# Patient Record
Sex: Male | Born: 1940 | Race: White | Hispanic: No | Smoking: Former smoker
Health system: Southern US, Community
[De-identification: ages and names within clinical notes are randomized; demographics above are authoritative.]

## PROBLEM LIST (undated history)

## (undated) DIAGNOSIS — I10 Essential (primary) hypertension: Secondary | ICD-10-CM

## (undated) DIAGNOSIS — I471 Supraventricular tachycardia, unspecified: Secondary | ICD-10-CM

## (undated) DIAGNOSIS — Z7901 Long term (current) use of anticoagulants: Secondary | ICD-10-CM

## (undated) DIAGNOSIS — I509 Heart failure, unspecified: Secondary | ICD-10-CM

## (undated) DIAGNOSIS — M1990 Unspecified osteoarthritis, unspecified site: Secondary | ICD-10-CM

## (undated) DIAGNOSIS — I82409 Acute embolism and thrombosis of unspecified deep veins of unspecified lower extremity: Secondary | ICD-10-CM

## (undated) DIAGNOSIS — Z87442 Personal history of urinary calculi: Secondary | ICD-10-CM

## (undated) DIAGNOSIS — I2699 Other pulmonary embolism without acute cor pulmonale: Secondary | ICD-10-CM

## (undated) DIAGNOSIS — K219 Gastro-esophageal reflux disease without esophagitis: Secondary | ICD-10-CM

## (undated) DIAGNOSIS — I429 Cardiomyopathy, unspecified: Secondary | ICD-10-CM

## (undated) DIAGNOSIS — E785 Hyperlipidemia, unspecified: Secondary | ICD-10-CM

## (undated) DIAGNOSIS — I2510 Atherosclerotic heart disease of native coronary artery without angina pectoris: Secondary | ICD-10-CM

## (undated) DIAGNOSIS — Z95810 Presence of automatic (implantable) cardiac defibrillator: Secondary | ICD-10-CM

## (undated) DIAGNOSIS — D329 Benign neoplasm of meninges, unspecified: Secondary | ICD-10-CM

## (undated) DIAGNOSIS — Z8711 Personal history of peptic ulcer disease: Secondary | ICD-10-CM

## (undated) DIAGNOSIS — I495 Sick sinus syndrome: Secondary | ICD-10-CM

## (undated) DIAGNOSIS — I739 Peripheral vascular disease, unspecified: Secondary | ICD-10-CM

## (undated) DIAGNOSIS — I6529 Occlusion and stenosis of unspecified carotid artery: Secondary | ICD-10-CM

## (undated) DIAGNOSIS — C4491 Basal cell carcinoma of skin, unspecified: Secondary | ICD-10-CM

## (undated) DIAGNOSIS — R51 Headache: Secondary | ICD-10-CM

## (undated) DIAGNOSIS — IMO0001 Reserved for inherently not codable concepts without codable children: Secondary | ICD-10-CM

## (undated) DIAGNOSIS — Z8719 Personal history of other diseases of the digestive system: Secondary | ICD-10-CM

## (undated) DIAGNOSIS — G4733 Obstructive sleep apnea (adult) (pediatric): Secondary | ICD-10-CM

## (undated) DIAGNOSIS — Z8679 Personal history of other diseases of the circulatory system: Secondary | ICD-10-CM

## (undated) DIAGNOSIS — R519 Headache, unspecified: Secondary | ICD-10-CM

## (undated) DIAGNOSIS — I4891 Unspecified atrial fibrillation: Secondary | ICD-10-CM

## (undated) DIAGNOSIS — G459 Transient cerebral ischemic attack, unspecified: Secondary | ICD-10-CM

## (undated) DIAGNOSIS — K2270 Barrett's esophagus without dysplasia: Secondary | ICD-10-CM

## (undated) DIAGNOSIS — I672 Cerebral atherosclerosis: Secondary | ICD-10-CM

## (undated) DIAGNOSIS — I480 Paroxysmal atrial fibrillation: Secondary | ICD-10-CM

## (undated) DIAGNOSIS — H538 Other visual disturbances: Secondary | ICD-10-CM

## (undated) HISTORY — PX: INGUINAL HERNIA REPAIR: SUR1180

## (undated) HISTORY — DX: Supraventricular tachycardia: I47.1

## (undated) HISTORY — DX: Other visual disturbances: H53.8

## (undated) HISTORY — DX: Obstructive sleep apnea (adult) (pediatric): G47.33

## (undated) HISTORY — PX: THROAT SURGERY: SHX803

## (undated) HISTORY — PX: CORONARY ANGIOPLASTY WITH STENT PLACEMENT: SHX49

## (undated) HISTORY — DX: Cerebral atherosclerosis: I67.2

## (undated) HISTORY — DX: Cardiomyopathy, unspecified: I42.9

## (undated) HISTORY — DX: Hyperlipidemia, unspecified: E78.5

## (undated) HISTORY — DX: Peripheral vascular disease, unspecified: I73.9

## (undated) HISTORY — DX: Other pulmonary embolism without acute cor pulmonale: I26.99

## (undated) HISTORY — DX: Sick sinus syndrome: I49.5

## (undated) HISTORY — DX: Essential (primary) hypertension: I10

## (undated) HISTORY — DX: Paroxysmal atrial fibrillation: I48.0

## (undated) HISTORY — PX: JOINT REPLACEMENT: SHX530

## (undated) HISTORY — DX: Supraventricular tachycardia, unspecified: I47.10

## (undated) HISTORY — DX: Atherosclerotic heart disease of native coronary artery without angina pectoris: I25.10

## (undated) HISTORY — DX: Transient cerebral ischemic attack, unspecified: G45.9

## (undated) HISTORY — PX: BASAL CELL CARCINOMA EXCISION: SHX1214

## (undated) HISTORY — PX: TONSILLECTOMY: SUR1361

## (undated) HISTORY — DX: Long term (current) use of anticoagulants: Z79.01

## (undated) HISTORY — DX: Barrett's esophagus without dysplasia: K22.70

## (undated) HISTORY — DX: Acute embolism and thrombosis of unspecified deep veins of unspecified lower extremity: I82.409

## (undated) HISTORY — DX: Benign neoplasm of meninges, unspecified: D32.9

## (undated) HISTORY — DX: Occlusion and stenosis of unspecified carotid artery: I65.29

## (undated) HISTORY — PX: CARDIAC CATHETERIZATION: SHX172

---

## 2002-05-22 ENCOUNTER — Inpatient Hospital Stay (HOSPITAL_COMMUNITY): Admission: RE | Admit: 2002-05-22 | Discharge: 2002-05-24 | Payer: Self-pay | Admitting: Cardiovascular Disease

## 2002-05-28 ENCOUNTER — Inpatient Hospital Stay (HOSPITAL_COMMUNITY): Admission: EM | Admit: 2002-05-28 | Discharge: 2002-05-30 | Payer: Self-pay

## 2003-03-23 ENCOUNTER — Encounter: Payer: Self-pay | Admitting: Emergency Medicine

## 2003-03-23 ENCOUNTER — Inpatient Hospital Stay (HOSPITAL_COMMUNITY): Admission: EM | Admit: 2003-03-23 | Discharge: 2003-03-25 | Payer: Self-pay | Admitting: Emergency Medicine

## 2003-07-17 HISTORY — PX: CATARACT EXTRACTION W/ INTRAOCULAR LENS  IMPLANT, BILATERAL: SHX1307

## 2003-11-24 ENCOUNTER — Inpatient Hospital Stay (HOSPITAL_COMMUNITY): Admission: EM | Admit: 2003-11-24 | Discharge: 2003-11-25 | Payer: Self-pay | Admitting: *Deleted

## 2003-12-30 ENCOUNTER — Ambulatory Visit (HOSPITAL_COMMUNITY): Admission: RE | Admit: 2003-12-30 | Discharge: 2003-12-30 | Payer: Self-pay | Admitting: Cardiology

## 2004-08-02 ENCOUNTER — Inpatient Hospital Stay (HOSPITAL_COMMUNITY): Admission: EM | Admit: 2004-08-02 | Discharge: 2004-08-04 | Payer: Self-pay

## 2004-08-22 ENCOUNTER — Emergency Department (HOSPITAL_COMMUNITY): Admission: EM | Admit: 2004-08-22 | Discharge: 2004-08-22 | Payer: Self-pay | Admitting: Emergency Medicine

## 2004-10-11 ENCOUNTER — Ambulatory Visit: Payer: Self-pay | Admitting: Internal Medicine

## 2004-10-12 ENCOUNTER — Ambulatory Visit: Payer: Self-pay | Admitting: Internal Medicine

## 2004-10-20 ENCOUNTER — Ambulatory Visit: Payer: Self-pay | Admitting: Internal Medicine

## 2004-12-06 ENCOUNTER — Ambulatory Visit: Payer: Self-pay | Admitting: Internal Medicine

## 2004-12-24 ENCOUNTER — Inpatient Hospital Stay (HOSPITAL_COMMUNITY): Admission: EM | Admit: 2004-12-24 | Discharge: 2004-12-27 | Payer: Self-pay | Admitting: Emergency Medicine

## 2007-02-20 ENCOUNTER — Ambulatory Visit (HOSPITAL_COMMUNITY): Admission: RE | Admit: 2007-02-20 | Discharge: 2007-02-20 | Payer: Self-pay | Admitting: Cardiovascular Disease

## 2007-05-19 ENCOUNTER — Ambulatory Visit (HOSPITAL_COMMUNITY): Admission: RE | Admit: 2007-05-19 | Discharge: 2007-05-19 | Payer: Self-pay | Admitting: Otolaryngology

## 2007-07-03 ENCOUNTER — Inpatient Hospital Stay (HOSPITAL_COMMUNITY): Admission: AD | Admit: 2007-07-03 | Discharge: 2007-07-04 | Payer: Self-pay | Admitting: Cardiovascular Disease

## 2007-10-31 ENCOUNTER — Emergency Department (HOSPITAL_COMMUNITY): Admission: EM | Admit: 2007-10-31 | Discharge: 2007-11-01 | Payer: Self-pay | Admitting: Emergency Medicine

## 2007-11-07 ENCOUNTER — Ambulatory Visit: Payer: Self-pay | Admitting: Endocrinology

## 2009-07-02 HISTORY — PX: CORONARY ANGIOPLASTY WITH STENT PLACEMENT: SHX49

## 2009-09-28 ENCOUNTER — Ambulatory Visit: Payer: Self-pay | Admitting: Internal Medicine

## 2009-09-28 ENCOUNTER — Inpatient Hospital Stay (HOSPITAL_COMMUNITY): Admission: RE | Admit: 2009-09-28 | Discharge: 2009-10-05 | Payer: Self-pay | Admitting: Orthopedic Surgery

## 2009-10-04 ENCOUNTER — Encounter (INDEPENDENT_AMBULATORY_CARE_PROVIDER_SITE_OTHER): Payer: Self-pay | Admitting: Orthopedic Surgery

## 2009-10-04 ENCOUNTER — Ambulatory Visit: Payer: Self-pay | Admitting: Vascular Surgery

## 2010-07-05 ENCOUNTER — Inpatient Hospital Stay (HOSPITAL_COMMUNITY)
Admission: EM | Admit: 2010-07-05 | Discharge: 2010-07-06 | Payer: Self-pay | Source: Home / Self Care | Attending: Cardiovascular Disease | Admitting: Cardiovascular Disease

## 2010-07-06 ENCOUNTER — Encounter (INDEPENDENT_AMBULATORY_CARE_PROVIDER_SITE_OTHER): Payer: Self-pay | Admitting: Cardiovascular Disease

## 2010-09-14 DIAGNOSIS — I82409 Acute embolism and thrombosis of unspecified deep veins of unspecified lower extremity: Secondary | ICD-10-CM

## 2010-09-14 DIAGNOSIS — I2699 Other pulmonary embolism without acute cor pulmonale: Secondary | ICD-10-CM

## 2010-09-14 HISTORY — PX: TOTAL KNEE ARTHROPLASTY: SHX125

## 2010-09-14 HISTORY — DX: Other pulmonary embolism without acute cor pulmonale: I26.99

## 2010-09-14 HISTORY — DX: Acute embolism and thrombosis of unspecified deep veins of unspecified lower extremity: I82.409

## 2010-09-25 LAB — DIFFERENTIAL
Basophils Absolute: 0 10*3/uL (ref 0.0–0.1)
Eosinophils Absolute: 0 10*3/uL (ref 0.0–0.7)
Eosinophils Relative: 1 % (ref 0–5)
Lymphocytes Relative: 28 % (ref 12–46)
Lymphs Abs: 1 10*3/uL (ref 0.7–4.0)

## 2010-09-25 LAB — CARDIAC PANEL(CRET KIN+CKTOT+MB+TROPI)
CK, MB: 1.2 ng/mL (ref 0.3–4.0)
CK, MB: 1.4 ng/mL (ref 0.3–4.0)
Relative Index: INVALID (ref 0.0–2.5)
Total CK: 72 U/L (ref 7–232)
Troponin I: 0.01 ng/mL (ref 0.00–0.06)

## 2010-09-25 LAB — COMPREHENSIVE METABOLIC PANEL
ALT: 19 U/L (ref 0–53)
Albumin: 3.8 g/dL (ref 3.5–5.2)
CO2: 26 mEq/L (ref 19–32)
Chloride: 104 mEq/L (ref 96–112)
Creatinine, Ser: 0.78 mg/dL (ref 0.4–1.5)
GFR calc Af Amer: >60 mL/min (ref >60)
Glucose, Bld: 96 mg/dL (ref 70–99)
Potassium: 3.7 mEq/L (ref 3.5–5.1)
Sodium: 137 mEq/L (ref 135–145)

## 2010-09-25 LAB — CBC
HCT: 36 % — ABNORMAL LOW (ref 39.0–52.0)
Hemoglobin: 12.4 g/dL — ABNORMAL LOW (ref 13.0–17.0)
MCH: 32.8 pg (ref 26.0–34.0)
MCV: 95.2 fL (ref 78.0–100.0)

## 2010-09-25 LAB — POCT CARDIAC MARKERS
CKMB, poc: 1 ng/mL (ref 1.0–8.0)
Myoglobin, poc: 48.3 ng/mL (ref 12–200)
Troponin i, poc: <0.05 ng/mL (ref 0.00–0.09)
Troponin i, poc: <0.05 ng/mL (ref 0.00–0.09)

## 2010-09-25 LAB — CK TOTAL AND CKMB (NOT AT ARMC)
Relative Index: INVALID (ref 0.0–2.5)
Total CK: 86 U/L (ref 7–232)

## 2010-09-25 LAB — PROTIME-INR
INR: 2.33 — ABNORMAL HIGH (ref 0.00–1.49)
Prothrombin Time: 25.7 seconds — ABNORMAL HIGH (ref 11.6–15.2)
Prothrombin Time: 27.5 seconds — ABNORMAL HIGH (ref 11.6–15.2)

## 2010-09-25 LAB — BRAIN NATRIURETIC PEPTIDE: Pro B Natriuretic peptide (BNP): 97 pg/mL (ref 0.0–100.0)

## 2010-10-09 LAB — CBC
HCT: 25.8 % — ABNORMAL LOW (ref 39.0–52.0)
HCT: 26.8 % — ABNORMAL LOW (ref 39.0–52.0)
HCT: 26.9 % — ABNORMAL LOW (ref 39.0–52.0)
HCT: 27.4 % — ABNORMAL LOW (ref 39.0–52.0)
HCT: 28.1 % — ABNORMAL LOW (ref 39.0–52.0)
HCT: 42.3 % (ref 39.0–52.0)
Hemoglobin: 10.1 g/dL — ABNORMAL LOW (ref 13.0–17.0)
Hemoglobin: 13.6 g/dL (ref 13.0–17.0)
Hemoglobin: 8.4 g/dL — ABNORMAL LOW (ref 13.0–17.0)
Hemoglobin: 9 g/dL — ABNORMAL LOW (ref 13.0–17.0)
Hemoglobin: 9 g/dL — ABNORMAL LOW (ref 13.0–17.0)
Hemoglobin: 9 g/dL — ABNORMAL LOW (ref 13.0–17.0)
Hemoglobin: 9.4 g/dL — ABNORMAL LOW (ref 13.0–17.0)
MCHC: 32.2 g/dL (ref 30.0–36.0)
MCHC: 32.7 g/dL (ref 30.0–36.0)
MCHC: 33 g/dL (ref 30.0–36.0)
MCHC: 33 g/dL (ref 30.0–36.0)
MCHC: 33.6 g/dL (ref 30.0–36.0)
MCHC: 33.7 g/dL (ref 30.0–36.0)
MCHC: 33.7 g/dL (ref 30.0–36.0)
MCV: 100.8 fL — ABNORMAL HIGH (ref 78.0–100.0)
MCV: 100.8 fL — ABNORMAL HIGH (ref 78.0–100.0)
MCV: 101.7 fL — ABNORMAL HIGH (ref 78.0–100.0)
MCV: 101.7 fL — ABNORMAL HIGH (ref 78.0–100.0)
MCV: 101.8 fL — ABNORMAL HIGH (ref 78.0–100.0)
MCV: 101.8 fL — ABNORMAL HIGH (ref 78.0–100.0)
MCV: 99.9 fL (ref 78.0–100.0)
Platelets: 119 10*3/uL — ABNORMAL LOW (ref 150–400)
Platelets: 131 10*3/uL — ABNORMAL LOW (ref 150–400)
Platelets: 157 10*3/uL (ref 150–400)
Platelets: 176 10*3/uL (ref 150–400)
Platelets: 184 10*3/uL (ref 150–400)
RBC: 2.53 MIL/uL — ABNORMAL LOW (ref 4.22–5.81)
RBC: 2.64 MIL/uL — ABNORMAL LOW (ref 4.22–5.81)
RBC: 2.69 MIL/uL — ABNORMAL LOW (ref 4.22–5.81)
RBC: 2.72 MIL/uL — ABNORMAL LOW (ref 4.22–5.81)
RBC: 2.78 MIL/uL — ABNORMAL LOW (ref 4.22–5.81)
RDW: 15.5 % (ref 11.5–15.5)
RDW: 15.6 % — ABNORMAL HIGH (ref 11.5–15.5)
RDW: 15.8 % — ABNORMAL HIGH (ref 11.5–15.5)
RDW: 15.8 % — ABNORMAL HIGH (ref 11.5–15.5)
WBC: 4.8 10*3/uL (ref 4.0–10.5)
WBC: 5.1 10*3/uL (ref 4.0–10.5)
WBC: 6.2 10*3/uL (ref 4.0–10.5)
WBC: 6.5 10*3/uL (ref 4.0–10.5)
WBC: 8.9 10*3/uL (ref 4.0–10.5)

## 2010-10-09 LAB — BASIC METABOLIC PANEL
BUN: 10 mg/dL (ref 6–23)
BUN: 10 mg/dL (ref 6–23)
BUN: 12 mg/dL (ref 6–23)
BUN: 9 mg/dL (ref 6–23)
CO2: 26 mEq/L (ref 19–32)
CO2: 26 mEq/L (ref 19–32)
CO2: 27 mEq/L (ref 19–32)
CO2: 27 mEq/L (ref 19–32)
CO2: 28 mEq/L (ref 19–32)
CO2: 28 mEq/L (ref 19–32)
Calcium: 7.6 mg/dL — ABNORMAL LOW (ref 8.4–10.5)
Calcium: 7.6 mg/dL — ABNORMAL LOW (ref 8.4–10.5)
Calcium: 7.6 mg/dL — ABNORMAL LOW (ref 8.4–10.5)
Calcium: 8.2 mg/dL — ABNORMAL LOW (ref 8.4–10.5)
Chloride: 103 mEq/L (ref 96–112)
Chloride: 104 mEq/L (ref 96–112)
Chloride: 106 mEq/L (ref 96–112)
Chloride: 106 mEq/L (ref 96–112)
Creatinine, Ser: 0.64 mg/dL (ref 0.4–1.5)
Creatinine, Ser: 0.87 mg/dL (ref 0.4–1.5)
GFR calc Af Amer: >60 mL/min (ref >60)
GFR calc Af Amer: >60 mL/min (ref >60)
GFR calc Af Amer: >60 mL/min (ref >60)
GFR calc Af Amer: >60 mL/min (ref >60)
GFR calc non Af Amer: >60 mL/min (ref >60)
GFR calc non Af Amer: >60 mL/min (ref >60)
GFR calc non Af Amer: >60 mL/min (ref >60)
Glucose, Bld: 103 mg/dL — ABNORMAL HIGH (ref 70–99)
Glucose, Bld: 147 mg/dL — ABNORMAL HIGH (ref 70–99)
Glucose, Bld: 97 mg/dL (ref 70–99)
Potassium: 3.6 mEq/L (ref 3.5–5.1)
Potassium: 3.6 mEq/L (ref 3.5–5.1)
Potassium: 4.2 mEq/L (ref 3.5–5.1)
Potassium: 4.2 mEq/L (ref 3.5–5.1)
Sodium: 137 mEq/L (ref 135–145)
Sodium: 138 mEq/L (ref 135–145)
Sodium: 138 mEq/L (ref 135–145)
Sodium: 141 mEq/L (ref 135–145)

## 2010-10-09 LAB — COMPREHENSIVE METABOLIC PANEL
Albumin: 3.6 g/dL (ref 3.5–5.2)
BUN: 10 mg/dL (ref 6–23)
Calcium: 9.2 mg/dL (ref 8.4–10.5)
Creatinine, Ser: 0.8 mg/dL (ref 0.4–1.5)
Glucose, Bld: 101 mg/dL — ABNORMAL HIGH (ref 70–99)
Potassium: 4.4 mEq/L (ref 3.5–5.1)
Total Protein: 6.4 g/dL (ref 6.0–8.3)

## 2010-10-09 LAB — PROTIME-INR
INR: 0.98 (ref 0.00–1.49)
INR: 1.25 (ref 0.00–1.49)
INR: 2.02 — ABNORMAL HIGH (ref 0.00–1.49)
INR: 2.4 — ABNORMAL HIGH (ref 0.00–1.49)
INR: 2.49 — ABNORMAL HIGH (ref 0.00–1.49)
INR: 2.97 — ABNORMAL HIGH (ref 0.00–1.49)
Prothrombin Time: 12.9 seconds (ref 11.6–15.2)
Prothrombin Time: 22.7 seconds — ABNORMAL HIGH (ref 11.6–15.2)
Prothrombin Time: 25 seconds — ABNORMAL HIGH (ref 11.6–15.2)
Prothrombin Time: 26.7 seconds — ABNORMAL HIGH (ref 11.6–15.2)
Prothrombin Time: 30.7 seconds — ABNORMAL HIGH (ref 11.6–15.2)

## 2010-10-09 LAB — URINALYSIS, ROUTINE W REFLEX MICROSCOPIC
Bilirubin Urine: NEGATIVE
Glucose, UA: NEGATIVE mg/dL
Ketones, ur: NEGATIVE mg/dL
Protein, ur: NEGATIVE mg/dL

## 2010-10-09 LAB — TYPE AND SCREEN
ABO/RH(D): O POS
Antibody Screen: NEGATIVE

## 2010-10-09 LAB — TSH: TSH: 1.779 u[IU]/mL (ref 0.350–4.500)

## 2010-10-09 LAB — APTT: aPTT: 26 seconds (ref 24–37)

## 2010-10-30 NOTE — Discharge Summary (Signed)
  NAMETERRIE, GRAJALES               ACCOUNT NO.:  [id number]  MEDICAL RECORD NO.:  [id number]          PATIENT TYPE:  INP  LOCATION:  2038                         FACILITY:  [facility]  PHYSICIAN:  Thurmon Fair, MD     DATE OF BIRTH:  [birth year]  DATE OF ADMISSION:  07/05/2010 DATE OF DISCHARGE:  07/06/2010                              DISCHARGE SUMMARY   DISCHARGE DIAGNOSES: 1. Chest pain.  Negative myocardial infarction.  Negative Myoview     stress test. 2. Complex history of coronary artery disease with multiple     interventions and stents most recent in 2008. 3. History of pulmonary embolus on Coumadin. 4. History of Barrett esophagus. 5. Obstructive sleep apnea with CPAP. 6. Chronic obstructive pulmonary disease.  DISCHARGE CONDITION:  Improved.  PROCEDURES:  None.  DISCHARGE MEDICATIONS:  See medication reconciliation sheet from [facility].  DISCHARGE INSTRUCTIONS: 1. Increase activity slowly. 2. Low-sodium heart-healthy diet. 3. Follow up with Dr. Allyson Sabal on July 11, 2010, at 8:45.  The patient was admitted July 05, 2010, by Dr. Royann Shivers secondary to complaints of shortness of breath, New York Heart classification type 2 last 2 months and some problems with back pain, mostly in the right parascapular area over the last few days prior to admission.  On the day of admission, he had persistent retrosternal chest discomfort.  He described this pointing what he calls like to the lower third of the sternum.  Despite that he was able to exercise using both an elliptical and treadmill and did not notice any worsening of the symptoms during exercise.  The symptoms did not become particularly worse during the day, but he became oriented to the emergency room.  His blood pressure in the ER was slightly elevated at 140/70, he was placed on IV nitro despite his taking daily Cialis for erectile dysfunction.  His blood pressure did come down to 110/70 and did not feel, but a  little relief in his chest.  He was admitted to the hospital with plans to hold his Coumadin and underwent a stress Myoview, which was negative for ischemia.  He was seen by Dr. Allyson Sabal and felt stable to be discharged home.  Cardiac enzymes were negative.  LABORATORY DATA:  Hemoglobin 12.4, hematocrit 36, WBC 3.7, platelets 163.  Protime 25.7, INR 2.3.  Sodium 137, potassium 3.7, chloride 104, CO2 of 26, glucose 96, BUN 11, creatinine 0.78, total protein 6.3, albumin 3.8, AST 24, ALT 19, alkaline phos 57, total bili 0.6.  CKs were negative with CK 86, MB of 1.5, troponin 0.01 x3.  EKG is sinus rhythm with occasional PAC.  No acute changes.  The patient was discharged on July 06, 2010, with followup as instructed.     Darcella Gasman. Annie Paras, N.P.   ______________________________ Thurmon Fair, MD    LRI/MEDQ  D:  10/03/2010  T:  10/04/2010  Job:  [id number]  cc:   Nanetta Batty, M.D.  Electronically Signed by Nada Boozer N.P. on 10/05/2010 11:44:02 AM Electronically Signed by Thurmon Fair M.D. on 10/30/2010 03:56:11 PM

## 2010-11-28 NOTE — Cardiovascular Report (Signed)
NAMEDREUX, MCGROARTY NO.:  [id number]   MEDICAL RECORD NO.:  [id number]          PATIENT TYPE:  INP   LOCATION:  6529                         FACILITY:  [facility]   PHYSICIAN:  Nanetta Batty, M.D.   DATE OF BIRTH:  March 02, 1941   DATE OF PROCEDURE:  DATE OF DISCHARGE:                            CARDIAC CATHETERIZATION   HISTORY OF PRESENT ILLNESS:  Mr. Brallier is a 70 year old of some of the  history of ischemic heart disease, multivessel PCI and stenting with  drug-eluting stents in the past. Past history includes hypertension,  hyperlipidemia and erectile dysfunction.  I catheterized him December 26, 2004, revealing normal LV function with patent stents except for a  jailed OM stent which had 80% in-stent restenosis.  This vessel had been  attempted to be intervened on, but the wire was never able to pass the  jailed segment.  He had 50% stenoses in the proximal and mid RCA.  Myoview stress test performed July 2008 showed inferior ischemia.  He is  complaining of atypical chest and throat pain as well as progressive  dyspnea on exertion for the last several weeks.  He presents now for  diagnostic coronary arteriography to rule out ischemic etiology and  potential intervention.   DESCRIPTION OF PROCEDURE:  The patient brought to the second floor [facility]  of cardiac cath lab in the postabsorptive state.  He was premedicated  with p.o. Valium.  His right groin was prepped and shaved in the usual  sterile fashion, and 1% Xylocaine was used for local anesthesia.  A 6-  French sheath was inserted into the right femoral artery using standard  Seldinger technique.  A 6-French right and left Judkins diagnostic  catheter as well as 6-French pigtail catheter and 5-French JR-4 catheter  were used for selective cholangiography, left ventriculography and  distal abdominal aortography, Visipaque dye was used entirety of the  case.  Retrograde aortic, left ventricular blood pressures  were  recorded.   HEMODYNAMICS:  1. Aortic systolic pressure 120, diastolic pressure 69.  2. Left ventricle systolic pressure 140, end-diastolic pressure 9.   SELECTIVE CHOLANGIOGRAPHY:  1. Left main normal.  2. LAD; LAD had a 40% segmental proximal stenosis.  Proximal LAD stent      was widely patent.  He had a 50% stenosis just after the stented      segment.  3. Left circumflex; this distal circumflex was widely patent.  The OM      stent which was jailed by the distal circumflex stent had 80% in-      stent restenosis unchanged from prior angiogram.  4. Right coronary; large dominant vessel with damping using both 5-      and 6-French JR-4 guide catheter.  The proximal and mid stents were      widely patent.  5. Left ventriculography; RAO left ventriculogram was performed using      25 mL of Visipaque dye at 12 mL per second.  The overall LVEF was      estimated greater than 60% with focal wall motion abnormalities.  6. Distal abdominal aortography;  distal abdominal aortogram was      performed using 20 mL of Visipaque dye at 20 mL per second.  Renal      arteries widely patent.  Infrarenal abdominal aorta and iliac      bifurcation appear free of significant atherosclerotic changes.   IMPRESSION:  Mr. Vicario has moderate disease in the ostium/proximal  portion of his dominant right.  With previously documented ischemia by  functional imaging and damping using a 5-French catheter, will proceed  with IVUS guided PCI and stenting using drug-eluting stent and Angiomax.   The patient was already on aspirin, Plavix.  He received an additional  600 mg of p.o. Plavix and Angiomax bolus, an ACT of greater than 300.   Using a 6-French JR-4 with side holes along with an OM4-190 Asahi soft  wire and a 3.2 [place] Galaxy IVUS catheter intravascular ultrasound was  performed.  This revealed a large plaque burden in the proximal portion  of the dominant right.  Reference segment measured  4.13 x 4.3 mm with a  lesion area of 3.17 mm.  Based on this, it was decided to proceed with  intervention.   Using a 03/05 by 16 Taxus Liberte drug-eluting stent, primary stenting  was performed.  This was carefully positioned both angiographically and  fluoroscopically.  The ostium of the right coronary artery deployed at  16 atmospheres.  It was postdilated with a 4 x 15 Dura Star noncompliant  balloon and 16 atmospheres resulting reduction of a 60% hypodense  ostial/proximal lesion to 0% residual.  The patient tolerated procedure  well.  No hemodynamic electrocardiograph sequelae.   IMPRESSION:  Mr. Lennartz had successful IVUS guided proximal dominant  right PCI stenting using Angiomax and Taxus Liberte drug-eluting stent  with excellent angiographic result.  The patient tolerated the procedure  well.  The guidewire and catheter were removed.  The patient left lab in  stable condition.   PLAN:  Plans will be to treat him with aspirin and Plavix, discharge him  home tomorrow.  He will see me back in the office in 1-2 weeks for  follow up.    Left lab in stable condition.      Nanetta Batty, M.D.  Electronically Signed     JB/MEDQ  D:  07/03/2007  T:  07/03/2007  Job:  [id number]   cc:   [facility]  [facility]  Richard A. Alanda Amass, M.D.  Alan Mulder, M.D.

## 2010-12-01 NOTE — Discharge Summary (Signed)
NAMEGURLEY, CLIMER                           ACCOUNT NO.:  [id number]   MEDICAL RECORD NO.:  [id number]                   PATIENT TYPE:  INP   LOCATION:  2018                                 FACILITY:  [facility]   PHYSICIAN:  Richard A. Alanda Amass, M.D.          DATE OF BIRTH:  1941/04/08   DATE OF ADMISSION:  05/21/2002  DATE OF DISCHARGE:  05/24/2002                                 DISCHARGE SUMMARY   HOSPITAL COURSE:  The patient is a 70 year old white married male patient  who was initially seen in the office per referral of Dr. Alan Mulder.  He had a history of some shortness of breath.  Our office notes say chest  pain with exertion.  He was seen by Dr. Pearletha Furl. Alanda Amass and it was  decided that he should undergo 2D echocardiogram outpatient and an  outpatient left and right heart catheterization.  His 2D echocardiogram came  back with a low EF.  He came into the hospital as an outpatient for cardiac  catheterization right and left.  This was performed on May 21, 2002 by  Dr. Susa Griffins.  He had an ulcerative plague in his RCA greater than  70%.  He had an IV ultrasound of that lesion.  He had an LAD lesion with  calcium and he also had a IV ultrasound of that lesion.  It was noted to be  60 to 70%.  He had a high grade circumflex at the bifurcation, 85 to 90 and  90% in his OM-2.  He underwent stenting with an express stent to his  proximal RCA lesion.  Please see Dr. Kandis Cocking note for complete details.  It was planned that he would undergo staged circumflex OM lesion PCI in the  future.  He then had some bursts of NSVT on May 22, 2002.  It was  decided to keep him in the hospital and to increase his Coreg and this was  done.  The following day, May 23, 2002  he had no more bursts of NSVT.  He did have frequent PVCs and some PACs thus it was decided to do an EP  consult.  He was seen by Dr. Gilman Schmidt on May 23, 2002 who  recommended  without scar on Cardiolite, a segmental wall motion abnormality  on 2D echocardiogram or a clear infarct on 12 lead EKG, he would not  recommend testing or AICD at the present time.  He suggested medical therapy  with beta blocker and ACE.  On May 24, 2002 he again had no further  bursts of NSVT.  It was decided he was stable and could be discharged home.  He will follow up with Dr. Susa Griffins early in the office for a  possible staged procedure of his circumflex artery.   LABORATORY DATA:  May 24, 2002 hemoglobin 13.1, hematocrit 38.1, WBC  5.4, and platelets 171.  INR was 0.8  on admission.  PTT was 25.  His sodium  was 136, potassium 3.9, BUN 20, creatinine 1.0.  His total cholesterol was  150, triglycerides 87, HDL 58, LDL was 85.  His DLDL was 17.   Chest x-ray not in the chart at the time of this dictation.   DISCHARGE MEDICATIONS:  1. Lipitor 20 mg one q.d.  2. Altace 5 mg one q.d.  3. Lasix 20 mg one q.d.  4. Aldactone 12.5 mg one q.d.  5. Enteric coated aspirin 81 mg one q.d.  6. Plavix 75 mg one q.d.  7. Coreg 6.25 mg one q.d.  8. Protonix 40 mg one q.d.  9. He may continue his vitamins except for his aminoacids and he was     recommended not to take vitamin E.  10.      Nitroglycerin 0.4 mg, he may use under his tongue every five     minutes times three for chest pain.   ACTIVITY:  He is to do no strenuous activity.  No lifting x 2 days.   DIET:  He should be on a low salt diet.   SPECIAL INSTRUCTIONS:  If he has any problems with his groin he will visit  our office or call.   FOLLOW UP:  He will follow up with Dr. Alanda Amass on Monday at 3:45.  We will  check him to see if this appointment is necessary.  I am unsure if this is  an appointment previously made prior to his hospitalization.   His blood pressure on the day of discharge is 128/72.  His heart rate is 74.  His respirations are 20.  He was in sinus rhythm to sinus bradycardia.   DISCHARGE  DIAGNOSES:  1. Coronary artery disease status post stent to ulcerated plaque in his     right coronary artery using the Express stent with positive residual high     grade circumflex disease in his circumflex and obtuse marginal 2 vessel,     also borderline left anterior descending artery lesion proximal.  2. Nonsustained ventricular tachycardia.  Electrophysiology consult done on     May 23, 2002 no recommendations for electrophysiology testing or     automatic Implantable cardioverter-defibrillator at this time without     previous myocardial infarction or scar on Cardiolite.  3. Cardiomyopathy seemed out of proportion to his coronary artery disease     though he does have some three vessel disease.  His ejection fraction is     20 to 25%.  4. Dyslipidemia.  Low-density lipoprotein less than 100, currently on     Lipitor.  5. Abnormal chest x-ray.  He has undergone a computed tomography scan as an     outpatient that showed no mediastinal or lymphadenopathy.  He did have     pleural plaques and no masses.  6. Hypertension.         Lezlie Octave, N.P.                        Richard A. Alanda Amass, M.D.    BB/MEDQ  D:  05/24/2002  T:  05/24/2002  Job:  [id number]   cc:   Alan Mulder, M.D.  [address]  [place]  [place] [address]  Fax: [phone]

## 2010-12-01 NOTE — H&P (Signed)
NAME:  ZAKYE, BABY NO.:  [id number]   MEDICAL RECORD NO.:  [id number]          PATIENT TYPE:  EMS   LOCATION:  MAJO                         FACILITY:  [facility]   PHYSICIAN:  Ulyses Amor, MD DATE OF BIRTH:  15-Mar-1941   DATE OF ADMISSION:  08/01/2004  DATE OF DISCHARGE:                                HISTORY & PHYSICAL   HISTORY OF PRESENT ILLNESS:  [name] is a 70 year old white man who  was admitted to [facility] for further evaluation of chest pain.   The patient has a history of coronary artery disease, which dates back to  November of 2003.  At that time, he received a right coronary artery stent  and a circumflex stent at the second obtuse marginal bifurcation.  In May of  2005, he received an LAD stent proximally after the first diagonal.  The  previously-placed circumflex and OM-2 bifurcation stenting was patent, as  was the right coronary artery stent.   He returned tonight after experiencing an episode of chest pain yesterday  and today.  Both episodes were similar in character and length.  The chest  pain occurred while he was at rest.  It was described as a fullness in his  epigastrium, substernal region, and throat.  It did not radiate.  It was not  associated with dyspnea, diaphoresis, or nausea.  Each episode lasted  several hours and resolved spontaneously.  He is unsure as to whether this  discomfort is similar to his prior cardiac chest pain.  He is asymptomatic  at this time.  There were no other exacerbating or ameliorating factors.  The discomfort appeared not to be related to position, activity, meals, or  respirations.   The patient has no history of myocardial infarction, congestive heart  failure, or arrhythmia.  His ejection fraction is normal; it was estimated  to be 55% to 65% at the time of his last cardiac catheterization.  The  patient has a history of hypertension and dyslipidemia.  He does not smoke.  He  discontinued smoking in 2001.  There is no family history of coronary  artery disease.   The patient has a history of gastroesophageal reflux with hiatal hernia, as  well as a history of __________.   SOCIAL HISTORY:  The patient is a retired Theatre stage manager.  He lives with his  wife.  He is physically active.  He drinks occasional alcohol.   FAMILY HISTORY:  His mother died in her 50s of congestive heart failure.  His father died of suicide.   MEDICATIONS:  1.  Aspirin 325 mg p.o. daily.  2.  Plavix 75 mg p.o. daily.  3.  Lipitor 40 mg p.o. daily.  4.  Altace 5 mg p.o. daily.  5.  Protonix 40 mg daily.  6.  Spironolactone 25 mg p.o. daily.  7.  Furosemide 20 mg p.o. daily.  8.  Coreg 12.5 mg p.o. b.i.d.   ALLERGIES:  None.   OPERATIONS:  1.  Left knee surgery.  2.  Left inguinal hernia repair.   REVIEW OF SYSTEMS:  No new problems related to his head, eyes, ears, nose,  mouth, throat, lungs, gastrointestinal, genitourinary system, or  extremities.  There is no history of neurologic or psychiatric disorder.  There is no history of fever, chills, or weight loss.   PHYSICAL EXAMINATION:  VITAL SIGNS:  Blood pressure 146/79, pulse 58 and  regular, respirations 18, temperature 97.8.  GENERAL:  The patient was an older white man in no discomfort.  He was  alert, oriented, appropriate, and responsive.  HEENT:  Normal.  NECK:  The neck was without thyromegaly or adenopathy.  Carotid pulses were  palpable bilaterally and without bruits.  CARDIAC:  Normal S1 and S3.  There was no S3, S4, murmur, rub, or click.  Cardiac rhythm was regular.  CHEST:  No chest wall tenderness was noted.  LUNGS:  Clear.  ABDOMEN:  Soft and nontender.  There was no mass, hepatosplenomegaly, bruit,  distention, rebound, guarding, or rigidity.  Bowel sounds were normal.  RECTAL/GENITAL:  Not performed, as they were not pertinent to the reason for  acute care hospitalization.  EXTREMITIES:  Without edema,  deviation, or deformity.  Radial and dorsalis  pedis pulses were palpable bilaterally.  NEUROLOGIC:  Brief screening neurologic survey was unremarkable.   The electrocardiogram reveals sinus bradycardia with occasional PVC's.  T  wave inversion was present in leads III and aVF.  The chest radiograph,  according to the radiologist, demonstrated no evidence of acute disease.  The initial set of cardiac markers revealed a myoglobin of 8.7, CK-MB 1.7,  and troponin less than 0.05.  White count was 3.4, with a hemoglobin of  11.6, and hematocrit of 32.7.  The remaining studies were pending at the  time of this dictation.   IMPRESSION:  1.  Chest pain.  Rule out unstable angina.  2.  Coronary artery disease, status post right coronary artery and      circumflex/OM-2 stent in November of 2003.  Status post left anterior      descending stent in May of 2005.  3.  Dyslipidemia.  4.  Hypertension.  5.  Asbestosis.  6.  Gastroesophageal reflux with hiatal hernia.  7.  Anemia.   PLAN:  1.  Telemetry.  2.  Serial cardiac enzymes.  3.  Aspirin; continue Plavix.  4.  Intravenous heparin.  5.  Intravenous nitroglycerin.  6.  Further measures per Dr. Alanda Amass.      [facility]   MSC/MEDQ  D:  08/02/2004  T:  08/02/2004  Job:  [id number]   cc:   Gerlene Burdock A. Alanda Amass, M.D.  [phone] [address], [address]  [place]  [place] [address]  Fax: [phone]

## 2010-12-01 NOTE — Cardiovascular Report (Signed)
[name], [name]                           ACCOUNT NO.:  [id number]   MEDICAL RECORD NO.:  [id number]                   PATIENT TYPE:  OIB   LOCATION:  2899                                 FACILITY:  [facility]   PHYSICIAN:  Thereasa Solo. Little, M.D.              DATE OF BIRTH:  1940-12-28   DATE OF PROCEDURE:  12/30/2003  DATE OF DISCHARGE:  12/30/2003                              CARDIAC CATHETERIZATION   INDICATIONS FOR TEST:  Mr. Glance is a 70 year old male who has had multiple  percutaneous interventions.  His last one was May 2005.  He presented to the  office with two night history of waking up with marked breathlessness.  He  has had some upper back pain and some mild awareness of his chest and  describes this as being very comparable to what he had in the past as his  anginal equivalent.   His EKG showed no significant changes and because of this arrangements were  made for him to come in as an outpatient for cardiac catheterization.   PROCEDURE:  The patient was prepped and draped in the usual sterile fashion  exposing the right groin. Following local anesthetic with 1% Xylocaine, the  Seldinger technique was performed and a 5 French introducer sheath was  placed into the right femoral artery.  Left and right coronary arteriography  and ventriculography in the RAO projection was performed.   EQUIPMENT:  5 French Judkins configuration catheters.   TOTAL CONTRAST:  90 mL.   COMPLICATIONS:  None.   RESULTS:   HEMODYNAMIC MONITORING:  Central aortic pressure was 117/67.  Left  ventricular pressure was 115/9.  There was no significant valve gradient  noted at the time of pullback.   VENTRICULOGRAPHY:  Ventriculography in the RAO projection revealed mild  global LV dysfunction with ejection fraction low normal of around 45-50%.  No significant focal wall motion abnormalities were noted.   CORONARY ARTERIOGRAPHY:  1. Left main normal. It bifurcated.  2. LAD:  The left  anterior descending extended down towards the apex of the     heart and in the mid portion it bifurcated into a very large ongoing     diagonal and a very large ongoing LAD.  In the proximal portion of the     LAD was a 3.5 x 16 Taxus stent.  The stent was widely patent.  There was     50-60% narrowing of the ostium of a small diagonal that came off within     the stent.  There were minor irregularities in the LAD, but no high grade     stenosis.  3. Optional diagonal:  Medium size vessel.  No significant disease.  4. Circumflex:  The circumflex was a large vascular system.  At the     bifurcation of a large OM vessel and the ongoing circumflex that extended     into another OM vessel  were three stents.  In OM-1 there was a 2.5 x 12     Taxus stent.  It was patent and the OM was free of disease.  In the     ongoing circumflex was a 2.5 x 13 Cypher stent.  The stent was patent and     the ongoing vessel was free of significant disease.  Proximal to both of     these stents, but joining those stents, was a 3.0 x 9 Zipper stent.     There were mild irregularities in the proximal portion of the vessel, but     no high grade stenosis.  The most significant narrowing was around 40%.  5. Right coronary artery:  Right coronary artery was a dominant vessel.  It     was about 4 mm proximally.  In the mid portion of the vessel was a 4.5 x     12 stent.  There was some minor 30 and 40% narrowings around the stent,     but no high grade stenosis.  The PDA had ostial 50-60% narrowing and this     was clearly unchanged from previous cardiac catheterizations.  The     ongoing posterior lateral branch was relatively large and had no     significant obstructions.   CONCLUSION:  Widely patent stents in all coronary arteries.  Only mild  disease.  The biggest concern is the 60% or less stenosis in the ostium of  the PDA which appears to have been present on prior caths, particularly May  2005.   I had Dr.  Elsie Lincoln review the films who did not feel that there was high grade  stenoses.   The patient continued to have symptoms.  An outpatient stress Cardiolite  will be indicated and if ischemia was demonstrated in the distribution of  the PDA this would be amenable to intervention, but because of its ostial  location would potentially jeopardize the ongoing posterior lateral branch.  I am not convinced that his pain is cardiac at this point.   He will be discharged to home today.  I plan to re-evaluate him in the  office tomorrow to make sure his groin/cath site is well healed.  Regular  followup will be with Dr. Alanda Amass.                                               Thereasa Solo. Little, M.D.    ABL/MEDQ  D:  12/30/2003  T:  12/31/2003  Job:  [id number]   cc:   Gerlene Burdock A. Alanda Amass, M.D.  [phone]  [place]  [place] [address]  Fax: [phone]

## 2010-12-01 NOTE — H&P (Signed)
[name], [name]                           ACCOUNT NO.:  [id number]   MEDICAL RECORD NO.:  [id number]                   PATIENT TYPE:  INP   LOCATION:  3732                                 FACILITY:  [facility]   PHYSICIAN:  Richard A. Alanda Amass, M.D.          DATE OF BIRTH:  1941-01-09   DATE OF ADMISSION:  05/28/2002  DATE OF DISCHARGE:                                HISTORY & PHYSICAL   CHIEF COMPLAINT:  Left arm and throat pain.   HISTORY OF PRESENT ILLNESS:  The patient is a 70 year old male who was  referred to Dr. Pearletha Furl. Alanda Amass by Dr. Alan Mulder for  evaluation of shortness of breath.  Dr. Alan Mulder had performed  some preliminary testing including a Cardiolite scan that showed LV  dysfunction.  The patient was seen by Dr. Pearletha Furl. Alanda Amass and set up  for an echocardiogram which confirmed significant LV dysfunction with an EF  of 20-25%.   Catheterization was done and revealed coronary disease.  He had an 70-80%  RCA narrowing that was looked at with IVUS.  He underwent stenting with an  Express stent.  He also had a residual 80-90% circumflex and 90% OM2 and a  60-70% calcified LAD lesion.  The plan was to bring him back for staged  intervention of his circumflex and OM2.  He did have some nonsustained  ventricular tachycardia in the hospital at that time and was seen by Dr.  Doylene Canning. Ladona Ridgel.  Dr. Doylene Canning. Ladona Ridgel felt that he had a nonischemic  cardiomyopathy.  There was no segmental wall motion abnormality on his  echocardiogram and no scar on his Cardiolite study.  He has been treated  medically.  He was seen back in the office on May 25, 2002.   Yesterday, he developed some left arm discomfort.  He has had some left arm  problems with some ulnar neuropathy symptoms that he has been told were  secondary to a neck problem.  He went to bed and was awakened at 1 a.m. with  increasing left arm and elbow pain associated with some fullness in  his  throat and mild diaphoresis. He took his morning medicines but did not try  nitroglycerin. He came to the emergency room.  He is now seen in the  emergency room and his pain-free currently.  Initial enzymes are negative.   PAST MEDICAL HISTORY:  1. Remarkable for previous left knee surgery.  2. Left inguinal hernia repair.  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   CURRENT MEDICATIONS:  1. Lipitor 20 mg q.d.  2. Altace 5 mg q.d.  3. Lasix 20 mg q.d.  4. Aldactone 12.5 mg q.d.  5. Aspirin 81 mg q.d.  6. Plavix 75 mg q.d.  7. Coreg 6.25 mg b.i.d.  8. Protonix 40 mg q.d.   ALLERGIES:  He has no known drug allergies.   SOCIAL HISTORY:  He  is married.  He has two children and three  grandchildren.  He is an Corporate investment banker; he quit about two years ago.  He is  active and does weightlifting.   FAMILY HISTORY:  Unremarkable for coronary disease.  His mother died in her  28s of heart failure.  Father died of suicide.   REVIEW OF SYMPTOMS:  Essentially unremarkable except for noted above.   PHYSICAL EXAMINATION:  VITAL SIGNS:  Blood pressure 105/50, pulse 60,  respirations 16.  GENERAL:  He is a well-developed, well-nourished male in no acute distress.  HEENT:  Normocephalic and atraumatic.  Extraocular movements intact.  Sclerae are anicteric.  Lids and conjunctivae are within normal limits.  NECK:  Without bruit and without JVD.  CHEST:  Clear to auscultation and percussion.  CARDIOVASCULAR:  Regular rate and rhythm without murmur, rub, or gallop.  Normal S1 and S2 and a positive S4.  ABDOMEN:  Nontender.  No hepatosplenomegaly is appreciated.  EXTREMITIES:  Without edema.  His right groin is ecchymotic.  There is a  soft systolic bruit.  Distal pulses are intact.  NEUROLOGICAL:  Grossly intact.  He is awake, alert, oriented, and  cooperative.  Moves all extremities without any deficits.   LABORATORY DATA:  His EKG shows sinus rhythm with an LVH which is unchanged.   Hematology shows a white count  of 4.8, hemoglobin 14.2, hematocrit 42.6,  platelets 210,000.  Sodium 136, potassium 3.8, BUN 21, creatinine 0.8, CK of  114, MB 3, troponin 0.03.   IMPRESSION:  1. Unstable angina.  2. Coronary disease, status post right coronary artery stenting May 21, 2002 with residual 80-90% circumflex and 90% obtuse marginal-2 that was     to be addressed with a staged intervention.  He also has a 60-70%     calcified left anterior descending artery narrowing.  3. Left ventricular dysfunction with an ejection fraction of 20-25%.  4. Nonsustained ventricular tachycardia.  The patient was seen by Dr. Doylene Canning. Ladona Ridgel during this last admission.  No implantable cardioverter     defibrillator was recommended at this time.  It appears that this is a     nonischemic cardiomyopathy as there is no scar on Cardiolite or wall     motion abnormality on echocardiogram.   PLAN:  He will be admitted to telemetry and undergo catheterization and  intervention today.  We will start heparin.     [name], P.A.                      Richard A. Alanda Amass, M.D.    [name]  D:  05/28/2002  T:  05/28/2002  Job:  [id number]

## 2010-12-01 NOTE — Cardiovascular Report (Signed)
NAMEJAXDEN, BLYDEN                           ACCOUNT NO.:  [id number]   MEDICAL RECORD NO.:  [id number]                   PATIENT TYPE:  OIB   LOCATION:  NA                                   FACILITY:  [facility]   PHYSICIAN:  Richard A. Alanda Amass, M.D.          DATE OF BIRTH:  [birth year]   DATE OF PROCEDURE:  05/21/2002  DATE OF DISCHARGE:                              CARDIAC CATHETERIZATION   PROCEDURES:  Retrograde central aortic catheterization, right heart  catheterization, thermodilution cardiac output, left ventricular angiogram  RAO and LAO projections, selective coronary angiography by Judkins  technique, abdominal angiogram mid stream PA projection, subselective left  internal mammary artery, intravascular ultrasound interrogation, high-grade  proximal right coronary artery stenosis - ruptured plaque, primary  stenting, high-grade right coronary artery stenosis, oral Plavix and  aspirin, Aggrastat IIb/IIIa inhibitor, weight-adjusted heparin.   DESCRIPTION OF PROCEDURE:  The patient was brought to the second floor CP  Lab in the postabsorptive state after 5 mg of Valium p.o. premedication.  The right groin was prepped, draped in the usual manner.  Then 1% Xylocaine  was used for local anesthesia.  The RFA and RFV were entered with single  anterior punctures using  modified Seldinger technique, an 8 French venous and a 6 [place] Daig  arterial sheath were inserted without difficulty.  Right heart  catheterization was done with an 8 [place] by Edwards flow-directed triple  lumen Swan-Ganz catheter.  Left heart catheterization was done with a 6  French Cordis pigtail catheter. Simultaneous LV and PCW pressures were  obtained along with pullback pressures of the PA. Thermodilution cardiac  output, simultaneous A-3.02 difference were done.  Pullback pressures were  performed.  LV angiogram was done in the RAO and LAO projection at 25 cc 14  cc/sec., 20 cc 12 cc/sec.  The catheter  was brought down above the level of  the renal arteries and abdominal aortic angiogram was done in the mid stream  PA projection at 25 cc 20 cc/sec.  The catheter was removed.  Selective  coronary angiography was performed.  Pre and post IC nitroglycerin  administration with 6 French 4 cm taper preformed coronary catheters.   The patient tolerated the diagnostic procedure well.   PRESSURES:  RA:  14/12, mean 13 mmHg.   RV:  50/10, RVEDP 14 mmHg.   PA:  50/19, mean 34 mmHg.   PCW:  40/33, mean 30 mmHg.   LV:  140/18, LVEDP 26/28 mmHg.   CA:  140/78, mean 100 mmHg.   There was no gradient across the aortic valve on catheter pullback.  There  was no gradient between simultaneous recorded LV and PCW pressures.   Thermodilution CO/CI equal 5.2/2.56.   Estimated Fick CO/CI equal 3.7/1.82.   PA saturation O2 61%, aortic saturation 95%.   LV angiogram in the RAO and LAO projection shows global hypokinesis with EF  of 20-25%.  No mitral regurgitation.  There is no intracardiac or valvular  calcification seen.   ABDOMINAL AORTOGRAM:  Abdominal aortic angiogram in the mid stream PA  projection showed a widely patent single renal artery bilaterally.  Marked  tortuosity of the iliacs. No significant stenosis.  No abdominal aneurysm.  Mild calcification of the iliacs without stenosis.   The main left coronary was normal.   The left anterior descending showed a calcific 60-70% stenosis of the LAD  prior to the moderate sized first diagonal branch and involving the septal  perforator branch.  The remainder of the LAD had irregularities without  significant stenosis.  There was a large thin second diagonal branch arising  from the mid LAD that bifurcated with no significant stenosis.   The circumflex artery was moderately large and nondominant.  It gave off a  left atrial branch followed by a small OM-1.  There was then a bifurcation  lesion of the circumflex proper and the OM-2 with  90% stenosis just beyond  the ostium of the OM-2 and 85-90% stenosis of the circumflex.  Proximal to  the bifurcation there was no significant disease.  These were mildly  segmental and concentric.  The distal circumflex and OM-2 bifurcated  distally. There was good flow.   The right coronary was a dominant vessel.  It was a very large vessel.  There was a 70-80% stenosis of the RCA at the junction of the proximal third  before the acute bend.  There appeared to be angiographic evidence of  eccentric irregular stenosis and possible compatible with ruptured plaque.  The mid RCA had 50% segmental eccentric stenosis with calcification  laterally.  The remainder of the RCA was a large vessel, gave off an RV  branch in the midportion, PDA and PLA distally and had no significant  stenosis.   The patient has severe LV dysfunction. He has a ruptured plaque type  stenosis of his proximal dominant RCA that may be contributing to his recent  symptoms, although his LV dysfunction seems to be out of proportion to his  coronary disease since there are no history of myocardial infarction,  evidence of total occlusion.  He had severe hypokinesis on his outpatient  Cardiolite by Dr. Patrecia Pace, but the viability scan was not performed.   The patient does have a history of exertional dyspnea and chest discomfort  that may be related to angina.  It was felt best to proceed with IVUS  interrogation of his RCA lesion because of his angiographic appearance.  This was done with a 40 megahertz Atlantis IVUS catheter (2.5 mm).  The  patient was given Plavix 300 mg in the lab.  She was given weight-adjusted  heparin in the lab.  He was given Aggrastat bolus plus infusion.  He was  also given 10 g of aspirin in the laboratory since he had not been taking  this regularly at home. ACTs were monitored throughout the procedure and  were graded at 220 seconds.  The RCA was intubated with a 6 [place] JR4 guiding  catheter and the stenosis  was crossed with an HTF 0.014 inch guide wire. This was free in the distal  RCA.  IVUS interrogation was then performed with the Galaxy system.  There  were problems with the Galaxy system.  We were able to make some  measurements.  We could not print out all of our measurements of the system  through the system. We were able to get good IVUS images for diagnostic  evaluation and some images were printed out.   IVUS interrogation of the mid RCA showed good residual lumen, probably over  3 mm or approximately 2.5 to 3 mm with 50% eccentric plaque like narrowing  with some mild calcific rim. This was felt to be a good residual lumen with  no dissection seen.   The proximal RCA had a stenosis of approximately 70-80% which was  concentric, somewhat disrupted and a residual lumen of approximately 2 to  2.1 mm fairly focally.  The distal reference vessel was large at  approximately 4.5 or slightly greater to the media to media.   It was elected to proceed with intervention.  This was done.  The patient  had received a total of 5800 units of heparin, Aggrastat bolus plus  infusion. The lesion was crossed with a 4.5/12 Express II Monorail balloon  expandable stent. It was positioned fluoroscopically and deployed at 10-33  and post-dilated at 10-30.  The balloon was pulled back.  The patient had  transient no reflow phenomenon and chest pain that was promptly relieved  with 200 mcg of verapamil.   TIMI-3 flow returned promptly.  IVUS interrogation was done and showed full  apposition of the stent with excellent result with no dissection.  The IVUS  catheter was removed and final scout injection was done showing stenosis  reduction of 70-80% to 0%, good TIMI-3 flow with 50% smooth eccentric  narrowing of the mid RCA that was unchanged.   We did not proceed with any other intervention because of dye  considerations.  The catheter was removed and side-arm sheath was  flushed  and the patient was brought to the holding area for postoperative ACT  measurement and sheath removal.   This patient is a 70 year old gentleman whose history is well outlined (see  history and physical).  He has been married for 43 years, has two children  and three grandchildren, is an ex-smoker, quit approximately two years ago.  For the last six months, he has had progressive dyspnea on exertion and  intermittent exertional chest discomfort.  He says he has had this chest  discomfort, however, for many years. He does do weight lifting and has a  hosted supplements with him.  However, he does not use steroids and has  never.   He had a Cardiolite stress test by Dr. Patrecia Pace for evaluation of chest pain  and shortness of breath and this showed global hypokinesis with some  reversibility near the apex.  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  A two-dimensional echocardiogram was also  done confirming LV dysfunction with an EF of 20-25%, left atrial enlargement  of 25.3, no LV crux, and moderate pulmonary hypertension with estimated PA  systolic 50 mmHg.   The patient does have three-vessel coronary disease. It is difficult to tell  whether he has hibernating myocardium or not.  We felt in view of his chest  pain and ruptured plaque like lesion of the proximal RCA, that PCI should be  done and this was accomplished successfully.   He has high-grade bifurcation disease of the circumflex, obtuse marginal #2  that is amendable to intervention and I would recommend doing this in a  state fashion.  He may also need to assess his proximal LAD with IVUS  interrogation which is borderline angiographically 60-70%.   In addition, we will  treat him for severe LV dysfunction since his LV  dysfunction appears to be out of proportion to his coronary disease.  It is  not clear  whether some of his supplements for weight lifting and body  building had any affect, although as mentioned, he denies any steroid use in  the past.   I recommend treatment with diuretics which have already been begun, beta  blockers, which have been begun and Aldactone along with statins, aspirin  and Plavix.  Followup for medication adjustment and consideration for staged  PCI.   CATHETERIZATION DIAGNOSES:  1. Severe left ventricular dysfunction, probably nonischemic type etiology.  2. Stable angina with positive Cardiolite.  3. Rule out hibernating myocardium.  4. High-grade stenosis, proximal right coronary artery, treated successfully     with primary stenting post intravascular ultrasound interrogation, good     results with repeat intravascular ultrasound post implant.  5. Ex-smoker, quit two years ago.  6. Multiple supplements.  7. Hyperlipidemia.  8. Benign prostatic hypertrophy.  9. High-grade bifurcation, circumflex obtuse marginal #2 stenosis,     borderline left anterior descending stenosis.              @@                                                 Richard A. Alanda Amass, M.D.    RAW/MEDQ  D:  05/21/2002  T:  05/21/2002  Job:  [id number]   cc:   Alan Mulder, M.D.  [address]  [place]  [place] [address]  Fax: [phone]   [facility]   Nanetta Batty, M.D.  [address], [address]  [place]  [place] [address]  Fax: [phone]

## 2010-12-01 NOTE — Discharge Summary (Signed)
[name], [name]               ACCOUNT NO.:  [id number]   MEDICAL RECORD NO.:  [id number]          PATIENT TYPE:  INP   LOCATION:  2024                         FACILITY:  [facility]   PHYSICIAN:  Richard A. Alanda Amass, M.D.DATE OF BIRTH:  [birth year]   DATE OF ADMISSION:  12/24/2004  DATE OF DISCHARGE:  12/27/2004                                 DISCHARGE SUMMARY   ADDENDUM:   DISCHARGE MEDICATIONS:  1.  Coreg 12.5 mg b.i.d.  2.  Lipitor 40 mg 1 daily.  3.  Plavix 75 mg 1 daily.  4.  Multivitamin 1 tablet daily.  5.  Lasix 20 mg 1 tablet daily.  6.  Lisinopril 10 mg 1 daily.  7.  Aspirin 81 mg 1 daily.  8.  Protonix 40 mg b.i.d.  9.  Prilosec 40 mg b.i.d.  10. Nitroglycerin p.r.n. chest pain.  11. Start isosorbide mononitrate 30 mg 1 daily if he has any more throat      fullness.   He should do no strenuous activity x4 days. He should be on a low sodium  diet. Follow up with x-ray and Cardiolite on January 04, 2005 at 1:15 and he  will then follow up with Dr. Alanda Amass on January 15, 2005 at 2 p.m.      Lezlie Octave, N.P.      Richard A. Alanda Amass, M.D.  Electronically Signed    BB/MEDQ  D:  03/14/2005  T:  03/15/2005  Job:  [id number]   cc:   Wilhemina Bonito. Marina Goodell, M.D. [facility]  [address]  [place]  [place] [address]   Alan Mulder, M.D.  [address]  [place]  [place] [address]  Fax: [phone]

## 2010-12-01 NOTE — Cardiovascular Report (Signed)
[name], [name]               ACCOUNT NO.:  [id number]   MEDICAL RECORD NO.:  [id number]          PATIENT TYPE:  OBV   LOCATION:  6526                         FACILITY:  [facility]   PHYSICIAN:  Richard A. Alanda Amass, M.D.DATE OF BIRTH:  July 16, 1941   DATE OF PROCEDURE:  08/03/2004  DATE OF DISCHARGE:                              CARDIAC CATHETERIZATION   PROCEDURE:  Retrograde central aortic catheterization, selective right  coronary angiography pre- and post IC nitroglycerin administration, double  wire technique with balloon alignment T stenting and tandem inflations with  DES stent deployment, high grade ostial PDA stenosis, post IVUS  interrogation, mid RCA IVUS interrogation and tandem DES stenting, side  branch RV marginal, plaque shift high grade stenosis treated with POBA,  Aggrastat bolus plus infusion, continued aspirin and Plavix, addition of  Plavix 150.   Please refer to catheterization report of August 02, 2004, for patient's  extensive history and diagnoses of CAD.  Essentially, he has a history of  ischemic cardiomyopathy, initially diagnosed in 2003.  Diagnostic  catheterization revealed high grade circumflex marginal bifurcation and  right coronary disease and he underwent bifurcation and stenting with  combination of DES and bare metal of the circumflex OM2, proximal RCA, large  vessel stenting in a stage procedure.  This subsequently improved his LV  from 20 to 25% to greater than 50% with medical therapy post  revascularization.  He subsequently required LAD intervention for  progression of disease which was done in May of 2005, on IVUS interrogation  and he had cutting balloon atherectomy and DES stenting of the proximal LAD  which has held up well long term.  He has had several intracatheterizations  and has a high grade restenosis of the ostial and proximal diffuse in-stent  restenosis of the OM2 with no restenosis of the mid circumflex bifurcation  and  overlapping stents.  Prior attempt at crossing this by Dr. Jacinto Halim as  culprit lesion, was unsuccessful.  He is admitted now for unstable angina  without myocardial infarction and no overt CHF.   On August 02, 2004, he had diagnostic catheterization reveal patient LAD,  TAXUS stent (Nov 24, 2003), widely patent proximal Express II stent  (November 2003), patent overlapping CYPHER and Zipper mid circumflex stents.  The OM2 had 90% in-stent narrowing similar or slight progressed from before  with antegrade flow to the OM2. He had new hazy 85% proximal ostial PDA  stenosis and eccentric 50 to 70% calcific mid right coronary stenosis.  The  proximal RCA stent was widely patent.  EF was approximately 50% with minor  wall motion abnormalities.   Attempts at recanalization of the OM2 were done on August 02, 2004.  See  report.  We were able to cross at the OM2 with several wires including body  wire, but were not able to get a balloon through the struts of the previous  stent into the OM2 to dilate this. We elected to treat this medically.  He  is brought back today for a staged intervention with IVUS interrogation of  his RCA.  Informed consent was obtained.  Renal function was normal.  He was  hydrated preoperatively.   The patient reported to the second floor CP lab in postabsorptive state  after 5 mg Valium p.o. premedication.  The left groin was prepped and draped  in the usual manner, 1% Xylocaine was used for local anesthesia.  He  received a total of 4 mg of Nubain in divided doses in the lab and an extra  150 of Plavix and was given __________ heparin of 6800 units.  Aggrastat  double bolus plus infusion was begun with ACTs monitored.  The LFA was  entered with a single anterior puncture using 18 thin wall needle and a 7  French short Daig side arm sheath was inserted without difficulty.  The  right coronary was intubated with three or four side-hole Scimed guiding  catheter.  The  ACTs were therapeutic.  An Asahi 0.014 soft wire was used to  cross the PDA lesion and was free in the distal PDA.  A second wire was used  to cross into the PLA for double wire balloon alignment T stenting  technique.  IVUS interrogation was done with an Atlantis probe, Scimed IVUS.  This demonstrated a distal reference diameter of 2.7 to 3.0 in the PDA, a  high grade eccentric calcific moderately soft plaque with a lumen diameter  of 1.6 x 2.1 mm approximately 80 to 85% stenosis.  The distal RCA was larger  at 3.5 mm.  The mid RCA had  eccentric calcific plaque and measured 1.9 x  2.4 cm and a greater than 4.0 vessel and this was felt to be significant by  angiography and particularly IVUS.   We then crossed the PDA lesion with a 2.56 cutting balloon and the ostia was  dilated at 6-40 and 6-32.  The balloon was exchanged for a TAXUS II 3.0/12  stent which was positioned just beyond the ostia of the PDA.  A second  balloon was a 3.25/15 Maverick, positioned across the origin of the PDA.  The PDA stent was pulled back slightly. The Maverick balloon was dilated at  low pressure 4 atmospheres and the stent was pulled back for balloon  alignment.  The sent was then deployed.  A TAXUS 3.0/12 in the ostial  PDA,  a 12-40.  The Maverick balloon was deflated and the stent was post dilated  again at 14-36.  The balloons were pulled back.  Stenosis was reduced from  85% to 0 with excellent flow that was about 30% narrowing, eccentric of the  PLA which was unchanged with good flow and TIMI III flow to these large  branches.   We then approached the mid RCA and this was dilated with a 3.5/10 cutting  balloon at 7-45 and 7-35.  This was exchanged for a 3.5/18 TAXUS II stent  which was dilated at 16-32 and post dilated at 18-23 across the large RV  branch in the mid RCA.  There was an edge dissection at the proximal portion of the stent so this was covered with an overlapping 3.5/12 TAXUS stent that   was deployed at 18-23.  The overlap was dilated again at 14-30.  Effective  diameter was approximately 4 mm or greater.  There was excellent  angiographic result and the stenosis was reduced from 70% eccentric to -10%.  The previously placed proximal RCA stent was widely patent with less than  10% narrowing.   We then tried to access the RV marginal branch since there was plaque shift  and stenosis in this area of approximately 85%.  Initially multiple wires  were tried including Asahi soft and a Choice PT2 and a Asahi light wire but  because of the right angle origin of this, we were not able to enter this RV  branch selectively.  We then used a balloon deflecting technique.  Asahi  soft wire was placed into the PLA across the lesion and then a 3.0 balloon  was dilated just beyond the origin RV branch (3.25/15 Maverick) at 4  atmospheres low pressure essentially the stent.  We were then able to  deflect the Choice PT wire, guide this across the right angle lesion and  stenosis.  The RV branch was then crossed with a 2.5/15 Maverick and  inflation was done at 6-59.  Balloon was pulled back.  Final injections  demonstrated some haziness but excellent flow and less than 30% residual  narrowing in the RV marginal.  The RV branch itself was widely patent in the  mid portion with 0% narrowing. The PDA was widely patent with excellent flow  and there was about 30 to 40% narrowing or less but smooth of the proximal  PLA that was not treated.   The patient does have lumpy, bumpy disease of 20 to 30% segmentally in the  proximal RCA and 20% narrowing of the distal RCA.   Recommend continued medical therapy would be continued on Aggrastat for 18  hours.  Continued medical therapy of his coronary disease, hyperlipidemia.  Fluoroscopically we can see pleural calcification from his remote asbestos  exposure and we will go ahead and get a follow-up CT of the chest which he  needs and had a negative  one in the past.  LV function has remained good  clinically and on catheterization yesterday and he does not have overt CHF.   There was also a history of GERD and past Barrett's esophagus and chronic  PPI.   CATHETERIZATION DIAGNOSES:  1.  Successful cutting balloon atherectomy and subsequent DES stenting of      ostial posterior descending artery stenosis with balloon alignment, T      stenting double wire technique.  2.  Successful cutting balloon atherectomy and tandem DES stenting mid right      coronary artery.  3.  Successful side branch right ventricular marginal ostial percutaneous      transluminal coronary angioplasty for plaque shift with POBA.  4.  Mild left and moderate right renal artery stenosis Duplex pending with      systemic hypertension, normal renal function.  5.  Hyperlipidemia on therapy.  6.  Barrett's esophagus, gastroesophageal reflux disease on therapy. 7.  Ischemic cardiomyopathy with ejection fraction 25% improved to greater      than 50% post revascularization and medical therapy as outlined above.  8.  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      RAW/MEDQ  D:  08/03/2004  T:  08/03/2004  Job:  [id number]   cc:   Alan Mulder, M.D.  [address]  [place]  [place] [address]  Fax: [phone]   CT lab

## 2010-12-01 NOTE — Consult Note (Signed)
[name], [name] NO.:  [id number]   MEDICAL RECORD NO.:  [id number]                   PATIENT TYPE:   LOCATION:  2018                                 FACILITY:  [facility]   PHYSICIAN:  Doylene Canning. Ladona Ridgel, M.D. [facility]           DATE OF BIRTH:  Jun 29, 1941   DATE OF CONSULTATION:  05/23/2002  DATE OF DISCHARGE:  05/24/2002                                   CONSULTATION   REASON FOR CONSULTATION:  Evaluation of nonsustained  ventricular  tachycardia in a patient with  nonischemic cardiomyopathy, but a background  of coronary disease.   HISTORY OF PRESENT ILLNESS:  The patient is a very pleasant 70 year old man  who has been very active in the past, but was admitted to the hospital after  he was found to have severe LV dysfunction by Cardiolite stress testing.  At  that time there was diffuse hypokinesis, but no clear-cut scar.  The patient  has subsequently undergone heart catheterization demonstrating an ejection  fraction of 20-25%.  He also was found to have two vessel coronary disease  and has undergone angioplasty with stenting.  He is scheduled for a second  procedure as well.  The patient denies any history of syncope or near  syncope.  He denies any frank chest pain.  On telemetry monitoring he has  had nonsustained VT at a cycle length of 360 milliseconds.  The patient  denies any peripheral edema or other congestive heart failure symptoms  except for fatigue.   SOCIAL HISTORY:  He is married and lives in [place].  He quit smoking  approximately two years ago.  He exercises daily.  He denies ethanol abuse.   FAMILY HISTORY:  Notable for mother dying at age 70 of heart failure and  father dying of suicide.   REVIEW OF SYSTEMS:  Negative for fevers, chills, night sweats, or  adenopathy.  He denies any vision or hearing problems.  He denies any weight  changes.  He denies any skin rashes or lesions.  He does have exertional  chest discomfort,  shortness of breath and fatigue.  He denies peripheral  edema, orthopnea or claudication.  He denies cough.  He denies dysuria,  hematuria, nocturia.  He denies weakness, numbness or motor disturbances.  He denies arthralgias or joint deformities.  He denies nausea, vomiting,  diarrhea or constipation.  He denies any polyuria or polydipsia, heat or  cold intolerance.   PHYSICAL EXAMINATION:  GENERAL:  He is a pleasant, well-appearing, 61-year-  old man in no distress.  VITAL SIGNS:  Blood pressure 120/70, pulse 70 and regular, respirations 20,  temperature 97.8.  HEENT:  Normocephalic and atraumatic.  Pupils equal, round and the  oropharynx is moist, his sclerae are anicteric.  NECK:  Revealed no jugular venous distention, there is no thyromegaly,  carotids are 2+ and symmetric, trachea midline.  CARDIOVASCULAR:  Regular rate and rhythm with normal  S1, S2.  There are no  murmurs or extra heart sounds appreciated.  LUNGS:  Clear bilaterally to auscultation with no wheezes, rales or rhonchi.  ABDOMEN:  Soft, nontender, nondistended.  There was organomegaly.  Bowel  sounds are present.  SKIN:  Revealed no rashes or lesions.  He was quite hyperpigmented  consistent with sun exposure.  EXTREMITIES:  He denies cyanosis, clubbing or edema.  There are no rashes or  petechia lesions.   LABORATORY DATA:  EKG demonstrates normal sinus rhythm with LVH.   IMPRESSION:  1. Nonischemic cardiomyopathy, etiology unknown, but the patient did have a     previous upper respiratory illness approximately six months ago with this     being followed by increasing dyspnea.  2. Coronary artery disease as noted by Drs. Tresa Endo and Nixon, with class     1-2 congestive heart failure symptoms.  3. Ventricular tachycardia/nonsustained cycling 360 milliseconds     (asymptomatic).  4. Dyslipidemia on staten therapy.   DISCUSSION:  Without a focal scar and Cardiolite or segmental wall motion  abnormality by 2D  echocardiogram or clear infarct on the 12-Lead EKG, I  would not recommend EP testing or ICD insertion at the present time.  Despite his left ventricular dysfunction, it does not appear that his left  ventricular function has declined secondary to focal scar, but rather  diffuse hypokinesis secondary to his underlying nonischemic cardiomyopathy.  I would recommend beta blocker and ACE inhibitor therapy for this patient,  and revascularization as you deem appropriate.  Indications for empiric ICD  insertion would include a history of sustained ventricular tachycardia or  worrisome syncope.                                               Doylene Canning. Ladona Ridgel, M.D. [facility]    GWT/MEDQ  D:  05/23/2002  T:  05/25/2002  Job:  [id number]   cc:   Gerlene Burdock A. Alanda Amass, M.D.  [phone]  [place]  [place] [address]  Fax: [phone]   Arvella Merles, M.D.

## 2010-12-01 NOTE — Discharge Summary (Signed)
[name], [name]               ACCOUNT NO.:  [id number]   MEDICAL RECORD NO.:  [id number]          PATIENT TYPE:  INP   LOCATION:  6523                         FACILITY:  [facility]   PHYSICIAN:  [name], M.D.DATE OF BIRTH:  31-Jan-1941   DATE OF ADMISSION:  08/01/2004  DATE OF DISCHARGE:  08/04/2004                                 DISCHARGE SUMMARY   HISTORY OF PRESENT ILLNESS:  [name] is a 70 year old patient of Dr.  Susa [name] who came into the hospital with chest pain.  He has a long  cardiac history of chest pain and history of myocardial infarction in the  past.  He also has dyslipidemia, hypertension, asbestosis, gastroesophageal  reflux disease, hiatal hernia and anemia.  He was seen by Dr. Effie [name] who  was on call for the service in the early morning hours.   HOSPITAL COURSE:  The following day he underwent cardiac catheterization.  He was found to have progressive coronary artery disease.  He had an attempt  at an OM2 in-stent restenosis, percutaneous transluminal coronary  angioplasty; however, Dr. Alanda [name] was unable to cross with a balloon, thus  it was an unsuccessful attempt.  He also had an 80% proximal PDA stenosis  and mild progression of his mid RCA.  He had no restenosis of his CYPHER  stent from 05/29/02.  No restenosis from mid Express 2 bare metal stent  11/03.  No restenosis of his LAD TAXUS stent from 11/24/03.  His ejection  fraction was noted to be 50%.  He had a moderate right renal artery stenosis  and mild left artery stenosis by outpatient Dopplers.  The following day, he  underwent repeat catheterization and had successful cutting balloon  arthrectomy and stenting of an ostial PDA.  He had a successful cutting  balloon arthrectomy and tandem DES stenting to his mid RCA.  He had a  successful side branch, right ventricular marginal, ostial percutaneous  transluminal coronary angioplasty for plaque shift with POBA.  Post  procedure  he did well without any problems.  On 08/04/04 he was considered  stable to be discharged home.  His blood pressure was 114/62, heart rate 68,  respirations 20, temperature 98.4.  Dr. Alanda [name] did order him a CAT scan  to follow up on his pleural calcifications, and this was performed before  his discharge.   DISCHARGE LABORATORY DATA:  Labs showed a sodium of 142, potassium 3.5, BUN  5, creatinine 0.9.  His glucose was 115, hemoglobin 10.1, hematocrit 28.9,  platelets 149.  WBC's were 4.8.  CK-MB's and troponins were negative.  PSA  was 0.62.  His total cholesterol is 151, triglycerides 64, HDL 98, LDL 40.  Post catheterization:  CK-MB's 08/03/04:  CK 181, MB 13.1.  Troponin 0.84; #2  CK 195, MB 15.3, troponin 1.53.  His CK-MB's and troponins on 08/02/04 were  negative.  AST was 29, ALT 24.   DISCHARGE MEDICATIONS:  1.  Plavix 75 mg, 1 tablet a day.  2.  Aspirin 81 mg, 1 tablet a day.  3.  Lipitor 40 mg, 1 tablet a  day.  4.  Altace 5 mg, 1 tablet a day.  5.  Lasix 20 mg, 1 tablet a day.  6.  Coreg 12.5 mg, two times per day.  7.  Protonix 40 mg, two times per day.  8.  Folic acid 2 mg, 1 tablet per day.  9.  Nu-Iron 150 mg, two times per day x1 month.  10. Omega-3 fish oil.  11. Niacin 500 mg as taken previously.   DISCHARGE INSTRUCTIONS:  He should do no strenuous activity, lifting,  pushing or pulling x5 days and no driving x1 day.  He should be on a low-  fat, low-cholesterol diet.  If there is any problem with his groin he will  give [place] a call.   FOLLOWUP:  He will follow up with Dr. Alanda [name] on 2/14 at 11:45.   DISCHARGE DIAGNOSES:  1.  Chest pain.  2.  Progressive coronary artery disease.  3.  Status post cardiac catheterization with failed attempt at percutaneous      intervention of in-stent restenosis of his OM2.  Procedure 08/03/04 with      an arthrectomy, a DES stenting of ostial PDA, balloon arthrectomy and      tandem DES stenting to his mid RCA, side branch right  ventricular      margin, marginal ostial percutaneous transluminal coronary angioplasty.  4.  Ischemic cardiomyopathy with ejection fraction 25%, improved to greater      than 50% post revascularization and medical therapy.  5.  Hyperlipidemia.  6.  Mild left and moderate right renal artery stenosis.  7.  Hypertension.  8.  Barrett's esophagus, gastroesophageal reflux disease.  9.  Asbestosis.      BB/MEDQ  D:  10/13/2004  T:  10/14/2004  Job:  [id number]   cc:   [name], M.D.

## 2010-12-01 NOTE — Discharge Summary (Signed)
[name], LEVERICH NO.:  [id number]   MEDICAL RECORD NO.:  [id number]          PATIENT TYPE:  INP   LOCATION:  6529                         FACILITY:  [facility]   PHYSICIAN:  Nanetta Batty, M.D.   DATE OF BIRTH:  01-Jan-1941   DATE OF ADMISSION:  07/03/2007  DATE OF DISCHARGE:  07/04/2007                               DISCHARGE SUMMARY   DISCHARGE DIAGNOSES:  1. Progressive chest pain, dyspnea.  2. Abnormal Myoview:  3. Coronary artery disease.      a.     Percutaneous transluminal coronary angioplasty and stent       deployment with IVUS guide to the proximal right coronary artery       with __________  drug-eluting stent.      b.     History of multiple __________  with stents in the past.  4. On chest x-ray an 85-mm nodule, right upper lobe.  A limited      computerized tomography was done which was negative for nodule.  5. Hypertension.  6. Hyperlipidemia.   DISCHARGE CONDITION:  Stable and improved.   PROCEDURES:  On 07/03/2007, __________  heart catheterization by Dr.  Nanetta Batty.  On 07/03/2007, percutaneous transluminal coronary angioplasty and stent  deployment to the proximal right coronary artery.  This was IVUS guided.   DISCHARGE MEDICATIONS:  1. Plavix 75 mg daily.  2. Simvastatin 80 mg daily.  3. Lisinopril 10 mg daily.  4. Omeprazole 20 mg twice a day.  5. Carvedilol 12.5 mg twice a day.  6. Lasix 20 mg daily.  7. Increase aspirin to 325 mg daily until sees Dr. Allyson Sabal.  Do not stop Plavix.   DISCHARGE INSTRUCTIONS:  1. Low sodium heart healthy diet.  2. Wash right groin cath site with soap and water.  Call with signs of      bleeding, swelling or drainage.  3. Increase activity slowly.  4. May shower or bathe.  5. No lifting for two weeks.  6. No driving for two days __________  7. Followup with Dr. Allyson Sabal.  The office will call with date and time.   HISTORY OF PRESENT ILLNESS:  This is a 70 year old gentleman with  history of  ischemic heart disease with multivessel __________  and  stenting with drug-eluting stents in the past, who has hypertension and  hyperlipidemia.  He was last counseled in June of 2006 with patent  stents and normal LV function.  He did have an 80% re-stenosis within  the circumflex gel stent, but it was unable to be accessed in the past.  He also had 50% lesions in his proximal and mid RCA.  A Myoview study  was done in July of 2008 which revealed inferior ischemia.  He had some  atypical chest pain and throat pain, does have reflux __________  Barrett's esophagus, treated with laser in the past.  He had noted  progressive dyspnea on exertion over the last couple of weeks and so he  was seen by Dr. Allyson Sabal and set up for an elective cardiac cath.  This was  done on  07/03/2007 and was found to have moderate proximal dampened RCA.  IVUS was used and PCTA and stent was deployed with __________  drug-  eluting stent.  The patient did well, was kept over-night, and by the  next morning was ready for discharge and was ambulating without  problems.   He did have on his chest x-ray a small nodule.  He underwent CT scan  which did not even show this same nodule.   PHYSICAL EXAMINATION AT DISCHARGE:  VITAL SIGNS:  Blood pressure 112/69,  pulse 81, respirations 18, temp 98.3, oxygen saturation on room air 94%.  HEART:  Regular rate and rhythm.  LUNGS:  Clear.  ABDOMEN:  Soft.  Groin was without hematoma.   LABORATORY VALUES:  Prior to procedure hemoglobin 14.3, hematocrit 41.9,  platelets 192.  These remained stable.  PT 12.7, INR 0.9, PTT 23.   Chemistry:  Sodium 140, potassium 4, chloride 103, CO2 27, glucose 100,  BUN 17, creatinine 0.88 and these remained stable.  CK post procedure  was 55, MV 1.3, troponin I 0.02.   Post procedure EKG:  Sinus bradycardia, heart rate 49 with PVC.  Then on  07/03/2008:  Sinus rhythm, rate of 62, heart rate had increased and it  was felt to be a normal  EKG.   Chest x-ray, though I do not have that dictated report, did have a  nodule in the right upper lobe.  We did a CT of the chest:  No pulmonary  nodule to correspond to plain film nodule, also seen on prior as  asbestos exposure but no asbestosis on this exam.  He also had left  renal punctuate calculus and advanced coronary disease.   The patient ambulated without pain, was ready for discharge and was  discharged home with followup as an outpatient with Dr. Allyson Sabal at the  patient's request.      Darcella Gasman. Annie Paras, N.P.      Nanetta Batty, M.D.  Electronically Signed    LRI/MEDQ  D:  07/30/2007  T:  07/30/2007  Job:  [id number]   cc:   Alan Mulder, M.D.

## 2010-12-01 NOTE — Cardiovascular Report (Signed)
NAMEJEANPAUL, BIEHL               ACCOUNT NO.:  [id number]   MEDICAL RECORD NO.:  [id number]          PATIENT TYPE:  OBV   LOCATION:  3737                         FACILITY:  [facility]   PHYSICIAN:  Richard A. Alanda Amass, M.D.DATE OF BIRTH:  10-27-1940   DATE OF PROCEDURE:  08/02/2004  DATE OF DISCHARGE:                              CARDIAC CATHETERIZATION   PROCEDURE:  Retrograde central aortic catheterization, selective coronary  angiography reopposed IC nitroglycerin administration by Judkins technique,  LV angiogram RAO/LAO projection, subselective LIMA, bilateral aortic  angiogram midstream PA projection, selective right renal angiography via  Judkins technique via right coronary catheter for suspected high grade renal  artery stenosis with renal vascular hypertension, known diffuse  cardiovascular disease. Attempted PTCA of chronic high grade in-stent  restenosis, Express II stent OM2 done through mid circumflex, previously  placed 3.0 Zipper Medtronic stent - able to cross with wire, but not  balloon, unsuccessful PCI attempt.   COMPLICATIONS:  None.   ESTIMATED BLOOD LOSS:  Approximately 40 to 50 mL.   ANESTHESIA:  5 mg Valium p.o. premedication, 1% local Xylocaine.  Intermittent Nubain total of 6 mg in divided doses IV.   ANCILLARY MEDICATION:  Additional Plavix 150, Aggrastat bolus plus infusion,  weight-adjusted heparin 5300 units.   BRIEF HISTORY:  Elijah Birk is a patient of Dr. Johny Chess and well-known to me.  He  is a 64 year old married father of two with three grandchildren who quit  smoking 2-1/2 years ago.  He first presented to [place] in October of 2003 with  symptoms of congestive heart failure and ischemic equivalence of exertional  dyspnea, and episodic jaw discomfort.  Catheterization showed high grade  right and bifurcation mid circumflex stenosis with noncritical LAD stenosis,  global hypokinesis with EF of 20 to 25%, CO/CI 3.7/1.82.   He underwent staged intervention  in the hopes of improving his LV function.  He also has bilateral pleural calcification with past negative CT's of the  chest and remote asbestos (see history).   He has hyperlipidemia, asymptomatic PVC's.   In October of 2003, he had bifurcation stenting of the mid circumflex and  OM2.  This was initially done with a Site 2.45/13 Cypher stent in the mid  circumflex at the origin of the OM2 and the OM2 was treated with a 2.25/12  Express II stent.  Because of edge dissection and overlapping 3.09 Zipper  stent was placed in the mid circumflex so the OM2 origin was crossed  completely.  He then had subsequent staged mid RCA large vessel Express II  4.5/12 stenting in November of 2003.   He had in-stent restenosis of the OM2, but Vonna Kotyk R. Jacinto Halim, M.D. was unable to  cross this lesion on prior attempt of September of 2004, so he was treated  medically with noncritical LAD disease.   He reappeared with recurrent angina and on Nov 24, 2003, had IVUS-guided  cutting balloon atherectomy and DES stenting of an 80 to 85% proximal LAD  lesion between the first two diagonals.  He had some minor diagonal ostial  stenosis which was felt to be noncritical.  He did well up until June of  2005 when he had recurrent pain and underwent catheterization by Gaspar Garbe B.  Little, M.D.  This showed no significant change with no RCA or LAD stent  restenosis, no mid circumflex stent restenosis, but disease chronically in  the OM2 stented area with moderate diagonal disease.   He appears now with recurrent chest pain compatible with ischemia without MI  for diagnostic catheterization.   DESCRIPTION OF PROCEDURE:  Catheterization done with 6 French 4 cm taper  preformed coronary and pigtail catheters through a 6 [place] Daig sidearm  sheath that was placed in CRFA via single anterior puncture using modified  Seldinger technique with an 18 thin-wall needle.  Following left coronary  angiography, pre and post IC  nitroglycerin, right coronary angiography was  performed followed by subselective LIMA which showed a tortuous left  subclavian, but a patent LIMA, antegrade left vertebral flow.  LV angiogram  was done in the RAO and LAO at 25 mL (14 mL per second for each projection).  A pullback pressure of the CA showed no gradient across the aortic valve.  Abdominal angiogram was done in the midstream PA projection and this  demonstrated bilateral renal artery stenosis.  It was approximately 40%  calcific renal artery stenosis with a single renal artery on the left.  On  the right, there was circumferential ostial calcification.  It appeared high  grade on the angiogram.  On selective angiography it appeared to be  approximately 60%.  Follow-up Dopplers will be done.  If renal abdominal  aorta had mild to moderate atherosclerosis with calcification, but no  aneurysm or stenosis.  The iliacs were quite tortuous with no stenosis.  The  hypogastrics were intact and the SFA/profunda junction was intact  bilaterally.  The abdominal aorta showed a patent proximal celiac and SMA  axis and a patent IMA.   LV angiogram demonstrated hypokinesis of the mid to distal third of the  inferior wall and the mid anterolateral wall and septal apical portion.  EF,  however, was approximately 50% with sinus beats and there was no mitral  regurgitation.  Angiographic LVH was present.   It should be noted with the patient's above history that after  revascularization with multiple PCI's, his LV function improved to greater  than 50% both by echo and past catheterizations with no clinical CHF and  stable on medication.   Fluoroscopy showed 2 to 3+ calcification of the proximal LAD, circumflex,  and RCA.  The previously placed stents were seen.   The main left coronary was normal.  The LAD was tortuous.  There was less  than 10 to 20% narrowing of the LAD stent with a large residual lumen between the small first diagonal  and small second diagonal branch.  There  was approximately 40% to 50% narrowing of the LAD beyond the stented area  near the DX2 at a bend.   The LAD then had 50 to 60% narrowing involving and just distal to the DX3  which is a small vessel and arose from the mid distal third junction of the  LAD.  The LAD coursed to the apex where it bifurcated.   The first diagonal was small with no significant stenosis.  The septal  perforator had 70% ostial stenosis and arose from the midportion of the  stent.  The DX2 had 40 to 50% ostial proximal narrowing with good flow.   The DX3 ostia had 70 to 80% narrowing.  The circumflex artery had 40% smooth narrowing in the proximal third after  the atrial branch followed by small OM1.  The OM2 was stented and had 90%  segmental in-stent restenosis as visualized before.   The circumflex mid across and beyond the OM2 had 30% narrowing just beyond  the OM2, less than 20% narrowing in the mid circumflex proximal to the OM2.  There was good TIMI 3 flow to the distal circumflex and near TIMI 3 flow to  the OM2.   The right coronary artery was a dominant large vessel.  The Express II stent  in the midvessel had less than 10% narrowing.  There was then eccentric 50%  narrowing before the acute margin moderately segmental, representing  progression of disease.   There was hazy 80% stenosis of the proximal PDA after its origin from the  distal right and there was eccentric stenosis of 40% just opposite of this  in the origin of the PLA across the PDA.   It was difficult to tell what this patient's culprit lesion was.  It was  felt that it might be related to his PDA and/or his restenosis in the OM2  which was chronic.   Since he had an unsuccessful PCI in that vessel before, it was felt that we  should try that again to see if we could cross with some newer technology to  dilate within that stent for his ISR.  We then considered staged  intervention,  possibly for the distal RCA PDA lesion.  His DX2 and DX3 are  small vessels with ostial lesions of moderate degree in the DX3 and mild in  the DX2 and I would not recommend PCI of those now.   The patient was agreeable.  Informed consent was obtained and the left  coronary was intubated with a JL4 6 [place] guiding catheter.  This later had  to be changed to an XB3.5 6 [place] Cordis catheter because of poor backup  trying to cross the tortuous calcified circumflex.   We were able to cross through the previously place zipper stent into the  OM2, were free in the lumen, and the guide wire appeared to be centrally  located and free in the distal OM2 which was markedly tortuous.  There was  calcification of at least 2+ of the mid circumflex in addition to marked  double tortuosity.  We initially crossed with an Asahi soft wire and I was  unable to cross the 2.0 or a 1.5 balloon across the stenosis.  We then changed backup from the JL4 to the XB3.5 guide and rewired the lesion.  Several wires were used in an upgraded fashion including a Crossit moderate  200 support, a PT2 wire however this would not move freely within the  tortuous vessel balloon and had to be removed.  We then recrossed with an  Asahi soft wire, used it over a wire balloon and got close to the lesion.  We then exchanged after trying a Buddy wire on one occasion which was not  successful.  We exchanged for a Prowater Asahi heavier support wire which  was free in the distal OM2, but despite multiple attempts at over-the-wire  crossing with a 1.5 and a 2.0 balloon, this was not successful.  There was  good TIMI 3 flow to the circumflex and OM2, so it was felt best to abandon  the procedure at this point and treat this restenosis which is chronic  medically.  He is a  candidate for staged PCI of his distal RCA in this  setting and the Cine angiograms will be further reviewed.   The failure of PCI was most likely related to the  marked tortuosity of the  circumflex with calcification in this area, difficulty to get good backup  and move the balloon, but moreover the fact that the balloon would not cross  through the struts of zipper stent despite adequate backup and guide wire  support probably related to the geometry of that particular stent and  intestacies.   CATHETERIZATION DIAGNOSES:  1.  Recurrent angina, known coronary artery disease.  2.  Unsuccessful attempt at OM2 in-stent restenosis percutaneous      transluminal coronary angioplasty, lesion crossed with guide wire, but      unable to cross with balloon.  3.  Residual 90% ISR OM2 stent bear metal restenosis.  4.  Hazy 80% proximal PDA stenosis, moderate progression mid RCA.  5.  No restenosis Zipper and Cypher stents from May 29, 2002, PCI mid      circumflex.  6.  No restenosis from mid Express II bear metal stent, November of 2003.  7.  No restenosis LAD Taxus stent from Nov 24, 2003.  8.  Left ventricular function improved, ejection fraction 50%.  9.  Hyperlipidemia.  10. Remote smoker.  11. Moderate right renal artery stenosis, mild left renal artery stenosis,      outpatient Dopplers.  12. Pleural calcification (seen on fluoroscopy) with negative CT scan and      remote asbestos exposure.      RAW/MEDQ  D:  08/02/2004  T:  08/03/2004  Job:  [id number]

## 2010-12-01 NOTE — Cardiovascular Report (Signed)
NAMEVANDY, [name]                           ACCOUNT NO.:  [id number]   MEDICAL RECORD NO.:  [id number]                   PATIENT TYPE:  INP   LOCATION:  6531                                 FACILITY:  [facility]   PHYSICIAN:  Richard A. Alanda Amass, M.D.          DATE OF BIRTH:  October 20, 1940   DATE OF PROCEDURE:  11/24/2003  DATE OF DISCHARGE:                              CARDIAC CATHETERIZATION   PROCEDURE:  1. Retrograde central aortic catheterization.  2. Selective coronary angiography via Judkins technique.  3. LV angiogram RAO/LAO projection.  4. Subselective LIMA.  5. Abdominal aortic angiogram midstream PA projection.  6. Plavix 150 extra to normal dosing.  7. Aggrastat bolus plus infusion.  8. Preoperative Lovenox.  9. IVUS interrogation, subsequent PCTA, small balloon followed by cutting     balloon atherectomy proximal LAD and subsequent 3.5/16 TAXUS stent high     pressure inflation.   PROCEDURE:  The patient was brought to the second floor CP lab in a post  absorptive state after 5 mg Valium p.o. premedication.  He had received  Lovenox approximately last dose 4 hours prior to the procedure, so  additional heparin was held.  He had been on daily aspirin and Plavix and  was given another 150 of Plavix in the lab after diagnostic coronary  angiography.  The CRFA was done through a single anterior puncture using an  18 thin-walled needle and a 6 French short Daig sidearm sheath was inserted  without difficulty.  Catheterization was done with 6 French 4 cm taper  Cordis preformed coronary and pigtail catheters using Omnipaque dye  throughout the procedure.  LV angiogram was done in the RAO and LAO  projections at 25 mL, 14 mL per second, 20 mL, 12 mL per second with bulbar  compression CA showing no gradient across the aortic valve.  Abdominal  aortic angiogram was done in the midstream PA projection at 25 mL, 20 mL per  second.  A second ingestion at 20 mL, 20 mL per second  was done above the  iliac bifurcation.  Catheter was removed, the sheath was flushed pending  review of the patient's cineangiograms.  He tolerated the diagnostic  procedure well.   IMPRESSIONS:  1. LV:  140/0.  2. LVDP:  14 mmHg.  3. CA:  140/70 mmHg.   There is no gradient across the aortic valve on catheter pullback.   Subselective LIMA revealed a widely patent LIMA, a tortuous left subclavian  but no significant stenosis and good flow with an antegrade left vertebral.   Abdominal angiogram demonstrated single renal arteries bilaterally, normal  right renal artery and approximately 30% smooth proximal narrowing of the  left renal artery with large residual vessel.  The infrarenal abdominal  aorta was tortuous but had no significant atherosclerotic disease.  The  aortoiliac bifurcation was widely patent.  Here were mild irregularities of  the distal CIA bilaterally but no stenosis.  The hypogastrics were intact  and the SFA profunda junctions, EIA and common femorals were intact  bilaterally.   Fluoroscopy showed a pleural and possibly diaphragmatic calcification that  had been noted in the past in a plaque-like distribution, predominantly of  the right diaphragmatic subphrenic area.   There was 2+ coronary calcification of the right and 2-3+ calcification of  the proximal LAD.  The previously placed stents were visible  angiographically.   The main left coronary artery was normal.   The first diagonal had about 40% narrowing in its proximal portion but good  flow, no significant stenosis, and erosion in the proximal LAD before S1.   At the second diagonal there was a moderate sized ____________ dissecting  diagonal, it was moderately large, bifurcated, arose at a bend in the LAD  beyond S1 and had no significant stenosis.   A third diagonal arose from the mid LAD and had no significant stenosis.  The LAD was tortuous and calcified and coursed the apex of the heart.    There was an 85% complex calcified eccentric stenosis of the LAD between DX1  and DX2 at the level of the large septal perforator branch.  This  represented significant progression from the patient's prior last angiogram.   The circumflex artery was a large nondominant vessel.  It gave off an atrial  branch proximally, a small OM-1 followed by a double bend in the proximal-  mid circumflex with 30% smooth narrowing.   Just after the midportion the circumflex bifurcated into a large OM-2 and  the distal circumflex. This is the area of prior bifurcation, stenosis and  intense stenting in the past.   The previously placed stents across the OM-2 were widely patent with less  than 10% narrowing.  The stent from the ostia to proximal OM-2 showed  approximately 50% narrowing, was stable and was actually improved from prior  angiography and there was excellent TIMI-3 flow to the OM-2 and the distal  circumflex.   The right coronary was a dominant vessel.  There was 30-40% narrowing in the  proximal third.  The previously placed stent in the mid RCA was widely  patent with essentially less than 10% narrowing.  There was another 30-40%  eccentric narrowing of the RCA beyond the previously stented area, before  the acute margin, and this was unchanged.  The remained of the right was  widely patent with a normal PDA and PLA.   LV angiogram in the RAO and LAO projection did not show any significant  segmental wall motion abnormality.  EF appeared normal.  Calculated EF was  60-70%.  Qualitative EF was approximately 55-60%.  There was no mitral  regurgitation present.  This represents significant improvement in the  patient's LV angiogram over the last 17-18 months.   DISCUSSION:  [name] history is well outlined in the chart and in his H&P.  He  is a married father of two with three grandchildren, an ex-smoker who quit 3- 1/2 years ago.  He initially presented to [place] October, 2003 with symptoms of   congestive heart failure and ischemic equivalents of exertional dyspnea and  episodic jaw discomfort.  He underwent catheterization and had staged  interventions for high grade right and bifurcation circumflex stenosis.  He  had global hypokinesis with an EF of 20-25% on catheterization May 21, 2002, with PA mean pressure of 34, CO/CI of 3.7/1.82 Fick and 5.2/2.5 thermo  with global hypokinesis.  He had IVUS guided  intervention of the RCA in the  hopes that he had hibernating myocardium and he had a large 4.5/12 Scimed  Express-2 stent placed.  He had staged intervention that was preempted  because of recurrent chest pain on May 29, 2002, where he had widely  patent right coronary stent and bifurcation, stenosis, PCI of the circumflex  OM-2 on May 29, 2002.  He had a 2.5/13 Cypher stent placed in the  circumflex with a tandem 3.0/9 Cypher stent of the mid circumflex.  The OM-2  was stented with a 2.25/12 Express-2 stent.   He was restudied by Dr. Jacinto Halim March 24, 2003.  It was felt that he had in-  stent restenosis of the OM-2, but Dr. Jacinto Halim was not able to cross this into  this OM-2 stenosis, so this was abandoned and he is being treated medically.  He has done well up until the last week when he has had recurrent episodes  of angina and the admission was prompted by recurrent jaw pain, recurrent  exertional dyspnea and substernal chest pain.   He has progression of disease in his LAD and no significant restenosis of  his CX, CXOM or RCA stents.  He is a candidate for PCI of the LAD in this  setting.  It was felt that IVUS interrogation was needed in this setting as  well.  We knew there was calcification and tortuosity of the LAD as well  that might limit some access.   The patient had been on aspirin and Plavix at home.  He was given 150 of  Plavix, he was given 25 mcg/kg of Aggrastat bolus plus infusion.  Since he  had had subcutaneous Lovenox prior to the procedure  he was only given 1,000  units of heparin towards the end of the procedure.   The left coronary was initially intubated with a JL4 6 [place] guiding  catheter, however there was poor coaxial backup with this even after  crossing the lesion with a 0.014 wire, so this was changed to a CLS 3.5  Scimed guiding catheter.  The 0.014 sized soft wire was used to traverse the  stenosis.   Additional IVUS interrogation was able to cross the high grade stenosis but  the proximal LAD was visualized with _____________ IVUS catheter.  The  proximal LAD measures 3.3 x 3.9 in a cross sectional area of 9.6 sq mm.   This was used for guiding reference.   The IVUS was then removed.  We were not able to cross with the cutting  balloon so this was removed and the lesion was predilated at medium  pressures with a 2.5 15 Maverick at 8-40 and 8-22.  It was then exchanged for a 3.0/10 Scimed cutting balloon and the calcific stenosis was dilated at  5-49, 6-35.   This was then exchanged for a 3.5/16 Scimed DES TAXUS stent.  This was  positioned fluoroscopically, deployed at 12-50 and redialed at 12-40.  A  final injection after IC nitroglycerin 200 mcg showed excellent angiographic  result with full stent expansion minus 10% residual narrowing, no  dissection.  The stent was well positioned between DX1 and DX2, not  compromising either and the septal perforator was intact.  There was good  TIMI-3 flow to the distal vessel.  The patient had pain during inflations,  relieved with deflations.  He was stable at the end of the procedure and  transferred to the holding area after the sidearm sheath was flushed for  sheath removal and pressure hemostasis.   Of specific note is that the patient has had essentially normalization of  his LV on optimal medical therapy along with revascularization November,  2003.  Today he has had successful DES stenting following cutting balloon  atherectomy of calcific high grade  proximal LAD stenosis that represented  progression of disease.  I would recommend continued medical therapy, long  term Plavix and aspirin, statin and despite normalization of his LV function  I would continue current medical therapy.   CATHETERIZATION DIAGNOSES:  1. ASHD, unstable angina.  2. Successful cutting balloon atherectomy, IVUS-guided PTCA and DES     stenting, high grade calcific proximal LAD stenosis.  3. Remote ischemic cardiomyopathy with staged RCA non-DES stenting and     bifurcation circumflex, OM-2 stenting November, 2003 as outlined above,     no restenosis on this study.  4. Improved LV function from 20-30% November, 2003 to normalization 55-60%     EF angiographically, qualitatively and quantitatively.  5. Remote smoker.  6. Hyperlipidemia.  7. Asymptomatic PVCs.  8. Bilateral pleural calcification with past negative CT of the chest     November, 2003, to be repeated.                                               Richard A. Alanda Amass, M.D.    RAW/MEDQ  D:  11/24/2003  T:  11/24/2003  Job:  [id number]   cc:   Record Room   CP Lab   Darlin Priestly, M.D.  [address], [address]  [place]  [place] [address]  Fax: [phone]   Alan Mulder, M.D.  [address]  [place]  [place] [address]  Fax: [phone]

## 2010-12-01 NOTE — Discharge Summary (Signed)
NAMEAPOLONIO, CUTTING               ACCOUNT NO.:  [id number]   MEDICAL RECORD NO.:  [id number]          PATIENT TYPE:  INP   LOCATION:  2024                         FACILITY:  [facility]   PHYSICIAN:  Richard A. Alanda Amass, M.D.DATE OF BIRTH:  [birth year]   DATE OF ADMISSION:  12/24/2004  DATE OF DISCHARGE:  12/27/2004                                 DISCHARGE SUMMARY   Mr. Pembroke is a 70 year old white male, patient of Dr. Alanda Amass, who came  to the emergency room secondary to fullness in his throat and epigastric  discomfort going on and off for about one week. He has known coronary artery  disease. He was seen by Dr. Elsie Lincoln in the emergency room and decided to be  admitted. He was treated with IV heparin and would need to undergo cardiac  catheterization.   On December 26, 2004, he underwent cardiac catheterization by Dr. Allyson Sabal. His  stents were patent except his OM-1 stent. He had an 80% end-stent  restenosis. He had a 40-50% ostial circumflex lesion. He had some 50%  lesions between his stents in his RCA and he had a 90% ostial diagonal-1  lesion.   Dr. Allyson Sabal felt that he could not determine the culprit lesion and that he  may need an outpatient Cardiolite.   He was seen on December 27, 2004 by Dr. Elsie Lincoln. He wanted to order an H. pylori  test before his discharge. He did not think his lesion in his RCA was  causing the problem and Dr. Elsie Lincoln suggested to the patient that he see Dr.  Marina Goodell. He thought it was more GI related. He did have a history of an ulcer  March 2006 and he had just restarted his aspirin two weeks prior.   On the day of discharge, December 27, 2004, his blood pressure was 109/61, pulse  of 57, respirations 18, temperature is 97.4.   LABORATORY DATA:  Hemoglobin 12.4, hematocrit 36.7, WBC 4.5, platelets  175,000. Sodium was 139, potassium 3.6, BUN 11, creatinine 0.7. AST is 19  and ALT is 20. CK-MBs and troponins were all negative. Total cholesterol was  191, triglycerides  75, HDL was 103, and LDL was 73. Helicobacter pylori is  0.46 which is considered negative. His chest x-ray showed no acute  cardiopulmonary disease, stable cardiomegaly without failure and bilateral  pleural plaques.   DISCHARGE DIAGNOSES:  1.  Chest pain, determined to be noncardiac in origin, though he has several      areas of stenosis.  2.  Arteriosclerotic cardiovascular disease with multiple procedures and      multiple stents in his right coronary artery, circumflex, posterior      descending artery and left anterior descending artery.  3.  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  4.  Hyperlipidemia.  5.  Hypertension.  6.  Hiatal hernia.  7.  Barrett's esophagus.  8.  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  9.  History of  asbestosis.      Lezlie Octave, N.P.      Richard A. Alanda Amass, M.D.  Electronically Signed    BB/MEDQ  D:  03/14/2005  T:  03/15/2005  Job:  [id number]   cc:   Alan Mulder, M.D.  [address]  [place]  [place] [address]  Fax: [phone]   Wilhemina Bonito. Marina Goodell, M.D. [facility]  [address]  [place]  [place] [address]

## 2010-12-01 NOTE — Cardiovascular Report (Signed)
NAMEJOHANNES, EVERAGE                           ACCOUNT NO.:  [id number]   MEDICAL RECORD NO.:  [id number]                   PATIENT TYPE:  INP   LOCATION:  6599                                 FACILITY:  [facility]   PHYSICIAN:  Richard A. Alanda Amass, M.D.          DATE OF BIRTH:  1940-11-10   DATE OF PROCEDURE:  05/29/2002  DATE OF DISCHARGE:                              CARDIAC CATHETERIZATION   PROCEDURE:  1. Retrograde central aortic catheterization.  2. Selective left and right coronary angiography, pre and post intracoronary     nitroglycerin administration.  3. Plavix 150 mg, heparin bolus 7500 units, Aggrastat bolus plus infusion.  4. Complex percutaneous coronary intervention with percutaneous transluminal     coronary angioplasty and subsequent bifurcation and overlap stenting,     circumflex mid--circumflex obtuse marginal #2, using double-wire     technique.   BRIEF HISTORY:  Please refer to prior catheterization report of May 21, 2002, for full details.  Essentially, this 70 year old married father of two  with three grandchildren, ex-smoker with pleural calcification and negative  CT, has six months of progressive symptoms of congestive heart failure.  This is predominantly dyspnea on exertion but he has also had intermittent  exertional chest discomfort compatible with ischemia without known  myocardial infarction.  Catheterization demonstrated severe global  hypokinesis which we felt was out of proportion to his coronary disease with  no evidence of total occlusion or segmental wall motion abnormality and an  EF of 20-25%.  It was not clear how much his coronary disease was  contributing to his cardiomyopathy but with a history of chest pain, we felt  that it was possible that he had hibernating myocardium that could be  recruited.   There was a 70-80% ruptured plaque-type lesion in the proximal RCA that was  stented after IVUS guidance with a 4.5 x 12-mm Express2  stent on May 21, 2002.  A residual 50% eccentric narrowing in the mid RCA was not dilated.  There was good flow to the dominant RCA.   The LAD had 60-70% eccentric smooth narrowing before the diagonal #1 that  was not felt to be critical but the circumflex had a bifurcation stenosis of  90% in the ostium and proximal OM-2 with 85-90% in the circumflex mid at the  bifurcation, both mildly segmental.   We had considered outpatient followup and consideration for staged  intervention in this patient.  He was, however, on medical therapy,  readmitted on May 28, 2002, with chest discomfort, nausea, sweat, and  left upper extremity discomfort that was difficult to distinguish from  radicular pain versus ischemic pain.  He did have substernal pressure  radiating to the neck compatible with ischemia and with his known coronary  disease was started on nitroglycerin and heparin, and Plavix was continued.  It was elected to proceed with intervention in this setting.   DESCRIPTION OF PROCEDURE:  Informed consent was obtained from the patient  and his wife with planned PCI of his bifurcation circumflex disease.  The  case had previously been reviewed with my colleagues, and Dr. Tresa Endo had  reviewed this prior to the patient's discharge at the last hospitalization  and agreed with this approach.   The patient reported to the second floor CP lab.  The left groin was  prepped, draped in the usual manner.  Xylocaine, 1%, was used for local  anesthesia, and he was given 5 mg of Valium p.o. premedication.  The LCFA  was entered with a single anterior puncture using a #18 thin-walled needle.  A #6 French short Daig sidearm sheath was inserted.  Selective left coronary  angiography was done pre and post IC nitroglycerin administration in  multiple projections.  This revealed high-grade bifurcation stenosis of the  circumflex mid and circumflex OM-2.   There was marked posterior angulation of the  circumflex takeoff with a  straight main left and significant tortuosity of the proximal third of the  circumflex.  Because of this relatively unfavorable anatomy, I did not feel  that we would be able to get two stents down to do a crush technique  bifurcation procedure.  I felt that we would have to use one balloon at a  time and preserve the side branch with a guidewire; thus, we used double-  wire technique.   The patient was given 150 mg of Plavix.  He was given IV heparin in bolus  doses during the procedure, monitoring ACTs, totaling 7500 units.  He was  given Aggrastat bolus plus infusion.  The patient received a total of 8 mg  of Nubain in divided doses for back discomfort during the procedure.  IC  nitroglycerin, 200 mcg, was used on several occasions in the left coronary  artery (see report for total).   The left coronary was intubated with a CLS 3.5 Scimed #6 [place] guiding  catheter.  Initially, an Asahi Light Floppy 0.014-inch guidewire was  utilized.  There was some difficulty in negotiating the tortuosity with  this, so this wire was removed.  It was then replaced with an HTF wire,  0.014 Guidant.  This wire was then able to traverse the tortuosity and was  positioned in the OM-2 vessel distally and free fluoroscopically.   A second guidewire was then used which was an HTF Extra Support 0.014-inch  guidewire, and this was positioned in the distal circumflex proper which was  the larger of the two branches.   A 2.0 x 15-mm CrossSail balloon was used to help straighten the guidewire  tip out to get it into the circumflex.  This balloon was then used to dilate  the OM-2 at 7-43 and pulled back, put on the other wire, and then used to  dilate the circumflex at 7-40.  There was associated spasm and recoil  without significant dissection but continued stenosis and it was elected to  proceed with stenting.  Once again, we could not use a crush technique because we did not  feel that simultaneous stents could pass the tortuous  circumflex so a single stent was used.  A 2.5 x 13-mm DES CYPHER stent was  then passed.  This was positioned at the ostia with the markers exactly at  the ostia and 1 mm proximal to the ostia, and was deployed at 10-35, post  dilated at 14-42.  There was probably some mild foreshortening and slippage  distally.  The  stent appeared nicely apposed and deployed; however, there  was a 2-3 mm gap between the ostium of the OM-2 and the proximal portion of  the stent which was unstented.   We then crossed into the OM-2, dilated this with a 2.25 x 15-mm Maverick  balloon at 4-38.  Following this, there was elastic recoil noted and it was  felt best to proceed with stenting of the OM-2 side branch.  This was done  with a 2.25 x 12-mm Express2 stent which was positioned precisely at the  ostia, deployed at 10-36, and post dilated at 12-28.  Angiography showed  excellent apposition of the OM-2 stent covering the ostia.  There was some  pinching with at least 50% stenosis proximal to the mid circumflex stent, so  it was felt that this needed to be dealt with.  We initially dilated this  with a 2.75 x 12-mm Maverick balloon at 7-62.  Despite this, the OM-2 stayed  widely patent; however, there was persistent stenosis with probably  localized dissection just beyond the ostia of the OM-2 in the mid  circumflex.  We did not feel that it was safe to leave this, so we had to  proceed further in the intervention.   We tried to pass a 3.0 x 13-mm CYPHER stent across the circumflex OM-2;  however, this would not pass the proximal circumflex despite the Extra  Support wire.  We then tried to pass a 3.0 x 12-mm Express2 stent across  this area and once again, this would not pass the tortuous circumflex  despite changing guiding catheters and to a new CL 3.5 and rewiring the  circumflex proper.  We then used a buddy wire with a Scimed 0.014-inch down  the  circumflex proper.  We then dilated with a 3.0 x 12-mm Quantum Maverick  at 7-34 and 8-60.  After dilatation, the side branch OM-2 remained widely  patent and we tried to pass the Express stent again but this would not pass  the proximal tortuous circumflex, so this had to be abandoned.   We then prepared a 3.0 x 9-mm Medtronic Zipper AVE stent.  We were able to  pass this stent through the tortuous circumflex and overlap the previously  placed circumflex OM stent and stent across the OM-2 origin which we felt  was safe since this branch was widely patent.  This stent was deployed at 9-  34 and post dilated with 10-37.   Final injections demonstrated excellent angiographic result with full  coverage of the OM-2 ostia and full coverage of the mid circumflex with  stent overlapping the OM-2 origin as a final result.  The stenosis in the OM-  2 was reduced from 90% to 0%, and the stenosis in the mid circumflex was reduced from 85% to 0%.  There was excellent TIMI-2 flow into3 both distal  branches.   The patient tolerated the procedure well.   We then did single selective right coronary injection to assess his RCA  stent.  This was placed May 31, 2002.  This was widely patent.  There  was residual 50% smooth narrowing of the mid RCA but good flow to the distal  vessel which was dominant.  Final ACT was 255 seconds.  The catheter was  removed.  Sidearm sheath was flushed through to the skin to prevent  migration.  The patient was transferred to the holding area for  postoperative care.  We plan to pull the sheath when the  ACT falls to a  normal range, continue Aggrastat IIb/IIIa infusion for 18 hours, continue  aspirin and Plavix.   CATHETERIZATION DIAGNOSES:  1. Successful bifurcation stenting, circumflex mid--circumflex obtuse     marginal #2, as outlined above, for recurrent unstable angina without     myocardial infarction, hospital admission May 28, 2002.  2. Status post  right coronary artery proximal stent, May 21, 2002, drug-     eluting stent, widely patent on this study today.  3. Severe left ventricular dysfunction on prior angiogram, global     hypokinesis, compatible with nonischemic cardiomyopathy.  4. Nonsustained asymptomatic ventricular tachycardia treated medically with     beta blockers (EPS consult of pain).  5. Past cigarette abuse, discontinued x five years.  6. Hyperlipidemia.  7. 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    RECOMMENDATIONS:  I will plan to continue medical therapy of his LV  dysfunction and SVT with titration of beta blockers.  With successful  revascularization of the RCA, circumflex, and circumflex OM-2, after  stabilization we will try to assess him further with a 2D echocardiogram  and/or Cardiolite for ischemia and possible viability.                                                Richard A. Alanda Amass, M.D.    RAW/MEDQ  D:  05/29/2002  T:  05/29/2002  Job:  [id number]   cc:   Cardiac Catheterization Lab   Alan Mulder, M.D.  [address]  [place]  [place] [address]  Fax: [phone]   Darlin Priestly, M.D.  [id number], [address]  [place]  [place] [address]  Fax: [phone]

## 2010-12-01 NOTE — Discharge Summary (Signed)
[name], [name]                           ACCOUNT NO.:  [id number]   MEDICAL RECORD NO.:  [id number]                   PATIENT TYPE:  INP   LOCATION:  6523                                 FACILITY:  [facility]   PHYSICIAN:  Richard A. Alanda [name], M.D.          DATE OF BIRTH:  01-Jun-1941   DATE OF ADMISSION:  03/23/2003  DATE OF DISCHARGE:  03/25/2003                                 DISCHARGE SUMMARY   DISCHARGE DIAGNOSES:  1. Unstable angina with known coronary disease, catheterization, this     admission, but unable to cross the circumflex stent, plan for medical     therapy.  2. Known coronary disease with previous stent placement to the right     coronary artery and circumflex, November of 2003.  3. Nonsustained ventricular tachycardia.  4. Good left ventricular function with an ejection fraction of 50%.  5. Treated hypertension.  6. Treated hyperlipidemia.   HOSPITAL COURSE:  The patient is a 70 year old male with a history of  coronary disease and nonsustained ventricular tachycardia.  He was admitted  to [facility] Emergency Room with chest pain.  Symptoms were consistent  with unstable angina.  He has a history of coronary disease and had had an  RCA stent, May 21, 2002, and a circumflex stent, May 29, 2002.  He  has been seen in the past by the EP service for nonsustained ventricular  tachycardia, which is asymptomatic; plan has been for beta blocker  treatment.  The patient was admitted to telemetry by Dr. Gaspar Garbe B. Little,  started on IV heparin and set up for diagnostic catheterization.  CK-MB and  troponins were negative.  Catheterization done, March 24, 2003, by Dr.  Cristy Hilts. Ganji revealed a patent, previously placed Express RCA stent.  LAD  had a 40% eccentric narrowing, otherwise, no obstruction.  The circumflex  had patent CYPHER stent with an occluded in-stent restenosis of an OM  branch; I believe this was an Express stent.  Dr. Jacinto Halim was unable to  cross  the OM lesion.  Plan is for continued medical therapy.  The patient was  discharged March 20, 2003 in stable condition.   DISCHARGE MEDICATIONS:  1. Lipitor 40 mg h.s.  2. Altace 5 mg a day.  3. Coated aspirin once a day.  4. Aldactone 25 mg a day.  5. Lasix 20 mg a day.  6. Plavix 75 mg a day.  7. Coreg 12.5 mg b.i.d.  8. Imdur 30 mg a day.  9. Nitroglycerin sublingual as needed (p.r.n.).   LABORATORY AND ACCESSORY CLINICAL DATA:  EKG shows sinus rhythm with PVCs,  sinus bradycardia.   White count 5.4, hemoglobin 12.9, hematocrit 37.8, platelets 165,000.  INR  0.9.  Sodium 142, potassium 4.4, BUN 12, creatinine 0.9.  Liver functions  are normal.  CK-MB and troponins are negative x3.   Chest x-ray was done on admission; the results are not  in the chart at the  time of this dictation and will be tracked down and an addendum will be  dictated regarding this.      [name], P.A.                      Richard A. Alanda [name], M.D.    [name]  D:  03/31/2003  T:  04/01/2003  Job:  [id number]   cc:   [name], M.D.  [phone] [address]  [place]  [place] [address]  Fax: [phone]

## 2010-12-01 NOTE — Discharge Summary (Signed)
[name], [name]                           ACCOUNT NO.:  [id number]   MEDICAL RECORD NO.:  [id number]                   PATIENT TYPE:  INP   LOCATION:  6531                                 FACILITY:  [facility]   PHYSICIAN:  [name], M.D.          DATE OF BIRTH:  07-07-1941   DATE OF ADMISSION:  11/24/2003  DATE OF DISCHARGE:  11/25/2003                                 DISCHARGE SUMMARY   DISCHARGE DIAGNOSES:  1. Unstable angina, left anterior descending Taxus stent placed this     admission.  2. Known coronary disease with previous circumflex and right coronary artery     stenting November 2003, patent at catheterization this admission.  3. Normal left ventricular function.  4. Hyperlipidemia, treated.  5. History of asbestosis, CAT scan of the chest done this admission; results     are pending at discharge.  6. Degenerative joint disease.   HOSPITAL COURSE:  The patient is a 70 year old male followed by Dr.  Alanda [name] and [name] with a history of coronary disease.  In November  he had a catheterization with an RCA stent and circumflex stenting at the OM-  2 bifurcation.  He has been doing well as an outpatient.  He presented on  Nov 24, 2003 with shortness of breath, his anginal equivalent.  He was  admitted to telemetry.  CK MB and troponins were obtained and he was put on  Lovenox.  CK MB and troponins were negative.  He was catheterized by Dr.  Alanda [name] on Nov 24, 2003.  This revealed an 85% proximal LAD lesion after  the first diagonal.  This was dilated and stented with a Taxus stent.  The  previously placed circumflex and OM-2 bifurcation stenting was patent; the  previously placed RCA stent was patent.  His EF was 55 to 65%.  He tolerated  the procedure well.  He was up and ambulating on the 12th and we feel that  he can be discharged.  Dr. Alanda [name] does want to get a CT scan of his  chest; he has a history of asbestosis.  I believe in the past, CAT  scans  have shown asymptomatic pleural calcification; that is to be followed.  He  will see Dr. Alanda [name] in follow-up in the next couple of weeks.   DISCHARGE MEDICATIONS:  1. Coated aspirin once a day.  2. Plavix 75 mg a day.  3. Lipitor 40 mg a day.  4. Altace 5 mg a day.  5. Protonix 40 mg a day.  6. Aldactone 25 mg a day.  7. Lasix 20 mg a day.  8. Coreg 12.5 mg b.i.d.  9. Nitroglycerin sublingual p.r.n.   LABORATORIES:  Sodium 139, potassium 3.8, BUN 9, creatinine 0.8.  White  count 4.8, hemoglobin 12.2, hematocrit 35.2, platelets 172, INR 1.0.  D-  Dimer 0.26.  Lipid profile shows a cholesterol of 139, HDL 49, LDL 74.  Chest x-ray shows there were calcified bilateral pleural plaques consistent  with prior asbestos exposure; no active disease.  CT scan of the chest was  done prior to discharge and results are pending.  EKG shows sinus rhythm,  sinus brady without acute changes.   DISPOSITION:  The patient is discharged in stable condition and will follow-  up with Dr. Alanda [name] in a couple of weeks.  His CT scan results can be  followed up at that time.      [name], P.A.                      [name], M.D.    [name]  D:  11/25/2003  T:  11/25/2003  Job:  [id number]   cc:   Alan Mulder, M.D.  [address]  [place]  [place] [address]  Fax: [phone]

## 2010-12-01 NOTE — Discharge Summary (Signed)
   NAMEFREMAN, LAPAGE                           ACCOUNT NO.:  [id number]   MEDICAL RECORD NO.:  [id number]                   PATIENT TYPE:  INP   LOCATION:  6523                                 FACILITY:  [facility]   PHYSICIAN:  Richard A. Alanda Amass, M.D.          DATE OF BIRTH:  1941-01-29   DATE OF ADMISSION:  05/28/2002  DATE OF DISCHARGE:  05/30/2002                                 DISCHARGE SUMMARY   DISCHARGE DIAGNOSES:  1. Unstable angina, status post circumflex and obtuse marginal 2 stenting     with an Express and CYPHER stent this admission.  2. Recent right coronary artery intervention May 21, 2002.  3. Left ventricular dysfunction with an ejection fraction of 20-25%.  4. Nonsustained ventricular tachycardia, seen by electrophysiology, no     automatic implanted cardioverter defibrillator recommended at this point.   HOSPITAL COURSE:  The patient is a 70 year old male who was just discharged  after PCI and stenting to his RCA.  He was to come back for a staged  intervention to the circumflex and OM 2 when he developed recurrent chest  pain consistent with unstable angina.  He was seen and evaluated for further  evaluation.  He was admitted to telemetry, put on heparin, and underwent  catheterization and subsequent OM 2 and circumflex stenting by Dr. Alanda Amass  as described above.  The patient tolerated the procedure well and was stable  postprocedure.  The RCA stent placed previously was widely patent.  He was  put on Aggrastat for 18 hours and then aspirin and Plavix.  His Coreg was  increased prior to discharge.   DISCHARGE MEDICATIONS:  1. Coreg 12.5 twice a day.  2. Aspirin 81 mg a day.  3. Aldactone 12.5 a day.  4. Protonix 40 mg a day.  5. Altace 5 mg a day.  6. Lasix 20 mg a day.  7. Lipitor 20 mg a day.  8. Plavix 75 mg a day.   LABORATORY DATA:  Chest x-ray shows cardiomegaly.  No active disease.   EKG shows sinus rhythm.  Septal Q-wave.   White count  4.7, hemoglobin 12.5, hematocrit 37.3, platelets 188.  Sodium  140, potassium 3.9, BUN 10, creatinine 0.8.  Enzymes are negative.    CONDITION ON DISCHARGE:  The patient is discharged in stable condition.   FOLLOW-UP:  He will follow up in Dr. Kandis Cocking office in two weeks.     [name], P.A.                      Richard A. Alanda Amass, M.D.    [name]  D:  06/22/2002  T:  06/23/2002  Job:  [id number]   cc:   Alan Mulder, M.D.  [address]  [place]  [place] [address]  Fax: [phone]

## 2010-12-01 NOTE — Discharge Summary (Signed)
   NAMEDUAN, SCHARNHORST                           ACCOUNT NO.:  [id number]   MEDICAL RECORD NO.:  [id number]                   PATIENT TYPE:  INP   LOCATION:  6523                                 FACILITY:  [facility]   PHYSICIAN:  Richard A. Alanda Amass, M.D.          DATE OF BIRTH:  [birth year]   DATE OF ADMISSION:  03/23/2003  DATE OF DISCHARGE:  03/25/2003                                 DISCHARGE SUMMARY   ADDENDUM:  Chest x-ray report from March 23, 2003 showed pleural plaque  on the right diaphragmatic leaflet as noted, as before.  Heart size is upper  limits of normal and lungs are clear, impression was stable chest since  May 28, 2002.      [name], P.A.                      Richard A. Alanda Amass, M.D.    [name]  D:  03/31/2003  T:  04/01/2003  Job:  [id number]

## 2010-12-01 NOTE — Cardiovascular Report (Signed)
NAMEDONEL, OSOWSKI NO.:  [id number]   MEDICAL RECORD NO.:  [id number]          PATIENT TYPE:  INP   LOCATION:  2024                         FACILITY:  [facility]   PHYSICIAN:  Nanetta Batty, M.D.   DATE OF BIRTH:  1940-07-22   DATE OF PROCEDURE:  12/26/2004  DATE OF DISCHARGE:                              CARDIAC CATHETERIZATION   PROCEDURE:  Cardiac catheterization.   CARDIOLOGIST:  Nanetta Batty, M.D.   INDICATIONS FOR PROCEDURE:  Mr. Selsor is a 70 year old gentleman with a  history of multiple PCI's in the past by Dr. Pearletha Furl. Alanda Amass.  Some of  the problems include hypertension, hyperlipidemia and a hiatal hernia with  reflux and Barrett's esophagitis.  He was last studied on August 03, 2004,  by Dr. Alanda Amass, at which time he had attempted recanalization of his OM  stent for in-stent restenosis, and this was unsuccessful because of the  inability to cross the ostium of the OM, probably because of jailing.  He  then had stenting of the mid-right and ostium of the PDA.  He was admitted  on December 24, 2004, with unstable angina.  He ruled out for a myocardial  infarction.  He presents now for a diagnostic coronary arteriography to  define his anatomy.   DESCRIPTION OF PROCEDURE:  The patient was brought to the second floor [facility]iac [facility] in the post-  absorptive state.  He was premedicated with p.o. Valium.  His right groin is  prepped and shaved in the usual sterile fashion.  Xylocaine 1% was used for  local anesthesia.  A 6-French sheath was inserted into the right femoral  artery using the standard Seldinger technique.  The 6-French right and left  Judkins diagnostic catheters along with a 6-French pigtail catheter were  used for selective coronary angiography, left ventriculography and distal  abdominal aortography.  Visipaque dye was used for the entirety of the case.  Retrograde aortic and  ventricular pullback pressures were recorded.   PRESSURES:  Aortic systolic pressure:  145.  Diastolic pressure:  79.  Left ventricular systolic pressure:  141.  End diastolic pressure:  10.   SELECTIVE CORONARY ANGIOGRAPHY:  1.  LEFT MAIN CORONARY ARTERY:  The left main coronary artery is normal.  2.  LEFT ANTERIOR DESCENDING CORONARY ARTERY:  The proximal left anterior      descending coronary artery stent was widely patent.  There was a very      focal area distal to the stent which was hypo-dense and had      approximately a 60% stenosis on a bend involving the ostium of the first      diagonal branch, which was small to medium in size.  This branch had a      90% ostial stenosis and was unchanged from the prior catheterization six      months ago.  It came off at a right angle.  3.  LEFT CIRCUMFLEX CORONARY ARTERY:  The left circumflex coronary artery      has a 40%-50% ostial.  The AV groove circumflex  was widely patent.  The      OM stent had in-stent restenosis in the 80% range, unchanged from the      prior angiogram.  4.  RIGHT CORONARY ARTERY:  The right coronary artery is a large dominant      vessel with 50% segmental ostial/proximal stenosis.  Otherwise damping      of the 6-French catheter which improved after the initiation of intra-      coronary nitroglycerin. The proximal RCA and mid-RCA and proximal PDA      stents were widely patent.  There was a 50% segmental stenosis in the      proximal to mid-portion of the RCA between the proximal and mid-stent.   LEFT VENTRICULOGRAPHY:  The RAO left ventriculogram is performed using 20 mL  of Visipaque dye at 10 mL per second.  The overall LVEF is estimated at  greater than 55% with focal wall motion abnormalities.   DISTAL ABDOMINAL AORTOGRAPHY:  The distal abdominal aortogram was performed  using 20 mL of Visipaque dye at 20 mL per second.  There was approximately  40% left renal artery stenosis.  The infra-renal  abdominal aorta and the  bifurcation appeared free of significant atherosclerotic changes.   IMPRESSION:  Mr. Gilbo has patent stents with unchanged anatomy from his  previous study six months ago.  The only area of question is the proximal  left anterior descending coronary artery just distal to the proximal stent  involving the takeoff of the first small diagonal branch; however,  angiographically this appears to be very similar to his anatomy six months  ago.  Therefore, I cannot definitely identify a culprit lesion.[name]   RECOMMENDATIONS:  Therefore, I am going to discontinue his heparin and  nitroglycerin and treat him medically.  Will increase his proton pump  inhibition.  Dr. Alanda Amass will review his films.  If he remains clinically  stable, overnight, then we will discharge him in the morning for outpatient  functional testing.  He left the laboratory in stable condition.       JB/MEDQ  D:  12/26/2004  T:  12/26/2004  Job:  [id number]   cc:   Cardiac Cath Lab  -  [facility]

## 2011-04-10 LAB — POCT I-STAT, CHEM 8
Creatinine, Ser: 0.9
Glucose, Bld: 91
Hemoglobin: 13.9
Sodium: 141
TCO2: 29

## 2011-04-20 LAB — CBC
HCT: 41.9
Hemoglobin: 13.1
Hemoglobin: 14.3
MCHC: 34.2
MCV: 99.9
RBC: 3.85 — ABNORMAL LOW
RBC: 4.2 — ABNORMAL LOW
RDW: 13.9

## 2011-04-20 LAB — BASIC METABOLIC PANEL
CO2: 27
Calcium: 8.2 — ABNORMAL LOW
Chloride: 103
GFR calc Af Amer: >60
GFR calc Af Amer: >60
GFR calc non Af Amer: >60
Glucose, Bld: 97
Sodium: 140
Sodium: 141

## 2011-04-20 LAB — CK TOTAL AND CKMB (NOT AT ARMC)
CK, MB: 1.3
Total CK: 55

## 2011-04-24 LAB — PROTIME-INR
INR: 0.9
Prothrombin Time: 12.8

## 2011-09-03 ENCOUNTER — Ambulatory Visit: Payer: Self-pay | Admitting: Unknown Physician Specialty

## 2011-09-05 LAB — PATHOLOGY REPORT

## 2012-08-15 ENCOUNTER — Encounter: Payer: Self-pay | Admitting: Internal Medicine

## 2012-08-15 DIAGNOSIS — D329 Benign neoplasm of meninges, unspecified: Secondary | ICD-10-CM

## 2012-08-15 HISTORY — PX: CRANIOTOMY: SHX93

## 2012-08-15 HISTORY — DX: Benign neoplasm of meninges, unspecified: D32.9

## 2012-08-18 ENCOUNTER — Other Ambulatory Visit (HOSPITAL_COMMUNITY): Payer: Self-pay | Admitting: Cardiology

## 2012-08-18 DIAGNOSIS — I6529 Occlusion and stenosis of unspecified carotid artery: Secondary | ICD-10-CM

## 2012-08-18 DIAGNOSIS — I4891 Unspecified atrial fibrillation: Secondary | ICD-10-CM

## 2012-08-18 DIAGNOSIS — E782 Mixed hyperlipidemia: Secondary | ICD-10-CM

## 2012-08-26 ENCOUNTER — Other Ambulatory Visit (HOSPITAL_COMMUNITY): Payer: Self-pay | Admitting: Cardiovascular Disease

## 2012-08-26 DIAGNOSIS — I472 Ventricular tachycardia: Secondary | ICD-10-CM

## 2012-08-26 DIAGNOSIS — I2510 Atherosclerotic heart disease of native coronary artery without angina pectoris: Secondary | ICD-10-CM

## 2012-08-27 ENCOUNTER — Ambulatory Visit (HOSPITAL_COMMUNITY)
Admission: RE | Admit: 2012-08-27 | Discharge: 2012-08-27 | Disposition: A | Discharge status: Home / Self Care | Payer: Medicare Other | Source: Ambulatory Visit | Attending: Cardiovascular Disease | Admitting: Cardiovascular Disease

## 2012-08-27 DIAGNOSIS — I472 Ventricular tachycardia, unspecified: Secondary | ICD-10-CM | POA: Insufficient documentation

## 2012-08-27 DIAGNOSIS — I6529 Occlusion and stenosis of unspecified carotid artery: Secondary | ICD-10-CM | POA: Insufficient documentation

## 2012-08-27 DIAGNOSIS — I4891 Unspecified atrial fibrillation: Secondary | ICD-10-CM

## 2012-08-27 DIAGNOSIS — I059 Rheumatic mitral valve disease, unspecified: Secondary | ICD-10-CM | POA: Insufficient documentation

## 2012-08-27 DIAGNOSIS — E782 Mixed hyperlipidemia: Secondary | ICD-10-CM | POA: Insufficient documentation

## 2012-08-27 DIAGNOSIS — I2510 Atherosclerotic heart disease of native coronary artery without angina pectoris: Secondary | ICD-10-CM

## 2012-08-27 DIAGNOSIS — I379 Nonrheumatic pulmonary valve disorder, unspecified: Secondary | ICD-10-CM | POA: Insufficient documentation

## 2012-08-27 DIAGNOSIS — I10 Essential (primary) hypertension: Secondary | ICD-10-CM | POA: Insufficient documentation

## 2012-08-27 DIAGNOSIS — I4729 Other ventricular tachycardia: Secondary | ICD-10-CM | POA: Insufficient documentation

## 2012-08-27 DIAGNOSIS — I369 Nonrheumatic tricuspid valve disorder, unspecified: Secondary | ICD-10-CM | POA: Insufficient documentation

## 2012-08-27 NOTE — Progress Notes (Signed)
2D Echo Performed 08/27/2012    Rubert Frediani, RCS  

## 2012-08-27 NOTE — Progress Notes (Signed)
Carotid Duplex Complete [name] 

## 2012-08-28 ENCOUNTER — Encounter (HOSPITAL_COMMUNITY): Payer: Self-pay

## 2012-09-03 ENCOUNTER — Ambulatory Visit (HOSPITAL_COMMUNITY)
Admission: RE | Admit: 2012-09-03 | Discharge: 2012-09-03 | Disposition: A | Discharge status: Home / Self Care | Payer: Medicare Other | Source: Ambulatory Visit | Attending: Cardiovascular Disease | Admitting: Cardiovascular Disease

## 2012-09-03 DIAGNOSIS — I472 Ventricular tachycardia: Secondary | ICD-10-CM

## 2012-09-03 DIAGNOSIS — I2510 Atherosclerotic heart disease of native coronary artery without angina pectoris: Secondary | ICD-10-CM | POA: Insufficient documentation

## 2012-09-03 DIAGNOSIS — I739 Peripheral vascular disease, unspecified: Secondary | ICD-10-CM | POA: Insufficient documentation

## 2012-09-03 DIAGNOSIS — I10 Essential (primary) hypertension: Secondary | ICD-10-CM | POA: Insufficient documentation

## 2012-09-03 DIAGNOSIS — R42 Dizziness and giddiness: Secondary | ICD-10-CM | POA: Insufficient documentation

## 2012-09-03 MED ORDER — REGADENOSON 0.4 MG/5ML IV SOLN
0.4000 mg | Freq: Once | INTRAVENOUS | Status: AC
  Administered 2012-09-03: 0.4 mg via INTRAVENOUS

## 2012-09-03 MED ORDER — TECHNETIUM TC 99M SESTAMIBI GENERIC - CARDIOLITE
30.3000 | Freq: Once | INTRAVENOUS | Status: AC | PRN
  Administered 2012-09-03: 30.3 via INTRAVENOUS

## 2012-09-03 MED ORDER — TECHNETIUM TC 99M SESTAMIBI GENERIC - CARDIOLITE
10.4000 | Freq: Once | INTRAVENOUS | Status: AC | PRN
  Administered 2012-09-03: 10 via INTRAVENOUS

## 2012-09-03 NOTE — Procedures (Addendum)
[facility] [address] [place] [address] [phone]  Cardiology Nuclear Med Study  [name] is a 72 y.o. male     MRN : [id number]     DOB: September 26, 1940  Procedure Date: 09/03/2012  Nuclear Med Background Indication for Stress Test:  Stent Patency History:  CAD, Stent placement 2005 Cardiac Risk Factors: History of Smoking, Hypertension, Lipids, PVD and TIA  Symptoms:  Dizziness   Nuclear Pre-Procedure Caffeine/Decaff Intake:  1:00am NPO After: 11:00am   IV Site: R Antecubital  IV 0.9% NS with Angio Cath:  22g  Chest Size (in):  42 IV Started by: Koren Shiver, CNMT  Height: 5\' 11"  (1.803 m)  Cup Size: n/a  BMI:  Body mass index is 26.37 kg/(m^2). Weight:  189 lb (85.73 kg)   Tech Comments:  n/a    Nuclear Med Study 1 or 2 day study: 1 day  Stress Test Type:  Lexiscan  Order Authorizing Provider:  Nanetta Batty, MD   Resting Radionuclide: Technetium 47m Sestamibi  Resting Radionuclide Dose: 10.4 mCi   Stress Radionuclide:  Technetium 72m Sestamibi  Stress Radionuclide Dose: 30.3 mCi           Stress Protocol Rest HR: 58 Stress HR:88  Rest BP: 136/86 Stress BP: 120/86  Exercise Time (min): n/a METS: n/a          Dose of Adenosine (mg):  n/a Dose of Lexiscan: 0.4 mg  Dose of Atropine (mg): n/a Dose of Dobutamine: n/a mcg/kg/min (at max HR)  Stress Test Technologist: Ernestene Mention, CCT Nuclear Technologist: Gonzella Lex, CNMT   Rest Procedure:  Myocardial perfusion imaging was performed at rest 45 minutes following the intravenous administration of Technetium 29m Sestamibi. Stress Procedure:  The patient received IV Lexiscan 0.4 mg over 15-seconds.  Technetium 67m Sestamibi injected at 30-seconds.  There were no significant changes with Lexiscan.  Quantitative spect images were obtained after a 45 minute delay.  Transient Ischemic Dilatation (Normal <1.22):  1.02 Lung/Heart Ratio (Normal <0.45):   0.35 QGS EDV:  134 ml QGS ESV:  82 ml LV Ejection Fraction: 39%  Signed by      Rest ECG: NSR - Normal EKG and PAC's  Stress ECG: No significant change from baseline ECG, There are scattered PVC's and  PAC's.  QPS Raw Data Images:  Normal; no motion artifact; normal heart/lung ratio. Stress Images: Mild thinning in the basal inferior wall; otherwise, normal homogeneous uptake in other areas of the myocardium. Rest Images:  There is mild basal inferior thinning with normal uptake in other regions. Subtraction (SDS):  No evidence of ischemia.  Impression Exercise Capacity:  Lexiscan with no exercise. BP Response:  Normal blood pressure response. Clinical Symptoms:  No significant symptoms noted. ECG Impression:  No significant ST segment change suggestive of ischemia. Comparison with Prior Nuclear Study: No significant change in perfusion from previous study; however, EF has decreased from 46%.  Overall Impression:  Low risk stress nuclear study demonstrating basal diaphragmatic attenuation versus a region of basal nontransmural  inferior scar.   LV Wall Motion:  Moderate LV dysfunction with EF 39% with diffuse hypocontractility.   Lennette Bihari, MD  09/03/2012 6:32 PM

## 2012-09-30 ENCOUNTER — Ambulatory Visit: Payer: Self-pay | Admitting: Cardiovascular Disease

## 2012-09-30 DIAGNOSIS — I471 Supraventricular tachycardia: Secondary | ICD-10-CM | POA: Insufficient documentation

## 2012-09-30 DIAGNOSIS — I4891 Unspecified atrial fibrillation: Secondary | ICD-10-CM

## 2012-09-30 DIAGNOSIS — Z7901 Long term (current) use of anticoagulants: Secondary | ICD-10-CM

## 2012-10-23 ENCOUNTER — Encounter: Payer: Self-pay | Admitting: Internal Medicine

## 2012-10-23 ENCOUNTER — Ambulatory Visit (INDEPENDENT_AMBULATORY_CARE_PROVIDER_SITE_OTHER): Payer: Medicare Other | Admitting: Internal Medicine

## 2012-10-23 VITALS — BP 136/82 | HR 60 | Ht 71.0 in | Wt 189.3 lb

## 2012-10-23 DIAGNOSIS — I25810 Atherosclerosis of coronary artery bypass graft(s) without angina pectoris: Secondary | ICD-10-CM

## 2012-10-23 DIAGNOSIS — I4891 Unspecified atrial fibrillation: Secondary | ICD-10-CM

## 2012-10-23 DIAGNOSIS — I2510 Atherosclerotic heart disease of native coronary artery without angina pectoris: Secondary | ICD-10-CM | POA: Insufficient documentation

## 2012-10-23 DIAGNOSIS — I472 Ventricular tachycardia, unspecified: Secondary | ICD-10-CM

## 2012-10-23 NOTE — Assessment & Plan Note (Signed)
The patient is asymptomatic and has mild to moderate left ventricular dysfunction. At this point, there is no indication to continue sotalol. He is not symptomatic and sotalol has not been shown to prevent sudden cardiac death. The patient is on carvedilol and an ACE inhibitor. I would strongly recommend that he continue these medications. It might be that his carvedilol could be uptitrated with discontinuation of sotalol. I discussed the warning signs to look for if he were to develop symptomatic ventricular tachycardia. He currently does not have palpitations or syncope. He will undergo watchful waiting.

## 2012-10-23 NOTE — Assessment & Plan Note (Signed)
I have reviewed his cardiac monitor. The limited strips that I have to review do not demonstrate atrial fibrillation, though there is clear evidence of SVT.

## 2012-10-23 NOTE — Assessment & Plan Note (Signed)
He will continue his current medical therapy. I agree with his primary cardiologist recommendation. At this time no indication to repeat catheterization. He will continue his very vigorous exercise program.

## 2012-10-23 NOTE — Progress Notes (Signed)
HPI [name] is referred today for evaluation of NSVT in the setting of CAD and mild LV dysfunction. The patient has a long-standing history of coronary artery disease. In the past, his left ventricular dysfunction has been fairly severe, with revascularization, and medical therapy, his ejection fraction is approximately 45%. He has never had syncope. The patient does not have palpitations. Cardiac monitoring has demonstrated runs of nonsustained ventricular tachycardia. The patient is able to exercise without limitation and does so approximately 5 times per week. His only complaint today is intermittent blurred vision in his left eye. The etiology is unclear. He does note that he had a meningioma removed several weeks ago. This was also asymptomatic. Because of his nonsustained ventricular tachycardia, he was placed on sotalol. He is tolerated this medication without difficulty. No Known Allergies   Current Outpatient Prescriptions  Medication Sig Dispense Refill  . aspirin 81 MG tablet Take 81 mg by mouth daily.      [name] atorvastatin (LIPITOR) 80 MG tablet Take 80 mg by mouth daily.      . calcium-vitamin D (OSCAL WITH D) 500-200 MG-UNIT per tablet Take 1 tablet by mouth daily.      . carvedilol (COREG) 3.125 MG tablet Take 3.125 mg by mouth 2 (two) times daily with a meal.       . lisinopril (PRINIVIL,ZESTRIL) 20 MG tablet Take 10 mg by mouth daily.      . Multiple Vitamins-Minerals (MULTIVITAMIN WITH MINERALS) tablet Take 1 tablet by mouth daily.      [name] omeprazole (PRILOSEC) 20 MG capsule Take 20 mg by mouth 4 (four) times daily.      . sotalol (BETAPACE) 80 MG tablet Take 80 mg by mouth 2 (two) times daily.       [name] warfarin (COUMADIN) 4 MG tablet        No current facility-administered medications for this visit.     History reviewed. No pertinent past medical history.  ROS:   All systems reviewed and negative except as noted in the HPI.   History reviewed. No pertinent past surgical  history.   No family history on file.   History   Social History  . Marital Status: Widowed    Spouse Name: N/A    Number of Children: N/A  . Years of Education: N/A   Occupational History  . Not on file.   Social History Main Topics  . Smoking status: Former Games developer  . Smokeless tobacco: Not on file  . Alcohol Use: Yes  . Drug Use: No  . Sexually Active: Not on file   Other Topics Concern  . Not on file   Social History Narrative  . No narrative on file     BP 136/82  Pulse 60  Ht 5\' 11"  (1.803 m)  Wt 189 lb 4.8 oz (85.866 kg)  BMI 26.41 kg/m2  Physical Exam:  Well appearing 72 year old man, NAD HEENT: Unremarkable Neck:  6 cm JVD, no thyromegally Back:  No CVA tenderness Lungs:  Clear with no wheezes, rales, or rhonchi. HEART:  Regular rate rhythm, no murmurs, no rubs, no clicks Abd:  soft, positive bowel sounds, no organomegally, no rebound, no guarding Ext:  2 plus pulses, no edema, no cyanosis, no clubbing Skin:  No rashes no nodules Neuro:  CN II through XII intact, motor grossly intact  EKG - normal sinus rhythm with sinus bradycardia and PACs  DEVICE  Normal device function.  See PaceArt for details.   Assess/Plan:

## 2012-10-23 NOTE — Patient Instructions (Addendum)
Your physician recommends that you schedule a follow-up appointment as needed  

## 2012-11-28 ENCOUNTER — Telehealth: Payer: Self-pay | Admitting: Cardiovascular Disease

## 2012-11-28 NOTE — Telephone Encounter (Signed)
Faxed echo, last office note and monitor results to [employer] at the [place].  ST 11-28-12.

## 2012-12-13 ENCOUNTER — Telehealth: Payer: Self-pay | Admitting: Cardiovascular Disease

## 2012-12-13 NOTE — Telephone Encounter (Signed)
Left msg for [name] to call me back about scheduling his appointment

## 2012-12-17 ENCOUNTER — Ambulatory Visit: Payer: PRIVATE HEALTH INSURANCE | Admitting: Pharmacist Clinician (PhC)/ Clinical Pharmacy Specialist

## 2012-12-19 ENCOUNTER — Ambulatory Visit (INDEPENDENT_AMBULATORY_CARE_PROVIDER_SITE_OTHER): Payer: Medicare Other | Admitting: Cardiology

## 2012-12-19 ENCOUNTER — Encounter: Payer: Self-pay | Admitting: Cardiology

## 2012-12-19 ENCOUNTER — Ambulatory Visit (INDEPENDENT_AMBULATORY_CARE_PROVIDER_SITE_OTHER): Payer: Medicare Other | Admitting: Pharmacist Clinician (PhC)/ Clinical Pharmacy Specialist

## 2012-12-19 VITALS — BP 130/70 | HR 66 | Ht 71.0 in | Wt 186.1 lb

## 2012-12-19 DIAGNOSIS — I10 Essential (primary) hypertension: Secondary | ICD-10-CM | POA: Insufficient documentation

## 2012-12-19 DIAGNOSIS — I25810 Atherosclerosis of coronary artery bypass graft(s) without angina pectoris: Secondary | ICD-10-CM

## 2012-12-19 DIAGNOSIS — Z86711 Personal history of pulmonary embolism: Secondary | ICD-10-CM | POA: Insufficient documentation

## 2012-12-19 DIAGNOSIS — I4891 Unspecified atrial fibrillation: Secondary | ICD-10-CM

## 2012-12-19 DIAGNOSIS — I472 Ventricular tachycardia: Secondary | ICD-10-CM

## 2012-12-19 DIAGNOSIS — Z7901 Long term (current) use of anticoagulants: Secondary | ICD-10-CM

## 2012-12-19 DIAGNOSIS — I255 Ischemic cardiomyopathy: Secondary | ICD-10-CM | POA: Insufficient documentation

## 2012-12-19 DIAGNOSIS — I2589 Other forms of chronic ischemic heart disease: Secondary | ICD-10-CM

## 2012-12-19 DIAGNOSIS — I2510 Atherosclerotic heart disease of native coronary artery without angina pectoris: Secondary | ICD-10-CM

## 2012-12-19 LAB — POCT INR: INR: 1.5

## 2012-12-19 MED ORDER — CARVEDILOL 6.25 MG PO TABS: 6.2500 mg | ORAL_TABLET | Freq: Two times a day (BID) | ORAL | Status: DC

## 2012-12-19 MED ORDER — WARFARIN SODIUM 4 MG PO TABS: ORAL_TABLET | ORAL | Status: DC

## 2012-12-19 NOTE — Assessment & Plan Note (Addendum)
Anticoagulation resumed after Holter showed "AF" in March 2014

## 2012-12-19 NOTE — Assessment & Plan Note (Signed)
controlled 

## 2012-12-19 NOTE — Assessment & Plan Note (Signed)
After TKR March 2011, Coumadin stopped Sept 2011

## 2012-12-19 NOTE — Progress Notes (Signed)
12/19/2012 [name]   02-Aug-1940  [id number]  Primary Physicia No primary provider on file. Primary Cardiologist: Dr Allyson Sabal  HPI:  Complicated pt with three chart volumes followed by Dr Allyson Sabal. He has history of CAD status post stenting of his LAD, circumflex and marginal branch as well as his RCA in the past. His most recent cath performed by Dr Allyson Sabal July 02, 2009, was remarkable for patent stents with an 80% in-stent restenosis within the obtuse marginal branch stent, as well as a 60% ostial RCA stenosis which I stented using a Taxus Liberte drug-eluting stent. His other problems include obstructive sleep apnea, hypertension and hyperlipidemia. He does have a history of paroxysmal A-fib in the past as well as pulmonary embolism after a total knee replacement in 2011.  At that time he was placed on Coumadin anticoagulation, this was later stopped in Sept 2011. He has Barrett esophagus and gets periodic endoscopies and biopsies and because of this we have stopped his Coumadin     He had a Myoview stress test performed Feb 2014 that was low risk and showed inferolateral scar. He had a 2D echo then as well that revealed an EF of 45% to 50% similar to a prior echo. Carotid Dopplers were unremarkable. He did have an event monitor in Jan 2014 that showed evidence of PAF as well as nonsustained ventricular tachycardia with a 26-beat run of monomorphic VT during which he was asymptomatic. Because of this, Dr Allyson Sabal elected to decrease his carvedilol and start him on Betapace as well as Coumadin anticoagulation. He was referred to Dr Ladona Ridgel in April who actually did not think [name] was indicated. He has done well from a cardiac standpoint, no palpitations or chest pain.    Current Outpatient Prescriptions  Medication Sig Dispense Refill  . aspirin 81 MG tablet Take 81 mg by mouth daily.      [name] atorvastatin (LIPITOR) 80 MG tablet Take 80 mg by mouth daily.      . calcium-vitamin D (OSCAL WITH D) 500-200  MG-UNIT per tablet Take 1 tablet by mouth daily.      [name] lisinopril (PRINIVIL,ZESTRIL) 20 MG tablet Take 10 mg by mouth daily.      . Multiple Vitamins-Minerals (MULTIVITAMIN WITH MINERALS) tablet Take 1 tablet by mouth daily.      [name] omeprazole (PRILOSEC) 20 MG capsule Take 20 mg by mouth 4 (four) times daily.      . vitamin B-12 (CYANOCOBALAMIN) 1000 MCG tablet Take 1,000 mcg by mouth daily.      . carvedilol (COREG) 6.25 MG tablet Take 1 tablet (6.25 mg total) by mouth 2 (two) times daily.  180 tablet  3  . warfarin (COUMADIN) 4 MG tablet Take 1.5-2 tablets daily as directed by coumadin clinic  50 tablet  5   No current facility-administered medications for this visit.    No Known Allergies  History   Social History  . Marital Status: Widowed    Spouse Name: N/A    Number of Children: N/A  . Years of Education: N/A   Occupational History  . Not on file.   Social History Main Topics  . Smoking status: Former Smoker    Quit date: 12/20/1987  . Smokeless tobacco: Not on file  . Alcohol Use: Yes  . Drug Use: No  . Sexually Active: Not on file   Other Topics Concern  . Not on file   Social History Narrative  . No narrative on file  Review of Systems: General: negative for chills, fever, night sweats or weight changes.  Cardiovascular: negative for chest pain, dyspnea on exertion, edema, orthopnea, palpitations, paroxysmal nocturnal dyspnea or shortness of breath Dermatological: negative for rash Respiratory: negative for cough or wheezing Urologic: negative for hematuria Abdominal: negative for nausea, vomiting, diarrhea, bright red blood per rectum, melena, or hematemesis Neurologic: negative for visual changes, syncope, or dizziness All other systems reviewed and are otherwise negative except as noted above.    Blood pressure 130/70, pulse 66, height 5\' 11"  (1.803 m), weight 186 lb 1.6 oz (84.414 kg).  General appearance: alert, cooperative and no distress Lungs:  clear to auscultation bilaterally Heart: regular rate and rhythm Extremities: extremities normal, atraumatic, no cyanosis or edema and S/P Lt TKR  EKG  EKG: normal EKG, normal sinus rhythm, unchanged from previous tracings, prolonged QT interval- 486  ASSESSMENT AND PLAN:   PSVT (paroxysmal supraventricular tachycardia) Noted on Holter Jan 2014  Atherosclerotic heart disease of native coronary artery without angina pectoris Multiple PCIs, last PCI was an RCA DES 12/08. Myoview low risk Feb 2014. No angina complaints  Long term (current) use of anticoagulants Anticoagulation resumed after Holter showed "AF" in March 2014  Cardiomyopathy, ischemic EF 39% by echo Feb 2014  HTN (hypertension) controlled  Hx pulmonary embolism After TKR March 2011, Coumadin stopped Sept 2011  NSVT (nonsustained ventricular tachycardia) See Dr Lubertha Basque note from April 2014- No indication for Betapace   PLAN  Per Dr Lubertha Basque recommendations I stopped [name] Betapace. I instructed him to wait 7 days then increase his Coreg to 6.25mg  BID. He will follow up with Dr Allyson Sabal in 3 months.   [facility] 12/19/2012 4:36 PM

## 2012-12-19 NOTE — Assessment & Plan Note (Addendum)
Noted on Holter Jan 2014

## 2012-12-19 NOTE — Assessment & Plan Note (Signed)
See Dr Lubertha Basque note from April 2014- No indication for Betapace

## 2012-12-19 NOTE — Assessment & Plan Note (Signed)
Multiple PCIs, last PCI was an RCA DES 12/08. Myoview low risk Feb 2014. No angina complaints

## 2012-12-19 NOTE — Patient Instructions (Addendum)
Stop [name]. Increase Coreg to 6.25mg  twice a day in one week. Call if your heart rate is less than 55 on the increased Coreg. See Dr Allyson Sabal in 3 months.

## 2012-12-19 NOTE — Assessment & Plan Note (Signed)
EF 39% by echo Feb 2014

## 2012-12-23 ENCOUNTER — Telehealth: Payer: Self-pay | Admitting: Cardiovascular Disease

## 2012-12-23 NOTE — Telephone Encounter (Signed)
Would like the last office note faxed over to the [facility] hospital. Was seen in the office by [facility] on 12/19/12.Marland KitchenPlease fax to [phone] ATTN Janeal Holmes ...  Thanks

## 2013-03-26 ENCOUNTER — Ambulatory Visit (INDEPENDENT_AMBULATORY_CARE_PROVIDER_SITE_OTHER): Payer: Medicare Other | Admitting: Cardiovascular Disease

## 2013-03-26 ENCOUNTER — Encounter: Payer: Self-pay | Admitting: Cardiovascular Disease

## 2013-03-26 VITALS — BP 104/72 | HR 72 | Ht 71.0 in | Wt 181.9 lb

## 2013-03-26 DIAGNOSIS — I255 Ischemic cardiomyopathy: Secondary | ICD-10-CM

## 2013-03-26 DIAGNOSIS — G4733 Obstructive sleep apnea (adult) (pediatric): Secondary | ICD-10-CM

## 2013-03-26 DIAGNOSIS — I480 Paroxysmal atrial fibrillation: Secondary | ICD-10-CM

## 2013-03-26 DIAGNOSIS — I482 Chronic atrial fibrillation, unspecified: Secondary | ICD-10-CM | POA: Insufficient documentation

## 2013-03-26 DIAGNOSIS — I10 Essential (primary) hypertension: Secondary | ICD-10-CM

## 2013-03-26 DIAGNOSIS — I2589 Other forms of chronic ischemic heart disease: Secondary | ICD-10-CM

## 2013-03-26 DIAGNOSIS — Z79899 Other long term (current) drug therapy: Secondary | ICD-10-CM

## 2013-03-26 DIAGNOSIS — E785 Hyperlipidemia, unspecified: Secondary | ICD-10-CM

## 2013-03-26 DIAGNOSIS — I4891 Unspecified atrial fibrillation: Secondary | ICD-10-CM

## 2013-03-26 DIAGNOSIS — K2270 Barrett's esophagus without dysplasia: Secondary | ICD-10-CM

## 2013-03-26 NOTE — Assessment & Plan Note (Signed)
On statin therapy. We will recheck a lipid and liver profile 

## 2013-03-26 NOTE — Assessment & Plan Note (Signed)
Well-controlled on current medications 

## 2013-03-26 NOTE — Patient Instructions (Signed)
Your physician wants you to follow-up in: 6 months with Corine Shelter [facility] and 1 year with Dr Allyson Sabal.   You will receive a reminder letter in the mail two months in advance. If you don't receive a letter, please call our office to schedule the follow-up appointment.   Have blood work in 1-2 weeks, fasting

## 2013-03-26 NOTE — Assessment & Plan Note (Signed)
History of stenting of his LAD, circumflex and obtuse marginal branch as well as to the RCA in the past his last cath was in 07/03/07 which time I stented the origin of the dominant RCA. He did have "in-stent restenosis" within his obtuse marginal branch stent. EF was normal at that time. His most recent Myoview was performed 09/03/12 and was nonischemic. He denies chest pain or shortness of breath.

## 2013-03-26 NOTE — Assessment & Plan Note (Signed)
On the vent monitoring. He has apraxia. He has had episodes of nonsustained ventricular tachycardia which have been asymptomatic. After consultation with EP was decided to stop his Betapace and increase his carvedilol

## 2013-03-26 NOTE — Progress Notes (Signed)
03/26/2013 [name]   [birth year]  [id number]  Primary Physician No primary provider on file. Primary Cardiologist: Runell Gess MD Roseanne Reno   HPI:  The patient is a 72 year old, thin and fit-appearing Caucasian male who I last saw in the office 3 months ago. He is a father of 2, grandfather to 2 grandchildren. He has history of CAD status post stenting of his LAD, circumflex and marginal branch as well as his RCA in the past. His most recent cath performed by myself July 02, 2009, was remarkable for patent stents with an 80% in-stent restenosis within the obtuse marginal branch stent, as well as a 60% ostial RCA stenosis which I stented using a Taxus Liberte drug-eluting stent. His other problems include obstructive sleep apnea, hypertension and hyperlipidemia. 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 He has Barrett esophagus and gets periodic endoscopies and biopsies and because of this I have stopped his Coumadin in the past. When I saw him, he denied chest pain or shortness of breath but was complaining of occasional blurred vision. He had a Myoview stress test performed that was low risk and showed inferolateral scar. He had a 2D echo that revealed an EF of 45% to 50% similar to a prior echo. Carotid Dopplers were unremarkable. He did have an event monitor that showed evidence of PAF as well as nonsustained ventricular tachycardia with a 26-beat run of monomorphic VT during which he was asymptomatic. Because of this, I elected to decrease his carvedilol and start him on Betapace as well as Coumadin anticoagulation. I did discontinue his Lasix as well. I last saw him in the office on 3/514 and was seen by Corine Shelter [facility] on 12/19/12. His relatively asymptomatic.    Current Outpatient Prescriptions  Medication Sig Dispense Refill  . aspirin 81  MG tablet Take 81 mg by mouth daily.      [name] atorvastatin (LIPITOR) 80 MG tablet Take 80 mg by mouth daily.      . calcium-vitamin D (OSCAL WITH D) 500-200 MG-UNIT per tablet Take 1 tablet by mouth daily.      . carvedilol (COREG) 6.25 MG tablet Take 1 tablet (6.25 mg total) by mouth 2 (two) times daily.  180 tablet  3  . Dabigatran Etexilate Mesylate (PRADAXA PO) Take 1 tablet by mouth 2 (two) times daily.      [name] lisinopril (PRINIVIL,ZESTRIL) 20 MG tablet Take 10 mg by mouth daily.      . Multiple Vitamins-Minerals (MULTIVITAMIN WITH MINERALS) tablet Take 1 tablet by mouth daily.      [name] omeprazole (PRILOSEC) 20 MG capsule Take 20 mg by mouth 4 (four) times daily.      [name] testosterone (ANDROGEL) 50 MG/5GM GEL Place 5 g onto the skin daily.      . vitamin B-12 (CYANOCOBALAMIN) 1000 MCG tablet Take 1,000 mcg by mouth daily.       No current facility-administered medications for this visit.    No Active Allergies  History   Social History  . Marital Status: Widowed    Spouse Name: N/A    Number of Children: N/A  . Years of Education: N/A   Occupational History  . Not on file.   Social History Main Topics  . Smoking status: Former Smoker    Quit date: 12/20/1987  . Smokeless tobacco: Never Used  .  Alcohol Use: Yes     Comment: 4 beers per day  . Drug Use: No  . Sexual Activity: Not on file   Other Topics Concern  . Not on file   Social History Narrative  . No narrative on file     Review of Systems: General: negative for chills, fever, night sweats or weight changes.  Cardiovascular: negative for chest pain, dyspnea on exertion, edema, orthopnea, palpitations, paroxysmal nocturnal dyspnea or shortness of breath Dermatological: negative for rash Respiratory: negative for cough or wheezing Urologic: negative for hematuria Abdominal: negative for nausea, vomiting, diarrhea, bright red blood per rectum, melena, or hematemesis Neurologic: negative for visual changes, syncope,  or dizziness All other systems reviewed and are otherwise negative except as noted above.    Blood pressure 104/72, pulse 72, height 5\' 11"  (1.803 m), weight 181 lb 14.4 oz (82.509 kg).  General appearance: alert and no distress Neck: no adenopathy, no carotid bruit, no JVD, supple, symmetrical, trachea midline and thyroid not enlarged, symmetric, no tenderness/mass/nodules Lungs: clear to auscultation bilaterally Heart: regular rate and rhythm, S1, S2 normal, no murmur, click, rub or gallop Extremities: extremities normal, atraumatic, no cyanosis or edema  EKG normal sinus rhythm at 64 with PVCs  ASSESSMENT AND PLAN:   Cardiomyopathy, ischemic History of stenting of his LAD, circumflex and obtuse marginal branch as well as to the RCA in the past his last cath was in 07/03/07 which time I stented the origin of the dominant RCA. He did have "in-stent restenosis" within his obtuse marginal branch stent. EF was normal at that time. His most recent Myoview was performed 09/03/12 and was nonischemic. He denies chest pain or shortness of breath.  Hyperlipidemia On statin therapy. We will recheck a lipid and liver profile  HTN (hypertension) Well-controlled on current medications  Paroxysmal atrial fibrillation On the vent monitoring. He has apraxia. He has had episodes of nonsustained ventricular tachycardia which have been asymptomatic. After consultation with EP was decided to stop his Betapace and increase his carvedilol      Runell Gess MD [facility], [facility] 03/26/2013 2:38 PM

## 2013-03-28 LAB — HEPATIC FUNCTION PANEL
AST: 19 IU/L (ref 0–40)
Albumin: 4.2 g/dL (ref 3.5–4.8)
Alkaline Phosphatase: 59 IU/L (ref 39–117)
Bilirubin, Direct: 0.21 mg/dL (ref 0.00–0.40)
Total Bilirubin: 0.7 mg/dL (ref 0.0–1.2)
Total Protein: 5.8 g/dL — ABNORMAL LOW (ref 6.0–8.5)

## 2013-03-28 LAB — LIPID PANEL: Cholesterol, Total: 141 mg/dL (ref 100–199)

## 2013-03-30 ENCOUNTER — Ambulatory Visit: Payer: Self-pay | Admitting: Pharmacist Clinician (PhC)/ Clinical Pharmacy Specialist

## 2013-03-30 DIAGNOSIS — Z7901 Long term (current) use of anticoagulants: Secondary | ICD-10-CM

## 2013-03-30 DIAGNOSIS — I471 Supraventricular tachycardia: Secondary | ICD-10-CM

## 2013-04-13 ENCOUNTER — Encounter: Payer: Self-pay | Admitting: *Deleted

## 2013-08-13 ENCOUNTER — Encounter: Payer: Self-pay | Admitting: Cardiology

## 2013-08-13 ENCOUNTER — Ambulatory Visit (INDEPENDENT_AMBULATORY_CARE_PROVIDER_SITE_OTHER): Payer: Medicare Other | Admitting: Cardiology

## 2013-08-13 VITALS — BP 180/80 | HR 67 | Ht 71.0 in | Wt 184.0 lb

## 2013-08-13 DIAGNOSIS — I4891 Unspecified atrial fibrillation: Secondary | ICD-10-CM

## 2013-08-13 DIAGNOSIS — I472 Ventricular tachycardia: Secondary | ICD-10-CM

## 2013-08-13 DIAGNOSIS — I4729 Other ventricular tachycardia: Secondary | ICD-10-CM

## 2013-08-13 DIAGNOSIS — I2589 Other forms of chronic ischemic heart disease: Secondary | ICD-10-CM

## 2013-08-13 DIAGNOSIS — I2510 Atherosclerotic heart disease of native coronary artery without angina pectoris: Secondary | ICD-10-CM

## 2013-08-13 DIAGNOSIS — Z7901 Long term (current) use of anticoagulants: Secondary | ICD-10-CM

## 2013-08-13 DIAGNOSIS — I255 Ischemic cardiomyopathy: Secondary | ICD-10-CM

## 2013-08-13 DIAGNOSIS — R002 Palpitations: Secondary | ICD-10-CM

## 2013-08-13 DIAGNOSIS — I10 Essential (primary) hypertension: Secondary | ICD-10-CM

## 2013-08-13 DIAGNOSIS — Z86711 Personal history of pulmonary embolism: Secondary | ICD-10-CM

## 2013-08-13 DIAGNOSIS — K2270 Barrett's esophagus without dysplasia: Secondary | ICD-10-CM

## 2013-08-13 DIAGNOSIS — I480 Paroxysmal atrial fibrillation: Secondary | ICD-10-CM

## 2013-08-13 DIAGNOSIS — I471 Supraventricular tachycardia: Secondary | ICD-10-CM

## 2013-08-13 NOTE — Assessment & Plan Note (Signed)
2011 after Lt TKR

## 2013-08-13 NOTE — Patient Instructions (Signed)
Your physician has recommended that you wear an event monitor. Event monitors are medical devices that record the heart's electrical activity. Doctors most often [place] these monitors to diagnose arrhythmias. Arrhythmias are problems with the speed or rhythm of the heartbeat. The monitor is a small, portable device. You can wear one while you do your normal daily activities. This is usually used to diagnose what is causing palpitations/syncope (passing out). You will wear this for 30 days.

## 2013-08-13 NOTE — Assessment & Plan Note (Signed)
On event monitor 

## 2013-08-13 NOTE — Assessment & Plan Note (Signed)
2011

## 2013-08-13 NOTE — Assessment & Plan Note (Signed)
On event monitor

## 2013-08-13 NOTE — Assessment & Plan Note (Signed)
No angina 

## 2013-08-13 NOTE — Assessment & Plan Note (Signed)
EF 45%

## 2013-08-13 NOTE — Assessment & Plan Note (Signed)
Pradaxa 

## 2013-08-13 NOTE — Assessment & Plan Note (Signed)
Pt complains of tachycardia when exercising, HR up to 180. Intermittent episodes

## 2013-08-13 NOTE — Assessment & Plan Note (Signed)
Repeat BP 148/80

## 2013-08-13 NOTE — Progress Notes (Signed)
08/13/2013 [name]   1940-09-21  [id number]  Primary Physicia No PCP Per Patient Primary Cardiologist: Dr Gwenlyn Found  HPI:  Complicated pt with three chart volumes followed now by Dr Gwenlyn Found. He has history of CAD status post stenting of his LAD, circumflex and marginal branch as well as his RCA in the past. His most recent cath performed by Dr Gwenlyn Found July 02, 2009, was remarkable for patent stents with an 80% in-stent restenosis within the obtuse marginal branch stent, as well as a 60% ostial RCA stenosis which Dr Gwenlyn Found stented using a Taxus stent. His other problems include obstructive sleep apnea, hypertension and hyperlipidemia. He does have a history of paroxysmal A-fib in the past as well as pulmonary embolism after a total knee replacement in 2011. At that time he was placed on Coumadin anticoagulation, this was later stopped in Sept 2011. He has Barrett esophagus and gets periodic endoscopies and biopsies and because of this we have stopped his Coumadin  He had a Myoview stress test performed Feb 2014 that was low risk and showed inferolateral scar. He had a 2D echo then as well that revealed an EF of 45% to 50% similar to a prior echo. Carotid Dopplers were unremarkable. He did have an event monitor in Jan 2014 that showed evidence of PAF as well as NSVT with a 26-beat run of monomorphic VT during which he was asymptomatic. Because of this, Dr Gwenlyn Found elected to decrease his carvedilol and start him on Betapace as well as Coumadin anticoagulation. He was referred to Dr Lovena Le in April who actually did not think [name] was indicated and his [name] was stopped and his Coreg increased to 6.25 mg BID. He has done well until recently when he noted tachycardia and dizziness while exercising at the gym. This doesn't happen every time. He says his HR gets up to 180 which is not normal for him. He denies chest pain or syncope.     Current Outpatient Prescriptions  Medication Sig Dispense Refill  .  aspirin 81 MG tablet Take 81 mg by mouth daily.      [name] atorvastatin (LIPITOR) 80 MG tablet Take 80 mg by mouth daily.      . calcium-vitamin D (OSCAL WITH D) 500-200 MG-UNIT per tablet Take 1 tablet by mouth daily.      . carvedilol (COREG) 6.25 MG tablet Take 1 tablet (6.25 mg total) by mouth 2 (two) times daily.  180 tablet  3  . dabigatran (PRADAXA) 150 MG CAPS capsule Take 150 mg by mouth every 12 (twelve) hours.      [name] lisinopril (PRINIVIL,ZESTRIL) 20 MG tablet Take 10 mg by mouth daily.      . Multiple Vitamins-Minerals (MULTIVITAMIN WITH MINERALS) tablet Take 1 tablet by mouth daily.      [name] omeprazole (PRILOSEC) 20 MG capsule Take 20 mg by mouth 4 (four) times daily.      [name] testosterone (ANDROGEL) 50 MG/5GM GEL Place 5 g onto the skin daily.      . vitamin B-12 (CYANOCOBALAMIN) 1000 MCG tablet Take 1,000 mcg by mouth daily.       No current facility-administered medications for this visit.    No Active Allergies  History   Social History  . Marital Status: Widowed    Spouse Name: N/A    Number of Children: N/A  . Years of Education: N/A   Occupational History  . Not on file.   Social History Main Topics  . Smoking status: Former Smoker  Quit date: 12/20/1987  . Smokeless tobacco: Never Used  . Alcohol Use: Yes     Comment: 4 beers per day  . Drug Use: No  . Sexual Activity: Not on file   Other Topics Concern  . Not on file   Social History Narrative  . No narrative on file     Review of Systems: General: negative for chills, fever, night sweats or weight changes.  Cardiovascular: negative for chest pain, dyspnea on exertion, edema, orthopnea, palpitations, paroxysmal nocturnal dyspnea or shortness of breath Dermatological: negative for rash Respiratory: negative for cough or wheezing Urologic: negative for hematuria Abdominal: negative for nausea, vomiting, diarrhea, bright red blood per rectum, melena, or hematemesis Neurologic: negative for visual changes,  syncope, or dizziness All other systems reviewed and are otherwise negative except as noted above.    Blood pressure 180/80, pulse 67, height 5\' 11"  (1.803 m), weight 184 lb (83.462 kg).  General appearance: alert, cooperative and no distress Neck: no carotid bruit and no JVD Lungs: clear to auscultation bilaterally Heart: regular rate and rhythm  EKG NSR, PACs, SB, multifocal PVCs.  ASSESSMENT AND PLAN:   Palpitations Pt complains of tachycardia when exercising, HR up to 180. Intermittent episodes  Paroxysmal atrial fibrillation 2011 after Lt TKR  PSVT (paroxysmal supraventricular tachycardia) On event monitor  NSVT (nonsustained ventricular tachycardia) On event monitor  Long term (current) use of anticoagulants Pradaxa  Hx pulmonary embolism 2011  HTN (hypertension) Repeat B/P 148/80  Cardiomyopathy, ischemic EF 45%  CAD (coronary artery disease)- last PCI 12/10 No angina  Barrett's esophagus .   PLAN  I am going to order an event monitor. Depending on results he'll probably need to be referred back to Dr Lovena Le. I am hesitant to increase his beta blocker as it appears his baseline rhythm is sinus bradycardia.   Calinda Stockinger KPA-C 08/13/2013 9:22 AM

## 2013-08-14 ENCOUNTER — Telehealth (HOSPITAL_COMMUNITY): Payer: Self-pay | Admitting: *Deleted

## 2013-08-18 ENCOUNTER — Other Ambulatory Visit (HOSPITAL_COMMUNITY): Payer: Self-pay | Admitting: Cardiovascular Disease

## 2013-08-18 ENCOUNTER — Ambulatory Visit (HOSPITAL_COMMUNITY)
Admission: RE | Admit: 2013-08-18 | Discharge: 2013-08-18 | Disposition: A | Discharge status: Home / Self Care | Payer: Medicare Other | Source: Ambulatory Visit | Attending: Internal Medicine | Admitting: Internal Medicine

## 2013-08-18 DIAGNOSIS — I10 Essential (primary) hypertension: Secondary | ICD-10-CM | POA: Insufficient documentation

## 2013-08-18 DIAGNOSIS — I6529 Occlusion and stenosis of unspecified carotid artery: Secondary | ICD-10-CM

## 2013-08-18 DIAGNOSIS — E785 Hyperlipidemia, unspecified: Secondary | ICD-10-CM | POA: Insufficient documentation

## 2013-08-18 DIAGNOSIS — R6889 Other general symptoms and signs: Secondary | ICD-10-CM | POA: Insufficient documentation

## 2013-08-18 NOTE — Progress Notes (Signed)
Carotid Duplex Completed. Tyrihanna Wingert, BS, RDMS, RVT  

## 2013-08-19 ENCOUNTER — Encounter: Payer: Self-pay | Admitting: *Deleted

## 2013-09-02 ENCOUNTER — Telehealth: Payer: Self-pay | Admitting: *Deleted

## 2013-09-02 NOTE — Telephone Encounter (Signed)
High HR Afib at 188 bpm and asymptomatic at 10:55am. Lasted 75 seconds.  Will fax report.  Message forwarded to L. Kilroy, PA-C.  Will place report on cart once received.

## 2013-09-03 ENCOUNTER — Encounter: Payer: Self-pay | Admitting: Cardiovascular Disease

## 2013-09-03 ENCOUNTER — Other Ambulatory Visit: Payer: Self-pay | Admitting: *Deleted

## 2013-09-03 ENCOUNTER — Telehealth: Payer: Self-pay | Admitting: Cardiovascular Disease

## 2013-09-03 DIAGNOSIS — I480 Paroxysmal atrial fibrillation: Secondary | ICD-10-CM

## 2013-09-03 NOTE — Telephone Encounter (Signed)
Dr Gwenlyn Found reviewed the strips yesterday from [place] that showed AFib.  Dr Gwenlyn Found wants to refer the patient back to Dr Lovena Le.  I spoke with patient and advised him.  He is agreeable with that.  He is asymptomatic and will await phone call with appt.

## 2013-09-03 NOTE — Telephone Encounter (Signed)
Pt called again

## 2013-09-03 NOTE — Telephone Encounter (Signed)
Returning your call. °

## 2013-09-03 NOTE — Telephone Encounter (Signed)
Patient states he has not had any symptoms of a rapid/irregular heart beat.  States he feels fine.  Will defer over to [facility] at her request.

## 2013-09-03 NOTE — Telephone Encounter (Signed)
[place] - any symptoms of rapid/irregular heart beat?  cardionet sent a strip showing a heart rate > 180bpm 09/02/13 - strip given for Tarri Guilliams, PA for review.

## 2013-09-08 ENCOUNTER — Telehealth: Payer: Self-pay | Admitting: Cardiology

## 2013-09-08 NOTE — Telephone Encounter (Signed)
Called patient to inform that he does not need to come see Lurena Joiner on 2/27 and should keep appmt with Dr. Lovena Le on 3/2

## 2013-09-08 NOTE — Telephone Encounter (Signed)
He has an appt with Lurena Joiner on Friday,09-11-13. He also has an appointment with Dr Lovena Le on Monday,-March 2nd Should h e keep the appt with [facility]?

## 2013-09-11 ENCOUNTER — Ambulatory Visit: Payer: Medicare Other | Admitting: Cardiology

## 2013-09-11 ENCOUNTER — Encounter: Payer: Self-pay | Admitting: Internal Medicine

## 2013-09-15 ENCOUNTER — Encounter: Payer: Self-pay | Admitting: Internal Medicine

## 2013-09-15 ENCOUNTER — Ambulatory Visit (INDEPENDENT_AMBULATORY_CARE_PROVIDER_SITE_OTHER): Payer: Medicare Other | Admitting: Internal Medicine

## 2013-09-15 VITALS — BP 136/84 | HR 64 | Ht 71.0 in | Wt 181.0 lb

## 2013-09-15 DIAGNOSIS — I2589 Other forms of chronic ischemic heart disease: Secondary | ICD-10-CM

## 2013-09-15 DIAGNOSIS — I472 Ventricular tachycardia: Secondary | ICD-10-CM

## 2013-09-15 DIAGNOSIS — I6529 Occlusion and stenosis of unspecified carotid artery: Secondary | ICD-10-CM

## 2013-09-15 DIAGNOSIS — I471 Supraventricular tachycardia, unspecified: Secondary | ICD-10-CM

## 2013-09-15 DIAGNOSIS — I255 Ischemic cardiomyopathy: Secondary | ICD-10-CM

## 2013-09-15 DIAGNOSIS — I4729 Other ventricular tachycardia: Secondary | ICD-10-CM

## 2013-09-15 NOTE — Patient Instructions (Signed)
Your physician recommends that you schedule a follow-up appointment as needed  

## 2013-09-15 NOTE — Assessment & Plan Note (Signed)
At this point the patient is almost completely asymptomatic. He does have spells of dizziness, typically not sudden in onset. I think these are more autonomic in nature and not related to any arrhythmias. Will follow and attempt to correlate his symptoms.

## 2013-09-15 NOTE — Progress Notes (Addendum)
HPI [name] returns today for followup. He is a pleasant 73 yo man with an ICM, chronic systolic heart failure class 2A, EF 40%, HTN and NSVT. He has done well in the interim.  His biggest problem involves being romantically involved with three different women. He is exercising almost daily and walks on the treadmill with no limitation. He also uses the elliptical machine and has no chest pain or other symptoms. He has worn a cardiac monitor and i have reviewed it in detail and this demonstrates both atrial fib, NSVT, and sinus bradycardia. No Known Allergies   Current Outpatient Prescriptions  Medication Sig Dispense Refill  . aspirin 81 MG tablet Take 81 mg by mouth daily.      [name] atorvastatin (LIPITOR) 80 MG tablet Take 80 mg by mouth daily.      . calcium-vitamin D (OSCAL WITH D) 500-200 MG-UNIT per tablet Take 2 tablets by mouth daily.       . carvedilol (COREG) 6.25 MG tablet Take 1 tablet (6.25 mg total) by mouth 2 (two) times daily.  180 tablet  3  . dabigatran (PRADAXA) 150 MG CAPS capsule Take 150 mg by mouth every 12 (twelve) hours.      [name] lisinopril (PRINIVIL,ZESTRIL) 20 MG tablet Take 10 mg by mouth daily.      . Multiple Vitamins-Minerals (MULTIVITAMIN WITH MINERALS) tablet Take 1 tablet by mouth daily.      [name] omeprazole (PRILOSEC) 20 MG capsule Take 40 mg by mouth 2 (two) times daily.       [name] testosterone (ANDROGEL) 50 MG/5GM GEL Place 5 g onto the skin daily.      . vitamin B-12 (CYANOCOBALAMIN) 1000 MCG tablet Take 1,000 mcg by mouth daily.       No current facility-administered medications for this visit.     Past Medical History  Diagnosis Date  . Obstructive sleep apnea     Sleep Study in 2011 AHI 3.2. 100% compliance with CPAP.  [name] Hypertension   . Hyperlipidemia   . Pulmonary embolism     On Coumadin  . Chronic anticoagulation     For PE & PAF  . Barrett's esophagus     Gets periodic endoscopies & biopsies. Coumadin has been held in the past for these  precedures.   . Blurred vision   . Meningioma 08/15/12    BrainLAB-guided left frontoparietal craniotomy w/removal of a meningioma.   [name] TIA (transient ischemic attack)   . Cardiomyopathy     Significant ischemic cardiomyopathy. ECHO 08/27/12: EF = 45-50% (Previous Echo revealed EF = 25%)  . Paroxysmal atrial fibrillation     On Coumadin  . PSVT (paroxysmal supraventricular tachycardia)     2D ECHO 08/27/12: EF = 45-50% (Previous Echo revealed EF = 25%)  . Sick sinus syndrome   . Cataract   . Coronary artery disease     Most recent cath by Dr. Gwenlyn Found 07/02/09 was remarkable for patent stents w/an 80% in-stent stenosis within the obtuse marginal branch stent, as well as 60% ostial RCA stenosis which was stented with a Taxus Liberte drug-eluting stent. Nuc stress test 09/03/12 was low risk & showed an inferolateral scar. & an EF of 39%  . CAD (coronary artery disease)     S/P stenting of his LAD, circumflex & marginal branch as well as his RCA in 2008.  [name] Cerebral atherosclerosis     Carotid Doppler 08/27/12 SUMMARY - Bilateral ICAs: Demonstrated normal patency without  evidence of a significant diamenter reduction, tortuosity or any other vascular abnormality. This was a Normal carotid duplex Doppler Evaluation.  . Carotid artery occlusion     Carotid Doppler 08/27/12 SUMMARY - Bilateral ICAs: Demonstrated normal patency without evidence of a significant diamenter reduction, tortuosity or any other vascular abnormality. This was a Normal carotid duplex Doppler Evaluation.  . DVT (deep venous thrombosis)     ROS:   All systems reviewed and negative except as noted in the HPI.   Past Surgical History  Procedure Laterality Date  . Total knee arthroplasty Left 09/2010    Surgery was complicated by PAF with a pulmonary embolism documented by CT angiogram.  . Craniotomy  08/15/12    At [facility]. BrainLAB-guided left frontoparietal craniotomy w/removal of a meningioma.   . Coronary angioplasty with  stent placement  07/02/09    Most recent cath by Dr. Gwenlyn Found 07/02/09 was remarkable for patent stents w/an 80% in-stent stenosis within the obtuse marginal branch stent, as well as 60% ostial RCA stenosis which was stented with a Taxus Liberte drug-eluting stent.   . Cardiac catheterization      Past caths in 2008, 2006, 2005 & 2003     Family History  Problem Relation Age of Onset  . Cancer Mother   . Cardiomyopathy Sister   . Cancer Other     Breast     History   Social History  . Marital Status: Widowed    Spouse Name: N/A    Number of Children: N/A  . Years of Education: N/A   Occupational History  . Not on file.   Social History Main Topics  . Smoking status: Former Smoker    Quit date: 12/20/1987  . Smokeless tobacco: Never Used  . Alcohol Use: Yes     Comment: 4 beers per day  . Drug Use: No  . Sexual Activity: Not on file   Other Topics Concern  . Not on file   Social History Narrative  . No narrative on file     BP 136/84  Pulse 64  Ht 5\' 11"  (1.803 m)  Wt 181 lb (82.101 kg)  BMI 25.26 kg/m2  Physical Exam:  Well appearing 73 yo man, NAD HEENT: Unremarkable Neck:  No JVD, no thyromegally Back:  No CVA tenderness Lungs:  Clear with no wheezes HEART:  Regular rate rhythm, no murmurs, no rubs, no clicks Abd:  soft, positive bowel sounds, no organomegally, no rebound, no guarding Ext:  2 plus pulses, no edema, no cyanosis, no clubbing Skin:  No rashes no nodules Neuro:  CN II through XII intact, motor grossly intact  EKG - NSR with PVC's   Assess/Plan:

## 2013-09-15 NOTE — Assessment & Plan Note (Signed)
He has not had more anginal symptoms. I have asked the patient to continue to exercise but to reduce his stress level. No change in meds.

## 2013-09-17 ENCOUNTER — Ambulatory Visit: Payer: Medicare Other | Admitting: Internal Medicine

## 2013-11-13 ENCOUNTER — Telehealth: Payer: Self-pay | Admitting: Cardiology

## 2013-11-13 NOTE — Telephone Encounter (Signed)
Returned call and pt verified x 2.  Pt stated his BP has been 158/82 HR 49 and 175/88 HR 73 today.  Denied CP, SOB, HA, dizziness or vision changes.  Stated BP has not been running this high.  BP now 177/94 HR 70.  Stated BP has been running 130s-150s over the last 2 weeks.    Pt is asymptomatic and informed a provider will be notified to review chart and advise as Dr. Gwenlyn Found and Lurena Joiner, PA-C.  Pt verbalized understanding and agreed w/ plan.   Lyda Jester, PA-C notified and reviewed chart.  Advised pt increase lisinopril to 20 mg daily over the weekend and come in for BP check next week (Thurs/Friday).  Call back to pt and left message on pt-identified VM w/ instructions to increase lisinopril to 20 mg daily and call on Monday to schedule BP check appt.

## 2013-11-13 NOTE — Telephone Encounter (Signed)
BP is jumping around,it is now 181/100 and heart rate is 43.

## 2014-03-26 ENCOUNTER — Ambulatory Visit (INDEPENDENT_AMBULATORY_CARE_PROVIDER_SITE_OTHER): Payer: Medicare Other | Admitting: Cardiovascular Disease

## 2014-03-26 ENCOUNTER — Encounter: Payer: Self-pay | Admitting: Cardiovascular Disease

## 2014-03-26 VITALS — BP 142/74 | HR 64 | Ht 71.0 in | Wt 181.1 lb

## 2014-03-26 DIAGNOSIS — I480 Paroxysmal atrial fibrillation: Secondary | ICD-10-CM

## 2014-03-26 DIAGNOSIS — I6529 Occlusion and stenosis of unspecified carotid artery: Secondary | ICD-10-CM

## 2014-03-26 DIAGNOSIS — E782 Mixed hyperlipidemia: Secondary | ICD-10-CM

## 2014-03-26 DIAGNOSIS — I4891 Unspecified atrial fibrillation: Secondary | ICD-10-CM

## 2014-03-26 DIAGNOSIS — I2510 Atherosclerotic heart disease of native coronary artery without angina pectoris: Secondary | ICD-10-CM

## 2014-03-26 DIAGNOSIS — I10 Essential (primary) hypertension: Secondary | ICD-10-CM

## 2014-03-26 DIAGNOSIS — E785 Hyperlipidemia, unspecified: Secondary | ICD-10-CM

## 2014-03-26 DIAGNOSIS — Z79899 Other long term (current) drug therapy: Secondary | ICD-10-CM

## 2014-03-26 NOTE — Assessment & Plan Note (Signed)
History of PAF as well as remote pulmonary embolism on Pradaxa . He is currently in sinus rhythm with PACs

## 2014-03-26 NOTE — Progress Notes (Signed)
03/26/2014 [name]   04-28-1941  [id number]  Primary Physician No PCP Per Patient Primary Cardiologist: Lorretta Harp MD [name]   HPI:  The patient is a 73 year old, thin and fit-appearing Caucasian male who I last saw in the office 49months ago. He is a father of 2, grandfather to 2 grandchildren. He has history of CAD status post stenting of his LAD, circumflex and marginal branch as well as his RCA in the past. His most recent cath performed by myself July 02, 2009, was remarkable for patent stents with an 80% in-stent restenosis within the obtuse marginal branch stent, as well as a 60% ostial RCA stenosis which I stented using a Taxus Liberte drug-eluting stent. His other problems include obstructive sleep apnea, hypertension and hyperlipidemia. 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 He has Barrett esophagus and gets periodic endoscopies and biopsies and because of this I have stopped his Coumadin in the past. When I saw him, he denied chest pain or shortness of breath but was complaining of occasional blurred vision. He had a Myoview stress test performed that was low risk and showed inferolateral scar. He had a 2D echo that revealed an EF of 45% to 50% similar to a prior echo. Carotid Dopplers were unremarkable. He did have an event monitor that showed evidence of PAF as well as nonsustained ventricular tachycardia with a 26-beat run of monomorphic VT during which he was asymptomatic.he is fairly active and works out 6 days a week. He denies chest pain or shortness of breath.   Current Outpatient Prescriptions  Medication Sig Dispense Refill  . aspirin 81 MG tablet Take 81 mg by mouth daily.      [name] atorvastatin (LIPITOR) 80 MG tablet Take 80 mg by mouth daily.      . calcium-vitamin D (OSCAL WITH D) 500-200 MG-UNIT per tablet  Take 2 tablets by mouth daily.       . carvedilol (COREG) 6.25 MG tablet Take 1 tablet (6.25 mg total) by mouth 2 (two) times daily.  180 tablet  3  . dabigatran (PRADAXA) 150 MG CAPS capsule Take 150 mg by mouth every 12 (twelve) hours.      [name] lisinopril (PRINIVIL,ZESTRIL) 20 MG tablet Take 10 mg by mouth daily.      . Multiple Vitamins-Minerals (MULTIVITAMIN WITH MINERALS) tablet Take 1 tablet by mouth daily.      [name] omeprazole (PRILOSEC) 20 MG capsule Take 40 mg by mouth 2 (two) times daily.       [name] testosterone (ANDROGEL) 50 MG/5GM GEL Place 5 g onto the skin daily.      . vitamin B-12 (CYANOCOBALAMIN) 1000 MCG tablet Take 1,000 mcg by mouth daily.       No current facility-administered medications for this visit.    No Known Allergies  History   Social History  . Marital Status: Widowed    Spouse Name: N/A    Number of Children: N/A  . Years of Education: N/A   Occupational History  . Not on file.   Social History Main Topics  . Smoking status: Former Smoker    Quit date: 12/20/1987  . Smokeless tobacco: Never Used  . Alcohol Use: Yes     Comment: 4 beers per day  . Drug Use: No  . Sexual Activity: Not on file  Other Topics Concern  . Not on file   Social History Narrative  . No narrative on file     Review of Systems: General: negative for chills, fever, night sweats or weight changes.  Cardiovascular: negative for chest pain, dyspnea on exertion, edema, orthopnea, palpitations, paroxysmal nocturnal dyspnea or shortness of breath Dermatological: negative for rash Respiratory: negative for cough or wheezing Urologic: negative for hematuria Abdominal: negative for nausea, vomiting, diarrhea, bright red blood per rectum, melena, or hematemesis Neurologic: negative for visual changes, syncope, or dizziness All other systems reviewed and are otherwise negative except as noted above.    Blood pressure 142/74, pulse 64, height 5\' 11"  (1.803 m), weight 181 lb 1.6 oz  (82.146 kg).  General appearance: alert and no distress Neck: no adenopathy, no carotid bruit, no JVD, supple, symmetrical, trachea midline and thyroid not enlarged, symmetric, no tenderness/mass/nodules Lungs: clear to auscultation bilaterally Heart: regular rate and rhythm, S1, S2 normal, no murmur, click, rub or gallop Extremities: extremities normal, atraumatic, no cyanosis or edema  EKG normal sinus rhythm at 64 with PACs and occasional PVCs  ASSESSMENT AND PLAN:   CAD (coronary artery disease)- last PCI 12/10 History of CAD status post LAD, circumflex and marginal stenting as well as RCA stenting in the past. His last catheterization performed 07/02/09 by myself was remarkable for patent stents with 80% in-stent restenosis within the obtuse marginal branch the 50% ostial RCA stenosis which I stated using a Taxus Liberte drug-eluting stent. He has remained asymptomatic from a heart point of view with a low-risk Myoview performed 07/25/09. His EF in the 45-50% range. He denies chest pain or shortness of breath.  HTN (hypertension) Well-controlled on current medications  Paroxysmal atrial fibrillation History of PAF as well as remote pulmonary embolism on Pradaxa . He is currently in sinus rhythm with PACs  Hyperlipidemia On statin therapy. We will recheck a lipid and liver profile      Lorretta Harp MD [facility], [facility] 03/26/2014 9:11 AM

## 2014-03-26 NOTE — Assessment & Plan Note (Signed)
History of CAD status post LAD, circumflex and marginal stenting as well as RCA stenting in the past. His last catheterization performed 07/02/09 by myself was remarkable for patent stents with 80% in-stent restenosis within the obtuse marginal branch the 50% ostial RCA stenosis which I stated using a Taxus Liberte drug-eluting stent. He has remained asymptomatic from a heart point of view with a low-risk Myoview performed 07/25/09. His EF in the 45-50% range. He denies chest pain or shortness of breath.

## 2014-03-26 NOTE — Assessment & Plan Note (Signed)
Well-controlled on current medications 

## 2014-03-26 NOTE — Assessment & Plan Note (Signed)
On statin therapy. We will recheck a lipid and liver profile 

## 2014-03-26 NOTE — Patient Instructions (Signed)
  We will see you back in follow up in 1 year with Dr Berry.   Dr Berry has ordered: Your physician recommends that you return for a FASTING lipid profile    

## 2014-04-02 LAB — LIPID PANEL
CHOLESTEROL TOTAL: 154 mg/dL (ref 100–199)
Chol/HDL Ratio: 2.2 ratio units (ref 0.0–5.0)
HDL: 69 mg/dL (ref >39)
LDL Calculated: 71 mg/dL (ref 0–99)
Triglycerides: 70 mg/dL (ref 0–149)
VLDL Cholesterol Cal: 14 mg/dL (ref 5–40)

## 2014-04-02 LAB — HEPATIC FUNCTION PANEL
ALK PHOS: 64 IU/L (ref 39–117)
ALT: 17 IU/L (ref 0–44)
AST: 18 IU/L (ref 0–40)
Albumin: 4.2 g/dL (ref 3.5–4.8)
BILIRUBIN DIRECT: 0.2 mg/dL (ref 0.00–0.40)
TOTAL PROTEIN: 6.5 g/dL (ref 6.0–8.5)
Total Bilirubin: 0.8 mg/dL (ref 0.0–1.2)

## 2014-04-05 ENCOUNTER — Encounter: Payer: Self-pay | Admitting: *Deleted

## 2014-09-08 ENCOUNTER — Telehealth: Payer: Self-pay | Admitting: *Deleted

## 2014-09-08 NOTE — Telephone Encounter (Signed)
Dr. Tiffany Kocher needs permission and instructions for holding the patient's Pradaxa. He is scheduled for a colonoscopy/EGD, under monitored anesthesia on Monday, 09/13/14.

## 2014-09-13 ENCOUNTER — Ambulatory Visit: Payer: Self-pay | Admitting: Unknown Physician Specialty

## 2014-09-13 NOTE — Telephone Encounter (Signed)
Okay to hold Pradaxa 2 days prior to his procedure.restart afterwards

## 2014-09-13 NOTE — Telephone Encounter (Signed)
Faxed clearance to Dr. Tiffany Kocher

## 2014-11-08 LAB — SURGICAL PATHOLOGY

## 2014-11-11 ENCOUNTER — Telehealth: Payer: Self-pay | Admitting: *Deleted

## 2014-11-11 NOTE — Telephone Encounter (Signed)
Dr Hillery Aldo Neila Gear is requesting surgical clearance for a right inguinal hernia repair on Mr Bergquist. I will defer to Dr Gwenlyn Found.

## 2014-11-11 NOTE — Telephone Encounter (Signed)
His last Myoview was 2 years ago and he hasn't been seen since September. He probably should be seen in the office first prior to clearance

## 2014-11-15 ENCOUNTER — Encounter
Admission: RE | Admit: 2014-11-15 | Discharge: 2014-11-15 | Disposition: A | Discharge status: Home / Self Care | Payer: Medicare Other | Source: Ambulatory Visit | Attending: Endocrinology | Admitting: Endocrinology

## 2014-11-15 DIAGNOSIS — Z01812 Encounter for preprocedural laboratory examination: Secondary | ICD-10-CM | POA: Diagnosis not present

## 2014-11-15 DIAGNOSIS — K4090 Unilateral inguinal hernia, without obstruction or gangrene, not specified as recurrent: Secondary | ICD-10-CM | POA: Diagnosis not present

## 2014-11-15 HISTORY — DX: Heart failure, unspecified: I50.9

## 2014-11-15 HISTORY — DX: Reserved for inherently not codable concepts without codable children: IMO0001

## 2014-11-15 HISTORY — DX: Personal history of other diseases of the circulatory system: Z86.79

## 2014-11-15 HISTORY — DX: Unspecified osteoarthritis, unspecified site: M19.90

## 2014-11-15 HISTORY — DX: Unspecified atrial fibrillation: I48.91

## 2014-11-15 HISTORY — DX: Gastro-esophageal reflux disease without esophagitis: K21.9

## 2014-11-15 LAB — CBC WITH DIFFERENTIAL/PLATELET
BASOS ABS: 0 10*3/uL (ref 0–0.1)
Eosinophils Absolute: 0 10*3/uL (ref 0–0.7)
HEMATOCRIT: 40.6 % (ref 40.0–52.0)
Hemoglobin: 13.3 g/dL (ref 13.0–18.0)
LYMPHS ABS: 1.1 10*3/uL (ref 1.0–3.6)
MCH: 33.3 pg (ref 26.0–34.0)
MCHC: 32.8 g/dL (ref 32.0–36.0)
MCV: 101.3 fL — ABNORMAL HIGH (ref 80.0–100.0)
MONO ABS: 0.5 10*3/uL (ref 0.2–1.0)
Monocytes Relative: 12 %
Neutro Abs: 2.6 10*3/uL (ref 1.4–6.5)
Neutrophils Relative %: 61 %
Platelets: 140 10*3/uL — ABNORMAL LOW (ref 150–440)
RBC: 4.01 MIL/uL — AB (ref 4.40–5.90)
RDW: 14.7 % — ABNORMAL HIGH (ref 11.5–14.5)
WBC: 4.3 10*3/uL (ref 3.8–10.6)

## 2014-11-15 LAB — BASIC METABOLIC PANEL
ANION GAP: 7 (ref 5–15)
BUN: 16 mg/dL (ref 6–20)
CALCIUM: 8.9 mg/dL (ref 8.9–10.3)
CO2: 28 mmol/L (ref 22–32)
Chloride: 104 mmol/L (ref 101–111)
Creatinine, Ser: 0.73 mg/dL (ref 0.61–1.24)
GFR calc non Af Amer: >60 mL/min (ref >60)
Glucose, Bld: 102 mg/dL — ABNORMAL HIGH (ref 65–99)
Potassium: 4.4 mmol/L (ref 3.5–5.1)
Sodium: 139 mmol/L (ref 135–145)

## 2014-11-15 NOTE — Telephone Encounter (Signed)
lmom for patient to contact the office.

## 2014-11-16 ENCOUNTER — Encounter: Payer: Self-pay | Admitting: Cardiology

## 2014-11-16 ENCOUNTER — Ambulatory Visit (INDEPENDENT_AMBULATORY_CARE_PROVIDER_SITE_OTHER): Payer: Medicare Other | Admitting: Cardiology

## 2014-11-16 VITALS — BP 136/80 | HR 75 | Ht 71.0 in | Wt 183.6 lb

## 2014-11-16 DIAGNOSIS — I480 Paroxysmal atrial fibrillation: Secondary | ICD-10-CM | POA: Diagnosis not present

## 2014-11-16 DIAGNOSIS — I10 Essential (primary) hypertension: Secondary | ICD-10-CM

## 2014-11-16 DIAGNOSIS — E785 Hyperlipidemia, unspecified: Secondary | ICD-10-CM

## 2014-11-16 DIAGNOSIS — I255 Ischemic cardiomyopathy: Secondary | ICD-10-CM

## 2014-11-16 DIAGNOSIS — Z01810 Encounter for preprocedural cardiovascular examination: Secondary | ICD-10-CM

## 2014-11-16 DIAGNOSIS — I2583 Coronary atherosclerosis due to lipid rich plaque: Secondary | ICD-10-CM

## 2014-11-16 DIAGNOSIS — I2510 Atherosclerotic heart disease of native coronary artery without angina pectoris: Secondary | ICD-10-CM

## 2014-11-16 NOTE — Telephone Encounter (Signed)
I spoke with patient and made him aware that Dr Gwenlyn Found wanted to see him.  He then told me that the surgery is scheduled for next Tues.  I got him in to see Dr Stanford Breed.

## 2014-11-16 NOTE — Assessment & Plan Note (Signed)
Continue statin. 

## 2014-11-16 NOTE — Patient Instructions (Signed)
Your physician recommends that you schedule a follow-up appointment in: 8-12 [place] 3 DAYS PRIOR TO SURGERY AND RESTART ASAP WHEN OKAY WITH THE SURGEON

## 2014-11-16 NOTE — Assessment & Plan Note (Signed)
Patient has developed recurrent atrial fibrillation compared to previous electrocardiogram. However he is asymptomatic. Would favor rate control and anticoagulation. Continue beta blocker and pradaxa.

## 2014-11-16 NOTE — Assessment & Plan Note (Signed)
Continue ACE inhibitor and beta blocker. 

## 2014-11-16 NOTE — Assessment & Plan Note (Signed)
Patient is scheduled for hernia repair. Recent nuclear study showed no ischemia. He has excellent functional capacity with no chest pain or dyspnea. He may proceed without further cardiac evaluation. Note he is in atrial fibrillation. Would discontinue pradaxa 3 days prior to procedure and resume as soon as possible postoperatively when hemostasis achieved.

## 2014-11-16 NOTE — Progress Notes (Signed)
HPI: 74 year old male followed by Dr. Gwenlyn Found for preoperative evaluation prior to hernia repair. He is status post stenting of his LAD, circumflex and marginal branch as well as his RCA in the past. His most recent cath performed July 02, 2009, was remarkable for an 80% in-stent restenosis within the obtuse marginal branch stent, as well as a 60% ostial RCA stenosis which was stented using a Taxus Liberte drug-eluting stent. Last echocardiogram February 2014 showed an ejection fraction 45-50%. There was mild mitral regurgitation. There was mild to moderate left atrial enlargement and mild tricuspid regurgitation. Nuclear study February 2014 showed an ejection fraction of 39%. There was diaphragmatic attenuation versus prior inferior infarct but no ischemia. 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 Patient exercises routinely. He occasionally has dyspnea but he can walk unlimited amounts and climb 2 flights of stairs with no dyspnea, chest pain. He has not had palpitations, syncope or pedal edema.  Current Outpatient Prescriptions  Medication Sig Dispense Refill  . Acetaminophen (TYLENOL PO) Take 1 tablet by mouth every 6 (six) hours as needed (pain).    [name] aspirin 81 MG tablet Take 81 mg by mouth daily.    [name] atorvastatin (LIPITOR) 80 MG tablet Take 80 mg by mouth daily.    . calcium-vitamin D (OSCAL WITH D) 500-200 MG-UNIT per tablet Take 1 tablet by mouth 2 (two) times daily.     . carvedilol (COREG) 6.25 MG tablet Take 1 tablet (6.25 mg total) by mouth 2 (two) times daily. 180 tablet 3  . dabigatran (PRADAXA) 150 MG CAPS capsule Take 150 mg by mouth every 12 (twelve) hours.    [name] lisinopril (PRINIVIL,ZESTRIL) 20 MG tablet Take 10 mg by mouth daily.    . Multiple Vitamins-Minerals (MULTIVITAMIN WITH MINERALS) tablet Take 1 tablet by mouth daily.    . OMEGA 3-6-9  FATTY ACIDS PO Take 1 capsule by mouth once.    [name] omeprazole (PRILOSEC) 20 MG capsule Take 40 mg by mouth 2 (two) times daily.     [name] testosterone (ANDROGEL) 50 MG/5GM GEL Place 5 g onto the skin daily.    . vitamin B-12 (CYANOCOBALAMIN) 1000 MCG tablet Take 1,000 mcg by mouth daily.     No current facility-administered medications for this visit.     Past Medical History  Diagnosis Date  . Obstructive sleep apnea     Sleep Study in 2011 AHI 3.2. 100% compliance with CPAP.  [name] Hypertension   . Hyperlipidemia   . Pulmonary embolism     On Coumadin  . Chronic anticoagulation     For PE & PAF  . Barrett's esophagus     Gets periodic endoscopies & biopsies. Coumadin has been held in the past for these precedures.   . Blurred vision   . Meningioma 08/15/12    BrainLAB-guided left frontoparietal craniotomy w/removal of a meningioma.   [name] TIA (transient ischemic attack)   . Cardiomyopathy     Significant ischemic cardiomyopathy. ECHO 08/27/12: EF = 45-50% (Previous Echo revealed EF = 25%)  . Paroxysmal atrial fibrillation     On Coumadin  . PSVT (paroxysmal supraventricular tachycardia)     2D ECHO 08/27/12: EF = 45-50% (Previous Echo revealed EF = 25%)  . Sick sinus syndrome   . Cataract   . Cerebral atherosclerosis     Carotid Doppler 08/27/12  SUMMARY - Bilateral ICAs: Demonstrated normal patency without evidence of a significant diamenter reduction, tortuosity or any other vascular abnormality. This was a Normal carotid duplex Doppler Evaluation.  . Carotid artery occlusion     Carotid Doppler 08/27/12 SUMMARY - Bilateral ICAs: Demonstrated normal patency without evidence of a significant diamenter reduction, tortuosity or any other vascular abnormality. This was a Normal carotid duplex Doppler Evaluation.  . DVT (deep venous thrombosis)   . Coronary artery disease     Most recent cath by Dr. Gwenlyn Found 07/02/09 was remarkable for patent stents w/an 80% in-stent stenosis within the obtuse  marginal branch stent, as well as 60% ostial RCA stenosis which was stented with a Taxus Liberte drug-eluting stent. Nuc stress test 09/03/12 was low risk & showed an inferolateral scar. & an EF of 39%  . CAD (coronary artery disease)     S/P stenting of his LAD, circumflex & marginal branch as well as his RCA in 2008.  [name] CHF (congestive heart failure)   . A-fib   . GERD (gastroesophageal reflux disease)   . Shortness of breath dyspnea   . Headache   . Arthritis   . Cancer     basal cell  . H/O mitral valve disorder     Past Surgical History  Procedure Laterality Date  . Total knee arthroplasty Left 09/2010    Surgery was complicated by PAF with a pulmonary embolism documented by CT angiogram.  . Craniotomy  08/15/12    At [facility]. Brain[facility]-guided left frontoparietal craniotomy w/removal of a meningioma.   . Coronary angioplasty with stent placement  07/02/09    Most recent cath by Dr. Gwenlyn Found 07/02/09 was remarkable for patent stents w/an 80% in-stent stenosis within the obtuse marginal branch stent, as well as 60% ostial RCA stenosis which was stented with a Taxus Liberte drug-eluting stent.   . Cardiac catheterization      Past caths in 2008, 2006, 2005 & 2003    History   Social History  . Marital Status: Married    Spouse Name: N/A  . Number of Children: N/A  . Years of Education: N/A   Occupational History  . Not on file.   Social History Main Topics  . Smoking status: Former Smoker    Quit date: 12/20/1987  . Smokeless tobacco: Never Used  . Alcohol Use: Yes     Comment: 4 beers per day  . Drug Use: No  . Sexual Activity: Not on file   Other Topics Concern  . Not on file   Social History Narrative    ROS: no fevers or chills, productive cough, hemoptysis, dysphasia, odynophagia, melena, hematochezia, dysuria, hematuria, rash, seizure activity, orthopnea, PND, pedal edema, claudication. Remaining systems are negative.  Physical Exam: Well-developed well-nourished  in no acute distress.  Skin is warm and dry.  HEENT is normal.  Neck is supple.  Chest is clear to auscultation with normal expansion.  Cardiovascular exam is irregular Abdominal exam nontender or distended. No masses palpated. Extremities show no edema. neuro grossly intact  ECG atrial fibrillation at a rate of 75. Inferior lateral T-wave changes.

## 2014-11-16 NOTE — Telephone Encounter (Signed)
Returning your call from yesterday. °

## 2014-11-16 NOTE — Assessment & Plan Note (Signed)
Blood pressure controlled. Continue present medications. 

## 2014-11-23 ENCOUNTER — Ambulatory Visit: Payer: Medicare Other | Admitting: Anesthesiology

## 2014-11-23 ENCOUNTER — Encounter
Admission: RE | Disposition: A | Discharge status: Home / Self Care | Payer: Self-pay | Source: Ambulatory Visit | Attending: Surgery

## 2014-11-23 ENCOUNTER — Ambulatory Visit
Admission: RE | Admit: 2014-11-23 | Discharge: 2014-11-23 | Disposition: A | Discharge status: Home / Self Care | Payer: Medicare Other | Source: Ambulatory Visit | Attending: Surgery | Admitting: Surgery

## 2014-11-23 ENCOUNTER — Encounter: Payer: Self-pay | Admitting: *Deleted

## 2014-11-23 DIAGNOSIS — Z79899 Other long term (current) drug therapy: Secondary | ICD-10-CM | POA: Diagnosis not present

## 2014-11-23 DIAGNOSIS — Z9889 Other specified postprocedural states: Secondary | ICD-10-CM | POA: Insufficient documentation

## 2014-11-23 DIAGNOSIS — Z7982 Long term (current) use of aspirin: Secondary | ICD-10-CM | POA: Diagnosis not present

## 2014-11-23 DIAGNOSIS — Z8249 Family history of ischemic heart disease and other diseases of the circulatory system: Secondary | ICD-10-CM | POA: Diagnosis not present

## 2014-11-23 DIAGNOSIS — I4891 Unspecified atrial fibrillation: Secondary | ICD-10-CM | POA: Diagnosis not present

## 2014-11-23 DIAGNOSIS — I509 Heart failure, unspecified: Secondary | ICD-10-CM | POA: Diagnosis not present

## 2014-11-23 DIAGNOSIS — Z803 Family history of malignant neoplasm of breast: Secondary | ICD-10-CM | POA: Insufficient documentation

## 2014-11-23 DIAGNOSIS — I10 Essential (primary) hypertension: Secondary | ICD-10-CM | POA: Diagnosis not present

## 2014-11-23 DIAGNOSIS — Z86010 Personal history of colonic polyps: Secondary | ICD-10-CM | POA: Diagnosis not present

## 2014-11-23 DIAGNOSIS — Z86711 Personal history of pulmonary embolism: Secondary | ICD-10-CM | POA: Diagnosis not present

## 2014-11-23 DIAGNOSIS — Z7901 Long term (current) use of anticoagulants: Secondary | ICD-10-CM | POA: Diagnosis not present

## 2014-11-23 DIAGNOSIS — K4090 Unilateral inguinal hernia, without obstruction or gangrene, not specified as recurrent: Secondary | ICD-10-CM | POA: Insufficient documentation

## 2014-11-23 DIAGNOSIS — I341 Nonrheumatic mitral (valve) prolapse: Secondary | ICD-10-CM | POA: Diagnosis not present

## 2014-11-23 DIAGNOSIS — K219 Gastro-esophageal reflux disease without esophagitis: Secondary | ICD-10-CM | POA: Diagnosis not present

## 2014-11-23 DIAGNOSIS — I2510 Atherosclerotic heart disease of native coronary artery without angina pectoris: Secondary | ICD-10-CM | POA: Insufficient documentation

## 2014-11-23 DIAGNOSIS — Z96652 Presence of left artificial knee joint: Secondary | ICD-10-CM | POA: Diagnosis not present

## 2014-11-23 DIAGNOSIS — Z95818 Presence of other cardiac implants and grafts: Secondary | ICD-10-CM | POA: Insufficient documentation

## 2014-11-23 DIAGNOSIS — G4730 Sleep apnea, unspecified: Secondary | ICD-10-CM | POA: Insufficient documentation

## 2014-11-23 DIAGNOSIS — E785 Hyperlipidemia, unspecified: Secondary | ICD-10-CM | POA: Diagnosis not present

## 2014-11-23 DIAGNOSIS — Z87891 Personal history of nicotine dependence: Secondary | ICD-10-CM | POA: Insufficient documentation

## 2014-11-23 HISTORY — PX: INGUINAL HERNIA REPAIR: SHX194

## 2014-11-23 SURGERY — REPAIR, HERNIA, INGUINAL, ADULT
Anesthesia: General | Laterality: Right

## 2014-11-23 MED ORDER — MIDAZOLAM HCL 5 MG/5ML IJ SOLN
INTRAMUSCULAR | Status: DC | PRN
  Administered 2014-11-23: 2 mg via INTRAVENOUS

## 2014-11-23 MED ORDER — BUPIVACAINE-EPINEPHRINE (PF) 0.5% -1:200000 IJ SOLN
INTRAMUSCULAR | Status: AC
  Filled 2014-11-23: qty 30

## 2014-11-23 MED ORDER — ROCURONIUM BROMIDE 100 MG/10ML IV SOLN
INTRAVENOUS | Status: DC | PRN
  Administered 2014-11-23: 50 mg via INTRAVENOUS
  Administered 2014-11-23: 20 mg via INTRAVENOUS

## 2014-11-23 MED ORDER — FENTANYL CITRATE (PF) 100 MCG/2ML IJ SOLN
INTRAMUSCULAR | Status: AC
  Administered 2014-11-23: 25 ug via INTRAVENOUS
  Filled 2014-11-23: qty 2

## 2014-11-23 MED ORDER — CEFAZOLIN SODIUM 1-5 GM-% IV SOLN
INTRAVENOUS | Status: AC
  Administered 2014-11-23: 1 g via INTRAVENOUS
  Filled 2014-11-23: qty 50

## 2014-11-23 MED ORDER — HYDROCODONE-ACETAMINOPHEN 5-325 MG PO TABS
ORAL_TABLET | ORAL | Status: AC
  Filled 2014-11-23: qty 1

## 2014-11-23 MED ORDER — CEFAZOLIN SODIUM 1-5 GM-% IV SOLN
1.0000 g | INTRAVENOUS | Status: AC
  Administered 2014-11-23: 1 g via INTRAVENOUS

## 2014-11-23 MED ORDER — ACETAMINOPHEN 325 MG PO TABS: 325.0000 mg | ORAL_TABLET | ORAL | Status: DC | PRN

## 2014-11-23 MED ORDER — HYDROCODONE-ACETAMINOPHEN 5-325 MG PO TABS
1.0000 | ORAL_TABLET | ORAL | Status: DC | PRN
  Administered 2014-11-23: 1 via ORAL

## 2014-11-23 MED ORDER — PROPOFOL 10 MG/ML IV BOLUS
INTRAVENOUS | Status: DC | PRN
  Administered 2014-11-23: 150 mg via INTRAVENOUS

## 2014-11-23 MED ORDER — EPHEDRINE SULFATE 50 MG/ML IJ SOLN
INTRAMUSCULAR | Status: DC | PRN
  Administered 2014-11-23 (×2): 10 mg via INTRAVENOUS

## 2014-11-23 MED ORDER — GLYCOPYRROLATE 0.2 MG/ML IJ SOLN
INTRAMUSCULAR | Status: DC | PRN
  Administered 2014-11-23: 0.6 mg via INTRAVENOUS

## 2014-11-23 MED ORDER — LIDOCAINE HCL (CARDIAC) 20 MG/ML IV SOLN
INTRAVENOUS | Status: DC | PRN
  Administered 2014-11-23: 80 mg via INTRAVENOUS

## 2014-11-23 MED ORDER — ONDANSETRON HCL 4 MG/2ML IJ SOLN: 4.0000 mg | Freq: Once | INTRAMUSCULAR | Status: DC | PRN

## 2014-11-23 MED ORDER — FENTANYL CITRATE (PF) 250 MCG/5ML IJ SOLN
INTRAMUSCULAR | Status: DC | PRN
  Administered 2014-11-23 (×2): 50 ug via INTRAVENOUS
  Administered 2014-11-23: 150 ug via INTRAVENOUS

## 2014-11-23 MED ORDER — LACTATED RINGERS IV SOLN
INTRAVENOUS | Status: DC
  Administered 2014-11-23 (×2): via INTRAVENOUS

## 2014-11-23 MED ORDER — LIDOCAINE HCL (CARDIAC) 20 MG/ML IV SOLN: INTRAVENOUS | Status: DC | PRN

## 2014-11-23 MED ORDER — FENTANYL CITRATE (PF) 100 MCG/2ML IJ SOLN
25.0000 ug | INTRAMUSCULAR | Status: DC | PRN
  Administered 2014-11-23 (×2): 25 ug via INTRAVENOUS

## 2014-11-23 MED ORDER — NEOSTIGMINE METHYLSULFATE 10 MG/10ML IV SOLN
INTRAVENOUS | Status: DC | PRN
  Administered 2014-11-23: 4 mg via INTRAVENOUS

## 2014-11-23 MED ORDER — BUPIVACAINE-EPINEPHRINE (PF) 0.5% -1:200000 IJ SOLN
INTRAMUSCULAR | Status: DC | PRN
  Administered 2014-11-23: 19 mL via PERINEURAL

## 2014-11-23 MED ORDER — ONDANSETRON HCL 4 MG/2ML IJ SOLN
INTRAMUSCULAR | Status: DC | PRN
  Administered 2014-11-23: 4 mg via INTRAVENOUS

## 2014-11-23 SURGICAL SUPPLY — 25 items
BLADE CLIPPER SURG (BLADE) ×3 IMPLANT
BLADE SURG 15 STRL LF DISP TIS (BLADE) ×1 IMPLANT
BLADE SURG 15 STRL SS (BLADE) ×2
CANISTER SUCT 1200ML W/VALVE (MISCELLANEOUS) ×3 IMPLANT
CHLORAPREP W/TINT 26ML (MISCELLANEOUS) ×6 IMPLANT
DRAIN PENROSE 5/8X18 LTX STRL (WOUND CARE) ×3 IMPLANT
DRAPE PED LAPAROTOMY (DRAPES) ×3 IMPLANT
GLOVE BIO SURGEON STRL SZ7.5 (GLOVE) ×21 IMPLANT
GOWN STRL REUS W/ TWL LRG LVL3 (GOWN DISPOSABLE) ×4 IMPLANT
GOWN STRL REUS W/TWL LRG LVL3 (GOWN DISPOSABLE) ×8
KIT RM TURNOVER STRD PROC AR (KITS) ×3 IMPLANT
LABEL OR SOLS (LABEL) IMPLANT
LIQUID BAND (GAUZE/BANDAGES/DRESSINGS) ×3 IMPLANT
MESH SYNTHETIC 4X6 SOFT BARD (Mesh General) ×1 IMPLANT
MESH SYNTHETIC SOFT BARD 4X6 (Mesh General) ×2 IMPLANT
NDL SAFETY 25GX1.5 (NEEDLE) ×3 IMPLANT
NS IRRIG 500ML POUR BTL (IV SOLUTION) ×3 IMPLANT
PACK BASIN MINOR ARMC (MISCELLANEOUS) ×3 IMPLANT
PAD GROUND ADULT SPLIT (MISCELLANEOUS) ×3 IMPLANT
SUT CHROMIC 4 0 RB 1X27 (SUTURE) ×3 IMPLANT
SUT MNCRL AB 4-0 PS2 18 (SUTURE) ×3 IMPLANT
SUT SURGILON 0 30 BLK (SUTURE) ×9 IMPLANT
SUT VIC AB 4-0 SH 27 (SUTURE) ×2
SUT VIC AB 4-0 SH 27XANBCTRL (SUTURE) ×1 IMPLANT
SYRINGE 10CC LL (SYRINGE) ×3 IMPLANT

## 2014-11-23 NOTE — Anesthesia Preprocedure Evaluation (Addendum)
Anesthesia Evaluation  Patient identified by MRN, date of birth, ID band Patient awake    Reviewed: Allergy & Precautions, NPO status , [name] Chart, lab work & pertinent test results, reviewed documented beta blocker date and time   Airway Mallampati: II  TM Distance: >3 FB Neck ROM: Full    Dental  (+) Partial Upper   Pulmonary shortness of breath and with exertion, sleep apnea , former smoker,  breath sounds clear to auscultation  Pulmonary exam normal       Cardiovascular hypertension, + CAD, + Peripheral Vascular Disease and +CHF Rate:Normal  Hx of Afib and NSVT   Neuro/Psych  Headaches, TIAnegative psych ROS   GI/Hepatic Neg liver ROS, GERD-  Controlled,  Endo/Other  negative endocrine ROS  Renal/GU negative Renal ROS  negative genitourinary   Musculoskeletal  (+) Arthritis -, Osteoarthritis,    Abdominal Normal abdominal exam  (+)   Peds negative pediatric ROS (+)  Hematology negative hematology ROS (+)   Anesthesia Other Findings   Reproductive/Obstetrics                            Anesthesia Physical Anesthesia Plan  ASA: III  Anesthesia Plan: General   Post-op Pain Management:    Induction: Intravenous  Airway Management Planned: Oral ETT  Additional Equipment:   Intra-op Plan:   Post-operative Plan: Extubation in OR  Informed Consent: I have reviewed the patients History and Physical, chart, labs and discussed the procedure including the risks, benefits and alternatives for the proposed anesthesia with the patient or authorized representative who has indicated his/her understanding and acceptance.   Dental advisory given  Plan Discussed with: CRNA and Surgeon  Anesthesia Plan Comments:         Anesthesia Quick Evaluation

## 2014-11-23 NOTE — Transfer of Care (Deleted)
Immediate Anesthesia Transfer of Care Note  Patient: [name]  Procedure(s) Performed: Procedure(s): HERNIA REPAIR INGUINAL ADULT (Right)  Patient Location: PACU  Anesthesia Type:General  Level of Consciousness: sedated  Airway & Oxygen Therapy: Patient Spontanous Breathing and Patient connected to face mask oxygen  Post-op Assessment: Report given to RN and Post -op Vital signs reviewed and stable  Post vital signs: Reviewed and stable  Last Vitals:  Filed Vitals:   11/23/14 1403  BP:   Pulse:   Temp: 36.2 C  Resp:     Complications: No apparent anesthesia complications

## 2014-11-23 NOTE — Op Note (Signed)
OPERATIVE REPORT  PREOPERATIVE DIAGNOSIS: right inguinal hernia  POSTOPERATIVE DIAGNOSIS:right  inguinal hernia  PROCEDURE:  right inguinal hernia repair  ANESTHESIA:  General  SURGEON:  Rochel Brome M.D.  INDICATIONS:  With the patient on the operating table in the supine position the right lower quadrant was prepared with clippers and with ChloraPrep and draped in a sterile manner. A transversely oriented suprapubic incision was made and carried down through subcutaneous tissues. Electrocautery was used for hemostasis. One traversing vein was divided between 4-0 chromic suture ligatures The Scarpa's fascia was incised. The external oblique aponeurosis was incised along the course of its fibers to open the external ring and expose the inguinal cord structures. Cord structures were mobilized. Cremaster fibers were spread to examine the cord structures. There was an indirect hernia sac which was dissected free from surrounding structures and followed up into the internal ring. The sac was opened there was a small amount of bile within the sac which was reduced small amount of omentum in the sac which was reduced. A high ligation of the sac was done with a 4-0 Vicryl suture ligature. The sac was excised. The sac was not sent for pathology. There was also a direct inguinal hernia creating a bulge of approximately 3-4 cm. This was inverted. The repair was carried out with a roll of 0 Surgilon sutures beginning at the pubic tubercle suturing the conjoined tendon to the shelving edge of the inguinal ligament incorporating transversalis fascia to the repair. The last stitch led to satisfactory narrowing of the internal ring. A relaxing incision was made medially. A Bard soft mesh was cut to create oval shape of some 4 x 6 cm. A notch was cut out to straddle the internal ring-was sutured to the repair and sutured on both sides of the internal ring. It was also sutured to both sides of the relaxing incision.  Hemostasis was intact. Cord structures were replaced on the floor of the inguinal canal. Cut edges of the external oblique aponeurosis were closed with a running 4-0 Vicryl. The deep fascia superior and lateral to the repair site was infiltrated with half percent Sensorcaine with epinephrine. Subcutaneous tissues were infiltrated as well. Scarpa's fascia was closed with interrupted 4-0 Monocryl sutures. The skin was closed with running 4-0 Monocryl subcutaneous suture and LiquiBand. The testicle remained in the scrotum  The patient appeared to tolerate the procedure satisfactorily and was then prepared for transfer to the recovery room.  Rochel Brome M.D.

## 2014-11-23 NOTE — Anesthesia Postprocedure Evaluation (Signed)
  Anesthesia Post-op Note  Patient: [name]  Procedure(s) Performed: Procedure(s): HERNIA REPAIR INGUINAL ADULT (Right)  Anesthesia type:General  Patient location: PACU  Post pain: Pain level controlled  Post assessment: Post-op Vital signs reviewed, [name] Cardiovascular Status Stable, Respiratory Function Stable, Patent Airway and No signs of Nausea or vomiting  Post vital signs: Reviewed and stable  Last Vitals:  Filed Vitals:   11/23/14 1404  BP: 151/98  Pulse: 74  Temp: 36.2 C  Resp: 19    Level of consciousness: awake, alert  and patient cooperative  Complications: No apparent anesthesia complications

## 2014-11-23 NOTE — Discharge Instructions (Addendum)
May resume aspirin on Friday.  May resume Pradaxa on Thursday.  May shower  Avoid straining and heavy lifting for 1 month  Follow-up with Dr. Tamala Julian in the surgery office in 2 weeksGeneral Anesthesia, Care After Refer to this sheet in the next few weeks. These instructions provide you with information on caring for yourself after your procedure. Your health care provider may also give you more specific instructions. Your treatment has been planned according to current medical practices, but problems sometimes occur. Call your health care provider if you have any problems or questions after your procedure. WHAT TO EXPECT AFTER THE PROCEDURE After the procedure, it is typical to experience:  Sleepiness.  Nausea and vomiting. HOME CARE INSTRUCTIONS  For the first 24 hours after general anesthesia:  Have a responsible person with you.  Do not drive a car. If you are alone, do not take public transportation.  Do not drink alcohol.  Do not take medicine that has not been prescribed by your health care provider.  Do not sign important papers or make important decisions.  You may resume a normal diet and activities as directed by your health care provider.  Change bandages (dressings) as directed.  If you have questions or problems that seem related to general anesthesia, call the hospital and ask for the anesthetist or anesthesiologist on call. SEEK MEDICAL CARE IF:  You have nausea and vomiting that continue the day after anesthesia.  You develop a rash. SEEK IMMEDIATE MEDICAL CARE IF:   You have difficulty breathing.  You have chest pain.  You have any allergic problems. Document Released: 10/08/2000 Document Revised: 07/07/2013 Document Reviewed: 01/15/2013 [facility] Patient Information 2015 [place], [place]. This information is not intended to replace advice given to you by your health care provider. Make sure you discuss any questions you have with your health care  provider. Open Hernia Repair, Care After Refer to this sheet in the next few weeks. These instructions provide you with information on caring for yourself after your procedure. Your health care provider may also give you more specific instructions. Your treatment has been planned according to current medical practices, but problems sometimes occur. Call your health care provider if you have any problems or questions after your procedure. WHAT TO EXPECT AFTER THE PROCEDURE After your procedure, it is typical to have the following:  Pain in your abdomen, especially along your incision. You will be given pain medicines to control the pain.  Constipation. You may be given a stool softener to help prevent this. HOME CARE INSTRUCTIONS   Only take over-the-counter or prescription medicines as directed by your health care provider.  Keep the wound dry and clean. You may wash the wound gently with soap and water 48 hours after surgery. Gently blot or dab the wound dry. Do not take baths, use swimming pools, or use hot tubs for 10 days or until your health care provider approves.  Change bandages (dressings) as directed by your health care provider.  Continue your normal diet as directed by your health care provider. Eat plenty of fruits and vegetables to help prevent constipation.  Drink enough fluids to keep your urine clear or pale yellow. This also helps prevent constipation.  Do not drive until your health care provider says it is okay.  Do not lift anything heavier than 10 pounds (4.5 kg) or play contact sports for 4 weeks or until your health care provider approves.  Follow up with your health care provider as directed. Ask  your health care provider when to make an appointment to have your stitches (sutures) or staples removed. SEEK MEDICAL CARE IF:   You have increased bleeding coming from the incision site.  You have blood in your stool.  You have increasing pain in the wound.  You  see redness or swelling in the wound.  You have fluid (pus) coming from the wound.  You have a fever.  You notice a bad smell coming from the wound or dressing. SEEK IMMEDIATE MEDICAL CARE IF:   You develop a rash.  You have chest pain or shortness of breath.  You feel lightheaded or feel faint. Document Released: 01/19/2005 Document Revised: 04/22/2013 Document Reviewed: 02/11/2013 [facility] Patient Information 2015 [place], [place]. This information is not intended to replace advice given to you by your health care provider. Make sure you discuss any questions you have with your health care provider.

## 2014-11-23 NOTE — Anesthesia Procedure Notes (Addendum)
Procedure Name: Intubation Date/Time: 11/23/2014 11:58 AM Performed by: Delaney Meigs Pre-anesthesia Checklist: Patient identified, Emergency Drugs available, Suction available, Patient being monitored and Timeout performed Patient Re-evaluated:Patient Re-evaluated prior to inductionOxygen Delivery Method: Circle system utilized Preoxygenation: Pre-oxygenation with 100% oxygen Intubation Type: IV induction Ventilation: Mask ventilation without difficulty Laryngoscope Size: Mac and 3 Tube type: Oral Number of attempts: 1 Airway Equipment and Method: Stylet Placement Confirmation: ETT inserted through vocal cords under direct vision,  positive ETCO2 and breath sounds checked- equal and bilateral Secured at: 21 cm Tube secured with: Tape Dental Injury: Teeth and Oropharynx as per pre-operative assessment

## 2014-11-23 NOTE — H&P (Signed)
  He reports no change in condition since the office H&P was done.  He continues his same medicines although has been off of aspirin for 1 week.  I discussed with him the plan for right inguinal hernia repair and marked the right side with a yes marked

## 2014-11-23 NOTE — Transfer of Care (Signed)
Immediate Anesthesia Transfer of Care Note  Patient: [name]  Procedure(s) Performed: Procedure(s): HERNIA REPAIR INGUINAL ADULT (Right)  Patient Location: PACU  Anesthesia Type:General  Level of Consciousness: awake, alert  and oriented  Airway & Oxygen Therapy: Patient Spontanous Breathing and Patient connected to face mask oxygen  Post-op Assessment: Report given to RN, Post -op Vital signs reviewed and stable and Patient moving all extremities X 4  Post vital signs: Reviewed and stable  Last Vitals:  Filed Vitals:   11/23/14 1404  BP: 151/98  Pulse: 74  Temp: 36.2 C  Resp: 19    Complications: No apparent anesthesia complications

## 2014-11-25 ENCOUNTER — Encounter: Payer: Self-pay | Admitting: Surgery

## 2014-11-26 ENCOUNTER — Telehealth: Payer: Self-pay | Admitting: Cardiovascular Disease

## 2014-11-26 NOTE — Telephone Encounter (Signed)
No answer when dialed. 

## 2014-11-26 NOTE — Telephone Encounter (Signed)
New Message    Patient is calling in regards to his CPAP machine and how to properly wear it. Please give patient a call.

## 2014-11-29 NOTE — Telephone Encounter (Signed)
Called, left message on machine instructing to contact supplier or contact us if further questions.

## 2014-11-30 ENCOUNTER — Ambulatory Visit: Payer: PRIVATE HEALTH INSURANCE | Admitting: Cardiovascular Disease

## 2014-12-02 ENCOUNTER — Telehealth: Payer: Self-pay | Admitting: Cardiovascular Disease

## 2014-12-02 NOTE — Telephone Encounter (Signed)
Please call,having problem with his C Pap machine.

## 2014-12-03 ENCOUNTER — Telehealth: Payer: Self-pay | Admitting: *Deleted

## 2014-12-03 NOTE — Telephone Encounter (Signed)
Called [facility] and notified them that patient is having some problems with his CPAP machine. Requested for them to contact patient for service.

## 2014-12-03 NOTE — Telephone Encounter (Signed)
Returned a call to patient. He states that he is having some issues with his CPAP machine. Not clear what he was meaning. Could not get any clarity on what problem he is having. 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 i will contact them and have them to call him to see if they can set up a time to meet with him to evaluate the problem.

## 2015-01-25 ENCOUNTER — Encounter: Payer: Self-pay | Admitting: Cardiovascular Disease

## 2015-01-25 ENCOUNTER — Ambulatory Visit (INDEPENDENT_AMBULATORY_CARE_PROVIDER_SITE_OTHER): Payer: Medicare Other | Admitting: Cardiovascular Disease

## 2015-01-25 VITALS — BP 110/66 | Ht 71.0 in | Wt 174.0 lb

## 2015-01-25 DIAGNOSIS — E785 Hyperlipidemia, unspecified: Secondary | ICD-10-CM | POA: Diagnosis not present

## 2015-01-25 DIAGNOSIS — I255 Ischemic cardiomyopathy: Secondary | ICD-10-CM

## 2015-01-25 DIAGNOSIS — I10 Essential (primary) hypertension: Secondary | ICD-10-CM | POA: Diagnosis not present

## 2015-01-25 DIAGNOSIS — I480 Paroxysmal atrial fibrillation: Secondary | ICD-10-CM | POA: Diagnosis not present

## 2015-01-25 DIAGNOSIS — I2583 Coronary atherosclerosis due to lipid rich plaque: Secondary | ICD-10-CM

## 2015-01-25 DIAGNOSIS — I2510 Atherosclerotic heart disease of native coronary artery without angina pectoris: Secondary | ICD-10-CM

## 2015-01-25 NOTE — Patient Instructions (Signed)
Your physician wants you to follow-up in: 1 year with Dr Berry. You will receive a reminder letter in the mail two months in advance. If you don't receive a letter, please call our office to schedule the follow-up appointment.  

## 2015-01-25 NOTE — Assessment & Plan Note (Signed)
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

## 2015-01-25 NOTE — Assessment & Plan Note (Signed)
History of hypertension with blood pressure measured at 110/86. He is on carvedilol and lisinopril. Continue current meds at current dosing

## 2015-01-25 NOTE — Progress Notes (Signed)
01/25/2015 [name]   1940-09-05  [id number]  Primary Physician Lenard Simmer, MD Primary Cardiologist: Lorretta Harp MD [name]   HPI:  The patient is a 74 year old, thin and fit-appearing Caucasian male who I last saw in the office 11 months ago. He is a father of 2, grandfather to 2 grandchildren. He has history of CAD status post stenting of his LAD, circumflex and marginal branch as well as his RCA in the past. His most recent cath performed by myself July 02, 2009, was remarkable for patent stents with an 80% in-stent restenosis within the obtuse marginal branch stent, as well as a 60% ostial RCA stenosis which I stented using a Taxus Liberte drug-eluting stent. His other problems include obstructive sleep apnea, hypertension and hyperlipidemia. 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 He has Barrett esophagus and gets periodic endoscopies and biopsies and because of this I have stopped his Coumadin in the past. When I saw him, he denied chest pain or shortness of breath but was complaining of occasional blurred vision. He had a Myoview stress test performed that was low risk and showed inferolateral scar. He had a 2D echo that revealed an EF of 45% to 50% similar to a prior echo. Carotid Dopplers were unremarkable. He did have an event monitor that showed evidence of PAF as well as nonsustained ventricular tachycardia with a 26-beat run of monomorphic VT during which he was asymptomatic.he is fairly active and works out 6 days a week. He denies chest pain or shortness of breath. He saw Dr. Stanford Breed in the office 11/16/14 for preoperative clearance before elective right inguinal hernia repair. This was performed without complication. No functional study was ordered prior to that.   Current Outpatient Prescriptions  Medication Sig  Dispense Refill  . Acetaminophen (TYLENOL PO) Take 1 tablet by mouth every 6 (six) hours as needed (pain).    [name] aspirin 81 MG tablet Take 81 mg by mouth daily.    [name] atorvastatin (LIPITOR) 80 MG tablet Take 80 mg by mouth daily.    . calcium-vitamin D (OSCAL WITH D) 500-200 MG-UNIT per tablet Take 1 tablet by mouth 2 (two) times daily.     . carvedilol (COREG) 6.25 MG tablet Take 1 tablet (6.25 mg total) by mouth 2 (two) times daily. 180 tablet 3  . dabigatran (PRADAXA) 150 MG CAPS capsule Take 150 mg by mouth every 12 (twelve) hours.    [name] lisinopril (PRINIVIL,ZESTRIL) 20 MG tablet Take 10 mg by mouth daily.    . Multiple Vitamins-Minerals (MULTIVITAMIN WITH MINERALS) tablet Take 1 tablet by mouth daily.    . OMEGA 3-6-9 FATTY ACIDS PO Take 1 capsule by mouth once.    [name] omeprazole (PRILOSEC) 20 MG capsule Take 40 mg by mouth 2 (two) times daily.     [name] testosterone (ANDROGEL) 50 MG/5GM GEL Place 5 g onto the skin daily.    . vitamin B-12 (CYANOCOBALAMIN) 1000 MCG tablet Take 1,000 mcg by mouth daily.     No current facility-administered medications for this visit.    No Known Allergies  History   Social History  . Marital Status: Married    Spouse Name: N/A  . Number of Children: N/A  . Years of Education: N/A   Occupational History  . Not on file.   Social History Main Topics  .  Smoking status: Former Smoker    Quit date: 12/20/1987  . Smokeless tobacco: Never Used  . Alcohol Use: Yes     Comment: 4 beers per day  . Drug Use: No  . Sexual Activity: Not on file   Other Topics Concern  . Not on file   Social History Narrative     Review of Systems: General: negative for chills, fever, night sweats or weight changes.  Cardiovascular: negative for chest pain, dyspnea on exertion, edema, orthopnea, palpitations, paroxysmal nocturnal dyspnea or shortness of breath Dermatological: negative for rash Respiratory: negative for cough or wheezing Urologic: negative for  hematuria Abdominal: negative for nausea, vomiting, diarrhea, bright red blood per rectum, melena, or hematemesis Neurologic: negative for visual changes, syncope, or dizziness All other systems reviewed and are otherwise negative except as noted above.    Blood pressure 110/66, height 5\' 11"  (1.803 m), weight 174 lb (78.926 kg).  General appearance: alert and no distress Neck: no adenopathy, no carotid bruit, no JVD, supple, symmetrical, trachea midline and thyroid not enlarged, symmetric, no tenderness/mass/nodules Lungs: clear to auscultation bilaterally Heart: regular rate and rhythm, S1, S2 normal, no murmur, click, rub or gallop Extremities: extremities normal, atraumatic, no cyanosis or edema  EKG not performed today  ASSESSMENT AND PLAN:   Paroxysmal atrial fibrillation History of remote PAF as well as remote pulmonary embolism on Pradaxa maintaining sinus rhythm  Hyperlipidemia History of hyperlipidemia on atorvastatin by his PCP and the [facility]  HTN (hypertension) History of hypertension with blood pressure measured at 110/86. He is on carvedilol and lisinopril. Continue current meds at current dosing  Cardiomyopathy, ischemic History of ischemic cardiomyopathy  with an ejection fraction in the 45-50% range by 2-D echo. He is very active and denies chest pain or shortness of breath.  CAD (coronary artery disease)- last PCI 12/10 History of CAD status post stenting of his LAD, circumflex marginal branch as well as RCA in the past. His most recent cath performed by myself 07/02/09 was remarkable for patent stents with 80% "in-stent restenosis" within the obtuse marginal branch stent as well as 60% ostial RCA stenosis which I stented using a Taxus Liberte drug-eluting stent. His last Myoview stress test performed 09/03/12 was low risk and nonischemic.      Lorretta Harp MD FACP,FACC,FAHA, [facility] 01/25/2015 11:58 AM

## 2015-01-25 NOTE — Assessment & Plan Note (Signed)
History of CAD status post stenting of his LAD, circumflex marginal branch as well as RCA in the past. His most recent cath performed by myself 07/02/09 was remarkable for patent stents with 80% "in-stent restenosis" within the obtuse marginal branch stent as well as 60% ostial RCA stenosis which I stented using a Taxus Liberte drug-eluting stent. His last Myoview stress test performed 09/03/12 was low risk and nonischemic.

## 2015-01-25 NOTE — Assessment & Plan Note (Signed)
History of ischemic cardiomyopathy  with an ejection fraction in the 45-50% range by 2-D echo. He is very active and denies chest pain or shortness of breath.

## 2015-01-25 NOTE — Assessment & Plan Note (Signed)
History of hyperlipidemia on atorvastatin by his PCP and the [facility]

## 2015-01-31 ENCOUNTER — Encounter: Payer: Self-pay | Admitting: Cardiovascular Disease

## 2015-05-10 ENCOUNTER — Telehealth: Payer: Self-pay | Admitting: Cardiovascular Disease

## 2015-05-10 NOTE — Telephone Encounter (Signed)
Not notes in chart that pt was called from our office. Pt made aware.

## 2015-05-10 NOTE — Telephone Encounter (Signed)
Returning call,does not know who called him.

## 2015-08-01 ENCOUNTER — Emergency Department: Payer: Medicare Other

## 2015-08-01 ENCOUNTER — Emergency Department
Admission: EM | Admit: 2015-08-01 | Discharge: 2015-08-01 | Disposition: A | Discharge status: Home / Self Care | Payer: Medicare Other | Attending: Emergency Medicine | Admitting: Emergency Medicine

## 2015-08-01 ENCOUNTER — Encounter: Payer: Self-pay | Admitting: *Deleted

## 2015-08-01 DIAGNOSIS — Z87891 Personal history of nicotine dependence: Secondary | ICD-10-CM | POA: Insufficient documentation

## 2015-08-01 DIAGNOSIS — R51 Headache: Secondary | ICD-10-CM | POA: Diagnosis present

## 2015-08-01 DIAGNOSIS — I10 Essential (primary) hypertension: Secondary | ICD-10-CM | POA: Diagnosis not present

## 2015-08-01 DIAGNOSIS — H538 Other visual disturbances: Secondary | ICD-10-CM | POA: Insufficient documentation

## 2015-08-01 DIAGNOSIS — Z79899 Other long term (current) drug therapy: Secondary | ICD-10-CM | POA: Diagnosis not present

## 2015-08-01 DIAGNOSIS — Z7982 Long term (current) use of aspirin: Secondary | ICD-10-CM | POA: Insufficient documentation

## 2015-08-01 LAB — CBC
HCT: 40.8 % (ref 40.0–52.0)
Hemoglobin: 13.6 g/dL (ref 13.0–18.0)
MCH: 33.4 pg (ref 26.0–34.0)
MCHC: 33.4 g/dL (ref 32.0–36.0)
MCV: 100.2 fL — ABNORMAL HIGH (ref 80.0–100.0)
PLATELETS: 138 10*3/uL — AB (ref 150–440)
RBC: 4.07 MIL/uL — AB (ref 4.40–5.90)
RDW: 15.4 % — ABNORMAL HIGH (ref 11.5–14.5)
WBC: 4.8 10*3/uL (ref 3.8–10.6)

## 2015-08-01 LAB — BASIC METABOLIC PANEL
Anion gap: 6 (ref 5–15)
BUN: 16 mg/dL (ref 6–20)
CALCIUM: 9 mg/dL (ref 8.9–10.3)
CO2: 26 mmol/L (ref 22–32)
Chloride: 107 mmol/L (ref 101–111)
Creatinine, Ser: 0.69 mg/dL (ref 0.61–1.24)
GFR calc non Af Amer: >60 mL/min (ref >60)
Glucose, Bld: 106 mg/dL — ABNORMAL HIGH (ref 65–99)
Potassium: 3.9 mmol/L (ref 3.5–5.1)
Sodium: 139 mmol/L (ref 135–145)

## 2015-08-01 LAB — TROPONIN I

## 2015-08-01 NOTE — ED Provider Notes (Signed)
[facility] Emergency Department Provider Note  ____________________________________________  Time seen: 80  I have reviewed the triage vital signs and the nursing notes.  History by:    HISTORY  Chief Complaint Hypertension    HPI [name] is a 75 y.o. male who presents emergency Department with concerns for his blood pressure today. He uses a portable blood pressure monitor and he noted that he had blood pressures that ranged from [id number] systolic with 92 - 123456 diastolic.  The initial complaint in triage included some blurred vision and a headache. He does not mention any acute symptoms when we speak of his emergent problem today. Direct conversation, he does start to report that he has a history of arthritis in his neck and some neck discomfort into the base of his head. He also reports that he has a long history of mild blurred vision. He denies any acute changes. He denies any severe headache. He denies any chest pain, palpitations, near syncope, or weakness. He does have a history of coronary artery disease with many cardiac stents. He recently were Holter monitor and tells me that the results of the Holter monitor indicated he may need a pacemaker. He has seen his primary physician recently as well as his cardiologist in [place].  He denies any focal weakness or loss of sensation.  Patient does report that he did not sleep well last night.  He does have a history of hypertension for which he takes medications.    Past Medical History  Diagnosis Date  . Obstructive sleep apnea     Sleep Study in 2011 AHI 3.2. 100% compliance with CPAP.  [name] Hypertension   . Hyperlipidemia   . Pulmonary embolism (HCC)     On Coumadin  . Chronic anticoagulation     For PE & PAF  . Barrett's esophagus     Gets periodic endoscopies & biopsies. Coumadin has been held in the past for these precedures.   . Blurred vision   . Meningioma ([place]) 08/15/12     BrainLAB-guided left frontoparietal craniotomy w/removal of a meningioma.   [name] TIA (transient ischemic attack)   . Cardiomyopathy ([place])     Significant ischemic cardiomyopathy. ECHO 08/27/12: EF = 45-50% (Previous Echo revealed EF = 25%)  . Paroxysmal atrial fibrillation (HCC)     On Coumadin  . PSVT (paroxysmal supraventricular tachycardia) ([place])     2D ECHO 08/27/12: EF = 45-50% (Previous Echo revealed EF = 25%)  . Sick sinus syndrome ([place])   . Cataract   . Cerebral atherosclerosis     Carotid Doppler 08/27/12 SUMMARY - Bilateral ICAs: Demonstrated normal patency without evidence of a significant diamenter reduction, tortuosity or any other vascular abnormality. This was a Normal carotid duplex Doppler Evaluation.  . Carotid artery occlusion     Carotid Doppler 08/27/12 SUMMARY - Bilateral ICAs: Demonstrated normal patency without evidence of a significant diamenter reduction, tortuosity or any other vascular abnormality. This was a Normal carotid duplex Doppler Evaluation.  . DVT (deep venous thrombosis) ([place])   . Coronary artery disease     Most recent cath by Dr. Gwenlyn Found 07/02/09 was remarkable for patent stents w/an 80% in-stent stenosis within the obtuse marginal branch stent, as well as 60% ostial RCA stenosis which was stented with a Taxus Liberte drug-eluting stent. Nuc stress test 09/03/12 was low risk & showed an inferolateral scar. & an EF of 39%  . CAD (coronary artery disease)     S/P stenting  of his LAD, circumflex & marginal branch as well as his RCA in 2008.  [name] CHF (congestive heart failure) ([place])   . A-fib ([place])   . GERD (gastroesophageal reflux disease)   . Shortness of breath dyspnea   . Headache   . Arthritis   . 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 H/O mitral valve disorder     Patient Active Problem List   Diagnosis Date Noted  . Preop cardiovascular exam 11/16/2014  . Palpitations 08/13/2013  . Paroxysmal atrial fibrillation ([place]) 03/26/2013  . Hyperlipidemia 03/26/2013   . Barrett's esophagus 03/26/2013  . Obstructive sleep apnea 03/26/2013  . Cardiomyopathy, ischemic 12/19/2012  . HTN (hypertension) 12/19/2012  . Hx pulmonary embolism 12/19/2012  . NSVT (nonsustained ventricular tachycardia) ([place]) 10/23/2012  . CAD (coronary artery disease)- last PCI 12/10 10/23/2012  . PSVT (paroxysmal supraventricular tachycardia) ([place]) 09/30/2012  . Long term (current) use of anticoagulants 09/30/2012    Past Surgical History  Procedure Laterality Date  . Total knee arthroplasty Left 09/2010    Surgery was complicated by PAF with a pulmonary embolism documented by CT angiogram.  . Craniotomy  08/15/12    At [facility]. BrainLAB-guided left frontoparietal craniotomy w/removal of a meningioma.   . Coronary angioplasty with stent placement  07/02/09    Most recent cath by Dr. Gwenlyn Found 07/02/09 was remarkable for patent stents w/an 80% in-stent stenosis within the obtuse marginal branch stent, as well as 60% ostial RCA stenosis which was stented with a Taxus Liberte drug-eluting stent.   . Cardiac catheterization      Past caths in 2008, 2006, 2005 & 2003  . Cataract extraction, bilateral Bilateral   . Inguinal hernia repair Right 11/23/2014    Procedure: HERNIA REPAIR INGUINAL ADULT;  Surgeon: Rochel Brome, MD;  Location: [facility] ORS;  Service: General;  Laterality: Right;    Current Outpatient Rx  Name  Route  Sig  Dispense  Refill  . Acetaminophen (TYLENOL PO)   Oral   Take 1 tablet by mouth every 6 (six) hours as needed (pain).         [name] aspirin 81 MG tablet   Oral   Take 81 mg by mouth daily.         [name] atorvastatin (LIPITOR) 80 MG tablet   Oral   Take 80 mg by mouth daily.         . calcium-vitamin D (OSCAL WITH D) 500-200 MG-UNIT per tablet   Oral   Take 1 tablet by mouth 2 (two) times daily.          . carvedilol (COREG) 6.25 MG tablet   Oral   Take 1 tablet (6.25 mg total) by mouth 2 (two) times daily.   180 tablet   3   . dabigatran (PRADAXA)  150 MG CAPS capsule   Oral   Take 150 mg by mouth every 12 (twelve) hours.         [name] lisinopril (PRINIVIL,ZESTRIL) 20 MG tablet   Oral   Take 10 mg by mouth daily.         . Multiple Vitamins-Minerals (MULTIVITAMIN WITH MINERALS) tablet   Oral   Take 1 tablet by mouth daily.         . OMEGA 3-6-9 FATTY ACIDS PO   Oral   Take 1 capsule by mouth once.         [name] omeprazole (PRILOSEC) 20 MG capsule   Oral   Take 40 mg by  mouth 2 (two) times daily.          [name] testosterone (ANDROGEL) 50 MG/5GM GEL   Transdermal   Place 5 g onto the skin daily.         . vitamin B-12 (CYANOCOBALAMIN) 1000 MCG tablet   Oral   Take 1,000 mcg by mouth daily.           Allergies Review of patient's allergies indicates no known allergies.  Family History  Problem Relation Age of Onset  . Cancer Mother   . Cardiomyopathy Sister   . Cancer Other     Breast    Social History Social History  Substance Use Topics  . Smoking status: Former Smoker    Quit date: 12/20/1987  . Smokeless tobacco: Never Used  . Alcohol Use: Yes     Comment: 4 beers per day    Review of Systems  Constitutional: Negative for fever/chills. Patient reports he did not sleep too well last night. ENT: Negative for congestion. Cardiovascular: Negative for chest pain. Notable for mild elevation in the patient's blood pressure. See history of present illness Respiratory: Negative for cough. Gastrointestinal: Negative for abdominal pain, vomiting and diarrhea. Genitourinary: Negative for dysuria. Musculoskeletal: No back pain. Skin: Negative for rash. Neurological: Negative for headache or focal weakness   10-point ROS otherwise negative.  ____________________________________________   PHYSICAL EXAM:  VITAL SIGNS: ED Triage Vitals  Enc Vitals Group     BP 08/01/15 1633 156/78 mmHg     Pulse Rate 08/01/15 1633 70     Resp 08/01/15 1633 18     Temp 08/01/15 1633 98.1 F (36.7 C)     Temp  Source 08/01/15 1633 Oral     SpO2 08/01/15 1633 96 %     Weight 08/01/15 1633 182 lb (82.555 kg)     Height 08/01/15 1633 5\' 11"  (1.803 m)     Head Cir --      Peak Flow --      Pain Score 08/01/15 1633 0     Pain Loc --      Pain Edu? --      Excl. in [place]? --     Constitutional: Alert and oriented. Well appearing and in no distress. ENT   Head: Normocephalic and atraumatic.   Nose: No congestion/rhinnorhea.       Mouth: No erythema, no swelling   Cardiovascular: Normal rate, regular rhythm, no murmur noted Respiratory:  Normal respiratory effort, no tachypnea.    Breath sounds are clear and equal bilaterally.  Gastrointestinal: Soft, no distention. Nontender Back: No muscle spasm, no tenderness, no CVA tenderness. Musculoskeletal: No deformity noted. Nontender with normal range of motion in all extremities.  No noted edema. Neurologic:  Communicative. Normal appearing spontaneous movement in all 4 extremities. Equal grip strength bilaterally. No pronator drift. Intact sensation throughout. No focal neurologic deficits are appreciated.  Skin:  Skin is warm, dry. No rash noted. Psychiatric: Mood and affect are normal. Speech and behavior are normal.  ____________________________________________    LABS (pertinent positives/negatives)  Labs Reviewed  BASIC METABOLIC PANEL - Abnormal; Notable for the following:    Glucose, Bld 106 (*)    All other components within normal limits  CBC - Abnormal; Notable for the following:    RBC 4.07 (*)    MCV 100.2 (*)    RDW 15.4 (*)    Platelets 138 (*)    All other components within normal limits  TROPONIN I  ____________________________________________   EKG  ED ECG REPORT I, Anallely Rosell W, the attending physician, personally viewed and interpreted this ECG.   Date: 08/01/2015  EKG Time: 1639  Rate: 69  Rhythm: Atrial fibrillation without RVR  Axis: -24  Intervals: Normal  ST&T Change: None  noted   ____________________________________________    RADIOLOGY  Chest x-ray: IMPRESSION: Enlargement of cardiac silhouette.  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  No acute abnormalities.   CT head:  IMPRESSION: 1. No acute intracranial process. ____________________________________________   PROCEDURES    ____________________________________________   INITIAL IMPRESSION / ASSESSMENT AND PLAN / ED COURSE  Pertinent labs & imaging results that were available during my care of the patient were reviewed by me and considered in my medical decision making (see chart for details).  Well appearing, fit-appearing, 21 or a male in no acute distress. He has some elevation in his baseline blood pressures. I would not initiate any changes medications with the blood pressure levels he has recorded or we've seen here. His blood tests overall look good. A chest x-ray and a CT of the head both looked reasonable without any acute changes. His EKG does show atrial fibrillation. This is not new for the patient.  I counseled the patient to follow-up with his primary physician and his cardiologist.  ____________________________________________   FINAL CLINICAL IMPRESSION(S) / ED DIAGNOSES  Final diagnoses:  Essential hypertension      Ahmed Prima, MD 08/01/15 [facility]

## 2015-08-01 NOTE — ED Notes (Signed)
Spoke to Dr. Archie Balboa concerning pts plan of care, states to have a CT scan of his head but do not call code stroke

## 2015-08-01 NOTE — ED Notes (Addendum)
States he took his BP this AM and noticed it was high, states some SOB this AM and headache, states some blurry vision, started at 0900, states he woke up this AM at 0300 and could not sleep, states he was recently seen by a cardioligist and told he needs a pacemaker but pt can not remember why

## 2015-08-01 NOTE — Discharge Instructions (Signed)

## 2015-08-17 ENCOUNTER — Ambulatory Visit (INDEPENDENT_AMBULATORY_CARE_PROVIDER_SITE_OTHER): Payer: Medicare Other | Admitting: Cardiovascular Disease

## 2015-08-17 ENCOUNTER — Encounter: Payer: Self-pay | Admitting: Cardiovascular Disease

## 2015-08-17 VITALS — BP 122/70 | HR 76 | Ht 71.0 in | Wt 184.0 lb

## 2015-08-17 DIAGNOSIS — I10 Essential (primary) hypertension: Secondary | ICD-10-CM | POA: Diagnosis not present

## 2015-08-17 DIAGNOSIS — I2583 Coronary atherosclerosis due to lipid rich plaque: Principal | ICD-10-CM

## 2015-08-17 DIAGNOSIS — I2510 Atherosclerotic heart disease of native coronary artery without angina pectoris: Secondary | ICD-10-CM | POA: Diagnosis not present

## 2015-08-17 DIAGNOSIS — E785 Hyperlipidemia, unspecified: Secondary | ICD-10-CM

## 2015-08-17 DIAGNOSIS — I739 Peripheral vascular disease, unspecified: Secondary | ICD-10-CM

## 2015-08-17 NOTE — Assessment & Plan Note (Signed)
History of PAF on Pradaxa

## 2015-08-17 NOTE — Assessment & Plan Note (Signed)
History of hyperlipidemia on atorvastatin followed by his PCP 

## 2015-08-17 NOTE — Progress Notes (Signed)
08/17/2015 [name]   15-Nov-1940  [id number]  Primary Physician Lenard Simmer, MD Primary Cardiologist: Lorretta Harp MD [name]   HPI:  The patient is a 75 year old, thin and fit-appearing Caucasian male who I last saw in the office 11 months ago. He is a father of 2, grandfather to 2 grandchildren. I last saw him in the office 01/25/15. He has history of CAD status post stenting of his LAD, circumflex and marginal branch as well as his RCA in the past. His most recent cath performed by myself July 02, 2009, was remarkable for patent stents with an 80% in-stent restenosis within the obtuse marginal branch stent, as well as a 60% ostial RCA stenosis which I stented using a Taxus Liberte drug-eluting stent. His other problems include obstructive sleep apnea, hypertension and hyperlipidemia. 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 He has Barrett esophagus and gets periodic endoscopies and biopsies and because of this I have stopped his Coumadin in the past. When I saw him, he denied chest pain or shortness of breath but was complaining of occasional blurred vision. He had a Myoview stress test performed that was low risk and showed inferolateral scar. He had a 2D echo that revealed an EF of 45% to 50% similar to a prior echo. Carotid Dopplers were unremarkable. He did have an event monitor that showed evidence of PAF as well as nonsustained ventricular tachycardia with a 26-beat run of monomorphic VT during which he was asymptomatic.he is fairly active and works out 6 days a week. He denies chest pain or shortness of breath. He saw Dr. Stanford Breed in the office 11/16/14 for preoperative clearance before elective right inguinal hernia repair. This was performed without complication. No functional study was ordered prior to that. He has had some  atypical symptoms and underwent stress testing by his PCP that showed no evidence of ischemia. Did have PVCs however. He also complains of right calf claudication which is new for him.   Current Outpatient Prescriptions  Medication Sig Dispense Refill  . Acetaminophen (TYLENOL PO) Take 1 tablet by mouth every 6 (six) hours as needed (pain).    [name] aspirin 81 MG tablet Take 81 mg by mouth daily.    [name] atorvastatin (LIPITOR) 80 MG tablet Take 80 mg by mouth daily.    . calcium-vitamin D (OSCAL WITH D) 500-200 MG-UNIT per tablet Take 1 tablet by mouth 2 (two) times daily.     . carvedilol (COREG) 6.25 MG tablet Take 1 tablet (6.25 mg total) by mouth 2 (two) times daily. 180 tablet 3  . dabigatran (PRADAXA) 150 MG CAPS capsule Take 150 mg by mouth every 12 (twelve) hours.    [name] lisinopril (PRINIVIL,ZESTRIL) 20 MG tablet Take 10 mg by mouth daily.    . Multiple Vitamins-Minerals (MULTIVITAMIN WITH MINERALS) tablet Take 1 tablet by mouth daily.    . OMEGA 3-6-9 FATTY ACIDS PO Take 1 capsule by mouth once.    [name] omeprazole (PRILOSEC) 20 MG capsule Take 40 mg by mouth 2 (two) times daily.     [name] testosterone (ANDROGEL) 50 MG/5GM GEL Place 5 g onto the skin daily.    . vitamin B-12 (CYANOCOBALAMIN) 1000 MCG tablet Take 1,000 mcg by mouth daily.     No current facility-administered medications for this visit.    No Known Allergies  Social History   Social History  . Marital Status: Married    Spouse Name: N/A  . Number of Children: N/A  . Years of Education: N/A   Occupational History  . Not on file.   Social History Main Topics  . Smoking status: Former Smoker    Quit date: 12/20/1987  . Smokeless tobacco: Never Used  . Alcohol Use: Yes     Comment: 4 beers per day  . Drug Use: No  . Sexual Activity: Not on file   Other Topics Concern  . Not on file   Social History Narrative     Review of Systems: General: negative for chills, fever, night sweats or weight changes.    Cardiovascular: negative for chest pain, dyspnea on exertion, edema, orthopnea, palpitations, paroxysmal nocturnal dyspnea or shortness of breath Dermatological: negative for rash Respiratory: negative for cough or wheezing Urologic: negative for hematuria Abdominal: negative for nausea, vomiting, diarrhea, bright red blood per rectum, melena, or hematemesis Neurologic: negative for visual changes, syncope, or dizziness All other systems reviewed and are otherwise negative except as noted above.    Blood pressure 122/70, pulse 76, height 5\' 11"  (1.803 m), weight 184 lb (83.462 kg).  General appearance: alert and no distress Neck: no adenopathy, no carotid bruit, no JVD, supple, symmetrical, trachea midline and thyroid not enlarged, symmetric, no tenderness/mass/nodules Lungs: clear to auscultation bilaterally Heart: regular rate and rhythm, S1, S2 normal, no murmur, click, rub or gallop Extremities: extremities normal, atraumatic, no cyanosis or edema  EKG not performed today  ASSESSMENT AND PLAN:   CAD (coronary artery disease)- last PCI 12/10 History of CAD status post stenting of his LAD circumflex and marginal branches as well as his RCA in the past. His most recent cath performed by myself 12/88/10 revealed an 80% "in-stent restenosis within the obtuse marginal branch stent as well as a 60% ostial RCA/stenosis which was stented using a Taxus Liberte A drug-eluting stent. He had a recent stress test by his primary care physician that was nonischemic although he did have PVCs.  HTN (hypertension) History of hypertension blood pressure measured at 122/70. He is on carvedilol and  lisinopril. Continue current meds at current dosing  Paroxysmal atrial fibrillation History of PAF on Pradaxa  Hyperlipidemia History of hyperlipidemia on atorvastatin followed by his PCP      Lorretta Harp MD [facility], [facility] 08/17/2015 3:01 PM

## 2015-08-17 NOTE — Assessment & Plan Note (Signed)
History of CAD status post stenting of his LAD circumflex and marginal branches as well as his RCA in the past. His most recent cath performed by myself 12/88/10 revealed an 80% "in-stent restenosis within the obtuse marginal branch stent as well as a 60% ostial RCA/stenosis which was stented using a Taxus Liberte A drug-eluting stent. He had a recent stress test by his primary care physician that was nonischemic although he did have PVCs.

## 2015-08-17 NOTE — Assessment & Plan Note (Signed)
History of hypertension blood pressure measured at 122/70. He is on carvedilol and  lisinopril. Continue current meds at current dosing

## 2015-08-17 NOTE — Patient Instructions (Signed)
Medication Instructions:  Your physician recommends that you continue on your current medications as directed. Please refer to the Current Medication list given to you today.   Labwork: none  Testing/Procedures: Your physician has requested that you have a lower extremity arterial doppler- During this test, ultrasound is used to evaluate arterial blood flow in the legs. Allow approximately one hour for this exam.    Follow-Up: Your physician wants you to follow-up in: 12 months with Dr. Berry. You will receive a reminder letter in the mail two months in advance. If you don't receive a letter, please call our office to schedule the follow-up appointment.   Any Other Special Instructions Will Be Listed Below (If Applicable).     If you need a refill on your cardiac medications before your next appointment, please call your pharmacy.   

## 2015-08-18 ENCOUNTER — Other Ambulatory Visit: Payer: Self-pay | Admitting: Cardiovascular Disease

## 2015-08-18 DIAGNOSIS — I739 Peripheral vascular disease, unspecified: Secondary | ICD-10-CM

## 2015-08-30 ENCOUNTER — Encounter (HOSPITAL_COMMUNITY): Payer: Self-pay | Admitting: Cardiology

## 2015-08-30 ENCOUNTER — Ambulatory Visit (HOSPITAL_COMMUNITY)
Admission: RE | Admit: 2015-08-30 | Discharge: 2015-08-30 | Disposition: A | Discharge status: Home / Self Care | Payer: Medicare Other | Source: Ambulatory Visit | Attending: Cardiovascular Disease | Admitting: Cardiovascular Disease

## 2015-08-30 DIAGNOSIS — E785 Hyperlipidemia, unspecified: Secondary | ICD-10-CM | POA: Diagnosis not present

## 2015-08-30 DIAGNOSIS — I2510 Atherosclerotic heart disease of native coronary artery without angina pectoris: Secondary | ICD-10-CM | POA: Diagnosis not present

## 2015-08-30 DIAGNOSIS — Z87891 Personal history of nicotine dependence: Secondary | ICD-10-CM | POA: Diagnosis not present

## 2015-08-30 DIAGNOSIS — I10 Essential (primary) hypertension: Secondary | ICD-10-CM | POA: Diagnosis not present

## 2015-08-30 DIAGNOSIS — I70201 Unspecified atherosclerosis of native arteries of extremities, right leg: Secondary | ICD-10-CM | POA: Insufficient documentation

## 2015-08-30 DIAGNOSIS — I739 Peripheral vascular disease, unspecified: Secondary | ICD-10-CM

## 2015-09-01 ENCOUNTER — Telehealth: Payer: Self-pay

## 2015-09-01 ENCOUNTER — Ambulatory Visit: Payer: Medicare Other | Admitting: Cardiovascular Disease

## 2015-09-01 ENCOUNTER — Telehealth: Payer: Self-pay | Admitting: Cardiovascular Disease

## 2015-09-01 NOTE — Telephone Encounter (Signed)
Per pt call please give him a call back for his test Results.

## 2015-09-01 NOTE — Telephone Encounter (Signed)
-----   Message from Lorretta Harp, MD sent at 08/31/2015  8:17 PM EST ----- Return office visit with me to discuss results at next available

## 2015-09-01 NOTE — Telephone Encounter (Signed)
Left detailed message for pt to call back and setup appt at his earliest convience with Dr. Gwenlyn Found it is not something that can be discussed over the phone.

## 2015-09-01 NOTE — Telephone Encounter (Signed)
Pt wife calling to cancel appt- wondered if RN could call for test results today. Please call back and discuss.

## 2015-09-01 NOTE — Telephone Encounter (Signed)
Pt aware needs appt. Pt stated that he is at the [place] today and might be able to make appt later this afternoon but will call us back to schedule.

## 2015-09-01 NOTE — Telephone Encounter (Signed)
Pt called back and schedule f/u appt with Dr. Gwenlyn Found on 3/28 @ 8:45a

## 2015-09-23 ENCOUNTER — Encounter: Payer: Self-pay | Admitting: Cardiovascular Disease

## 2015-09-23 ENCOUNTER — Telehealth: Payer: Self-pay | Admitting: Cardiovascular Disease

## 2015-09-23 NOTE — Telephone Encounter (Signed)
Pt is calling back to speak with Maudie Mercury about results

## 2015-09-23 NOTE — Telephone Encounter (Signed)
Pt called back as yesterday called to see if he could make an earlier appt time.

## 2015-09-23 NOTE — Telephone Encounter (Signed)
This encounter was created in error - please disregard.

## 2015-09-23 NOTE — Telephone Encounter (Signed)
Pt is calling you back in regards to having a "conference call". Please f/u with her  Thanks

## 2015-10-04 ENCOUNTER — Encounter: Payer: Self-pay | Admitting: Cardiovascular Disease

## 2015-10-04 ENCOUNTER — Ambulatory Visit (INDEPENDENT_AMBULATORY_CARE_PROVIDER_SITE_OTHER): Payer: Medicare Other | Admitting: Cardiovascular Disease

## 2015-10-04 ENCOUNTER — Telehealth: Payer: Self-pay | Admitting: Cardiovascular Disease

## 2015-10-04 VITALS — BP 142/74 | HR 58 | Ht 71.0 in | Wt 181.9 lb

## 2015-10-04 DIAGNOSIS — I2510 Atherosclerotic heart disease of native coronary artery without angina pectoris: Secondary | ICD-10-CM

## 2015-10-04 DIAGNOSIS — I739 Peripheral vascular disease, unspecified: Secondary | ICD-10-CM | POA: Diagnosis not present

## 2015-10-04 NOTE — Telephone Encounter (Signed)
Returning call,he did not know who it was.

## 2015-10-04 NOTE — Patient Instructions (Signed)
Medication Instructions:  Your physician recommends that you continue on your current medications as directed. Please refer to the Current Medication list given to you today.   Labwork: none  Testing/Procedures: Your physician has requested that you have a lower extremity arterial doppler- During this test, ultrasound is used to evaluate arterial blood flow in the legs. Allow approximately one hour for this exam. SCHEDULE IN 6 MONTHS  Follow-Up: We request that you follow-up in: 6 months with Kerin Ransom, Pa and in 12 months with Dr Andria Rhein will receive a reminder letter in the mail two months in advance. If you don't receive a letter, please call our office to schedule the follow-up appointment.    Any Other Special Instructions Will Be Listed Below (If Applicable).     If you need a refill on your cardiac medications before your next appointment, please call your pharmacy.

## 2015-10-04 NOTE — Telephone Encounter (Signed)
Pt was called as there was opening in Dr. Gwenlyn Found schedule for today.  Pt contact and 3/28 appt moved to today 3/21 @ 3:45, pt verbalized understanding, no additional questions at this time.

## 2015-10-04 NOTE — Assessment & Plan Note (Signed)
[name] returns today for follow-up of his Doppler studies which were performed 08/30/15. This showed a right ABI 0.84 with moderate disease in his right common femoral artery. He does have a bruit there as well. His claudication resolved several weeks ago and has not recurred. At this point I feel comfortable following him noninvasively. We'll will repeat lower extremity until Doppler studies in 6 months.

## 2015-10-04 NOTE — Progress Notes (Signed)
[name] returns today for follow-up of his lower extremity arterial Doppler studies performed 08/30/15. This revealed revealed a normal left ABI with moderate decrease in the right ABI to 0.84. There is some mild to moderate disease in his right common femoral artery. There is a bruit on exam. Since I saw him 5-6 weeks ago his claudication has resolved. We will follow this noninvasively on outpatient basis.

## 2015-10-11 ENCOUNTER — Ambulatory Visit: Payer: Medicare Other | Admitting: Cardiovascular Disease

## 2016-04-10 ENCOUNTER — Ambulatory Visit (INDEPENDENT_AMBULATORY_CARE_PROVIDER_SITE_OTHER): Payer: Medicare Other | Admitting: Cardiology

## 2016-04-10 ENCOUNTER — Encounter: Payer: Self-pay | Admitting: Cardiology

## 2016-04-10 DIAGNOSIS — J439 Emphysema, unspecified: Secondary | ICD-10-CM | POA: Insufficient documentation

## 2016-04-10 DIAGNOSIS — I482 Chronic atrial fibrillation, unspecified: Secondary | ICD-10-CM

## 2016-04-10 DIAGNOSIS — J438 Other emphysema: Secondary | ICD-10-CM

## 2016-04-10 DIAGNOSIS — I10 Essential (primary) hypertension: Secondary | ICD-10-CM

## 2016-04-10 DIAGNOSIS — I2510 Atherosclerotic heart disease of native coronary artery without angina pectoris: Secondary | ICD-10-CM

## 2016-04-10 DIAGNOSIS — Z7901 Long term (current) use of anticoagulants: Secondary | ICD-10-CM

## 2016-04-10 DIAGNOSIS — G4733 Obstructive sleep apnea (adult) (pediatric): Secondary | ICD-10-CM

## 2016-04-10 DIAGNOSIS — E785 Hyperlipidemia, unspecified: Secondary | ICD-10-CM

## 2016-04-10 DIAGNOSIS — I739 Peripheral vascular disease, unspecified: Secondary | ICD-10-CM

## 2016-04-10 DIAGNOSIS — I255 Ischemic cardiomyopathy: Secondary | ICD-10-CM | POA: Diagnosis not present

## 2016-04-10 DIAGNOSIS — Z9861 Coronary angioplasty status: Secondary | ICD-10-CM

## 2016-04-10 NOTE — Assessment & Plan Note (Signed)
Last PCI 2010. Myoview 2014- low risk. No angina, exercises at least 30 minutes daily

## 2016-04-10 NOTE — Patient Instructions (Addendum)
Medication Instructions:  Your physician recommends that you continue on your current medications as directed. Please refer to the Current Medication list given to you today.  Labwork: None ordered  Testing/Procedures: None ordered  Follow-Up: Your physician wants you to follow-up in: 6 months with Dr Gwenlyn Found.  You will receive a reminder letter in the mail two months in advance. If you don't receive a letter, please call our office to schedule the follow-up appointment. RECALL ALREADY IN CHART  Any Other Special Instructions Will Be Listed Below (If Applicable).  NEEDS APPOINTMENT WITH DR Lovena Le IN 3-4 WEEKS FOR  DEFIBRILLATOR  (reason for office visit)  If you need a refill on your cardiac medications before your next appointment, please call your pharmacy.  NEXT APPOINTMENT  DATE:______________________________________  TIME:______________________________________AM/PM

## 2016-04-10 NOTE — Assessment & Plan Note (Signed)
Non compliant 

## 2016-04-10 NOTE — Assessment & Plan Note (Signed)
Rate controlled, asymptomatic 

## 2016-04-10 NOTE — Assessment & Plan Note (Signed)
Controlled.  

## 2016-04-10 NOTE — Assessment & Plan Note (Signed)
Pradaxa 

## 2016-04-10 NOTE — Assessment & Plan Note (Signed)
Last LDL on record was 71 in 2015. This has been followed by his PCP

## 2016-04-10 NOTE — Assessment & Plan Note (Signed)
EF now down to 30% by echo Aug 2017 at [facility]. No symptoms of CHF but [facility] has [facility]ibrill[facility].

## 2016-04-10 NOTE — Progress Notes (Signed)
04/10/2016 [name]   1940/09/23  [id number]  Primary Physician Lenard Simmer, MD Primary Cardiologist: Dr Gwenlyn Found  HPI:  75 y/o male followed by Dr Gwenlyn Found with a long history of CAD. His last PCI was 2010, Myoview low risk in 2014. Echo in 2014 showed his EF to be 45-50%. He has chronic atrial fibrillation with a tendency to slow VR but he has been asymptomatic with this. He has had documented NSVT on prior Holter monitor. He is in the office today for a 6 month check up. He brought in an echo that was done at there [place] in Aug 2017. It showed his EF had dropped to 30%. He tells me they recommended a defibrillator. He has a Holter from Dec 2016 that shows frequent PVCs and NSVT. He remains asymptomatic. He works out every day, treadmill, elliptical, and wgts. He has some DOE but I think that is from COPD. He denies any syncope or near syncope but admits to occasional palpitations.    Current Outpatient Prescriptions  Medication Sig Dispense Refill  . Acetaminophen (TYLENOL PO) Take 1 tablet by mouth every 6 (six) hours as needed (pain).    [name] aspirin 81 MG tablet Take 81 mg by mouth daily.    [name] atorvastatin (LIPITOR) 80 MG tablet Take 80 mg by mouth daily.    . calcium-vitamin D (OSCAL WITH D) 500-200 MG-UNIT per tablet Take 1 tablet by mouth 2 (two) times daily.     . carvedilol (COREG) 6.25 MG tablet Take 1 tablet (6.25 mg total) by mouth 2 (two) times daily. 180 tablet 3  . dabigatran (PRADAXA) 150 MG CAPS capsule Take 150 mg by mouth every 12 (twelve) hours.    [name] lisinopril (PRINIVIL,ZESTRIL) 20 MG tablet Take 10 mg by mouth daily.    . Multiple Vitamins-Minerals (MULTIVITAMIN WITH MINERALS) tablet Take 1 tablet by mouth daily.    . OMEGA 3-6-9 FATTY ACIDS PO Take 1 capsule by mouth once.    [name] omeprazole (PRILOSEC) 20 MG capsule Take 40 mg by mouth 2 (two) times daily.     [name] testosterone (ANDROGEL) 50 MG/5GM GEL Place 5 g onto the skin daily.    . vitamin B-12 (CYANOCOBALAMIN)  1000 MCG tablet Take 1,000 mcg by mouth daily.     No current facility-administered medications for this visit.     No Known Allergies  Social History   Social History  . Marital status: Married    Spouse name: N/A  . Number of children: N/A  . Years of education: N/A   Occupational History  . Not on file.   Social History Main Topics  . Smoking status: Former Smoker    Quit date: 12/20/1987  . Smokeless tobacco: Never Used  . Alcohol use Yes     Comment: 4 beers per day  . Drug use: No  . Sexual activity: Not on file   Other Topics Concern  . Not on file   Social History Narrative  . No narrative on file     Review of Systems: General: negative for chills, fever, night sweats or weight changes.  Cardiovascular: negative for chest pain, dyspnea on exertion, edema, orthopnea, palpitations, paroxysmal nocturnal dyspnea or shortness of breath Dermatological: negative for rash Respiratory: negative for cough or wheezing Urologic: negative for hematuria Abdominal: negative for nausea, vomiting, diarrhea, bright red blood per rectum, melena, or hematemesis Neurologic: negative for visual changes, syncope, or dizziness All other systems reviewed and are otherwise negative except  as noted above.    Blood pressure 140/76, pulse 67, height 5\' 11"  (1.803 m), weight 180 lb 9.6 oz (81.9 kg).  General appearance: alert, cooperative and no distress Neck: no carotid bruit and no JVD Lungs: clear to auscultation bilaterally Heart: irregularly irregular rhythm Abdomen: soft, non-tender; bowel sounds normal; no masses,  no organomegaly Extremities: no edema Skin: Skin color, texture, turgor normal. No rashes or lesions Neurologic: Grossly normal  EKG From May 2017- AF with PVCs  ASSESSMENT AND PLAN:   Cardiomyopathy, ischemic EF now down to 30% by echo Aug 2017 at [facility]. No symptoms of CHF but [facility].  HTN (hypertension) Controlled  Chronic  atrial fibrillation (HCC) Rate controlled, asymptomatic  Long term (current) use of anticoagulants Pradaxa  Obstructive sleep apnea Non compliant  Hyperlipidemia Last LDL on record was 71 in 2015. This has been followed by his PCP  CAD S/P multiple PCIs Last PCI 2010. Myoview 2014- low risk. 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 I can tell he is reluctant but he is agreeable to this plan.   Kerin Ransom PA-C 04/10/2016 8:27 AM

## 2016-04-10 NOTE — Assessment & Plan Note (Signed)
Evidence of asbestos exposure and emphysema on CXR 2017

## 2016-04-23 ENCOUNTER — Encounter: Payer: Self-pay | Admitting: Internal Medicine

## 2016-05-03 ENCOUNTER — Encounter: Payer: Self-pay | Admitting: Internal Medicine

## 2016-05-03 ENCOUNTER — Encounter: Payer: Self-pay | Admitting: *Deleted

## 2016-05-03 ENCOUNTER — Ambulatory Visit (INDEPENDENT_AMBULATORY_CARE_PROVIDER_SITE_OTHER): Payer: Medicare Other | Admitting: Internal Medicine

## 2016-05-03 VITALS — BP 136/78 | HR 66 | Ht 71.0 in | Wt 181.0 lb

## 2016-05-03 DIAGNOSIS — Z01812 Encounter for preprocedural laboratory examination: Secondary | ICD-10-CM

## 2016-05-03 DIAGNOSIS — I255 Ischemic cardiomyopathy: Secondary | ICD-10-CM | POA: Diagnosis not present

## 2016-05-03 DIAGNOSIS — I471 Supraventricular tachycardia: Secondary | ICD-10-CM

## 2016-05-03 NOTE — Progress Notes (Signed)
HPI [name] returns today for followup. He is a pleasant 75 yo man with an ICM, chronic systolic heart failure class 2A, EF 40% previously and now 30% since August, HTN and NSVT. In the interim, he has been found to have worsening LV dysfunction and dyspnea with exertion. No anginal symptoms. He has 12 stents in his heart althogether.  He has worn a cardiac monitor and i have reviewed it in detail and this demonstrates atrial fib and NSVT. He has not had syncope.  No Known Allergies   Current Outpatient Prescriptions  Medication Sig Dispense Refill  . Acetaminophen (TYLENOL PO) Take 1 tablet by mouth every 6 (six) hours as needed (pain). Unsure of strength    . aspirin 81 MG tablet Take 81 mg by mouth daily.    [name] atorvastatin (LIPITOR) 80 MG tablet Take 80 mg by mouth daily.    . calcium-vitamin D (OSCAL WITH D) 500-200 MG-UNIT per tablet Take 1 tablet by mouth 2 (two) times daily.     . carvedilol (COREG) 6.25 MG tablet Take 1 tablet (6.25 mg total) by mouth 2 (two) times daily. 180 tablet 3  . dabigatran (PRADAXA) 150 MG CAPS capsule Take 150 mg by mouth every 12 (twelve) hours.    [name] lisinopril (PRINIVIL,ZESTRIL) 20 MG tablet Take 10 mg by mouth daily.    . Multiple Vitamins-Minerals (MULTIVITAMIN WITH MINERALS) tablet Take 1 tablet by mouth daily.    . OMEGA 3-6-9 FATTY ACIDS PO Take 1 capsule by mouth 3 (three) times daily.     [name] omeprazole (PRILOSEC) 20 MG capsule Take 40 mg by mouth 2 (two) times daily.     [name] testosterone (ANDROGEL) 50 MG/5GM GEL Place 5 g onto the skin daily.     No current facility-administered medications for this visit.      Past Medical History:  Diagnosis Date  . A-fib ([place])   . Arthritis   . Barrett's esophagus    Gets periodic endoscopies & biopsies. Coumadin has been held in the past for these precedures.   . Blurred vision   . CAD (coronary artery disease)    S/P stenting of his LAD, circumflex & marginal branch as well as his RCA in 2008.    . 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 Cardiomyopathy ([place])    Significant ischemic cardiomyopathy. ECHO 08/27/12: EF = 45-50% (Previous Echo revealed EF = 25%)  . Carotid artery occlusion    Carotid Doppler 08/27/12 SUMMARY - Bilateral ICAs: Demonstrated normal patency without evidence of a significant diamenter reduction, tortuosity or any other vascular abnormality. This was a Normal carotid duplex Doppler Evaluation.  . Cataract   . Cerebral atherosclerosis    Carotid Doppler 08/27/12 SUMMARY - Bilateral ICAs: Demonstrated normal patency without evidence of a significant diamenter reduction, tortuosity or any other vascular abnormality. This was a Normal carotid duplex Doppler Evaluation.  . CHF (congestive heart failure) ([place])   . Chronic anticoagulation    For PE & PAF  . Claudication ([place])    right ABI of 0.84  . Coronary artery disease    Most recent cath by Dr. Gwenlyn Found 07/02/09 was remarkable for patent stents w/an 80% in-stent stenosis within the obtuse marginal branch stent, as well as 60% ostial RCA stenosis which was stented with a Taxus Liberte drug-eluting stent. Nuc stress test 09/03/12 was low risk & showed an inferolateral scar. & an EF of 39%  . DVT (deep venous thrombosis) ([place])   .  GERD (gastroesophageal reflux disease)   . H/O mitral valve disorder   . Headache   . Hyperlipidemia   . Hypertension   . Meningioma ([place]) 08/15/12   BrainLAB-guided left frontoparietal craniotomy w/removal of a meningioma.   . Obstructive sleep apnea    Sleep Study in 2011 AHI 3.2. 100% compliance with CPAP.  [name] Paroxysmal atrial fibrillation (HCC)    On Coumadin  . PSVT (paroxysmal supraventricular tachycardia) ([place])    2D ECHO 08/27/12: EF = 45-50% (Previous Echo revealed EF = 25%)  . Pulmonary embolism (HCC)    On Coumadin  . Shortness of breath dyspnea   . Sick sinus syndrome ([place])   . TIA (transient ischemic attack)     ROS:   All systems reviewed and negative except as noted in the  HPI.   Past Surgical History:  Procedure Laterality Date  . CARDIAC CATHETERIZATION     Past caths in 2008, 2006, 2005 & 2003  . CATARACT EXTRACTION, BILATERAL Bilateral   . CORONARY ANGIOPLASTY WITH STENT PLACEMENT  07/02/09   Most recent cath by Dr. Gwenlyn Found 07/02/09 was remarkable for patent stents w/an 80% in-stent stenosis within the obtuse marginal branch stent, as well as 60% ostial RCA stenosis which was stented with a Taxus Liberte drug-eluting stent.   [name] CRANIOTOMY  08/15/12   At [facility]. BrainLAB-guided left frontoparietal craniotomy w/removal of a meningioma.   . INGUINAL HERNIA REPAIR Right 11/23/2014   Procedure: HERNIA REPAIR INGUINAL ADULT;  Surgeon: Rochel Brome, MD;  Location: [facility] ORS;  Service: General;  Laterality: Right;  . TOTAL KNEE ARTHROPLASTY Left 09/2010   Surgery was complicated by PAF with a pulmonary embolism documented by CT angiogram.     Family History  Problem Relation Age of Onset  . Cancer Mother   . Cardiomyopathy Sister   . Cancer Other     Breast     Social History   Social History  . Marital status: Married    Spouse name: N/A  . Number of children: N/A  . Years of education: N/A   Occupational History  . Not on file.   Social History Main Topics  . Smoking status: Former Smoker    Quit date: 12/20/1987  . Smokeless tobacco: Never Used  . Alcohol use Yes     Comment: 4 beers per day  . Drug use: No  . Sexual activity: Not on file   Other Topics Concern  . Not on file   Social History Narrative  . No narrative on file     BP 136/78   Pulse 66   Ht 5\' 11"  (1.803 m)   Wt 181 lb (82.1 kg)   BMI 25.24 kg/m   Physical Exam:  Well appearing 75 yo man, NAD HEENT: Unremarkable Neck:  6 cm JVD, no thyromegally Back:  No CVA tenderness Lungs:  Clear with no wheezes HEART:  IRegular rate rhythm, no murmurs, no rubs, no clicks Abd:  soft, positive bowel sounds, no organomegally, no rebound, no guarding Ext:  2 plus pulses, no  edema, no cyanosis, no clubbing Skin:  No rashes no nodules Neuro:  CN II through XII intact, motor grossly intact  EKG -atrial fib with a controlled VR   Assess/Plan: 1. Ischemic CM - he has developed worsening LV dysfunction. He has an indication for ICD implantation. He is on maximal medical therapy. I have discussed the risks/benefits/goals/expectations of ICD implant with the patient and he wishes to proceed. 2. Chronic systolic heart  failure - his symptoms are class 2. Will follow. 3. Atrial fib - his ventricular rate is well controlled. He will hold his Pradaxa when he has his ICD implanted for 24 hours.  4. HTN - his blood pressure is mostly well controlled. Will follow.  [name].D.

## 2016-05-03 NOTE — Patient Instructions (Signed)
Your physician recommends that you continue on your current medications as directed. Please refer to the Current Medication list given to you today.   Your physician recommends that you schedule a follow-up appointment in:   [place] AFTER  05-21-16 PROCEDURE  Your physician recommends that you return for lab work in:  05-11-16   CBC  BMET INR

## 2016-05-04 ENCOUNTER — Telehealth: Payer: Self-pay | Admitting: Internal Medicine

## 2016-05-04 NOTE — Telephone Encounter (Signed)
Called, spoke with pt. Pt would like explanation as to why could not get a ICD/PPM placed instead of just ICD. Informed I would have to forward question to Dr. Lovena Le to advise, as I was not working with Dr. Lovena Le yesterday. Informed I will contact pt back on Tuesday or Wednesday of next week. Pt verbalized understanding and agreed with plan.

## 2016-05-04 NOTE — Telephone Encounter (Signed)
New message      Pt is due to get a defibulator in nov.  He has questions.  Please call

## 2016-05-11 ENCOUNTER — Other Ambulatory Visit: Payer: Medicare Other | Admitting: *Deleted

## 2016-05-11 DIAGNOSIS — Z01812 Encounter for preprocedural laboratory examination: Secondary | ICD-10-CM

## 2016-05-11 LAB — CBC WITH DIFFERENTIAL/PLATELET
BASOS PCT: 0 %
Basophils Absolute: 0 cells/uL (ref 0–200)
EOS ABS: 48 {cells}/uL (ref 15–500)
EOS PCT: 1 %
HCT: 41.3 % (ref 38.5–50.0)
Hemoglobin: 13.9 g/dL (ref 13.2–17.1)
LYMPHS PCT: 32 %
Lymphs Abs: 1536 cells/uL (ref 850–3900)
MCH: 34.1 pg — ABNORMAL HIGH (ref 27.0–33.0)
MCHC: 33.7 g/dL (ref 32.0–36.0)
MCV: 101.2 fL — ABNORMAL HIGH (ref 80.0–100.0)
MONOS PCT: 11 %
MPV: 10.3 fL (ref 7.5–12.5)
Monocytes Absolute: 528 cells/uL (ref 200–950)
NEUTROS ABS: 2688 {cells}/uL (ref 1500–7800)
Neutrophils Relative %: 56 %
PLATELETS: 159 10*3/uL (ref 140–400)
RBC: 4.08 MIL/uL — AB (ref 4.20–5.80)
RDW: 14.3 % (ref 11.0–15.0)
WBC: 4.8 10*3/uL (ref 3.8–10.8)

## 2016-05-11 LAB — BASIC METABOLIC PANEL
BUN: 15 mg/dL (ref 7–25)
CHLORIDE: 105 mmol/L (ref 98–110)
CO2: 30 mmol/L (ref 20–31)
Calcium: 9 mg/dL (ref 8.6–10.3)
Creat: 0.77 mg/dL (ref 0.70–1.18)
Glucose, Bld: 99 mg/dL (ref 65–99)
Potassium: 4.2 mmol/L (ref 3.5–5.3)
SODIUM: 142 mmol/L (ref 135–146)

## 2016-05-11 LAB — PROTIME-INR
INR: 1.2 — AB
Prothrombin Time: 12.6 s — ABNORMAL HIGH (ref 9.0–11.5)

## 2016-05-14 NOTE — Telephone Encounter (Signed)
He will get a combination ICD/Pacemaker. GT

## 2016-05-16 NOTE — Telephone Encounter (Signed)
Called, spoke with pt. Informed the device being placed will be a combo ICD/PPM. Pt verbalized understanding and thanked me for calling.

## 2016-05-21 ENCOUNTER — Encounter (HOSPITAL_COMMUNITY)
Admission: RE | Disposition: A | Discharge status: Home / Self Care | Payer: Self-pay | Source: Ambulatory Visit | Attending: Internal Medicine

## 2016-05-21 ENCOUNTER — Encounter (HOSPITAL_COMMUNITY): Payer: Self-pay | Admitting: Internal Medicine

## 2016-05-21 ENCOUNTER — Observation Stay (HOSPITAL_COMMUNITY)
Admission: RE | Admit: 2016-05-21 | Discharge: 2016-05-22 | Disposition: A | Discharge status: Home / Self Care | Payer: Medicare Other | Source: Ambulatory Visit | Attending: Internal Medicine | Admitting: Internal Medicine

## 2016-05-21 DIAGNOSIS — I252 Old myocardial infarction: Secondary | ICD-10-CM | POA: Insufficient documentation

## 2016-05-21 DIAGNOSIS — I255 Ischemic cardiomyopathy: Principal | ICD-10-CM | POA: Insufficient documentation

## 2016-05-21 DIAGNOSIS — Z79899 Other long term (current) drug therapy: Secondary | ICD-10-CM | POA: Insufficient documentation

## 2016-05-21 DIAGNOSIS — Z112 Encounter for screening for other bacterial diseases: Secondary | ICD-10-CM | POA: Insufficient documentation

## 2016-05-21 DIAGNOSIS — I482 Chronic atrial fibrillation: Secondary | ICD-10-CM | POA: Diagnosis not present

## 2016-05-21 DIAGNOSIS — I110 Hypertensive heart disease with heart failure: Secondary | ICD-10-CM | POA: Diagnosis not present

## 2016-05-21 DIAGNOSIS — I472 Ventricular tachycardia: Secondary | ICD-10-CM | POA: Insufficient documentation

## 2016-05-21 DIAGNOSIS — Z7901 Long term (current) use of anticoagulants: Secondary | ICD-10-CM | POA: Diagnosis not present

## 2016-05-21 DIAGNOSIS — I509 Heart failure, unspecified: Secondary | ICD-10-CM | POA: Insufficient documentation

## 2016-05-21 DIAGNOSIS — Z006 Encounter for examination for normal comparison and control in clinical research program: Secondary | ICD-10-CM | POA: Diagnosis not present

## 2016-05-21 DIAGNOSIS — Z95810 Presence of automatic (implantable) cardiac defibrillator: Secondary | ICD-10-CM

## 2016-05-21 DIAGNOSIS — I2510 Atherosclerotic heart disease of native coronary artery without angina pectoris: Secondary | ICD-10-CM | POA: Diagnosis not present

## 2016-05-21 HISTORY — DX: Basal cell carcinoma of skin, unspecified: C44.91

## 2016-05-21 HISTORY — DX: Headache: R51

## 2016-05-21 HISTORY — DX: Presence of automatic (implantable) cardiac defibrillator: Z95.810

## 2016-05-21 HISTORY — DX: Headache, unspecified: R51.9

## 2016-05-21 HISTORY — DX: Personal history of peptic ulcer disease: Z87.11

## 2016-05-21 HISTORY — DX: Personal history of other diseases of the digestive system: Z87.19

## 2016-05-21 HISTORY — DX: Personal history of urinary calculi: Z87.442

## 2016-05-21 HISTORY — PX: EP IMPLANTABLE DEVICE: SHX172B

## 2016-05-21 LAB — SURGICAL PCR SCREEN
MRSA, PCR: NEGATIVE
Staphylococcus aureus: NEGATIVE

## 2016-05-21 SURGERY — ICD IMPLANT

## 2016-05-21 MED ORDER — ACETAMINOPHEN 325 MG PO TABS
325.0000 mg | ORAL_TABLET | ORAL | Status: DC | PRN
  Administered 2016-05-22: 325 mg via ORAL
  Filled 2016-05-21: qty 2

## 2016-05-21 MED ORDER — ATORVASTATIN CALCIUM 80 MG PO TABS
80.0000 mg | ORAL_TABLET | Freq: Every evening | ORAL | Status: DC
  Administered 2016-05-21: 80 mg via ORAL
  Filled 2016-05-21: qty 1

## 2016-05-21 MED ORDER — POLYVINYL ALCOHOL 1.4 % OP SOLN
1.0000 [drp] | Freq: Every day | OPHTHALMIC | Status: DC
  Administered 2016-05-22: 1 [drp] via OPHTHALMIC
  Filled 2016-05-21: qty 15

## 2016-05-21 MED ORDER — FENTANYL CITRATE (PF) 100 MCG/2ML IJ SOLN
INTRAMUSCULAR | Status: DC | PRN
  Administered 2016-05-21 (×3): 12.5 ug via INTRAVENOUS
  Administered 2016-05-21: 25 ug via INTRAVENOUS
  Administered 2016-05-21 (×2): 12.5 ug via INTRAVENOUS

## 2016-05-21 MED ORDER — CHLORHEXIDINE GLUCONATE 4 % EX LIQD
60.0000 mL | Freq: Once | CUTANEOUS | Status: DC
Start: 2016-05-21 — End: 2016-05-21
  Filled 2016-05-21: qty 60

## 2016-05-21 MED ORDER — CEFAZOLIN SODIUM-DEXTROSE 2-4 GM/100ML-% IV SOLN
INTRAVENOUS | Status: AC
  Filled 2016-05-21: qty 100

## 2016-05-21 MED ORDER — CEFAZOLIN SODIUM-DEXTROSE 2-4 GM/100ML-% IV SOLN
2.0000 g | INTRAVENOUS | Status: AC
  Administered 2016-05-21: 2 g via INTRAVENOUS
  Filled 2016-05-21: qty 100

## 2016-05-21 MED ORDER — HEPARIN (PORCINE) IN NACL 2-0.9 UNIT/ML-% IJ SOLN
INTRAMUSCULAR | Status: AC
  Filled 2016-05-21: qty 500

## 2016-05-21 MED ORDER — FENTANYL CITRATE (PF) 100 MCG/2ML IJ SOLN
INTRAMUSCULAR | Status: AC
  Filled 2016-05-21: qty 2

## 2016-05-21 MED ORDER — LIDOCAINE HCL (PF) 1 % IJ SOLN
INTRAMUSCULAR | Status: AC
  Filled 2016-05-21: qty 60

## 2016-05-21 MED ORDER — MIDAZOLAM HCL 5 MG/5ML IJ SOLN
INTRAMUSCULAR | Status: AC
  Filled 2016-05-21: qty 5

## 2016-05-21 MED ORDER — CALCIUM CARBONATE-VITAMIN D 500-200 MG-UNIT PO TABS
1.0000 | ORAL_TABLET | Freq: Two times a day (BID) | ORAL | Status: DC
  Administered 2016-05-21 – 2016-05-22 (×2): 1 via ORAL
  Filled 2016-05-21 (×2): qty 1

## 2016-05-21 MED ORDER — ASPIRIN EC 81 MG PO TBEC
81.0000 mg | DELAYED_RELEASE_TABLET | Freq: Every day | ORAL | Status: DC
  Administered 2016-05-21: 81 mg via ORAL
  Filled 2016-05-21: qty 1

## 2016-05-21 MED ORDER — ASPIRIN EC 81 MG PO TBEC: 81.0000 mg | DELAYED_RELEASE_TABLET | Freq: Every day | ORAL | Status: DC

## 2016-05-21 MED ORDER — SODIUM CHLORIDE 0.9 % IR SOLN
80.0000 mg | Status: AC
  Administered 2016-05-21: 80 mg
  Filled 2016-05-21: qty 2

## 2016-05-21 MED ORDER — MIDAZOLAM HCL 5 MG/5ML IJ SOLN
INTRAMUSCULAR | Status: DC | PRN
  Administered 2016-05-21: 1 mg via INTRAVENOUS
  Administered 2016-05-21: 2 mg via INTRAVENOUS
  Administered 2016-05-21 (×5): 1 mg via INTRAVENOUS

## 2016-05-21 MED ORDER — SODIUM CHLORIDE 0.9 % IV SOLN
INTRAVENOUS | Status: DC
  Administered 2016-05-21: 08:00:00 via INTRAVENOUS

## 2016-05-21 MED ORDER — CEFAZOLIN IN D5W 1 GM/50ML IV SOLN
1.0000 g | Freq: Four times a day (QID) | INTRAVENOUS | Status: AC
  Administered 2016-05-21 – 2016-05-22 (×3): 1 g via INTRAVENOUS
  Filled 2016-05-21 (×3): qty 50

## 2016-05-21 MED ORDER — HEPARIN (PORCINE) IN NACL 2-0.9 UNIT/ML-% IJ SOLN
INTRAMUSCULAR | Status: DC | PRN
  Administered 2016-05-21: 10:00:00

## 2016-05-21 MED ORDER — PANTOPRAZOLE SODIUM 40 MG PO TBEC
40.0000 mg | DELAYED_RELEASE_TABLET | Freq: Every day | ORAL | Status: DC
  Administered 2016-05-21 – 2016-05-22 (×2): 40 mg via ORAL
  Filled 2016-05-21 (×2): qty 1

## 2016-05-21 MED ORDER — LIDOCAINE HCL (PF) 1 % IJ SOLN
INTRAMUSCULAR | Status: DC | PRN
Start: 2016-05-21 — End: 2016-05-21
  Administered 2016-05-21: 40 mL via INTRADERMAL

## 2016-05-21 MED ORDER — ONDANSETRON HCL 4 MG/2ML IJ SOLN: 4.0000 mg | Freq: Four times a day (QID) | INTRAMUSCULAR | Status: DC | PRN

## 2016-05-21 MED ORDER — SODIUM CHLORIDE 0.9 % IR SOLN
Status: AC
  Filled 2016-05-21: qty 2

## 2016-05-21 MED ORDER — ATORVASTATIN CALCIUM 80 MG PO TABS: 80.0000 mg | ORAL_TABLET | Freq: Every evening | ORAL | Status: DC

## 2016-05-21 MED ORDER — LISINOPRIL 10 MG PO TABS: 10.0000 mg | ORAL_TABLET | Freq: Every evening | ORAL | Status: DC

## 2016-05-21 MED ORDER — ACETAMINOPHEN 325 MG PO TABS
650.0000 mg | ORAL_TABLET | Freq: Four times a day (QID) | ORAL | Status: DC | PRN
  Administered 2016-05-21 (×2): 650 mg via ORAL
  Filled 2016-05-21 (×2): qty 2

## 2016-05-21 MED ORDER — MUPIROCIN 2 % EX OINT
TOPICAL_OINTMENT | CUTANEOUS | Status: AC
  Administered 2016-05-21: 1 via TOPICAL
  Filled 2016-05-21: qty 22

## 2016-05-21 MED ORDER — MUPIROCIN 2 % EX OINT
1.0000 "application " | TOPICAL_OINTMENT | Freq: Once | CUTANEOUS | Status: AC
  Administered 2016-05-21: 1 via TOPICAL
  Filled 2016-05-21: qty 22

## 2016-05-21 MED ORDER — CARVEDILOL 6.25 MG PO TABS
6.2500 mg | ORAL_TABLET | Freq: Two times a day (BID) | ORAL | Status: DC
  Administered 2016-05-21 – 2016-05-22 (×2): 6.25 mg via ORAL
  Filled 2016-05-21 (×2): qty 1

## 2016-05-21 MED ORDER — ADULT MULTIVITAMIN W/MINERALS CH
1.0000 | ORAL_TABLET | Freq: Every day | ORAL | Status: DC
Start: 2016-05-22 — End: 2016-05-22
  Administered 2016-05-22: 1 via ORAL
  Filled 2016-05-21: qty 1

## 2016-05-21 MED ORDER — LISINOPRIL 10 MG PO TABS
10.0000 mg | ORAL_TABLET | Freq: Every evening | ORAL | Status: DC
  Administered 2016-05-21: 10 mg via ORAL
  Filled 2016-05-21: qty 1

## 2016-05-21 SURGICAL SUPPLY — 9 items
CABLE SURGICAL S-101-97-12 (CABLE) ×3 IMPLANT
ICD DYNAGEN EL DR DF4 D152 (ICD Generator) ×3 IMPLANT
INGEVITY MRI 7741-52CM (Lead) ×3 IMPLANT
LEAD PACING INGEVITY MRI 52CM (Lead) ×1 IMPLANT
LEAD RELIANCE G DF4 0293 (Lead) ×3 IMPLANT
PAD DEFIB LIFELINK (PAD) ×3 IMPLANT
SHEATH CLASSIC 7F (SHEATH) ×3 IMPLANT
SHEATH CLASSIC 9F (SHEATH) ×3 IMPLANT
TRAY PACEMAKER INSERTION (PACKS) ×3 IMPLANT

## 2016-05-21 NOTE — H&P (Signed)
ICD Criteria  Current LVEF:30%. Within 12 months prior to implant: Yes   Heart failure history: Yes, Class II  Cardiomyopathy history: Yes, Ischemic Cardiomyopathy - Prior MI.  Atrial Fibrillation/Atrial Flutter: Yes, Persistent (> 7 Days).  Ventricular tachycardia history: No.  Cardiac arrest history: No.  History of syndromes with risk of sudden death: No.  Previous ICD: No.  Current ICD indication: Primary  PPM indication: No.   Class I or II Bradycardia indication present: No  Beta Blocker therapy for 3 or more months: Yes, prescribed.   Ace Inhibitor/ARB therapy for 3 or more months: Yes, prescribed.

## 2016-05-21 NOTE — Discharge Instructions (Signed)
° ° °  Supplemental Discharge Instructions for  Pacemaker/Defibrillator Patients  Activity No heavy lifting or vigorous activity with your left/right arm for 6 to 8 weeks.  Do not raise your left/right arm above your head for one week.  Gradually raise your affected arm as drawn below.             05/25/16                   05/26/16                  05/27/16                05/28/16     __  NO DRIVING for  1 week   ; you may begin driving on [id number]    .  WOUND CARE - Keep the wound area clean and dry.  Do not get this area wet for one week. No showers for one week; you may shower on  05/28/16   . - The tape/steri-strips on your wound will fall off; do not pull them off.  No bandage is needed on the site.  DO  NOT apply any creams, oils, or ointments to the wound area. - If you notice any drainage or discharge from the wound, any swelling or bruising at the site, or you develop a fever > 101? F after you are discharged home, call the office at once.  Special Instructions - You are still able to use cellular telephones; use the ear opposite the side where you have your pacemaker/defibrillator.  Avoid carrying your cellular phone near your device. - When traveling through airports, show security personnel your identification card to avoid being screened in the metal detectors.  Ask the security personnel to use the hand wand. - Avoid arc welding equipment, MRI testing (magnetic resonance imaging), TENS units (transcutaneous nerve stimulators).  Call the office for questions about other devices. - Avoid electrical appliances that are in poor condition or are not properly grounded. - Microwave ovens are safe to be near or to operate.  Additional information for defibrillator patients should your device go off: - If your device goes off ONCE and you feel fine afterward, notify the device clinic nurses. - If your device goes off ONCE and you do not feel well afterward, call 911. - If your device  goes off TWICE, call 911. - If your device goes off THREE times in one day, call 911.  DO NOT DRIVE YOURSELF OR A FAMILY MEMBER WITH A DEFIBRILLATOR TO THE HOSPITAL--CALL 911.

## 2016-05-21 NOTE — Discharge Summary (Signed)
ELECTROPHYSIOLOGY PROCEDURE DISCHARGE SUMMARY    Patient ID: [name],  MRN: [id number], DOB/AGE: 77-17-1942 77 y.o.  Admit date: 05/21/2016 Discharge date: 05/22/16  Primary Care Physician: Lenard Simmer, MD Primary Cardiologist: Dr. Gwenlyn Found Electrophysiologist: Dr. Lovena Le  Primary Discharge Diagnosis:  1. ICM 2 NSVT  Secondary Discharge Diagnosis:  1. Permanent AFib     CHA2DS2Vasc is at least 4, on Pradaxa 2. CAD 3. HTN  No Known Allergies   Procedures This Admission:  1.  Implantation of a BSCi dual chamber ICD on 05/21/16 by Dr. Lovena Le.  The patient received a [employer] (serial Number [id number]) ICD, [employer] (serial # [id number] ) right atrial lead and a [employer] (serial number [id number]) right ventricular defibrillator lead.  DFT's were deferred at time of implant.  There were no immediate post procedure complications. 2.  CXR on 05/22/16 demonstrated no pneumothorax status post device implantation.   Brief HPI: [name] is a 75 y.o. male was referred to electrophysiology in the outpatient setting for consideration of ICD implantation.  Past medical history includes ICM, CAD, HTN, NSVT.  The patient has persistent LV dysfunction despite guideline directed therapy.  Risks, benefits, and alternatives to ICD implantation were reviewed with the patient who wished to proceed.   Hospital Course:  The patient was admitted and underwent implantation of an ICD with details as outlined above. He was monitored on telemetry overnight which demonstrated AFib, occ PVC's/couplets, rare 3 beat NSVT.  Left chest was without hematoma or ecchymosis.  The device was interrogated and found to be functioning normally.  CXR was obtained and demonstrated no pneumothorax status post device implantation.  Wound care, arm mobility, and restrictions were reviewed with the patient.  The patient was examined by Dr. Lovena Le and considered stable for discharge to home. To resume his Pradaxa  tomorrow.  The patient's discharge medications include an ACE-I (lisinopril) and beta blocker (carvedilol).   Physical Exam: Vitals:   05/21/16 1616 05/21/16 1942 05/22/16 0024 05/22/16 0500  BP: 135/72 (!) 155/96 (!) 147/79 (!) 146/92  Pulse: 63 61 79 68  Resp:   19 20  Temp:  98.3 F (36.8 C) 98.3 F (36.8 C) 97.5 F (36.4 C)  TempSrc:  Oral    SpO2: 96% 97% 96% 95%  Weight:    177 lb 11.2 oz (80.6 kg)  Height:        GEN- The patient is well appearing, alert and oriented x 3 today.   HEENT: normocephalic, atraumatic; sclera clear, conjunctiva pink; hearing intact; oropharynx clear Lungs- Clear to ausculation bilaterally, normal work of breathing.  No wheezes, rales, rhonchi Heart- IRRR, no murmurs, rubs or gallops, PMI not laterally displaced GI- soft, non-tender, non-distended Extremities- no clubbing, cyanosis, or edema; MS- no significant deformity or atrophy Skin- warm and dry, no rash or lesion, left chest without hematoma/ecchymosis Psych- euthymic mood, full affect Neuro- no gross defecits  Labs:   Lab Results  Component Value Date   WBC 4.8 05/11/2016   HGB 13.9 05/11/2016   HCT 41.3 05/11/2016   MCV 101.2 (H) 05/11/2016   PLT 159 05/11/2016   No results for input(s): NA, K, CL, CO2, BUN, CREATININE, CALCIUM, PROT, BILITOT, ALKPHOS, ALT, AST, GLUCOSE in the last 168 hours.  Invalid input(s): LABALBU  Discharge Medications:    Medication List    TAKE these medications   acetaminophen 325 MG tablet Commonly known as:  TYLENOL Take 650 mg by mouth every 6 (six) hours  as needed (for pain.).   aspirin EC 81 MG tablet Take 81 mg by mouth daily.   atorvastatin 80 MG tablet Commonly known as:  LIPITOR Take 80 mg by mouth every evening.   calcium-vitamin D 500-200 MG-UNIT tablet Commonly known as:  OSCAL WITH D Take 1 tablet by mouth 2 (two) times daily.   carvedilol 6.25 MG tablet Commonly known as:  COREG Take 1 tablet (6.25 mg total) by mouth 2  (two) times daily.   dabigatran 150 MG Caps capsule Commonly known as:  PRADAXA Take 150 mg by mouth every 12 (twelve) hours.   L-LYSINE PO Take 1 capsule by mouth daily.   lisinopril 20 MG tablet Commonly known as:  PRINIVIL,ZESTRIL Take 10 mg by mouth every evening.   multivitamin with minerals tablet Take 1 tablet by mouth daily.   OMEGA 3-6-9 FATTY ACIDS PO Take 1 capsule by mouth 3 (three) times daily.   omeprazole 20 MG capsule Commonly known as:  PRILOSEC Take 40 mg by mouth 2 (two) times daily.   polyvinyl alcohol 1.4 % ophthalmic solution Commonly known as:  LIQUIFILM TEARS Place 1 drop into both eyes daily.       Disposition:  Home Discharge Instructions    Diet - low sodium heart healthy    Complete by:  As directed    Increase activity slowly    Complete by:  As directed      Follow-up Information    [facility] Follow up on 05/28/2016.   Specialty:  Cardiology Why:  2:00PM, wound check Contact information: [address], [place]       Cristopher Peru, MD Follow up on 08/24/2016.   Specialty:  Cardiology Why:  9:45AM Contact information: [address] [address] [phone]           Duration of Discharge Encounter: Greater than 30 minutes including physician time.  Venetia Night, PA-C 05/22/2016 8:59 AM  EP Attending  Patient seen and examined. Agree with above. He is stable for DC home. Usual followup. His device is functioning normally.   Mikle Bosworth.D.

## 2016-05-22 ENCOUNTER — Observation Stay (HOSPITAL_COMMUNITY): Payer: Medicare Other

## 2016-05-22 DIAGNOSIS — I255 Ischemic cardiomyopathy: Secondary | ICD-10-CM | POA: Diagnosis not present

## 2016-05-22 DIAGNOSIS — I2510 Atherosclerotic heart disease of native coronary artery without angina pectoris: Secondary | ICD-10-CM | POA: Diagnosis not present

## 2016-05-22 DIAGNOSIS — I482 Chronic atrial fibrillation: Secondary | ICD-10-CM | POA: Diagnosis not present

## 2016-05-22 DIAGNOSIS — I472 Ventricular tachycardia: Secondary | ICD-10-CM | POA: Diagnosis not present

## 2016-05-22 NOTE — Progress Notes (Signed)
Reviewed discharge instructions with patient and wife and they stated their understanding.  Stressed the importance of calling 911 if ICD fired and not driving to hospital himself.  Discharged home with wife via wheelchair. Sanda Linger

## 2016-05-28 ENCOUNTER — Ambulatory Visit (INDEPENDENT_AMBULATORY_CARE_PROVIDER_SITE_OTHER): Payer: Medicare Other | Admitting: *Deleted

## 2016-05-28 DIAGNOSIS — I255 Ischemic cardiomyopathy: Secondary | ICD-10-CM

## 2016-05-28 NOTE — Progress Notes (Signed)
Wound check appointment. Steri-strips removed. Wound without redness or edema. Incision edges approximated, wound well healed. Normal device function. Thresholds, sensing, and impedances consistent with implant measurements. Device programmed at 3.5V for extra safety margin until 3 month visit. Histogram distribution appropriate for patient and level of activity. 100% AT/AF + pradaxa. No ventricular arrhythmias noted. Patient educated about wound care, arm mobility, lifting restrictions, shock plan. ROV with GT 2/9.

## 2016-05-29 LAB — CUP PACEART INCLINIC DEVICE CHECK
Date Time Interrogation Session: 20171113050000
HIGH POWER IMPEDANCE MEASURED VALUE: 51 Ohm
Implantable Lead Implant Date: 20171106
Implantable Lead Location: 753859
Implantable Lead Location: 753860
Implantable Pulse Generator Implant Date: 20171106
Lead Channel Impedance Value: 468 Ohm
Lead Channel Impedance Value: 753 Ohm
Lead Channel Sensing Intrinsic Amplitude: 0.8 mV
Lead Channel Sensing Intrinsic Amplitude: 12.4 mV
Lead Channel Setting Pacing Amplitude: 3.5 V
MDC IDC LEAD IMPLANT DT: 20171106
MDC IDC LEAD SERIAL: 422759
MDC IDC LEAD SERIAL: 828526
MDC IDC MSMT LEADCHNL RV PACING THRESHOLD AMPLITUDE: 0.7 V
MDC IDC MSMT LEADCHNL RV PACING THRESHOLD PULSEWIDTH: 0.4 ms
MDC IDC SET LEADCHNL RA PACING AMPLITUDE: 3.5 V
MDC IDC SET LEADCHNL RV PACING PULSEWIDTH: 0.4 ms
MDC IDC SET LEADCHNL RV SENSING SENSITIVITY: 0.5 mV
Pulse Gen Serial Number: 519130

## 2016-08-14 ENCOUNTER — Ambulatory Visit (INDEPENDENT_AMBULATORY_CARE_PROVIDER_SITE_OTHER): Payer: Medicare Other | Admitting: *Deleted

## 2016-08-14 ENCOUNTER — Telehealth: Payer: Self-pay | Admitting: Internal Medicine

## 2016-08-14 DIAGNOSIS — Z95810 Presence of automatic (implantable) cardiac defibrillator: Secondary | ICD-10-CM

## 2016-08-14 NOTE — Telephone Encounter (Signed)
New message  Pt call requesting to speak with RN. Pt states he receive a call from the office. Please call back to discuss if needed.

## 2016-08-14 NOTE — Progress Notes (Signed)
Pt seen for concerns about defibrillator site, site well healed, pt was concerned about being able to feel the edge of the device, informed pt that this was normal. Pt voiced understanding.

## 2016-08-14 NOTE — Telephone Encounter (Signed)
New message    1. Has your device fired? no  2. Is you device beeping? no  3. Are you experiencing draining or swelling at device site? bump 4. Are you calling to see if we received your device transmission? no  5. Have you passed out? no

## 2016-08-14 NOTE — Telephone Encounter (Signed)
Spoke w/ pt, pt requested to be seen by a nurse or Dr. Lovena Le about incison site, pt agreeable to apt today at 4:30pm w/ device clinic.

## 2016-08-24 ENCOUNTER — Encounter: Payer: Self-pay | Admitting: Internal Medicine

## 2016-08-24 ENCOUNTER — Encounter (INDEPENDENT_AMBULATORY_CARE_PROVIDER_SITE_OTHER): Payer: Self-pay

## 2016-08-24 ENCOUNTER — Ambulatory Visit (INDEPENDENT_AMBULATORY_CARE_PROVIDER_SITE_OTHER): Payer: Medicare Other | Admitting: Internal Medicine

## 2016-08-24 VITALS — BP 130/80 | HR 81 | Ht 71.0 in | Wt 185.2 lb

## 2016-08-24 DIAGNOSIS — I471 Supraventricular tachycardia: Secondary | ICD-10-CM

## 2016-08-24 DIAGNOSIS — Z95810 Presence of automatic (implantable) cardiac defibrillator: Secondary | ICD-10-CM | POA: Diagnosis not present

## 2016-08-24 NOTE — Patient Instructions (Addendum)
Medication Instructions:  Your physician recommends that you continue on your current medications as directed. Please refer to the Current Medication list given to you today.   Labwork: None Ordered   Testing/Procedures: None Ordered   Follow-Up: Your physician wants you to follow-up in: 9 months with Dr. Lovena Le. You will receive a reminder letter in the mail two months in advance. If you don't receive a letter, please call our office to schedule the follow-up appointment.  Remote monitoring is used to monitor your Pacemaker or ICD from home. This monitoring reduces the number of office visits required to check your device to one time per year. It allows [place] to keep an eye on the functioning of your device to ensure it is working properly. You are scheduled for a device check from home on 11/22/16. You may send your transmission at any time that day. If you have a wireless device, the transmission will be sent automatically. After your physician reviews your transmission, you will receive a postcard with your next transmission date.     Any Other Special Instructions Will Be Listed Below (If Applicable).     If you need a refill on your cardiac medications before your next appointment, please call your pharmacy.

## 2016-08-24 NOTE — Progress Notes (Signed)
HPI [name] returns today for followup of his newly implanted ICD. He is a pleasant 76 yo man with an ICM, chronic systolic heart failure class 2A, EF 30% since August, HTN, atrial fib, and NSVT.He has not had syncope. No ICD shocks. No Known Allergies   Current Outpatient Prescriptions  Medication Sig Dispense Refill  . acetaminophen (TYLENOL) 325 MG tablet Take 650 mg by mouth every 6 (six) hours as needed (for pain.).    [name] aspirin EC 81 MG tablet Take 81 mg by mouth daily.    [name] atorvastatin (LIPITOR) 80 MG tablet Take 80 mg by mouth every evening.     . calcium-vitamin D (OSCAL WITH D) 500-200 MG-UNIT per tablet Take 1 tablet by mouth 2 (two) times daily.     . carvedilol (COREG) 6.25 MG tablet Take 1 tablet (6.25 mg total) by mouth 2 (two) times daily. 180 tablet 3  . dabigatran (PRADAXA) 150 MG CAPS capsule Take 150 mg by mouth every 12 (twelve) hours.    [name] L-LYSINE PO Take 1 capsule by mouth daily.    [name] lisinopril (PRINIVIL,ZESTRIL) 20 MG tablet Take 10 mg by mouth every evening.     . Multiple Vitamins-Minerals (MULTIVITAMIN WITH MINERALS) tablet Take 1 tablet by mouth daily.    . OMEGA 3-6-9 FATTY ACIDS PO Take 1 capsule by mouth 3 (three) times daily.     [name] omeprazole (PRILOSEC) 20 MG capsule Take 40 mg by mouth 2 (two) times daily.     . polyvinyl alcohol (LIQUIFILM TEARS) 1.4 % ophthalmic solution Place 1 drop into both eyes daily.     No current facility-administered medications for this visit.      Past Medical History:  Diagnosis Date  . A-fib ([place])   . AICD (automatic cardioverter/defibrillator) present 05/21/2016  . Arthritis    "back of my neck extending to back of head" (05/21/2016)  . Barrett's esophagus    Gets periodic endoscopies & biopsies. Coumadin has been held in the past for these precedures.   . Basal cell carcinoma    "scalp; chest; back"  . Blurred vision   . CAD (coronary artery disease)    S/P stenting of his LAD, circumflex & marginal  branch as well as his RCA in 2008.  . Cardiomyopathy ([place])    Significant ischemic cardiomyopathy. ECHO 08/27/12: EF = 45-50% (Previous Echo revealed EF = 25%)  . Carotid artery occlusion    Carotid Doppler 08/27/12 SUMMARY - Bilateral ICAs: Demonstrated normal patency without evidence of a significant diamenter reduction, tortuosity or any other vascular abnormality. This was a Normal carotid duplex Doppler Evaluation.  . Cerebral atherosclerosis    Carotid Doppler 08/27/12 SUMMARY - Bilateral ICAs: Demonstrated normal patency without evidence of a significant diamenter reduction, tortuosity or any other vascular abnormality. This was a Normal carotid duplex Doppler Evaluation.  . CHF (congestive heart failure) ([place])   . Chronic anticoagulation    For PE & PAF  . Claudication ([place])    right ABI of 0.84  . Coronary artery disease    Most recent cath by Dr. Gwenlyn Found 07/02/09 was remarkable for patent stents w/an 80% in-stent stenosis within the obtuse marginal branch stent, as well as 60% ostial RCA stenosis which was stented with a Taxus Liberte drug-eluting stent. Nuc stress test 09/03/12 was low risk & showed an inferolateral scar. & an EF of 39%  . Daily headache    "from the arthritis in back of neck" (05/21/2016)  .  DVT (deep venous thrombosis) ([place]) 09/2010   "after knee replacement"  . GERD (gastroesophageal reflux disease)   . H/O mitral valve disorder   . History of hiatal hernia   . History of kidney stones   . History of stomach ulcers   . Hyperlipidemia   . Hypertension   . Meningioma ([place]) 08/15/12   BrainLAB-guided left frontoparietal craniotomy w/removal of a meningioma.   . Obstructive sleep apnea    Sleep Study in 2011 AHI 3.2. "occasionally wear my mask" (05/21/2016)  . Paroxysmal atrial fibrillation (H[facility])   . PSVT (paroxysmal supraventricular tachycardia) ([place])    2D ECHO 08/27/12: EF = 45-50% (Previous Echo revealed EF = 25%)  . Pulmonary embolism ([place]) 09/2010   "after knee  replacement"  . Shortness of breath dyspnea   . Sick sinus syndrome ([place])   . TIA (transient ischemic attack)     ROS:   All systems reviewed and negative except as noted in the HPI.   Past Surgical History:  Procedure Laterality Date  . BASAL CELL CARCINOMA EXCISION     "head X 2; back, chest" (05/21/2016)  . CARDIAC CATHETERIZATION     Past caths in 2008, 2006, 2005 & 2003  . CATARACT EXTRACTION W/ INTRAOCULAR LENS  IMPLANT, BILATERAL Bilateral 2005  . CORONARY ANGIOPLASTY WITH STENT PLACEMENT  07/02/09   Most recent cath by Dr. Gwenlyn Found 07/02/09 was remarkable for patent stents w/an 80% in-stent stenosis within the obtuse marginal branch stent, as well as 60% ostial RCA stenosis which was stented with a Taxus Liberte drug-eluting stent.   . CORONARY ANGIOPLASTY WITH STENT PLACEMENT     "I have 12 stents in my heart" (05/21/2016)  . CRANIOTOMY  08/15/2012   At [facility]. BrainLAB-guided left frontoparietal craniotomy w/removal of a meningioma.   . EP IMPLANTABLE DEVICE N/A 05/21/2016   Procedure: ICD Implant;  Surgeon: Evans Lance, MD;  Location: [place] [facility];  Service: Cardiovascular;  Laterality: N/A;  . INGUINAL HERNIA REPAIR Right 11/23/2014   Procedure: HERNIA REPAIR INGUINAL ADULT;  Surgeon: Rochel Brome, MD;  Location: [facility] OR[facility];  Service: General;  Laterality: Right;  . INGUINAL HERNIA REPAIR Bilateral 2010-2016  . JOINT REPLACEMENT    . THROAT SURGERY     "laser for Barret's esophagus"  . TONSILLECTOMY    . TOTAL KNEE ARTHROPLASTY Left 09/2010   Surgery was complicated by PAF with a pulmonary embolism documented by CT angiogram.     Family History  Problem Relation Age of Onset  . Cancer Mother   . Cardiomyopathy Sister   . Cancer Other     Breast     Social History   Social History  . Marital status: Widowed    Spouse name: N/A  . Number of children: N/A  . Years of education: N/A   Occupational History  . Not on file.   Social History Main Topics  .  Smoking status: Former Smoker    Packs/day: 1.00    Years: 35.00    Types: Cigarettes    Quit date: 28  . Smokeless tobacco: Former Systems developer    Quit date: 1989  . Alcohol use 16.8 oz/week    28 Cans of beer per week     Comment: 4 beers per day  . Drug use: No  . Sexual activity: Yes   Other Topics Concern  . Not on file   Social History Narrative  . No narrative on file     BP 130/80  Pulse 81   Ht 5\' 11"  (1.803 m)   Wt 185 lb 3.2 oz (84 kg)   BMI 25.83 kg/m   Physical Exam:  Well appearing 76 yo man, NAD HEENT: Unremarkable Neck:  6 cm JVD, no thyromegally Back:  No CVA tenderness Lungs:  Clear with no wheezes. Well healed ICD incision. HEART:  IRegular rate rhythm, no murmurs, no rubs, no clicks Abd:  soft, positive bowel sounds, no organomegally, no rebound, no guarding Ext:  2 plus pulses, no edema, no cyanosis, no clubbing Skin:  No rashes no nodules Neuro:  CN II through XII intact, motor grossly intact  EKG -atrial fib with a controlled VR   Assess/Plan: 1. Ischemic CM - he has developed worsening LV dysfunction but he remains active. No anginal symptoms. 2. Chronic systolic heart failure - his symptoms are class 2A. Will follow. No change in meds. 3. Atrial fib - his ventricular rate is well controlled. He will continue his anti-coagulation. 4. HTN - his blood pressure is mostly well controlled. Will follow.  [name].D.

## 2016-08-27 LAB — CUP PACEART INCLINIC DEVICE CHECK
HIGH POWER IMPEDANCE MEASURED VALUE: 57 Ohm
Implantable Lead Location: 753859
Implantable Lead Model: 7741
Implantable Lead Serial Number: 828526
Lead Channel Impedance Value: 692 Ohm
Lead Channel Pacing Threshold Amplitude: 0.6 V
Lead Channel Sensing Intrinsic Amplitude: 15.7 mV
Lead Channel Sensing Intrinsic Amplitude: 2.3 mV
Lead Channel Setting Pacing Amplitude: 2 V
Lead Channel Setting Pacing Pulse Width: 0.4 ms
MDC IDC LEAD IMPLANT DT: 20171106
MDC IDC LEAD IMPLANT DT: 20171106
MDC IDC LEAD LOCATION: 753860
MDC IDC LEAD SERIAL: 422759
MDC IDC MSMT LEADCHNL RV IMPEDANCE VALUE: 523 Ohm
MDC IDC MSMT LEADCHNL RV PACING THRESHOLD PULSEWIDTH: 0.4 ms
MDC IDC PG IMPLANT DT: 20171106
MDC IDC PG SERIAL: 519130
MDC IDC SESS DTM: 20180209050000
MDC IDC SET LEADCHNL RV PACING AMPLITUDE: 2.5 V
MDC IDC SET LEADCHNL RV SENSING SENSITIVITY: 0.5 mV

## 2016-09-03 ENCOUNTER — Emergency Department: Payer: Medicare Other

## 2016-09-03 ENCOUNTER — Emergency Department
Admission: EM | Admit: 2016-09-03 | Discharge: 2016-09-03 | Disposition: A | Discharge status: Home / Self Care | Payer: Medicare Other | Attending: Emergency Medicine | Admitting: Emergency Medicine

## 2016-09-03 ENCOUNTER — Encounter: Payer: Self-pay | Admitting: Emergency Medicine

## 2016-09-03 DIAGNOSIS — Z87891 Personal history of nicotine dependence: Secondary | ICD-10-CM | POA: Insufficient documentation

## 2016-09-03 DIAGNOSIS — Z79899 Other long term (current) drug therapy: Secondary | ICD-10-CM | POA: Insufficient documentation

## 2016-09-03 DIAGNOSIS — I110 Hypertensive heart disease with heart failure: Secondary | ICD-10-CM | POA: Insufficient documentation

## 2016-09-03 DIAGNOSIS — I509 Heart failure, unspecified: Secondary | ICD-10-CM | POA: Diagnosis not present

## 2016-09-03 DIAGNOSIS — J9801 Acute bronchospasm: Secondary | ICD-10-CM | POA: Insufficient documentation

## 2016-09-03 DIAGNOSIS — Z7982 Long term (current) use of aspirin: Secondary | ICD-10-CM | POA: Diagnosis not present

## 2016-09-03 DIAGNOSIS — R05 Cough: Secondary | ICD-10-CM | POA: Diagnosis present

## 2016-09-03 DIAGNOSIS — J449 Chronic obstructive pulmonary disease, unspecified: Secondary | ICD-10-CM | POA: Diagnosis not present

## 2016-09-03 DIAGNOSIS — I2510 Atherosclerotic heart disease of native coronary artery without angina pectoris: Secondary | ICD-10-CM | POA: Diagnosis not present

## 2016-09-03 MED ORDER — BENZONATATE 100 MG PO CAPS
ORAL_CAPSULE | ORAL | Status: AC
  Administered 2016-09-03: 100 mg via ORAL
  Filled 2016-09-03: qty 1

## 2016-09-03 MED ORDER — BENZONATATE 100 MG PO CAPS
100.0000 mg | ORAL_CAPSULE | Freq: Once | ORAL | Status: AC
  Administered 2016-09-03: 100 mg via ORAL

## 2016-09-03 MED ORDER — BENZONATATE 100 MG PO CAPS: 100.0000 mg | ORAL_CAPSULE | Freq: Two times a day (BID) | ORAL | 0 refills | Status: DC

## 2016-09-03 MED ORDER — HYDROCOD POLST-CPM POLST ER 10-8 MG/5ML PO SUER: 5.0000 mL | Freq: Every evening | ORAL | 0 refills | Status: DC | PRN

## 2016-09-03 NOTE — ED Triage Notes (Signed)
Pt to ed with c/o cough, congestion, fever.  [facility]

## 2016-09-03 NOTE — ED Notes (Signed)
See triage note  States he was sent in from [facility] with some cough and congestion  Also has had fever but afebrile on arrival

## 2016-09-03 NOTE — ED Provider Notes (Signed)
[facility] Emergency Department Provider Note   ____________________________________________   First MD Initiated Contact with Patient 09/03/16 1352     (approximate)  I have reviewed the triage vital signs and the nursing notes.   HISTORY  Chief Complaint Cough and Fever    HPI [name] is a 76 y.o. male patient sent over from urgent care clinic for chest x-ray secondary to having cough, chest congestion and fever. Patient state he was given a prescription for Levaquin but has not filled it. Patient state complaints started 4 days ago. Patient denies any nausea vomiting diarrhea. Patient denies any nasal congestion associated this complaint. Patient denies any sore throat patient state has taken a flu shot this season. Patient rates his pain as a 1/10. Patient described a pain as "achy".   Past Medical History:  Diagnosis Date  . A-fib ([place])   . AICD (automatic cardioverter/defibrillator) present 05/21/2016  . Arthritis    "back of my neck extending to back of head" (05/21/2016)  . Barrett's esophagus    Gets periodic endoscopies & biopsies. Coumadin has been held in the past for these precedures.   . Basal cell carcinoma    "scalp; chest; back"  . Blurred vision   . CAD (coronary artery disease)    S/P stenting of his LAD, circumflex & marginal branch as well as his RCA in 2008.  . Cardiomyopathy ([place])    Significant ischemic cardiomyopathy. ECHO 08/27/12: EF = 45-50% (Previous Echo revealed EF = 25%)  . Carotid artery occlusion    Carotid Doppler 08/27/12 SUMMARY - Bilateral ICAs: Demonstrated normal patency without evidence of a significant diamenter reduction, tortuosity or any other vascular abnormality. This was a Normal carotid duplex Doppler Evaluation.  . Cerebral atherosclerosis    Carotid Doppler 08/27/12 SUMMARY - Bilateral ICAs: Demonstrated normal patency without evidence of a significant diamenter reduction, tortuosity or any  other vascular abnormality. This was a Normal carotid duplex Doppler Evaluation.  . CHF (congestive heart failure) ([place])   . Chronic anticoagulation    For PE & PAF  . Claudication ([place])    right ABI of 0.84  . Coronary artery disease    Most recent cath by Dr. Gwenlyn Found 07/02/09 was remarkable for patent stents w/an 80% in-stent stenosis within the obtuse marginal branch stent, as well as 60% ostial RCA stenosis which was stented with a Taxus Liberte drug-eluting stent. Nuc stress test 09/03/12 was low risk & showed an inferolateral scar. & an EF of 39%  . Daily headache    "from the arthritis in back of neck" (05/21/2016)  . DVT (deep venous thrombosis) ([place]) 09/2010   "after knee replacement"  . GERD (gastroesophageal reflux disease)   . H/O mitral valve disorder   . History of hiatal hernia   . History of kidney stones   . History of stomach ulcers   . Hyperlipidemia   . Hypertension   . Meningioma ([place]) 08/15/12   BrainLAB-guided left frontoparietal craniotomy w/removal of a meningioma.   . Obstructive sleep apnea    Sleep Study in 2011 AHI 3.2. "occasionally wear my mask" (05/21/2016)  . Paroxysmal atrial fibrillation (HCC)   . PSVT (paroxysmal supraventricular tachycardia) ([place])    2D ECHO 08/27/12: EF = 45-50% (Previous Echo revealed EF = 25%)  . Pulmonary embolism ([place]) 09/2010   "after knee replacement"  . Shortness of breath dyspnea   . Sick sinus syndrome ([place])   . TIA (transient ischemic attack)  Patient Active Problem List   Diagnosis Date Noted  . ICD (implantable cardioverter-defibrillator) in place 05/21/2016  . 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 Claudication of right lower extremity ([place]) 10/04/2015  . Preop cardiovascular exam 11/16/2014  . Palpitations 08/13/2013  . Chronic atrial fibrillation ([place]) 03/26/2013  . Hyperlipidemia 03/26/2013  . Barrett's esophagus 03/26/2013  . Obstructive sleep apnea 03/26/2013  . Cardiomyopathy, ischemic 12/19/2012  .  HTN (hypertension) 12/19/2012  . Hx pulmonary embolism 12/19/2012  . NSVT (nonsustained ventricular tachycardia) ([place]) 10/23/2012  . CAD S/P multiple PCIs 10/23/2012  . PSVT (paroxysmal supraventricular tachycardia) ([place]) 09/30/2012  . Long term (current) use of anticoagulants 09/30/2012    Past Surgical History:  Procedure Laterality Date  . BASAL CELL CARCINOMA EXCISION     "head X 2; back, chest" (05/21/2016)  . CARDIAC CATHETERIZATION     Past caths in 2008, 2006, 2005 & 2003  . CATARACT EXTRACTION W/ INTRAOCULAR LENS  IMPLANT, BILATERAL Bilateral 2005  . CORONARY ANGIOPLASTY WITH STENT PLACEMENT  07/02/09   Most recent cath by Dr. Gwenlyn Found 07/02/09 was remarkable for patent stents w/an 80% in-stent stenosis within the obtuse marginal branch stent, as well as 60% ostial RCA stenosis which was stented with a Taxus Liberte drug-eluting stent.   . CORONARY ANGIOPLASTY WITH STENT PLACEMENT     "I have 12 stents in my heart" (05/21/2016)  . CRANIOTOMY  08/15/2012   At [facility]. BrainLAB-guided left frontoparietal craniotomy w/removal of a meningioma.   . EP IMPLANTABLE DEVICE N/A 05/21/2016   Procedure: ICD Implant;  Surgeon: Evans Lance, MD;  Location: [place] [facility];  Service: Cardiovascular;  Laterality: N/A;  . INGUINAL HERNIA REPAIR Right 11/23/2014   Procedure: HERNIA REPAIR INGUINAL ADULT;  Surgeon: Rochel Brome, MD;  Location: [facility] ORS;  Service: General;  Laterality: Right;  . INGUINAL HERNIA REPAIR Bilateral 2010-2016  . JOINT REPLACEMENT    . THROAT SURGERY     "laser for Barret's esophagus"  . TONSILLECTOMY    . TOTAL KNEE ARTHROPLASTY Left 09/2010   Surgery was complicated by PAF with a pulmonary embolism documented by CT angiogram.    Prior to Admission medications   Medication Sig Start Date End Date Taking? Authorizing Provider  acetaminophen (TYLENOL) 325 MG tablet Take 650 mg by mouth every 6 (six) hours as needed (for pain.).    Historical Provider, MD  aspirin EC  81 MG tablet Take 81 mg by mouth daily.    Historical Provider, MD  atorvastatin (LIPITOR) 80 MG tablet Take 80 mg by mouth every evening.     Historical Provider, MD  benzonatate (TESSALON PERLES) 100 MG capsule Take 1 capsule (100 mg total) by mouth 2 (two) times daily. Take Tessalon Perles every 8 hours during the day. 09/03/16 09/03/17  Sable Feil, PA-C  calcium-vitamin D (OSCAL WITH D) 500-200 MG-UNIT per tablet Take 1 tablet by mouth 2 (two) times daily.     Historical Provider, MD  carvedilol (COREG) 6.25 MG tablet Take 1 tablet (6.25 mg total) by mouth 2 (two) times daily. 12/19/12   Erlene Quan, PA-C  chlorpheniramine-HYDROcodone (TUSSIONEX PENNKINETIC ER) 10-8 MG/5ML SUER Take 5 mLs by mouth at bedtime as needed for cough. 09/03/16   Sable Feil, PA-C  dabigatran (PRADAXA) 150 MG CAPS capsule Take 150 mg by mouth every 12 (twelve) hours.    Historical Provider, MD  L-LYSINE PO Take 1 capsule by mouth daily.    Historical Provider, MD  lisinopril (PRINIVIL,ZESTRIL)  20 MG tablet Take 10 mg by mouth every evening.     Historical Provider, MD  Multiple Vitamins-Minerals (MULTIVITAMIN WITH MINERALS) tablet Take 1 tablet by mouth daily.    Historical Provider, MD  OMEGA 3-6-9 FATTY ACIDS PO Take 1 capsule by mouth 3 (three) times daily.     Historical Provider, MD  omeprazole (PRILOSEC) 20 MG capsule Take 40 mg by mouth 2 (two) times daily.     Historical Provider, MD  polyvinyl alcohol (LIQUIFILM TEARS) 1.4 % ophthalmic solution Place 1 drop into both eyes daily.    Historical Provider, MD    Allergies Patient has no known allergies.  Family History  Problem Relation Age of Onset  . Cancer Mother   . Cardiomyopathy Sister   . Cancer Other     Breast    Social History Social History  Substance Use Topics  . Smoking status: Former Smoker    Packs/day: 1.00    Years: 35.00    Types: Cigarettes    Quit date: 26  . Smokeless tobacco: Former Systems developer    Quit date: 1989  . Alcohol  use 16.8 oz/week    28 Cans of beer per week     Comment: 4 beers per day    Review of Systems Constitutional: No fever/chills Eyes: No visual changes. ENT: No sore throat. Cardiovascular: Denies chest pain. Respiratory: Denies shortness of breath.Productive cough . Gastrointestinal: No abdominal pain.  No nausea, no vomiting.  No diarrhea.  No constipation. Genitourinary: Negative for dysuria. Musculoskeletal: Negative for back pain. Skin: Negative for rash. Neurological: Negative for headaches, focal weakness or numbness. Endocrine:Hypertension and hyperlipidemia. ____________________________________________   PHYSICAL EXAM:  VITAL SIGNS: ED Triage Vitals  Enc Vitals Group     BP 09/03/16 1340 (!) 150/93     Pulse Rate 09/03/16 1340 88     Resp 09/03/16 1340 20     Temp 09/03/16 1340 97.8 F (36.6 C)     Temp Source 09/03/16 1340 Oral     SpO2 09/03/16 1340 95 %     Weight 09/03/16 1336 185 lb (83.9 kg)     Height 09/03/16 1336 5\' 11"  (1.803 m)     Head Circumference --      Peak Flow --      Pain Score 09/03/16 1336 1     Pain Loc --      Pain Edu? --      Excl. in [place]? --     Constitutional: Alert and oriented. Well appearing and in no acute distress. Eyes: Conjunctivae are normal. PERRL. EOMI. Head: Atraumatic. Nose: No congestion/rhinnorhea. Mouth/Throat: Mucous membranes are moist.  Oropharynx non-erythematous. Neck: No stridor.  No cervical spine tenderness to palpation. Hematological/Lymphatic/Immunilogical: No cervical lymphadenopathy. Cardiovascular: Normal rate, regular rhythm. Grossly normal heart sounds.  Good peripheral circulation. Respiratory: Normal respiratory effort.  No retractions. Lungs CTAB. Gastrointestinal: Soft and nontender. No distention. No abdominal bruits. No CVA tenderness. Musculoskeletal: No lower extremity tenderness nor edema.  No joint effusions. Neurologic:  Normal speech and language. No gross focal neurologic deficits are  appreciated. No gait instability. Skin:  Skin is warm, dry and intact. No rash noted. Psychiatric: Mood and affect are normal. Speech and behavior are normal.  ____________________________________________   LABS (all labs ordered are listed, but only abnormal results are displayed)  Labs Reviewed - No data to display ____________________________________________  EKG   ____________________________________________  RADIOLOGY  No acute findings on x-ray of the chest. ____________________________________________   PROCEDURES  Procedure(s) performed: None  Procedures  Critical Care performed: No  ____________________________________________   INITIAL IMPRESSION / ASSESSMENT AND PLAN / ED COURSE  Pertinent labs & imaging results that were available during my care of the patient were reviewed by me and considered in my medical decision making (see chart for details).  Cough secondary to bronchospasm. Patient given discharge Instructions. Patient given prescription for Tessalon to used on the day and Tussionex at night. Patient advised follow-up family doctor if condition persists.      ____________________________________________   FINAL CLINICAL IMPRESSION(S) / ED DIAGNOSES  Final diagnoses:  Cough due to bronchospasm      NEW MEDICATIONS STARTED DURING THIS VISIT:  Discharge Medication List as of 09/03/2016  2:50 PM    START taking these medications   Details  benzonatate (TESSALON PERLES) 100 MG capsule Take 1 capsule (100 mg total) by mouth 2 (two) times daily. Take Tessalon Perles every 8 hours during the day., Starting Mon 09/03/2016, Until Tue 09/03/2017, Print    chlorpheniramine-HYDROcodone (TUSSIONEX PENNKINETIC ER) 10-8 MG/5ML SUER Take 5 mLs by mouth at bedtime as needed for cough., Starting Mon 09/03/2016, Print         Note:  This document was prepared using Dragon voice recognition software and may include unintentional dictation errors.      Sable Feil, PA-C 09/03/16 1509    Rudene Re, MD 09/06/16 [phone]

## 2016-09-18 ENCOUNTER — Ambulatory Visit (INDEPENDENT_AMBULATORY_CARE_PROVIDER_SITE_OTHER): Payer: Medicare Other | Admitting: Cardiovascular Disease

## 2016-09-18 ENCOUNTER — Encounter: Payer: Self-pay | Admitting: Cardiovascular Disease

## 2016-09-18 VITALS — BP 162/98 | HR 72 | Ht 71.0 in | Wt 178.6 lb

## 2016-09-18 DIAGNOSIS — I255 Ischemic cardiomyopathy: Secondary | ICD-10-CM

## 2016-09-18 DIAGNOSIS — I10 Essential (primary) hypertension: Secondary | ICD-10-CM

## 2016-09-18 DIAGNOSIS — I482 Chronic atrial fibrillation, unspecified: Secondary | ICD-10-CM

## 2016-09-18 DIAGNOSIS — I2589 Other forms of chronic ischemic heart disease: Secondary | ICD-10-CM | POA: Diagnosis not present

## 2016-09-18 DIAGNOSIS — E7800 Pure hypercholesterolemia, unspecified: Secondary | ICD-10-CM

## 2016-09-18 NOTE — Patient Instructions (Signed)
Medication Instructions: Your physician recommends that you continue on your current medications as directed. Please refer to the Current Medication list given to you today.  Labwork: I will request recent lab work from Dr. Ronnald Collum.  Follow-Up: Your physician wants you to follow-up in: 1 year with Dr. Gwenlyn Found. You will receive a reminder letter in the mail two months in advance. If you don't receive a letter, please call our office to schedule the follow-up appointment.  If you need a refill on your cardiac medications before your next appointment, please call your pharmacy.

## 2016-09-18 NOTE — Assessment & Plan Note (Signed)
History of hyperlipidemia on statin therapy followed by his PCP 

## 2016-09-18 NOTE — Assessment & Plan Note (Signed)
History of hypertension blood pressure measured today at 130/80. He is on carvedilol and lisinopril. Continue current meds at current dosing

## 2016-09-18 NOTE — Assessment & Plan Note (Signed)
History of chronic atrial fibrillation rate controlled on Pradaxa oral anticoagulation.

## 2016-09-18 NOTE — Assessment & Plan Note (Signed)
History of ischemic cardiomyopathy status post PCI of his LAD circumflex and marginal branch as well as his RCA in the past. I'll ask Him 07/02/09 which was remarkable for patent stents 80% "in-stent restenosis with the obtuse marginal branch stent as well as 60% ostial RCA stenosis which I stented with a Taxus Liberte drug-eluting stent. An echo performed at the [facility] in [place] on 02/17/16 revealed a decline in his ejection fraction to 30%. The subsequent had a ICD implanted by Dr. Lovena Le for primary prevention 05/21/16 which has healed nicely.

## 2016-09-18 NOTE — Progress Notes (Signed)
09/18/2016 GYAN CAMBRE   [birth year]  [id number]  Primary Physician Lenard Simmer, MD Primary Cardiologist: Lorretta Harp MD [name]  HPI:  The patient is a 76 year old, thin and fit-appearing Caucasian male who I last saw in the office 11 months ago. He is a father of 2, grandfather to 2 grandchildren. I last saw him in the office 10/04/15. He has history of CAD status post stenting of his LAD, circumflex and marginal branch as well as his RCA in the past. His most recent cath performed by myself July 02, 2009, was remarkable for patent stents with an 80% in-stent restenosis within the obtuse marginal branch stent, as well as a 60% ostial RCA stenosis which I stented using a Taxus Liberte drug-eluting stent. His other problems include obstructive sleep apnea, hypertension and hyperlipidemia. 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 He has Barrett esophagus and gets periodic endoscopies and biopsies and because of this I have stopped his Coumadin in the past. When I saw him, he denied chest pain or shortness of breath but was complaining of occasional blurred vision. He had a Myoview stress test performed that was low risk and showed inferolateral scar. He had a 2D echo that revealed an EF of 45% to 50% similar to a prior echo. Carotid Dopplers were unremarkable. He did have an event monitor that showed evidence of PAF as well as nonsustained ventricular tachycardia with a 26-beat run of monomorphic VT during which he was asymptomatic.he is fairly active and works out 6 days a week. He denies chest pain or shortness of breath. He saw Dr. Stanford Breed in the office 11/16/14 for preoperative clearance before elective right inguinal hernia repair. This was performed without complication. No functional study was ordered prior to that. He had a 2-D  echocardiogram performed after [facility] on 02/17/16 which showed an EF of 30%.. Based on this he met criteria for ICD implantation for primary prevention which was ultimately performed by Dr. Lovena Le 05/21/16 uneventfully. The site has healed. It is under his left pectoral muscle. He still works out often without limitation. He remains on Pradaxa oral anticoagulation for his chronic A. fib and history of pulmonary embolism.   Current Outpatient Prescriptions  Medication Sig Dispense Refill  . acetaminophen (TYLENOL) 325 MG tablet Take 650 mg by mouth every 6 (six) hours as needed (for pain.).    [name] aspirin EC 81 MG tablet Take 81 mg by mouth daily.    [name] atorvastatin (LIPITOR) 80 MG tablet Take 80 mg by mouth every evening.     . calcium-vitamin D (OSCAL WITH D) 500-200 MG-UNIT per tablet Take 1 tablet by mouth 2 (two) times daily.     . carvedilol (COREG) 6.25 MG tablet Take 1 tablet (6.25 mg total) by mouth 2 (two) times daily. 180 tablet 3  . chlorpheniramine-HYDROcodone (TUSSIONEX PENNKINETIC ER) 10-8 MG/5ML SUER Take 5 mLs by mouth at bedtime as needed for cough. 115 mL 0  . dabigatran (PRADAXA) 150 MG CAPS capsule Take 150 mg by mouth every 12 (twelve) hours.    [name] L-LYSINE PO Take 1 capsule by mouth daily.    [name] lisinopril (PRINIVIL,ZESTRIL) 20 MG tablet Take 10 mg by mouth every evening.     . Multiple Vitamins-Minerals (MULTIVITAMIN WITH MINERALS) tablet Take 1 tablet by mouth daily.    [name]  OMEGA 3-6-9 FATTY ACIDS PO Take 1 capsule by mouth 3 (three) times daily.     [name] omeprazole (PRILOSEC) 20 MG capsule Take 40 mg by mouth 2 (two) times daily.     . polyvinyl alcohol (LIQUIFILM TEARS) 1.4 % ophthalmic solution Place 1 drop into both eyes daily.     No current facility-administered medications for this visit.     No Known Allergies  Social History   Social History  . Marital status: Widowed    Spouse name: N/A  . Number of children: N/A  . Years of education: N/A    Occupational History  . Not on file.   Social History Main Topics  . Smoking status: Former Smoker    Packs/day: 1.00    Years: 35.00    Types: Cigarettes    Quit date: 43  . Smokeless tobacco: Former Systems developer    Quit date: 1989  . Alcohol use 16.8 oz/week    28 Cans of beer per week     Comment: 4 beers per day  . Drug use: No  . Sexual activity: Yes   Other Topics Concern  . Not on file   Social History Narrative  . No narrative on file     Review of Systems: General: negative for chills, fever, night sweats or weight changes.  Cardiovascular: negative for chest pain, dyspnea on exertion, edema, orthopnea, palpitations, paroxysmal nocturnal dyspnea or shortness of breath Dermatological: negative for rash Respiratory: negative for cough or wheezing Urologic: negative for hematuria Abdominal: negative for nausea, vomiting, diarrhea, bright red blood per rectum, melena, or hematemesis Neurologic: negative for visual changes, syncope, or dizziness All other systems reviewed and are otherwise negative except as noted above.    Blood pressure (!) 162/98, pulse 72, height _0  (1.803 m), weight 178 lb 9.6 oz (81 kg).  General appearance: alert and no distress Neck: no adenopathy, no carotid bruit, no JVD, supple, symmetrical, trachea midline and thyroid not enlarged, symmetric, no tenderness/mass/nodules Lungs: clear to auscultation bilaterally Heart: irregularly irregular rhythm Extremities: extremities normal, atraumatic, no cyanosis or edema  EKG atrial fibrillation with a ventricular response of 72 and septal Q waves. I personally reviewed this EKG.  ASSESSMENT AND PLAN:   Cardiomyopathy, ischemic History of ischemic cardiomyopathy status post PCI of his LAD circumflex and marginal branch as well as his RCA in the past. I'll ask Him 07/02/09 which was remarkable for patent stents 80% "in-stent restenosis with the obtuse marginal branch stent as well as 60% ostial  RCA stenosis which I stented with a Taxus Liberte drug-eluting stent. An echo performed at the [facility] in [place] on 02/17/16 revealed a decline in his ejection fraction to 30%. The subsequent had a ICD implanted by Dr. Lovena Le for primary prevention 05/21/16 which has healed nicely.  HTN (hypertension) History of hypertension blood pressure measured today at 130/80. He is on carvedilol and lisinopril. Continue current meds at current dosing  Chronic atrial fibrillation (HCC) History of chronic atrial fibrillation rate controlled on Pradaxa oral anticoagulation.  Hyperlipidemia History of hyperlipidemia on statin therapy followed by his PCP.      Lorretta Harp MD FACP,FACC,FAHA, [facility] 09/18/2016 8:35 AM

## 2016-10-16 ENCOUNTER — Other Ambulatory Visit: Payer: Self-pay | Admitting: Cardiovascular Disease

## 2016-10-16 DIAGNOSIS — I739 Peripheral vascular disease, unspecified: Secondary | ICD-10-CM

## 2016-10-24 ENCOUNTER — Ambulatory Visit (HOSPITAL_COMMUNITY)
Admission: RE | Admit: 2016-10-24 | Discharge: 2016-10-24 | Disposition: A | Discharge status: Home / Self Care | Payer: Medicare Other | Source: Ambulatory Visit | Attending: Cardiology | Admitting: Cardiology

## 2016-10-24 DIAGNOSIS — Z87891 Personal history of nicotine dependence: Secondary | ICD-10-CM | POA: Diagnosis not present

## 2016-10-24 DIAGNOSIS — I739 Peripheral vascular disease, unspecified: Secondary | ICD-10-CM | POA: Insufficient documentation

## 2016-10-24 DIAGNOSIS — I2510 Atherosclerotic heart disease of native coronary artery without angina pectoris: Secondary | ICD-10-CM | POA: Insufficient documentation

## 2016-10-24 DIAGNOSIS — I10 Essential (primary) hypertension: Secondary | ICD-10-CM | POA: Diagnosis not present

## 2016-10-24 DIAGNOSIS — E785 Hyperlipidemia, unspecified: Secondary | ICD-10-CM | POA: Diagnosis not present

## 2016-11-23 ENCOUNTER — Ambulatory Visit (INDEPENDENT_AMBULATORY_CARE_PROVIDER_SITE_OTHER): Payer: Medicare Other | Admitting: *Deleted

## 2016-11-23 DIAGNOSIS — I255 Ischemic cardiomyopathy: Secondary | ICD-10-CM | POA: Diagnosis not present

## 2016-11-23 LAB — CUP PACEART REMOTE DEVICE CHECK
Battery Remaining Longevity: 126 mo
Brady Statistic RA Percent Paced: 0 %
Brady Statistic RV Percent Paced: 1 %
HighPow Impedance: 57 Ohm
Implantable Lead Location: 753859
Implantable Lead Model: 293
Implantable Lead Model: 7741
Implantable Lead Serial Number: 828526
Lead Channel Impedance Value: 473 Ohm
Lead Channel Impedance Value: 696 Ohm
Lead Channel Pacing Threshold Pulse Width: 0.4 ms
Lead Channel Setting Pacing Amplitude: 2 V
Lead Channel Setting Pacing Amplitude: 2.5 V
Lead Channel Setting Pacing Pulse Width: 0.4 ms
MDC IDC LEAD IMPLANT DT: 20171106
MDC IDC LEAD IMPLANT DT: 20171106
MDC IDC LEAD LOCATION: 753860
MDC IDC LEAD SERIAL: 422759
MDC IDC MSMT BATTERY REMAINING PERCENTAGE: 100 %
MDC IDC MSMT LEADCHNL RV PACING THRESHOLD AMPLITUDE: 0.6 V
MDC IDC PG IMPLANT DT: 20171106
MDC IDC PG SERIAL: 519130
MDC IDC SESS DTM: 20180511090200
MDC IDC SET LEADCHNL RV SENSING SENSITIVITY: 0.5 mV

## 2016-11-23 NOTE — Progress Notes (Signed)
Remote ICD transmission.   

## 2016-11-27 ENCOUNTER — Encounter: Payer: Self-pay | Admitting: Cardiology

## 2017-02-22 ENCOUNTER — Ambulatory Visit (INDEPENDENT_AMBULATORY_CARE_PROVIDER_SITE_OTHER): Payer: Medicare Other | Admitting: *Deleted

## 2017-02-22 DIAGNOSIS — I255 Ischemic cardiomyopathy: Secondary | ICD-10-CM | POA: Diagnosis not present

## 2017-02-28 NOTE — Progress Notes (Signed)
Remote ICD transmission.   

## 2017-03-08 ENCOUNTER — Encounter: Payer: Self-pay | Admitting: Cardiology

## 2017-03-12 ENCOUNTER — Telehealth: Payer: Self-pay | Admitting: Internal Medicine

## 2017-03-12 LAB — CUP PACEART REMOTE DEVICE CHECK
Brady Statistic RA Percent Paced: 0 %
Date Time Interrogation Session: 20180810090200
HIGH POWER IMPEDANCE MEASURED VALUE: 58 Ohm
Implantable Lead Implant Date: 20171106
Implantable Lead Implant Date: 20171106
Implantable Lead Location: 753860
Implantable Lead Model: 293
Implantable Lead Serial Number: 422759
Lead Channel Pacing Threshold Amplitude: 0.6 V
Lead Channel Setting Pacing Pulse Width: 0.4 ms
Lead Channel Setting Sensing Sensitivity: 0.5 mV
MDC IDC LEAD LOCATION: 753859
MDC IDC LEAD SERIAL: 828526
MDC IDC MSMT BATTERY REMAINING LONGEVITY: 126 mo
MDC IDC MSMT BATTERY REMAINING PERCENTAGE: 100 %
MDC IDC MSMT LEADCHNL RA IMPEDANCE VALUE: 719 Ohm
MDC IDC MSMT LEADCHNL RV IMPEDANCE VALUE: 426 Ohm
MDC IDC MSMT LEADCHNL RV PACING THRESHOLD PULSEWIDTH: 0.4 ms
MDC IDC PG IMPLANT DT: 20171106
MDC IDC SET LEADCHNL RA PACING AMPLITUDE: 2 V
MDC IDC SET LEADCHNL RV PACING AMPLITUDE: 2.5 V
MDC IDC STAT BRADY RV PERCENT PACED: 2 %
Pulse Gen Serial Number: 519130

## 2017-03-12 NOTE — Telephone Encounter (Signed)
100% AT/AF with 3 episodes of elevated V rates. On pradaxa.  [name] made aware of results, he has OV scheduled with Dr Lovena Le in November. He is appreciative of call.

## 2017-03-12 NOTE — Telephone Encounter (Signed)
New message    Pt is calling asking for a call back about his home transmission on 8/10. Please call.

## 2017-05-08 ENCOUNTER — Other Ambulatory Visit: Payer: Self-pay | Admitting: *Deleted

## 2017-05-08 DIAGNOSIS — I739 Peripheral vascular disease, unspecified: Secondary | ICD-10-CM

## 2017-06-04 ENCOUNTER — Telehealth: Payer: Self-pay | Admitting: Internal Medicine

## 2017-06-04 ENCOUNTER — Ambulatory Visit (INDEPENDENT_AMBULATORY_CARE_PROVIDER_SITE_OTHER): Payer: Medicare Other | Admitting: *Deleted

## 2017-06-04 DIAGNOSIS — I255 Ischemic cardiomyopathy: Secondary | ICD-10-CM | POA: Diagnosis not present

## 2017-06-04 NOTE — Telephone Encounter (Signed)
[name] is returning a call . Thanks

## 2017-06-05 ENCOUNTER — Ambulatory Visit (INDEPENDENT_AMBULATORY_CARE_PROVIDER_SITE_OTHER): Payer: Medicare Other | Admitting: Internal Medicine

## 2017-06-05 ENCOUNTER — Encounter: Payer: Self-pay | Admitting: Internal Medicine

## 2017-06-05 VITALS — BP 148/92 | HR 64 | Ht 71.0 in | Wt 178.0 lb

## 2017-06-05 DIAGNOSIS — I2589 Other forms of chronic ischemic heart disease: Secondary | ICD-10-CM

## 2017-06-05 DIAGNOSIS — I255 Ischemic cardiomyopathy: Secondary | ICD-10-CM

## 2017-06-05 DIAGNOSIS — I482 Chronic atrial fibrillation, unspecified: Secondary | ICD-10-CM

## 2017-06-05 DIAGNOSIS — Z95810 Presence of automatic (implantable) cardiac defibrillator: Secondary | ICD-10-CM | POA: Diagnosis not present

## 2017-06-05 LAB — CUP PACEART INCLINIC DEVICE CHECK
Brady Statistic RA Percent Paced: 1 % — CL
Brady Statistic RV Percent Paced: 2 %
Date Time Interrogation Session: 20181121050000
HIGH POWER IMPEDANCE MEASURED VALUE: 59 Ohm
Implantable Lead Implant Date: 20171106
Implantable Lead Location: 753860
Implantable Lead Model: 293
Implantable Lead Serial Number: 422759
Implantable Lead Serial Number: 828526
Lead Channel Impedance Value: 480 Ohm
Lead Channel Pacing Threshold Pulse Width: 0.4 ms
Lead Channel Sensing Intrinsic Amplitude: 0.7 mV
Lead Channel Sensing Intrinsic Amplitude: 16 mV
Lead Channel Setting Pacing Amplitude: 2 V
Lead Channel Setting Pacing Amplitude: 2.5 V
Lead Channel Setting Pacing Pulse Width: 0.4 ms
Lead Channel Setting Sensing Sensitivity: 0.5 mV
MDC IDC LEAD IMPLANT DT: 20171106
MDC IDC LEAD LOCATION: 753859
MDC IDC MSMT LEADCHNL RA IMPEDANCE VALUE: 695 Ohm
MDC IDC MSMT LEADCHNL RV PACING THRESHOLD AMPLITUDE: 0.8 V
MDC IDC PG IMPLANT DT: 20171106
Pulse Gen Serial Number: 519130

## 2017-06-05 MED ORDER — CARVEDILOL 12.5 MG PO TABS: ORAL_TABLET | ORAL | 3 refills | Status: DC

## 2017-06-05 NOTE — Progress Notes (Signed)
Remote ICD transmission.   

## 2017-06-05 NOTE — Patient Instructions (Signed)
Medication Instructions:  Your physician has recommended you make the following change in your medication:  1.  Increase your carvedilol  -Start taking 1/2 tablet of carvedilol 12.5 mg by mouth in the morning and a whole tablet at night.  Do this for 2 weeks.  -After 2 weeks start taking one tablet (12.5 mg) by mouth twice a day.  Labwork: None ordered.  Testing/Procedures: None ordered.  Follow-Up: Your physician wants you to follow-up in: one year with Dr. Lovena Le.   You will receive a reminder letter in the mail two months in advance. If you don't receive a letter, please call our office to schedule the follow-up appointment.  Remote monitoring is used to monitor your  ICD from home. This monitoring reduces the number of office visits required to check your device to one time per year. It allows [place] to keep an eye on the functioning of your device to ensure it is working properly. You are scheduled for a device check from home on 09/04/2017. You may send your transmission at any time that day. If you have a wireless device, the transmission will be sent automatically. After your physician reviews your transmission, you will receive a postcard with your next transmission date.   Any Other Special Instructions Will Be Listed Below (If Applicable).     If you need a refill on your cardiac medications before your next appointment, please call your pharmacy.

## 2017-06-05 NOTE — Progress Notes (Signed)
HPI [name] returns today for ongoing evaluation and management of his ICD. He is a very pleasant 76 year old man with an ischemic cardiomyopathy, chronic class to a systolic heart failure, ejection fraction 30%, hypertension, and persistent atrial fibrillation. In the interim he has done well. He continues to exercise regularly with no limitation. He does note that his blood pressure has been elevated and brings a log and today demonstrating blood pressures in the 140-160 range and heart rates in the 70-80 range. No edema. No ICD shocks. No Known Allergies   Current Outpatient Medications  Medication Sig Dispense Refill  . acetaminophen (TYLENOL) 325 MG tablet Take 650 mg by mouth every 6 (six) hours as needed (for pain.).    [name] aspirin EC 81 MG tablet Take 81 mg by mouth daily.    [name] atorvastatin (LIPITOR) 80 MG tablet Take 80 mg by mouth every evening.     . calcium-vitamin D (OSCAL WITH D) 500-200 MG-UNIT per tablet Take 1 tablet by mouth 2 (two) times daily.     . carvedilol (COREG) 6.25 MG tablet Take 1 tablet (6.25 mg total) by mouth 2 (two) times daily. 180 tablet 3  . chlorpheniramine-HYDROcodone (TUSSIONEX PENNKINETIC ER) 10-8 MG/5ML SUER Take 5 mLs by mouth at bedtime as needed for cough. 115 mL 0  . dabigatran (PRADAXA) 150 MG CAPS capsule Take 150 mg by mouth every 12 (twelve) hours.    [name] L-LYSINE PO Take 1 capsule by mouth daily.    [name] lisinopril (PRINIVIL,ZESTRIL) 20 MG tablet Take 10 mg by mouth every evening.     . Multiple Vitamins-Minerals (MULTIVITAMIN WITH MINERALS) tablet Take 1 tablet by mouth daily.    . OMEGA 3-6-9 FATTY ACIDS PO Take 1 capsule by mouth 3 (three) times daily.     [name] omeprazole (PRILOSEC) 20 MG capsule Take 40 mg by mouth 2 (two) times daily.     . polyvinyl alcohol (LIQUIFILM TEARS) 1.4 % ophthalmic solution Place 1 drop into both eyes daily.     No current facility-administered medications for this visit.      Past Medical History:    Diagnosis Date  . A-fib ([place])   . AICD (automatic cardioverter/defibrillator) present 05/21/2016  . Arthritis    "back of my neck extending to back of head" (05/21/2016)  . Barrett's esophagus    Gets periodic endoscopies & biopsies. Coumadin has been held in the past for these precedures.   . Basal cell carcinoma    "scalp; chest; back"  . Blurred vision   . CAD (coronary artery disease)    S/P stenting of his LAD, circumflex & marginal branch as well as his RCA in 2008.  . Cardiomyopathy ([place])    Significant ischemic cardiomyopathy. ECHO 08/27/12: EF = 45-50% (Previous Echo revealed EF = 25%)  . Carotid artery occlusion    Carotid Doppler 08/27/12 SUMMARY - Bilateral ICAs: Demonstrated normal patency without evidence of a significant diamenter reduction, tortuosity or any other vascular abnormality. This was a Normal carotid duplex Doppler Evaluation.  . Cerebral atherosclerosis    Carotid Doppler 08/27/12 SUMMARY - Bilateral ICAs: Demonstrated normal patency without evidence of a significant diamenter reduction, tortuosity or any other vascular abnormality. This was a Normal carotid duplex Doppler Evaluation.  . CHF (congestive heart failure) ([place])   . Chronic anticoagulation    For PE & PAF  . Claudication ([place])    right ABI of 0.84  . Coronary artery disease    Most  recent cath by Dr. Gwenlyn Found 07/02/09 was remarkable for patent stents w/an 80% in-stent stenosis within the obtuse marginal branch stent, as well as 60% ostial RCA stenosis which was stented with a Taxus Liberte drug-eluting stent. Nuc stress test 09/03/12 was low risk & showed an inferolateral scar. & an EF of 39%  . Daily headache    "from the arthritis in back of neck" (05/21/2016)  . DVT (deep venous thrombosis) ([place]) 09/2010   "after knee replacement"  . GERD (gastroesophageal reflux disease)   . H/O mitral valve disorder   . History of hiatal hernia   . History of kidney stones   . History of stomach ulcers   .  Hyperlipidemia   . Hypertension   . Meningioma ([place]) 08/15/12   BrainLAB-guided left frontoparietal craniotomy w/removal of a meningioma.   . Obstructive sleep apnea    Sleep Study in 2011 AHI 3.2. "occasionally wear my mask" (05/21/2016)  . Paroxysmal atrial fibrillation (HCC)   . PSVT (paroxysmal supraventricular tachycardia) ([place])    2D ECHO 08/27/12: EF = 45-50% (Previous Echo revealed EF = 25%)  . Pulmonary embolism ([place]) 09/2010   "after knee replacement"  . Shortness of breath dyspnea   . Sick sinus syndrome ([place])   . TIA (transient ischemic attack)     ROS:   All systems reviewed and negative except as noted in the HPI.   Past Surgical History:  Procedure Laterality Date  . BASAL CELL CARCINOMA EXCISION     "head X 2; back, chest" (05/21/2016)  . CARDIAC CATHETERIZATION     Past caths in 2008, 2006, 2005 & 2003  . CATARACT EXTRACTION W/ INTRAOCULAR LENS  IMPLANT, BILATERAL Bilateral 2005  . CORONARY ANGIOPLASTY WITH STENT PLACEMENT  07/02/09   Most recent cath by Dr. Gwenlyn Found 07/02/09 was remarkable for patent stents w/an 80% in-stent stenosis within the obtuse marginal branch stent, as well as 60% ostial RCA stenosis which was stented with a Taxus Liberte drug-eluting stent.   . CORONARY ANGIOPLASTY WITH STENT PLACEMENT     "I have 12 stents in my heart" (05/21/2016)  . CRANIOTOMY  08/15/2012   At [facility]. BrainLAB-guided left frontoparietal craniotomy w/removal of a meningioma.   . EP IMPLANTABLE DEVICE N/A 05/21/2016   Procedure: ICD Implant;  Surgeon: Evans Lance, MD;  Location: [facility];  Service: Cardiovascular;  Laterality: N/A;  . INGUINAL HERNIA REPAIR Right 11/23/2014   Procedure: HERNIA REPAIR INGUINAL ADULT;  Surgeon: Rochel Brome, MD;  Location: [facility] ORS;  Service: General;  Laterality: Right;  . INGUINAL HERNIA REPAIR Bilateral 2010-2016  . JOINT REPLACEMENT    . THROAT SURGERY     "laser for Barret's esophagus"  . TONSILLECTOMY    . TOTAL KNEE  ARTHROPLASTY Left 09/2010   Surgery was complicated by PAF with a pulmonary embolism documented by CT angiogram.     Family History  Problem Relation Age of Onset  . Cancer Mother   . Cardiomyopathy Sister   . Cancer Other        Breast     Social History   Socioeconomic History  . Marital status: Widowed    Spouse name: Not on file  . Number of children: Not on file  . Years of education: Not on file  . Highest education level: Not on file  Social Needs  . Financial resource strain: Not on file  . Food insecurity - worry: Not on file  . Food insecurity - inability: Not on  file  . Transportation needs - medical: Not on file  . Transportation needs - non-medical: Not on file  Occupational History  . Not on file  Tobacco Use  . Smoking status: Former Smoker    Packs/day: 1.00    Years: 35.00    Pack years: 35.00    Types: Cigarettes    Last attempt to quit: 1980    Years since quitting: 38.9  . Smokeless tobacco: Former Systems developer    Quit date: 1989  Substance and Sexual Activity  . Alcohol use: Yes    Alcohol/week: 16.8 oz    Types: 28 Cans of beer per week    Comment: 4 beers per day  . Drug use: No  . Sexual activity: Yes  Other Topics Concern  . Not on file  Social History Narrative  . Not on file     BP (!) 148/92   Pulse 64   Ht 5\' 11"  (1.803 m)   Wt 178 lb (80.7 kg)   SpO2 98%   BMI 24.83 kg/m   Physical Exam:  Well appearing 76 year old man who looks younger than his stated age, NAD HEENT: Unremarkable Neck:  No JVD, no thyromegally Lymphatics:  No adenopathy Back:  No CVA tenderness Lungs:  Clear, with no wheezes, rales, or rhonchi. Well-healed ICD incision HEART:  Regular rate rhythm, no murmurs, no rubs, no clicks Abd:  soft, positive bowel sounds, no organomegally, no rebound, no guarding Ext:  2 plus pulses, no edema, no cyanosis, no clubbing Skin:  No rashes no nodules Neuro:  CN II through XII intact, motor grossly intact   DEVICE    Normal device function.  See PaceArt for details.   Assess/Plan: 1. Persistent atrial fibrillation - he will continue his systemic anticoagulation. His ventricular rate appears to be well-controlled. 2. Chronic systolic heart failure - his symptoms remain class IIa. He will continue his current medical therapy. 3. Hypertension, uncontrolled - his blood pressures had been a little elevated. Today I've asked him to up titrate his carvedilol from 6.25 twice daily to 12.5 mg twice daily. 4. ICD - his [place] has been interrogated and found to be working normally. He has approximately 10 years of battery longevity.  Cristopher Peru, M.D.

## 2017-06-06 LAB — CUP PACEART REMOTE DEVICE CHECK
Brady Statistic RA Percent Paced: 0 %
Date Time Interrogation Session: 20181120100300
HighPow Impedance: 56 Ohm
Implantable Lead Implant Date: 20171106
Implantable Lead Location: 753859
Implantable Lead Location: 753860
Implantable Lead Model: 293
Implantable Lead Serial Number: 422759
Implantable Lead Serial Number: 828526
Lead Channel Impedance Value: 463 Ohm
Lead Channel Pacing Threshold Amplitude: 0.6 V
Lead Channel Pacing Threshold Pulse Width: 0.4 ms
Lead Channel Setting Pacing Pulse Width: 0.4 ms
Lead Channel Setting Sensing Sensitivity: 0.5 mV
MDC IDC LEAD IMPLANT DT: 20171106
MDC IDC MSMT BATTERY REMAINING LONGEVITY: 126 mo
MDC IDC MSMT BATTERY REMAINING PERCENTAGE: 100 %
MDC IDC MSMT LEADCHNL RA IMPEDANCE VALUE: 688 Ohm
MDC IDC PG IMPLANT DT: 20171106
MDC IDC SET LEADCHNL RA PACING AMPLITUDE: 2 V
MDC IDC SET LEADCHNL RV PACING AMPLITUDE: 2.5 V
MDC IDC STAT BRADY RV PERCENT PACED: 2 %
Pulse Gen Serial Number: 519130

## 2017-06-11 ENCOUNTER — Encounter: Payer: Self-pay | Admitting: Emergency Medicine

## 2017-06-11 ENCOUNTER — Emergency Department: Payer: Medicare Other

## 2017-06-11 ENCOUNTER — Emergency Department
Admission: EM | Admit: 2017-06-11 | Discharge: 2017-06-12 | Disposition: A | Discharge status: Home / Self Care | Payer: Medicare Other | Attending: Emergency Medicine | Admitting: Emergency Medicine

## 2017-06-11 DIAGNOSIS — Z96652 Presence of left artificial knee joint: Secondary | ICD-10-CM | POA: Insufficient documentation

## 2017-06-11 DIAGNOSIS — Z95810 Presence of automatic (implantable) cardiac defibrillator: Secondary | ICD-10-CM | POA: Insufficient documentation

## 2017-06-11 DIAGNOSIS — Z7901 Long term (current) use of anticoagulants: Secondary | ICD-10-CM | POA: Diagnosis not present

## 2017-06-11 DIAGNOSIS — J449 Chronic obstructive pulmonary disease, unspecified: Secondary | ICD-10-CM | POA: Diagnosis not present

## 2017-06-11 DIAGNOSIS — I10 Essential (primary) hypertension: Secondary | ICD-10-CM | POA: Diagnosis present

## 2017-06-11 DIAGNOSIS — Z86711 Personal history of pulmonary embolism: Secondary | ICD-10-CM | POA: Insufficient documentation

## 2017-06-11 DIAGNOSIS — I110 Hypertensive heart disease with heart failure: Secondary | ICD-10-CM | POA: Insufficient documentation

## 2017-06-11 DIAGNOSIS — Z87891 Personal history of nicotine dependence: Secondary | ICD-10-CM | POA: Insufficient documentation

## 2017-06-11 DIAGNOSIS — I509 Heart failure, unspecified: Secondary | ICD-10-CM | POA: Insufficient documentation

## 2017-06-11 DIAGNOSIS — Z85828 Personal history of other malignant neoplasm of skin: Secondary | ICD-10-CM | POA: Insufficient documentation

## 2017-06-11 DIAGNOSIS — Z79899 Other long term (current) drug therapy: Secondary | ICD-10-CM | POA: Insufficient documentation

## 2017-06-11 DIAGNOSIS — Z7982 Long term (current) use of aspirin: Secondary | ICD-10-CM | POA: Insufficient documentation

## 2017-06-11 DIAGNOSIS — I2510 Atherosclerotic heart disease of native coronary artery without angina pectoris: Secondary | ICD-10-CM | POA: Insufficient documentation

## 2017-06-11 LAB — BASIC METABOLIC PANEL
Anion gap: 7 (ref 5–15)
BUN: 16 mg/dL (ref 6–20)
CALCIUM: 9.1 mg/dL (ref 8.9–10.3)
CO2: 24 mmol/L (ref 22–32)
CREATININE: 0.75 mg/dL (ref 0.61–1.24)
Chloride: 109 mmol/L (ref 101–111)
GFR calc non Af Amer: >60 mL/min (ref >60)
GLUCOSE: 120 mg/dL — AB (ref 65–99)
Potassium: 4 mmol/L (ref 3.5–5.1)
Sodium: 140 mmol/L (ref 135–145)

## 2017-06-11 LAB — TROPONIN I: Troponin I: 0.03 ng/mL (ref <0.03)

## 2017-06-11 LAB — CBC
HCT: 39.9 % — ABNORMAL LOW (ref 40.0–52.0)
Hemoglobin: 13.6 g/dL (ref 13.0–18.0)
MCH: 34.2 pg — AB (ref 26.0–34.0)
MCHC: 34 g/dL (ref 32.0–36.0)
MCV: 100.8 fL — ABNORMAL HIGH (ref 80.0–100.0)
PLATELETS: 114 10*3/uL — AB (ref 150–440)
RBC: 3.96 MIL/uL — ABNORMAL LOW (ref 4.40–5.90)
RDW: 15 % — ABNORMAL HIGH (ref 11.5–14.5)
WBC: 4.6 10*3/uL (ref 3.8–10.6)

## 2017-06-11 NOTE — ED Notes (Signed)
Dr. Goodman at bedside.  

## 2017-06-11 NOTE — ED Notes (Signed)
Pt ambulated to the bathroom and returned to his bed without difficulty.

## 2017-06-11 NOTE — ED Notes (Signed)
Pt states hx HTN, states PCP upped his medication last week d/t it being high. States it normally runs around 140's. States today noticed 170's and 180's. States HA yesterday R posterior head. Believes HA was from heavy lifting and muscle straining. Denies HA, blurred vision, dizziness. Pt is alert and oriented x 4. No distress noted.

## 2017-06-11 NOTE — ED Notes (Signed)
Lab called with a critical high troponin of 0.03 Dr. Goodman was notified.  

## 2017-06-11 NOTE — ED Triage Notes (Signed)
Pt c/o hypertension this evening, HA and chest pain x1 day. Pt has 12 stents, pacemaker and defibrillator in place. Pt denies SOB, N/V.

## 2017-06-11 NOTE — Telephone Encounter (Signed)
Seen in office 06/05/17.

## 2017-06-11 NOTE — ED Provider Notes (Signed)
[facility] Emergency Department Provider Note  ____________________________________________   I have reviewed the triage vital signs and the nursing notes.   HISTORY  Chief Complaint Hypertension   History limited by: Not Limited   HPI [name] is a 76 y.o. male who presents to the emergency department today because of concern for high blood pressure.  DURATION:a couple of weeks TIMING: worse today, waxing and waning CONTEXT: patient states that his blood pressure has been high for a couple of weeks. Had his blood pressure medication increased last week by his primary care physician.  MODIFYING FACTORS: none identified ASSOCIATED SYMPTOMS: patient states he did have some tightness in his upper abdomen  Per medical record review patient has a history of high blood pressure. Coronary artery disease.   Past Medical History:  Diagnosis Date  . A-fib ([place])   . AICD (automatic cardioverter/defibrillator) present 05/21/2016  . Arthritis    "back of my neck extending to back of head" (05/21/2016)  . Barrett's esophagus    Gets periodic endoscopies & biopsies. Coumadin has been held in the past for these precedures.   . Basal cell carcinoma    "scalp; chest; back"  . Blurred vision   . CAD (coronary artery disease)    S/P stenting of his LAD, circumflex & marginal branch as well as his RCA in 2008.  . Cardiomyopathy ([place])    Significant ischemic cardiomyopathy. ECHO 08/27/12: EF = 45-50% (Previous Echo revealed EF = 25%)  . Carotid artery occlusion    Carotid Doppler 08/27/12 SUMMARY - Bilateral ICAs: Demonstrated normal patency without evidence of a significant diamenter reduction, tortuosity or any other vascular abnormality. This was a Normal carotid duplex Doppler Evaluation.  . Cerebral atherosclerosis    Carotid Doppler 08/27/12 SUMMARY - Bilateral ICAs: Demonstrated normal patency without evidence of a significant diamenter reduction, tortuosity  or any other vascular abnormality. This was a Normal carotid duplex Doppler Evaluation.  . CHF (congestive heart failure) ([place])   . Chronic anticoagulation    For PE & PAF  . Claudication ([place])    right ABI of 0.84  . Coronary artery disease    Most recent cath by Dr. Gwenlyn Found 07/02/09 was remarkable for patent stents w/an 80% in-stent stenosis within the obtuse marginal branch stent, as well as 60% ostial RCA stenosis which was stented with a Taxus Liberte drug-eluting stent. Nuc stress test 09/03/12 was low risk & showed an inferolateral scar. & an EF of 39%  . Daily headache    "from the arthritis in back of neck" (05/21/2016)  . DVT (deep venous thrombosis) ([place]) 09/2010   "after knee replacement"  . GERD (gastroesophageal reflux disease)   . H/O mitral valve disorder   . History of hiatal hernia   . History of kidney stones   . History of stomach ulcers   . Hyperlipidemia   . Hypertension   . Meningioma ([place]) 08/15/12   BrainLAB-guided left frontoparietal craniotomy w/removal of a meningioma.   . Obstructive sleep apnea    Sleep Study in 2011 AHI 3.2. "occasionally wear my mask" (05/21/2016)  . Paroxysmal atrial fibrillation (HCC)   . PSVT (paroxysmal supraventricular tachycardia) ([place])    2D ECHO 08/27/12: EF = 45-50% (Previous Echo revealed EF = 25%)  . Pulmonary embolism ([place]) 09/2010   "after knee replacement"  . Shortness of breath dyspnea   . Sick sinus syndrome ([place])   . TIA (transient ischemic attack)     Patient Active Problem  List   Diagnosis Date Noted  . ICD (implantable cardioverter-defibrillator) in place 05/21/2016  . 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 Claudication of right lower extremity ([place]) 10/04/2015  . Preop cardiovascular exam 11/16/2014  . Palpitations 08/13/2013  . Chronic atrial fibrillation ([place]) 03/26/2013  . Hyperlipidemia 03/26/2013  . Barrett's esophagus 03/26/2013  . Obstructive sleep apnea 03/26/2013  . Cardiomyopathy, ischemic 12/19/2012   . HTN (hypertension) 12/19/2012  . Hx pulmonary embolism 12/19/2012  . NSVT (nonsustained ventricular tachycardia) ([place]) 10/23/2012  . CAD S/P multiple PCIs 10/23/2012  . PSVT (paroxysmal supraventricular tachycardia) ([place]) 09/30/2012  . Long term (current) use of anticoagulants 09/30/2012    Past Surgical History:  Procedure Laterality Date  . BASAL CELL CARCINOMA EXCISION     "head X 2; back, chest" (05/21/2016)  . CARDIAC CATHETERIZATION     Past caths in 2008, 2006, 2005 & 2003  . CATARACT EXTRACTION W/ INTRAOCULAR LENS  IMPLANT, BILATERAL Bilateral 2005  . CORONARY ANGIOPLASTY WITH STENT PLACEMENT  07/02/09   Most recent cath by Dr. Gwenlyn Found 07/02/09 was remarkable for patent stents w/an 80% in-stent stenosis within the obtuse marginal branch stent, as well as 60% ostial RCA stenosis which was stented with a Taxus Liberte drug-eluting stent.   . CORONARY ANGIOPLASTY WITH STENT PLACEMENT     "I have 12 stents in my heart" (05/21/2016)  . CRANIOTOMY  08/15/2012   At [facility]. BrainLAB-guided left frontoparietal craniotomy w/removal of a meningioma.   . EP IMPLANTABLE DEVICE N/A 05/21/2016   Procedure: ICD Implant;  Surgeon: Evans Lance, MD;  Location: [facility];  Service: Cardiovascular;  Laterality: N/A;  . INGUINAL HERNIA REPAIR Right 11/23/2014   Procedure: HERNIA REPAIR INGUINAL ADULT;  Surgeon: Rochel Brome, MD;  Location: [facility] ORS;  Service: General;  Laterality: Right;  . INGUINAL HERNIA REPAIR Bilateral 2010-2016  . JOINT REPLACEMENT    . THROAT SURGERY     "laser for Barret's esophagus"  . TONSILLECTOMY    . TOTAL KNEE ARTHROPLASTY Left 09/2010   Surgery was complicated by PAF with a pulmonary embolism documented by CT angiogram.    Prior to Admission medications   Medication Sig Start Date End Date Taking? Authorizing Provider  acetaminophen (TYLENOL) 325 MG tablet Take 650 mg by mouth every 6 (six) hours as needed (for pain.).    [provider]  aspirin  EC 81 MG tablet Take 81 mg by mouth daily.    [provider]  atorvastatin (LIPITOR) 80 MG tablet Take 80 mg by mouth every evening.     [provider]  calcium-vitamin D (OSCAL WITH D) 500-200 MG-UNIT per tablet Take 1 tablet by mouth 2 (two) times daily.     [provider]  carvedilol (COREG) 12.5 MG tablet Take 1/2 tablet in the am and 1 tablet in the pm for 2 weeks.  Then 1 tablet by mouth twice a day. 06/05/17   Evans Lance, MD  chlorpheniramine-HYDROcodone [facility] PENNKINETIC ER) 10-8 MG/5ML SUER Take 5 mLs by mouth at bedtime as needed for cough. 09/03/16   Sable Feil, PA-C  dabigatran (PRADAXA) 150 MG CAPS capsule Take 150 mg by mouth every 12 (twelve) hours.    [provider]  L-LYSINE PO Take 1 capsule by mouth daily.    [provider]  lisinopril (PRINIVIL,ZESTRIL) 20 MG tablet Take 10 mg by mouth every evening.     [provider]  Multiple Vitamins-Minerals (MULTIVITAMIN WITH MINERALS) tablet Take 1  tablet by mouth daily.    [provider]  OMEGA 3-6-9 FATTY ACIDS PO Take 1 capsule by mouth 3 (three) times daily.     [provider]  omeprazole (PRILOSEC) 20 MG capsule Take 40 mg by mouth 2 (two) times daily.     [provider]  polyvinyl alcohol (LIQUIFILM TEARS) 1.4 % ophthalmic solution Place 1 drop into both eyes daily.    [provider]    Allergies Patient has no known allergies.  Family History  Problem Relation Age of Onset  . Cancer Mother   . Cardiomyopathy Sister   . Cancer Other        Breast    Social History Social History   Tobacco Use  . Smoking status: Former Smoker    Packs/day: 1.00    Years: 35.00    Pack years: 35.00    Types: Cigarettes    Last attempt to quit: 1980    Years since quitting: 38.9  . Smokeless tobacco: Former Systems developer    Quit date: 1989  Substance Use Topics  . Alcohol use: Yes    Alcohol/week: 16.8 oz    Types: 28 Cans of  beer per week    Comment: 4 beers per day  . Drug use: No    Review of Systems Constitutional: No fever/chills Eyes: No visual changes. ENT: No sore throat. Cardiovascular: Denies chest pain. Respiratory: Denies shortness of breath. Gastrointestinal: Positive for upper abdominal pain. Genitourinary: Negative for dysuria. Musculoskeletal: Negative for back pain. Skin: Negative for rash. Neurological: Negative for headaches, focal weakness or numbness.  ____________________________________________   PHYSICAL EXAM:  VITAL SIGNS: ED Triage Vitals  Enc Vitals Group     BP 06/11/17 2017 (!) 182/98     Pulse Rate 06/11/17 2017 75     Resp 06/11/17 2017 17     Temp 06/11/17 2017 98 F (36.7 C)     Temp Source 06/11/17 2017 Oral     SpO2 06/11/17 2017 97 %     Weight 06/11/17 2018 178 lb (80.7 kg)   Constitutional: Alert and oriented. Well appearing and in no distress. Eyes: Conjunctivae are normal.  ENT   Head: Normocephalic and atraumatic.   Nose: No congestion/rhinnorhea.   Mouth/Throat: Mucous membranes are moist.   Neck: No stridor. Hematological/Lymphatic/Immunilogical: No cervical lymphadenopathy. Cardiovascular: Normal rate, regular rhythm.  No murmurs, rubs, or gallops.  Respiratory: Normal respiratory effort without tachypnea nor retractions. Breath sounds are clear and equal bilaterally. No wheezes/rales/rhonchi. Gastrointestinal: Soft and non tender. No rebound. No guarding.  Genitourinary: Deferred Musculoskeletal: Normal range of motion in all extremities. No lower extremity edema. Neurologic:  Normal speech and language. No gross focal neurologic deficits are appreciated.  Skin:  Skin is warm, dry and intact. No rash noted. Psychiatric: Mood and affect are normal. Speech and behavior are normal. Patient exhibits appropriate insight and judgment.  ____________________________________________    LABS (pertinent positives/negatives)  Trop  <0.03 BMP wnl except glu 120 CBC wbc 4.6, hgb 13.6, plt 114  ____________________________________________   EKG  I, Nance Pear, attending physician, personally viewed and interpreted this EKG  EKG Time: 2020 Rate: 67 Rhythm: atrial fibrillation Axis: left axis deviation Intervals: qtc 443 QRS: narrow ST changes: no st elevation Impression: abnormal ekg  ____________________________________________    RADIOLOGY  CXR Mild cariomegaly, no edema Pleural calcifications  ____________________________________________   PROCEDURES  Procedures  ____________________________________________   INITIAL IMPRESSION / ASSESSMENT AND PLAN / ED COURSE  Pertinent labs &  imaging results that were available during my care of the patient were reviewed by me and considered in my medical decision making (see chart for details).  Patient presented to the emergency department today because of concern for elevated blood pressure. Pressure has been elevated recently and had his blood pressure medication adjusted last week. No significant headache. Had one brief episode of upper abdominal tightness. Trop was initially negative and then repeat 0.03. At this point no other concerning symptoms for cardiac etiology of the discomfort. Doubt ACS at this point. Did discuss this finding with the patient. He did feel comfortable going home. Has follow up appointment scheduled later this week. Discussed return precautions with the patient.   ____________________________________________   FINAL CLINICAL IMPRESSION(S) / ED DIAGNOSES  Final diagnoses:  Hypertension, unspecified type     Note: This dictation was prepared with Dragon dictation. Any transcriptional errors that result from this process are unintentional     Nance Pear, MD 06/12/17 [phone]

## 2017-06-12 NOTE — Discharge Instructions (Signed)
Please seek medical attention for any high fevers, chest pain, shortness of breath, change in behavior, persistent vomiting, bloody stool or any other new or concerning symptoms.  

## 2017-06-13 ENCOUNTER — Encounter: Payer: Self-pay | Admitting: Cardiology

## 2017-09-04 ENCOUNTER — Ambulatory Visit (INDEPENDENT_AMBULATORY_CARE_PROVIDER_SITE_OTHER): Payer: Medicare Other | Admitting: *Deleted

## 2017-09-04 DIAGNOSIS — I255 Ischemic cardiomyopathy: Secondary | ICD-10-CM | POA: Diagnosis not present

## 2017-09-04 NOTE — Progress Notes (Signed)
Remote ICD transmission.   

## 2017-09-05 ENCOUNTER — Encounter: Payer: Self-pay | Admitting: Cardiology

## 2017-09-05 NOTE — Progress Notes (Signed)
Letter  

## 2017-09-11 LAB — CUP PACEART REMOTE DEVICE CHECK
Battery Remaining Longevity: 126 mo
Battery Remaining Percentage: 100 %
Date Time Interrogation Session: 20190220100100
HighPow Impedance: 59 Ohm
Implantable Lead Implant Date: 20171106
Implantable Lead Implant Date: 20171106
Implantable Lead Location: 753859
Implantable Lead Location: 753860
Implantable Lead Model: 293
Implantable Lead Model: 7741
Implantable Pulse Generator Implant Date: 20171106
Lead Channel Setting Pacing Amplitude: 2 V
Lead Channel Setting Pacing Pulse Width: 0.4 ms
MDC IDC LEAD SERIAL: 422759
MDC IDC LEAD SERIAL: 828526
MDC IDC MSMT LEADCHNL RA IMPEDANCE VALUE: 675 Ohm
MDC IDC MSMT LEADCHNL RV IMPEDANCE VALUE: 470 Ohm
MDC IDC MSMT LEADCHNL RV PACING THRESHOLD AMPLITUDE: 0.8 V
MDC IDC MSMT LEADCHNL RV PACING THRESHOLD PULSEWIDTH: 0.4 ms
MDC IDC SET LEADCHNL RV PACING AMPLITUDE: 2.5 V
MDC IDC SET LEADCHNL RV SENSING SENSITIVITY: 0.5 mV
MDC IDC STAT BRADY RA PERCENT PACED: 0 %
MDC IDC STAT BRADY RV PERCENT PACED: 4 %
Pulse Gen Serial Number: 519130

## 2017-09-18 ENCOUNTER — Ambulatory Visit (INDEPENDENT_AMBULATORY_CARE_PROVIDER_SITE_OTHER): Payer: Medicare Other | Admitting: Cardiovascular Disease

## 2017-09-18 ENCOUNTER — Encounter: Payer: Self-pay | Admitting: Cardiovascular Disease

## 2017-09-18 VITALS — BP 144/80 | HR 64 | Ht 71.0 in | Wt 179.0 lb

## 2017-09-18 DIAGNOSIS — I10 Essential (primary) hypertension: Secondary | ICD-10-CM

## 2017-09-18 DIAGNOSIS — I482 Chronic atrial fibrillation, unspecified: Secondary | ICD-10-CM

## 2017-09-18 DIAGNOSIS — E7800 Pure hypercholesterolemia, unspecified: Secondary | ICD-10-CM | POA: Diagnosis not present

## 2017-09-18 DIAGNOSIS — I2510 Atherosclerotic heart disease of native coronary artery without angina pectoris: Secondary | ICD-10-CM

## 2017-09-18 DIAGNOSIS — I255 Ischemic cardiomyopathy: Secondary | ICD-10-CM

## 2017-09-18 DIAGNOSIS — Z9861 Coronary angioplasty status: Secondary | ICD-10-CM

## 2017-09-18 MED ORDER — CARVEDILOL 25 MG PO TABS: 25.0000 mg | ORAL_TABLET | Freq: Two times a day (BID) | ORAL | 1 refills | Status: DC

## 2017-09-18 NOTE — Assessment & Plan Note (Signed)
History of CAD status post multiple coronary interventions including stenting of his LAD, circumflex and marginal branch as well as RCA in the past. His most recent cardiac catheterization performed by myself 07/02/09 was remarkable for patent stents with 80% "in-stent restenosis within the obtuse marginal branch stent and 60% ostial RCA stenosis which I stented using a Taxus Liberte drug-eluting stent. He denies chest pain or shortness of breath.

## 2017-09-18 NOTE — Progress Notes (Signed)
09/18/2017 [name]   [birth year]  [id number]  Primary Physician Morayati, Lourdes Sledge, MD Primary Cardiologist: Lorretta Harp MD FACP, [place], [place], [place]  HPI:  [name] is a 77 y.o.  thin and fit-appearing Caucasian male who I last saw in the office 09/18/16.[name] He is a father of 2, grandfather to 2 grandchildren. I last saw him in the office 10/04/15. He has history of CAD status post stenting of his LAD, circumflex and marginal branch as well as his RCA in the past. His most recent cath performed by myself July 02, 2009, was remarkable for patent stents with an 80% in-stent restenosis within the obtuse marginal branch stent, as well as a 60% ostial RCA stenosis which I stented using a Taxus Liberte drug-eluting stent. His other problems include obstructive sleep apnea, hypertension and hyperlipidemia. 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 He has Barrett esophagus and gets periodic endoscopies and biopsies and because of this I have stopped his Coumadin in the past. When I saw him, he denied chest pain or shortness of breath but was complaining of occasional blurred vision. He had a Myoview stress test performed that was low risk and showed inferolateral scar. He had a 2D echo that revealed an EF of 45% to 50% similar to a prior echo. Carotid Dopplers were unremarkable. He did have an event monitor that showed evidence of PAF as well as nonsustained ventricular tachycardia with a 26-beat run of monomorphic VT during which he was asymptomatic.he is fairly active and works out 6 days a week. He denies chest pain or shortness of breath. He saw Dr. Stanford Breed in the office 11/16/14 for preoperative clearance before elective right inguinal hernia repair. This was performed without complication. No functional study was ordered prior to that. He had a 2-D  echocardiogram performed after [facility] on 02/17/16 which showed an EF of 30%.. Based on this he met criteria for ICD implantation for primary prevention which was ultimately performed by Dr. Lovena Le 05/21/16 uneventfully. The site has healed. It is under his left pectoral muscle. He still works out often without limitation. He remains on Pradaxa oral anticoagulation for his chronic A. fib and history of pulmonary embolism. Since I saw him a year ago he's remained stable. He continues to be active and works out on a treadmill and elliptical for 30 minutes a day each as well as doing pushups in pull-ups and lifting weights without limitation.     Current Meds  Medication Sig  . acetaminophen (TYLENOL) 325 MG tablet Take 650 mg by mouth every 6 (six) hours as needed (for pain.).  [name] aspirin EC 81 MG tablet Take 81 mg by mouth daily.  [name] atorvastatin (LIPITOR) 80 MG tablet Take 80 mg by mouth every evening.   . calcium-vitamin D (OSCAL WITH D) 500-200 MG-UNIT per tablet Take 1 tablet by mouth 2 (two) times daily.   . carvedilol (COREG) 25 MG tablet Take 1 tablet (25 mg total) by mouth 2 (two) times daily with a meal.  . dabigatran (PRADAXA) 150 MG CAPS capsule Take 150 mg by mouth every 12 (twelve) hours.  [name] lisinopril (PRINIVIL,ZESTRIL) 20 MG tablet Take 10 mg by mouth every evening.   . Multiple Vitamins-Minerals (MULTIVITAMIN WITH MINERALS) tablet Take 1 tablet by mouth daily.  [name] omeprazole (PRILOSEC) 20 MG  capsule Take 40 mg by mouth 2 (two) times daily.   . polyvinyl alcohol (LIQUIFILM TEARS) 1.4 % ophthalmic solution Place 1 drop into both eyes daily.  . [DISCONTINUED] carvedilol (COREG) 12.5 MG tablet Take 1/2 tablet in the am and 1 tablet in the pm for 2 weeks.  Then 1 tablet by mouth twice a day. (Patient taking differently: Take 12.5 mg by mouth 2 (two) times daily with a meal. Take 1/2 tablet in the am and 1 tablet in the pm for 2 weeks.  Then 1 tablet by mouth twice a day.)  .  [DISCONTINUED] chlorpheniramine-HYDROcodone (TUSSIONEX PENNKINETIC ER) 10-8 MG/5ML SUER Take 5 mLs by mouth at bedtime as needed for cough.  . [DISCONTINUED] L-LYSINE PO Take 1 capsule by mouth daily.  . [DISCONTINUED] OMEGA 3-6-9 FATTY ACIDS PO Take 1 capsule by mouth 3 (three) times daily.      No Known Allergies  Social History   Socioeconomic History  . Marital status: Widowed    Spouse name: Not on file  . Number of children: Not on file  . Years of education: Not on file  . Highest education level: Not on file  Social Needs  . Financial resource strain: Not on file  . Food insecurity - worry: Not on file  . Food insecurity - inability: Not on file  . Transportation needs - medical: Not on file  . Transportation needs - non-medical: Not on file  Occupational History  . Not on file  Tobacco Use  . Smoking status: Former Smoker    Packs/day: 1.00    Years: 35.00    Pack years: 35.00    Types: Cigarettes    Last attempt to quit: 1980    Years since quitting: 39.2  . Smokeless tobacco: Former Systems developer    Quit date: 1989  Substance and Sexual Activity  . Alcohol use: Yes    Alcohol/week: 16.8 oz    Types: 28 Cans of beer per week    Comment: 4 beers per day  . Drug use: No  . Sexual activity: Yes  Other Topics Concern  . Not on file  Social History Narrative  . Not on file     Review of Systems: General: negative for chills, fever, night sweats or weight changes.  Cardiovascular: negative for chest pain, dyspnea on exertion, edema, orthopnea, palpitations, paroxysmal nocturnal dyspnea or shortness of breath Dermatological: negative for rash Respiratory: negative for cough or wheezing Urologic: negative for hematuria Abdominal: negative for nausea, vomiting, diarrhea, bright red blood per rectum, melena, or hematemesis Neurologic: negative for visual changes, syncope, or dizziness All other systems reviewed and are otherwise negative except as noted  above.    Blood pressure (!) 144/80, pulse 64, height _0  (1.803 m), weight 179 lb (81.2 kg).  General appearance: alert and no distress Neck: no adenopathy, no carotid bruit, no JVD, supple, symmetrical, trachea midline and thyroid not enlarged, symmetric, no tenderness/mass/nodules Lungs: clear to auscultation bilaterally Heart: regular rate and rhythm, S1, S2 normal, no murmur, click, rub or gallop Extremities: extremities normal, atraumatic, no cyanosis or edema Pulses: 2+ and symmetric Skin: Skin color, texture, turgor normal. No rashes or lesions Neurologic: Alert and oriented X 3, normal strength and tone. Normal symmetric reflexes. Normal coordination and gait  EKG paced rhythm at 64 with underlying atrial fibrillation. I personally reviewed this EKG.  ASSESSMENT AND PLAN:   Cardiomyopathy, ischemic History of ischemic cardiomyopathy with EF of 30% by 2-D echo performed at the  [facility] 02/17/[facility]. He has had an ICD placed by Dr. Lovena Le 05/21/16 for primary prevention.  HTN (hypertension) History of essential hypertension with blood pressure measures 144/80. Did show me some blood pressure measurements which tended to be on the high side. He is on carvedilol 12.5 mg by mouth twice a day in addition to lisinopril. I'm going to titrate his carvedilol to 25 mg by mouth twice a day.  CAD S/P multiple PCIs History of CAD status post multiple coronary interventions including stenting of his LAD, circumflex and marginal branch as well as RCA in the past. His most recent cardiac catheterization performed by myself 07/02/09 was remarkable for patent stents with 80% "in-stent restenosis within the obtuse marginal branch stent and 60% ostial RCA stenosis which I stented using a Taxus Liberte drug-eluting stent. He denies chest pain or shortness of breath.  Chronic atrial fibrillation (HCC) Chronic atrial fibrillation on Pradaxa oral anticoagulation.  Hyperlipidemia History of  hyperlipidemia on statin therapy followed by his PCP      Lorretta Harp MD [facility] 09/18/2017 9:46 AM

## 2017-09-18 NOTE — Patient Instructions (Signed)
Medication Instructions: Your physician recommends that you continue on your current medications as directed. Please refer to the Current Medication list given to you today.  Increase Carvedilol to 25 mg twice daily.   Follow-Up: Your physician wants you to follow-up in: 1 year with Dr. Gwenlyn Found. You will receive a reminder letter in the mail two months in advance. If you don't receive a letter, please call our office to schedule the follow-up appointment.  If you need a refill on your cardiac medications before your next appointment, please call your pharmacy.

## 2017-09-18 NOTE — Assessment & Plan Note (Signed)
History of essential hypertension with blood pressure measures 144/80. Did show me some blood pressure measurements which tended to be on the high side. He is on carvedilol 12.5 mg by mouth twice a day in addition to lisinopril. I'm going to titrate his carvedilol to 25 mg by mouth twice a day.

## 2017-09-18 NOTE — Assessment & Plan Note (Signed)
History of hyperlipidemia on statin therapy followed by his PCP 

## 2017-09-18 NOTE — Assessment & Plan Note (Signed)
Chronic atrial fibrillation on Pradaxa oral anticoagulation.

## 2017-09-18 NOTE — Assessment & Plan Note (Signed)
History of ischemic cardiomyopathy with EF of 30% by 2-D echo performed at the [facility] 02/17/16. He has had an ICD placed by Dr. Lovena Le 05/21/16 for primary prevention.

## 2017-10-23 ENCOUNTER — Other Ambulatory Visit: Payer: Self-pay | Admitting: Cardiovascular Disease

## 2017-10-23 DIAGNOSIS — I739 Peripheral vascular disease, unspecified: Secondary | ICD-10-CM

## 2017-10-24 ENCOUNTER — Ambulatory Visit (HOSPITAL_COMMUNITY)
Admission: RE | Admit: 2017-10-24 | Discharge: 2017-10-24 | Disposition: A | Discharge status: Home / Self Care | Payer: Medicare Other | Source: Ambulatory Visit | Attending: Cardiology | Admitting: Cardiology

## 2017-10-24 DIAGNOSIS — I739 Peripheral vascular disease, unspecified: Secondary | ICD-10-CM | POA: Insufficient documentation

## 2017-10-24 DIAGNOSIS — I10 Essential (primary) hypertension: Secondary | ICD-10-CM | POA: Diagnosis not present

## 2017-10-24 DIAGNOSIS — I2510 Atherosclerotic heart disease of native coronary artery without angina pectoris: Secondary | ICD-10-CM | POA: Insufficient documentation

## 2017-10-24 DIAGNOSIS — Z87891 Personal history of nicotine dependence: Secondary | ICD-10-CM | POA: Diagnosis not present

## 2017-10-24 DIAGNOSIS — E785 Hyperlipidemia, unspecified: Secondary | ICD-10-CM | POA: Insufficient documentation

## 2017-10-30 ENCOUNTER — Other Ambulatory Visit: Payer: Self-pay | Admitting: Cardiovascular Disease

## 2017-10-30 DIAGNOSIS — I739 Peripheral vascular disease, unspecified: Secondary | ICD-10-CM

## 2017-10-31 ENCOUNTER — Telehealth: Payer: Self-pay | Admitting: Cardiovascular Disease

## 2017-10-31 NOTE — Telephone Encounter (Signed)
   [facility] Pre-operative Risk Assessment    Request for surgical clearance:  1. What type of surgery is being performed? Upper endoscopy @ [facility]  2. When is this surgery scheduled? January 08, 2018   3. What type of clearance is required (medical clearance vs. Pharmacy clearance to hold med vs. Both)? Both   4. Are there any medications that need to be held prior to surgery and how long? Pradaxa - 3 days   5. Practice name and name of physician performing surgery? Denice Paradise NP [facility]    6. What is your office phone number [phone])    7.   What is your office fax number [phone])  8.   Anesthesia type (None, local, MAC, general) ?  Not specified    Fidel Levy 10/31/2017, 2:38 PM  _________________________________________________________________   (provider comments below)

## 2017-10-31 NOTE — Telephone Encounter (Signed)
   Primary Cardiologist: Dr. Gwenlyn Found  Chart reviewed as part of pre-operative protocol coverage. Given past medical history and time since last visit, based on ACC/AHA guidelines, [name] would be at acceptable risk for the planned procedure without further cardiovascular testing.   I will route encounter to pharmacy to address Pradaxa.   Please call with questions.  Lyda Jester, PA-C 10/31/2017, 2:56 PM

## 2017-10-31 NOTE — Telephone Encounter (Signed)
Pt takes Pradaxa for afib with CHADS2VASc score of 5 (age x2, HTN, CAD, CHF). Had hx of PE in 2011, provoked after TKA. Pt was started on Coumadin which was discontinued in Sept 2011. Renal function is normal with CrCl 91. Recommend holding Pradaxa 2 days rather than 3 days prior to endoscopy.

## 2017-11-26 ENCOUNTER — Telehealth: Payer: Self-pay | Admitting: Cardiology

## 2017-11-26 NOTE — Telephone Encounter (Signed)
Pt called and wanted to know if he could use a hand drill w/ a lithium battery. After consulting w/ the Device Tech RN. I informed pt that it is ok to use the drill as long as he keep the drill at least 6 inches from his device at all times. Pt verbalized understanding.

## 2017-12-04 ENCOUNTER — Ambulatory Visit (INDEPENDENT_AMBULATORY_CARE_PROVIDER_SITE_OTHER): Payer: Medicare Other | Admitting: *Deleted

## 2017-12-04 DIAGNOSIS — I471 Supraventricular tachycardia: Secondary | ICD-10-CM

## 2017-12-04 DIAGNOSIS — I255 Ischemic cardiomyopathy: Secondary | ICD-10-CM | POA: Diagnosis not present

## 2017-12-04 NOTE — Progress Notes (Signed)
Remote ICD transmission.   

## 2017-12-05 LAB — CUP PACEART REMOTE DEVICE CHECK
Battery Remaining Percentage: 100 %
Brady Statistic RA Percent Paced: 0 %
Brady Statistic RV Percent Paced: 3 %
HIGH POWER IMPEDANCE MEASURED VALUE: 52 Ohm
Implantable Lead Implant Date: 20171106
Implantable Lead Model: 293
Implantable Lead Model: 7741
Implantable Lead Serial Number: 828526
Lead Channel Impedance Value: 423 Ohm
Lead Channel Pacing Threshold Amplitude: 0.8 V
Lead Channel Setting Pacing Amplitude: 2 V
Lead Channel Setting Pacing Amplitude: 2.5 V
Lead Channel Setting Pacing Pulse Width: 0.4 ms
MDC IDC LEAD IMPLANT DT: 20171106
MDC IDC LEAD LOCATION: 753859
MDC IDC LEAD LOCATION: 753860
MDC IDC LEAD SERIAL: 422759
MDC IDC MSMT BATTERY REMAINING LONGEVITY: 126 mo
MDC IDC MSMT LEADCHNL RA IMPEDANCE VALUE: 631 Ohm
MDC IDC MSMT LEADCHNL RV PACING THRESHOLD PULSEWIDTH: 0.4 ms
MDC IDC PG IMPLANT DT: 20171106
MDC IDC PG SERIAL: 519130
MDC IDC SESS DTM: 20190522090200
MDC IDC SET LEADCHNL RV SENSING SENSITIVITY: 0.5 mV

## 2017-12-25 ENCOUNTER — Emergency Department: Payer: Medicare Other

## 2017-12-25 ENCOUNTER — Emergency Department
Admission: EM | Admit: 2017-12-25 | Discharge: 2017-12-25 | Disposition: A | Discharge status: Home / Self Care | Payer: Medicare Other | Attending: Emergency Medicine | Admitting: Emergency Medicine

## 2017-12-25 ENCOUNTER — Other Ambulatory Visit: Payer: Self-pay

## 2017-12-25 DIAGNOSIS — I509 Heart failure, unspecified: Secondary | ICD-10-CM | POA: Insufficient documentation

## 2017-12-25 DIAGNOSIS — I77811 Abdominal aortic ectasia: Secondary | ICD-10-CM | POA: Diagnosis not present

## 2017-12-25 DIAGNOSIS — Z79899 Other long term (current) drug therapy: Secondary | ICD-10-CM | POA: Insufficient documentation

## 2017-12-25 DIAGNOSIS — Z95810 Presence of automatic (implantable) cardiac defibrillator: Secondary | ICD-10-CM | POA: Diagnosis not present

## 2017-12-25 DIAGNOSIS — S2232XA Fracture of one rib, left side, initial encounter for closed fracture: Secondary | ICD-10-CM | POA: Insufficient documentation

## 2017-12-25 DIAGNOSIS — I2510 Atherosclerotic heart disease of native coronary artery without angina pectoris: Secondary | ICD-10-CM | POA: Insufficient documentation

## 2017-12-25 DIAGNOSIS — Y998 Other external cause status: Secondary | ICD-10-CM | POA: Insufficient documentation

## 2017-12-25 DIAGNOSIS — I110 Hypertensive heart disease with heart failure: Secondary | ICD-10-CM | POA: Insufficient documentation

## 2017-12-25 DIAGNOSIS — Z8673 Personal history of transient ischemic attack (TIA), and cerebral infarction without residual deficits: Secondary | ICD-10-CM | POA: Diagnosis not present

## 2017-12-25 DIAGNOSIS — Z96652 Presence of left artificial knee joint: Secondary | ICD-10-CM | POA: Insufficient documentation

## 2017-12-25 DIAGNOSIS — W11XXXA Fall on and from ladder, initial encounter: Secondary | ICD-10-CM | POA: Diagnosis not present

## 2017-12-25 DIAGNOSIS — Y929 Unspecified place or not applicable: Secondary | ICD-10-CM | POA: Diagnosis not present

## 2017-12-25 DIAGNOSIS — Z87891 Personal history of nicotine dependence: Secondary | ICD-10-CM | POA: Insufficient documentation

## 2017-12-25 DIAGNOSIS — S299XXA Unspecified injury of thorax, initial encounter: Secondary | ICD-10-CM | POA: Diagnosis present

## 2017-12-25 DIAGNOSIS — Z955 Presence of coronary angioplasty implant and graft: Secondary | ICD-10-CM | POA: Diagnosis not present

## 2017-12-25 DIAGNOSIS — Z85828 Personal history of other malignant neoplasm of skin: Secondary | ICD-10-CM | POA: Insufficient documentation

## 2017-12-25 DIAGNOSIS — S0990XA Unspecified injury of head, initial encounter: Secondary | ICD-10-CM | POA: Insufficient documentation

## 2017-12-25 DIAGNOSIS — Z7982 Long term (current) use of aspirin: Secondary | ICD-10-CM | POA: Diagnosis not present

## 2017-12-25 DIAGNOSIS — Y9389 Activity, other specified: Secondary | ICD-10-CM | POA: Insufficient documentation

## 2017-12-25 DIAGNOSIS — R1032 Left lower quadrant pain: Secondary | ICD-10-CM | POA: Insufficient documentation

## 2017-12-25 DIAGNOSIS — W19XXXA Unspecified fall, initial encounter: Secondary | ICD-10-CM

## 2017-12-25 MED ORDER — ACETAMINOPHEN 500 MG PO TABS
1000.0000 mg | ORAL_TABLET | Freq: Once | ORAL | Status: DC
  Filled 2017-12-25: qty 2

## 2017-12-25 MED ORDER — IOHEXOL 300 MG/ML  SOLN
100.0000 mL | Freq: Once | INTRAMUSCULAR | Status: AC | PRN
  Administered 2017-12-25: 100 mL via INTRAVENOUS

## 2017-12-25 NOTE — ED Notes (Signed)
Patient transported to CT 

## 2017-12-25 NOTE — Discharge Instructions (Addendum)
You were found to have a left 12th rib fracture.  Management for this is just pain control.  You can take Tylenol 1000 mg (2 extra strength) 3 times a day as needed for pain.  Your CT also found that your abdominal aorta is slightly enlarged.  This is something that needs to be followed up.  Make sure to discuss this with your primary care doctor so is appropriately followed up.  Return to the emergency room if you have headache, neck pain, back pain, abdominal pain, dizziness, or any other symptoms concerning to you.

## 2017-12-25 NOTE — ED Provider Notes (Signed)
[facility] Emergency Department Provider Note  ____________________________________________  Time seen: Approximately 4:14 PM  I have reviewed the triage vital signs and the nursing notes.   HISTORY  Chief Complaint Fall   HPI [name] is a 77 y.o. male with complex past medical history as listed below on Pradaxa who presents for evaluation of back pain status post mechanical fall.  Patient reports that he fell from a 6 foot ladder just prior to arrival.  Patient reports that he had just cut a branch from a tree and was coming down when the ladder twisted and patient fell on his back.  He is complaining of pain in his left flank which is present since the fall and mild.  He reports that the pain is worse with movement of the torso and he resolves when he is not moving.  He reports head trauma but denies LOC.  No headache, no neck pain, no midline back pain, no extremity pain, no chest pain or abdominal pain.  Past Medical History:  Diagnosis Date  . A-fib ([place])   . AICD (automatic cardioverter/defibrillator) present 05/21/2016  . Arthritis    "back of my neck extending to back of head" (05/21/2016)  . Barrett's esophagus    Gets periodic endoscopies & biopsies. Coumadin has been held in the past for these precedures.   . Basal cell carcinoma    "scalp; chest; back"  . Blurred vision   . CAD (coronary artery disease)    S/P stenting of his LAD, circumflex & marginal branch as well as his RCA in 2008.  . Cardiomyopathy ([place])    Significant ischemic cardiomyopathy. ECHO 08/27/12: EF = 45-50% (Previous Echo revealed EF = 25%)  . Carotid artery occlusion    Carotid Doppler 08/27/12 SUMMARY - Bilateral ICAs: Demonstrated normal patency without evidence of a significant diamenter reduction, tortuosity or any other vascular abnormality. This was a Normal carotid duplex Doppler Evaluation.  . Cerebral atherosclerosis    Carotid Doppler 08/27/12 SUMMARY -  Bilateral ICAs: Demonstrated normal patency without evidence of a significant diamenter reduction, tortuosity or any other vascular abnormality. This was a Normal carotid duplex Doppler Evaluation.  . CHF (congestive heart failure) ([place])   . Chronic anticoagulation    For PE & PAF  . Claudication ([place])    right ABI of 0.84  . Coronary artery disease    Most recent cath by Dr. Gwenlyn Found 07/02/09 was remarkable for patent stents w/an 80% in-stent stenosis within the obtuse marginal branch stent, as well as 60% ostial RCA stenosis which was stented with a Taxus Liberte drug-eluting stent. Nuc stress test 09/03/12 was low risk & showed an inferolateral scar. & an EF of 39%  . Daily headache    "from the arthritis in back of neck" (05/21/2016)  . DVT (deep venous thrombosis) ([place]) 09/2010   "after knee replacement"  . GERD (gastroesophageal reflux disease)   . H/O mitral valve disorder   . History of hiatal hernia   . History of kidney stones   . History of stomach ulcers   . Hyperlipidemia   . Hypertension   . Meningioma ([place]) 08/15/12   BrainLAB-guided left frontoparietal craniotomy w/removal of a meningioma.   . Obstructive sleep apnea    Sleep Study in 2011 AHI 3.2. "occasionally wear my mask" (05/21/2016)  . Paroxysmal atrial fibrillation ([facility])   . PSVT (paroxysmal supraventricular tachycardia) ([place])    2D ECHO 08/27/12: EF = 45-50% (Previous Echo revealed EF =  25%)  . Pulmonary embolism ([place]) 09/2010   "after knee replacement"  . Shortness of breath dyspnea   . Sick sinus syndrome ([place])   . TIA (transient ischemic attack)     Patient Active Problem List   Diagnosis Date Noted  . ICD (implantable cardioverter-defibrillator) in place 05/21/2016  . 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 Claudication of right lower extremity ([place]) 10/04/2015  . Preop cardiovascular exam 11/16/2014  . Palpitations 08/13/2013  . Chronic atrial fibrillation ([place]) 03/26/2013  . Hyperlipidemia 03/26/2013  .  Barrett's esophagus 03/26/2013  . Obstructive sleep apnea 03/26/2013  . Cardiomyopathy, ischemic 12/19/2012  . HTN (hypertension) 12/19/2012  . Hx pulmonary embolism 12/19/2012  . NSVT (nonsustained ventricular tachycardia) ([place]) 10/23/2012  . CAD S/P multiple PCIs 10/23/2012  . PSVT (paroxysmal supraventricular tachycardia) ([place]) 09/30/2012  . Long term (current) use of anticoagulants 09/30/2012    Past Surgical History:  Procedure Laterality Date  . BASAL CELL CARCINOMA EXCISION     "head X 2; back, chest" (05/21/2016)  . CARDIAC CATHETERIZATION     Past caths in 2008, 2006, 2005 & 2003  . CATARACT EXTRACTION W/ INTRAOCULAR LENS  IMPLANT, BILATERAL Bilateral 2005  . CORONARY ANGIOPLASTY WITH STENT PLACEMENT  07/02/09   Most recent cath by Dr. Gwenlyn Found 07/02/09 was remarkable for patent stents w/an 80% in-stent stenosis within the obtuse marginal branch stent, as well as 60% ostial RCA stenosis which was stented with a Taxus Liberte drug-eluting stent.   . CORONARY ANGIOPLASTY WITH STENT PLACEMENT     "I have 12 stents in my heart" (05/21/2016)  . CRANIOTOMY  08/15/2012   At [facility]. BrainLAB-guided left frontoparietal craniotomy w/removal of a meningioma.   . EP IMPLANTABLE DEVICE N/A 05/21/2016   Procedure: ICD Implant;  Surgeon: Evans Lance, MD;  Location: [facility];  Service: Cardiovascular;  Laterality: N/A;  . INGUINAL HERNIA REPAIR Right 11/23/2014   Procedure: HERNIA REPAIR INGUINAL ADULT;  Surgeon: Rochel Brome, MD;  Location: [facility] ORS;  Service: General;  Laterality: Right;  . INGUINAL HERNIA REPAIR Bilateral 2010-2016  . JOINT REPLACEMENT    . THROAT SURGERY     "laser for Barret's esophagus"  . TONSILLECTOMY    . TOTAL KNEE ARTHROPLASTY Left 09/2010   Surgery was complicated by PAF with a pulmonary embolism documented by CT angiogram.    Prior to Admission medications   Medication Sig Start Date End Date Taking? Authorizing Provider  acetaminophen (TYLENOL) 325  MG tablet Take 650 mg by mouth every 6 (six) hours as needed (for pain.).    [provider]  aspirin EC 81 MG tablet Take 81 mg by mouth daily.    [provider]  atorvastatin (LIPITOR) 80 MG tablet Take 80 mg by mouth every evening.     [provider]  calcium-vitamin D (OSCAL WITH D) 500-200 MG-UNIT per tablet Take 1 tablet by mouth 2 (two) times daily.     [provider]  carvedilol (COREG) 25 MG tablet Take 1 tablet (25 mg total) by mouth 2 (two) times daily with a meal. 09/18/17   Lorretta Harp, MD  dabigatran (PRADAXA) 150 MG CAPS capsule Take 150 mg by mouth every 12 (twelve) hours.    [provider]  lisinopril (PRINIVIL,ZESTRIL) 20 MG tablet Take 10 mg by mouth every evening.     [provider]  Multiple Vitamins-Minerals (MULTIVITAMIN WITH MINERALS) tablet Take 1 tablet by mouth daily.    [provider]  omeprazole (PRILOSEC) 20 MG capsule Take 40 mg by mouth 2 (two) times daily.     [provider]  polyvinyl alcohol (LIQUIFILM TEARS) 1.4 % ophthalmic solution Place 1 drop into both eyes daily.    [provider]    Allergies Patient has no known allergies.  Family History  Problem Relation Age of Onset  . Cancer Mother   . Cardiomyopathy Sister   . Cancer Other        Breast    Social History Social History   Tobacco Use  . Smoking status: Former Smoker    Packs/day: 1.00    Years: 35.00    Pack years: 35.00    Types: Cigarettes    Last attempt to quit: 1980    Years since quitting: 39.4  . Smokeless tobacco: Former Systems developer    Quit date: 1989  Substance Use Topics  . Alcohol use: Yes    Alcohol/week: 16.8 oz    Types: 28 Cans of beer per week    Comment: 4 beers per day  . Drug use: No    Review of Systems Constitutional: Negative for fever. Eyes: Negative for visual changes. ENT: Negative for facial injury or neck injury Cardiovascular: Negative for chest  injury. Respiratory: Negative for shortness of breath. Negative for chest wall injury. Gastrointestinal: Negative for abdominal pain or injury. Genitourinary: Negative for dysuria. Musculoskeletal: + back injury, negative for arm or leg pain. Skin: Negative for laceration/abrasions. Neurological: + head injury.   ____________________________________________   PHYSICAL EXAM:  VITAL SIGNS: ED Triage Vitals  Enc Vitals Group     BP 12/25/17 1509 (!) 167/88     Pulse Rate 12/25/17 1509 64     Resp 12/25/17 1509 20     Temp 12/25/17 1509 97.9 F (36.6 C)     Temp Source 12/25/17 1509 Oral     SpO2 12/25/17 1509 95 %     Weight 12/25/17 1510 179 lb (81.2 kg)     Height 12/25/17 1510 5\' 11"  (1.803 m)     Head Circumference --      Peak Flow --      Pain Score 12/25/17 1514 1     Pain Loc --      Pain Edu? --      Excl. in [place]? --    Full spinal precautions maintained throughout the trauma exam. Constitutional: Alert and oriented. No acute distress. Does not appear intoxicated. HEENT Head: Normocephalic and atraumatic. Face: No facial bony tenderness. Stable midface Ears: No hemotympanum bilaterally. No Battle sign Eyes: No eye injury. PERRL. No raccoon eyes Nose: Nontender. No epistaxis. No rhinorrhea Mouth/Throat: Mucous membranes are moist. No oropharyngeal blood. No dental injury. Airway patent without stridor. Normal voice. Neck: C-collar in place. No midline c-spine tenderness.  Cardiovascular: Normal rate, regular rhythm. Normal and symmetric distal pulses are present in all extremities. Pulmonary/Chest: Chest wall is stable and nontender to palpation/compression. Normal respiratory effort. Breath sounds are normal. No crepitus.  Abdominal: Soft, nontender, non distended. Back: bruising to bilateral flank with ttp over the L flank Musculoskeletal: Nontender with normal full range of motion in all extremities. No deformities. No thoracic or lumbar midline spinal  tenderness. Pelvis is stable. Skin: Skin is warm, dry and intact. No abrasions or contutions. Psychiatric: Speech and behavior are appropriate. Neurological: Normal speech and language. Moves all extremities to command. No gross focal neurologic deficits are appreciated.  Glascow Coma Score: 4 - Opens eyes on own 6 - Follows  simple motor commands 5 - Alert and oriented GCS: 15  ____________________________________________   LABS (all labs ordered are listed, but only abnormal results are displayed)  Labs Reviewed - No data to display ____________________________________________  EKG  none  ____________________________________________  RADIOLOGY  I have personally reviewed the images performed during this visit and I agree with the Radiologist's read.   Interpretation by Radiologist:  Ct Head Wo Contrast  Result Date: 12/25/2017 CLINICAL DATA:  [place] 6 foot off a ladder onto tree roots directly onto back, mid LEFT back pain, struck posterior head on ground, denies loss of consciousness, history meningioma post excision, atrial fibrillation, coronary artery disease post stenting, cardiomyopathy, CHF, hypertension, former smoker EXAM: CT HEAD WITHOUT CONTRAST CT CERVICAL SPINE WITHOUT CONTRAST TECHNIQUE: Multidetector CT imaging of the head and cervical spine was performed following the standard protocol without intravenous contrast. Multiplanar CT image reconstructions of the cervical spine were also generated. COMPARISON:  CT head 08/01/2015 FINDINGS: CT HEAD FINDINGS Brain: Generalized atrophy. Normal ventricular morphology. No midline shift or mass effect. Otherwise normal appearance of brain parenchyma. No intracranial hemorrhage, mass lesion, or evidence of acute infarction. No extra-axial fluid collections. Vascular: Atherosclerotic calcification of internal carotid and vertebral arteries at skull base Skull: Prior posterior LEFT parietal craniotomy. Calvaria otherwise intact.  Sinuses/Orbits: Visualized paranasal sinuses and and mastoid air cells clear Other: N/A CT CERVICAL SPINE FINDINGS Alignment: Normal Skull base and vertebrae: Osseous mineralization grossly normal. Skull base intact. Vertebral body heights maintained. Disc space narrowing and endplate spur formation at C6-C7, less at C5-C6. No acute fracture, subluxation, or bone destruction. Multilevel facet degenerative changes bilaterally. Soft tissues and spinal canal: Prevertebral soft tissues normal thickness. Cervical soft tissues otherwise normal appearance. Incidentally noted atherosclerotic calcifications of the internal carotid and common carotid arteries with extension of the RIGHT internal carotid artery retropharyngeal. Disc levels:  No additional abnormalities Upper chest: Lung apices clear Other: N/A IMPRESSION: Mild generalized atrophy. No acute intracranial abnormalities. Multilevel degenerative disc and facet disease changes of the cervical spine. New no acute cervical spine abnormalities. Electronically Signed   By: Lavonia Dana M.D.   On: 12/25/2017 15:41   Ct Cervical Spine Wo Contrast  Result Date: 12/25/2017 CLINICAL DATA:  [place] 6 foot off a ladder onto tree roots directly onto back, mid LEFT back pain, struck posterior head on ground, denies loss of consciousness, history meningioma post excision, atrial fibrillation, coronary artery disease post stenting, cardiomyopathy, CHF, hypertension, former smoker EXAM: CT HEAD WITHOUT CONTRAST CT CERVICAL SPINE WITHOUT CONTRAST TECHNIQUE: Multidetector CT imaging of the head and cervical spine was performed following the standard protocol without intravenous contrast. Multiplanar CT image reconstructions of the cervical spine were also generated. COMPARISON:  CT head 08/01/2015 FINDINGS: CT HEAD FINDINGS Brain: Generalized atrophy. Normal ventricular morphology. No midline shift or mass effect. Otherwise normal appearance of brain parenchyma. No intracranial  hemorrhage, mass lesion, or evidence of acute infarction. No extra-axial fluid collections. Vascular: Atherosclerotic calcification of internal carotid and vertebral arteries at skull base Skull: Prior posterior LEFT parietal craniotomy. Calvaria otherwise intact. Sinuses/Orbits: Visualized paranasal sinuses and and mastoid air cells clear Other: N/A CT CERVICAL SPINE FINDINGS Alignment: Normal Skull base and vertebrae: Osseous mineralization grossly normal. Skull base intact. Vertebral body heights maintained. Disc space narrowing and endplate spur formation at C6-C7, less at C5-C6. No acute fracture, subluxation, or bone destruction. Multilevel facet degenerative changes bilaterally. Soft tissues and spinal canal: Prevertebral soft tissues normal thickness. Cervical soft tissues otherwise normal appearance. Incidentally  noted atherosclerotic calcifications of the internal carotid and common carotid arteries with extension of the RIGHT internal carotid artery retropharyngeal. Disc levels:  No additional abnormalities Upper chest: Lung apices clear Other: N/A IMPRESSION: Mild generalized atrophy. No acute intracranial abnormalities. Multilevel degenerative disc and facet disease changes of the cervical spine. New no acute cervical spine abnormalities. Electronically Signed   By: Lavonia Dana M.D.   On: 12/25/2017 15:41   Ct Abdomen Pelvis W Contrast  Result Date: 12/25/2017 CLINICAL DATA:  Initial evaluation for acute trauma, fall off ladder. Left flank pain. EXAM: CT ABDOMEN AND PELVIS WITH CONTRAST TECHNIQUE: Multidetector CT imaging of the abdomen and pelvis was performed using the standard protocol following bolus administration of intravenous contrast. CONTRAST:  198mL OMNIPAQUE IOHEXOL 300 MG/ML  SOLN COMPARISON:  None. FINDINGS: Lower chest: Small layering left pleural effusion. Mild bibasilar atelectasis. Scattered calcified pleural plaques at the lung base. Cardiomegaly partially visualized. Cardiac  pacemaker electrodes noted. Fat containing Bochdalek's hernia present at the left lung base. Hepatobiliary: Liver demonstrates a normal contrast enhanced appearance. Gallbladder contracted. Faint calcification within the gallbladder lumen may reflect a small stone versus mural calcification (series 2, image 28). Mild hazy pericholecystic fat stranding, indeterminate. No biliary dilatation. Pancreas: Pancreas within normal limits. Spleen: Spleen intact. Adrenals/Urinary Tract: Adrenal glands are normal. Kidneys equal in size with symmetric enhancement. 2.8 cm cyst noted at the upper pole of the left kidney. No nephrolithiasis, hydronephrosis, or focal enhancing renal mass. No evidence for acute renal injury. No hydronephrosis. Partially distended bladder within normal limits. Stomach/Bowel: Small hiatal hernia noted. Stomach otherwise unremarkable. No evidence for bowel obstruction or acute bowel injury. Normal appendix. Colonic diverticulosis without evidence for acute diverticulitis. Moderate retained stool within the colon. Vascular/Lymphatic: Advanced aorto bi-iliac atherosclerotic disease. Intra-abdominal aorta ectatic measuring up to 2.5 cm. Mesenteric vessels patent proximally. No adenopathy. Reproductive: Prostate within normal limits. Other: Sequelae of prior left inguinal hernia repair. No free air or fluid. No mesenteric or retroperitoneal hematoma. Musculoskeletal: Acute minimally displaced fracture of the left posterior eleventh rib. Mild overlying soft tissue swelling/contusion within the subcutaneous fat of the left flank. No other acute osseous abnormality. No worrisome lytic or blastic osseous lesions. Trace anterolisthesis of L4 on L5 with advanced facet arthropathy. IMPRESSION: 1. Acute minimally displaced left posterior eleventh rib fracture with mild soft tissue contusion within the subcutaneous fat of the overlying left flank. 2. Small layering left pleural effusion. Scattered calcified pleural  plaques at the lung bases. 3. Cholelithiasis with hazy pericholecystic fat stranding. Findings at specific, but could reflect sequelae of acute cholecystitis. Correlation with LFTs suggested. Additionally, further CT evaluation with dedicated right upper quadrant ultrasound could be performed for further evaluation as clinically warranted. 4. Colonic diverticulosis without evidence for acute diverticulitis. 5. **An incidental finding of potential clinical significance has been found. 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 Recommend followup by ultrasound in 5 years. This recommendation follows ACR consensus guidelines: White Paper of the ACR Incidental Findings Committee II on Vascular Findings. J Am Coll Radiol 2013; 10:789-794.** Electronically Signed   By: Jeannine Boga M.D.   On: 12/25/2017 17:25     ____________________________________________   PROCEDURES  Procedure(s) performed: None Procedures Critical Care performed:  None ____________________________________________   INITIAL IMPRESSION / ASSESSMENT AND PLAN / ED COURSE  77 y.o. male with complex past medical history on Pradaxa who presents for evaluation of back pain status post mechanical fall.  Patient had a head  trauma on Pradaxa with a negative CT head and cervical spine.  C-collar was cleared after CT scan.  Patient has bruising to bilateral flank and pain on the left flank.  Since he is on blood thinners a CT abdomen pelvis has been ordered to rule out intra-abdominal injury/bleeding.    _________________________ 5:54 PM on 12/25/2017 -----------------------------------------  CT showed a left 11th rib fracture with a soft tissue contusion.  No intra-abdominal or retroperitoneal injury.  [name] pain remains minimal.  CT also had an incidental finding of an ectatic abdominal aorta measuring 2.5 cm.  This finding was discussed with patient and recommendation for outpatient  follow-up.  Patient was given return precautions and will be discharged home at this time.   As part of my medical decision making, I reviewed the following data within the [place] notes reviewed and incorporated, Radiograph reviewed , Notes from prior ED visits and [place] Controlled Substance Database    Pertinent labs & imaging results that were available during my care of the patient were reviewed by me and considered in my medical decision making (see chart for details).    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    Alfred Levins, [place], MD 12/25/17 1755

## 2017-12-25 NOTE — ED Notes (Signed)
Spoke with Dr. Cherylann Banas about patient. Orders received. Placed in c collar.

## 2017-12-25 NOTE — ED Notes (Signed)
Pt returned from CT at this time, back to the lobby.

## 2017-12-25 NOTE — ED Triage Notes (Addendum)
Pt states that he fell 91ft off of ladder onto tree roots and fell directly onto back without any break in fall. States left mid back pain since. Did hit his posterior head on ground, denies LOC. Denies neck pain. Posterior upper left arm pain, left hand pain. No obvious deformity. Pt ambulatory by himself after fall.  Pt alert and oriented X4, active, cooperative, pt in NAD. RR even and unlabored, color WNL.

## 2017-12-26 LAB — POCT I-STAT CREATININE: Creatinine, Ser: 0.7 mg/dL (ref 0.61–1.24)

## 2018-01-03 ENCOUNTER — Ambulatory Visit: Admit: 2018-01-03 | Payer: Medicare Other | Admitting: Unknown Physician Specialty

## 2018-01-03 SURGERY — ESOPHAGOGASTRODUODENOSCOPY (EGD) WITH PROPOFOL
Anesthesia: General

## 2018-01-15 ENCOUNTER — Encounter: Payer: Self-pay | Admitting: Cardiovascular Disease

## 2018-01-15 ENCOUNTER — Ambulatory Visit (INDEPENDENT_AMBULATORY_CARE_PROVIDER_SITE_OTHER): Payer: Medicare Other | Admitting: Cardiovascular Disease

## 2018-01-15 VITALS — BP 122/69 | HR 70 | Ht 70.0 in | Wt 171.0 lb

## 2018-01-15 DIAGNOSIS — I482 Chronic atrial fibrillation, unspecified: Secondary | ICD-10-CM

## 2018-01-15 DIAGNOSIS — Z95810 Presence of automatic (implantable) cardiac defibrillator: Secondary | ICD-10-CM

## 2018-01-15 DIAGNOSIS — E7800 Pure hypercholesterolemia, unspecified: Secondary | ICD-10-CM

## 2018-01-15 DIAGNOSIS — I255 Ischemic cardiomyopathy: Secondary | ICD-10-CM

## 2018-01-15 NOTE — Progress Notes (Signed)
01/15/2018 [name]   1941/02/23  [id number]  Primary Physician Morayati, Lourdes Sledge, MD Primary Cardiologist: Lorretta Harp MD FACP, [place], [place], [place]  HPI:  [name] is a 77 y.o.  hin and fit-appearing Caucasian male who I last saw in the office  09/19/2015.[name] He is a father of 2, grandfather to 2 grandchildren.  He is accompanied by his girlfriend [name] today.[name] He has history of CAD status post stenting of his LAD, circumflex and marginal branch as well as his RCA in the past. His most recent cath performed by myself July 02, 2009, was remarkable for patent stents with an 80% in-stent restenosis within the obtuse marginal branch stent, as well as a 60% ostial RCA stenosis which I stented using a Taxus Liberte drug-eluting stent. His other problems include obstructive sleep apnea, hypertension and hyperlipidemia. 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 He has Barrett esophagus and gets periodic endoscopies and biopsies and because of this I have stopped his Coumadin in the past. When I saw him, he denied chest pain or shortness of breath but was complaining of occasional blurred vision. He had a Myoview stress test performed that was low risk and showed inferolateral scar. He had a 2D echo that revealed an EF of 45% to 50% similar to a prior echo. Carotid Dopplers were unremarkable. He did have an event monitor that showed evidence of PAF as well as nonsustained ventricular tachycardia with a 26-beat run of monomorphic VT during which he was asymptomatic.he is fairly active and works out 6 days a week. He denies chest pain or shortness of breath. He saw Dr. Stanford Breed in the office 11/16/14 for preoperative clearance before elective right inguinal hernia repair. This was performed without complication. No functional study was ordered prior to that. He had a  2-D echocardiogram performed after [facility] on 02/17/16 which showed an EF of 30%..Based on this he met criteria for ICD implantation for primary prevention which was ultimately performed by Dr. Lovena Le 05/21/16 uneventfully. The site has healed. It is under his left pectoral muscle. He still works out often without limitation. He remains on Pradaxa oral anticoagulation for his chronic A. fib and history of pulmonary embolism. Since I saw him a year ago he's remained stable. He continues to be active and works out in the gym 4 hours a day on a treadmill and elliptical for 30 minutes a day each as well as doing pushups in pull-ups and lifting weights without limitation. Complain of some mild increase in shortness of breath although this is been noticeable only during the recent hot weather.  He otherwise denies chest pain. He did fall off a ladder and injured himself recently.  On abdominal CT showed a very small abdominal aortic aneurysm measuring only 2.5 cm.   Current Meds  Medication Sig  . acetaminophen (TYLENOL) 325 MG tablet Take 650 mg by mouth every 6 (six) hours as needed (for pain.).  [name] aspirin EC 81 MG tablet Take 81 mg by mouth daily.  [name] atorvastatin (LIPITOR) 80 MG tablet Take 80 mg by mouth every evening.   . calcium-vitamin D (OSCAL WITH D) 500-200 MG-UNIT per tablet Take 1 tablet by mouth 2 (two) times daily.   . carvedilol (COREG) 25 MG tablet Take 1 tablet (25 mg total) by mouth 2 (two)  times daily with a meal.  . dabigatran (PRADAXA) 150 MG CAPS capsule Take 150 mg by mouth every 12 (twelve) hours.  [name] lisinopril (PRINIVIL,ZESTRIL) 20 MG tablet Take 10 mg by mouth every evening.   . Multiple Vitamins-Minerals (MULTIVITAMIN WITH MINERALS) tablet Take 1 tablet by mouth daily.  [name] omeprazole (PRILOSEC) 20 MG capsule Take 40 mg by mouth 2 (two) times daily.   . polyvinyl alcohol (LIQUIFILM TEARS) 1.4 % ophthalmic solution Place 1 drop into both eyes daily.  . vitamin B-12  (CYANOCOBALAMIN) 1000 MCG tablet Take 1,000 mcg by mouth daily.     No Known Allergies  Social History   Socioeconomic History  . Marital status: Widowed    Spouse name: Not on file  . Number of children: Not on file  . Years of education: Not on file  . Highest education level: Not on file  Occupational History  . Not on file  Social Needs  . Financial resource strain: Not on file  . Food insecurity:    Worry: Not on file    Inability: Not on file  . Transportation needs:    Medical: Not on file    Non-medical: Not on file  Tobacco Use  . Smoking status: Former Smoker    Packs/day: 1.00    Years: 35.00    Pack years: 35.00    Types: Cigarettes    Last attempt to quit: 1980    Years since quitting: 39.5  . Smokeless tobacco: Former Systems developer    Quit date: 1989  Substance and Sexual Activity  . Alcohol use: Yes    Alcohol/week: 16.8 oz    Types: 28 Cans of beer per week    Comment: 4 beers per day  . Drug use: No  . Sexual activity: Yes  Lifestyle  . Physical activity:    Days per week: Not on file    Minutes per session: Not on file  . Stress: Not on file  Relationships  . Social connections:    Talks on phone: Not on file    Gets together: Not on file    Attends religious service: Not on file    Active member of club or organization: Not on file    Attends meetings of clubs or organizations: Not on file    Relationship status: Not on file  . Intimate partner violence:    Fear of current or ex partner: Not on file    Emotionally abused: Not on file    Physically abused: Not on file    Forced sexual activity: Not on file  Other Topics Concern  . Not on file  Social History Narrative  . Not on file     Review of Systems: General: negative for chills, fever, night sweats or weight changes.  Cardiovascular: negative for chest pain, dyspnea on exertion, edema, orthopnea, palpitations, paroxysmal nocturnal dyspnea or shortness of breath Dermatological: negative  for rash Respiratory: negative for cough or wheezing Urologic: negative for hematuria Abdominal: negative for nausea, vomiting, diarrhea, bright red blood per rectum, melena, or hematemesis Neurologic: negative for visual changes, syncope, or dizziness All other systems reviewed and are otherwise negative except as noted above.    Blood pressure 122/69, pulse 70, height '5\' 10"'  (1.778 m), weight 171 lb (77.6 kg).  General appearance: alert and no distress Neck: no adenopathy, no carotid bruit, no JVD, supple, symmetrical, trachea midline and thyroid not enlarged, symmetric, no tenderness/mass/nodules Lungs: clear to auscultation bilaterally Heart: irregularly irregular rhythm Extremities:  extremities normal, atraumatic, no cyanosis or edema Pulses: 2+ and symmetric Skin: Skin color, texture, turgor normal. No rashes or lesions Neurologic: Alert and oriented X 3, normal strength and tone. Normal symmetric reflexes. Normal coordination and gait  EKG atrial fibrillation with a ventricular response of 70.  I personally reviewed this EKG.  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  He had stenting of his LAD circumflex marginal branch and RCA in the past.  His last cath performed 07/02/2009 showed patent stents with an 80% in-stent restenosis within the obtuse marginal branch stent as well as a 60% percent ostial RCA stenosis which I stented using a Taxus liver today drug-eluting stent.  He denies chest pain but has complained of some increasing shortness of breath potentially related to the weather.  HTN (hypertension) History of essential hypertension her blood pressure measured at 122/69.  He is on carvedilol and lisinopril.  Continue current meds at current dosing.  Chronic atrial fibrillation (HCC) History of chronic A. fib rate controlled on Pradaxa oral anticoagulation.  Hyperlipidemia History of hyperlipidemia  on statin therapy  ICD (implantable cardioverter-defibrillator) in place History of ICD implantation by Dr. Lovena Le 05/21/2016 which he follows by [facility] quarterly and in the office on an annual basis.      Lorretta Harp MD FACP,FACC,FAHA, [facility] 01/15/2018 11:53 AM

## 2018-01-15 NOTE — Patient Instructions (Signed)
Medication Instructions: Your physician recommends that you continue on your current medications as directed. Please refer to the Current Medication list given to you today.  Testing/Procedures: Your physician has requested that you have an echocardiogram. Echocardiography is a painless test that uses sound waves to create images of your heart. It provides your doctor with information about the size and shape of your heart and how well your heart's chambers and valves are working. This procedure takes approximately one hour. There are no restrictions for this procedure.  Follow-Up: Your physician wants you to follow-up in: 1 year with Dr. Berry. You will receive a reminder letter in the mail two months in advance. If you don't receive a letter, please call our office to schedule the follow-up appointment.  If you need a refill on your cardiac medications before your next appointment, please call your pharmacy.  

## 2018-01-15 NOTE — Assessment & Plan Note (Signed)
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  He had stenting of his LAD circumflex marginal branch and RCA in the past.  His last cath performed 07/02/2009 showed patent stents with an 80% in-stent restenosis within the obtuse marginal branch stent as well as a 60% percent ostial RCA stenosis which I stented using a Taxus liver today drug-eluting stent.  He denies chest pain but has complained of some increasing shortness of breath potentially related to the weather.

## 2018-01-15 NOTE — Assessment & Plan Note (Signed)
History of essential hypertension her blood pressure measured at 122/69.  He is on carvedilol and lisinopril.  Continue current meds at current dosing.

## 2018-01-15 NOTE — Assessment & Plan Note (Signed)
History of ICD implantation by Dr. Lovena Le 05/21/2016 which he follows by [facility] quarterly and in the office on an annual basis.

## 2018-01-15 NOTE — Assessment & Plan Note (Signed)
History of chronic A. fib rate controlled on Pradaxa oral anticoagulation. 

## 2018-01-15 NOTE — Assessment & Plan Note (Signed)
History of hyperlipidemia on statin therapy. 

## 2018-01-17 ENCOUNTER — Ambulatory Visit (HOSPITAL_COMMUNITY): Payer: Medicare Other | Attending: Cardiovascular Disease

## 2018-01-17 ENCOUNTER — Other Ambulatory Visit: Payer: Self-pay

## 2018-01-17 DIAGNOSIS — I4891 Unspecified atrial fibrillation: Secondary | ICD-10-CM | POA: Insufficient documentation

## 2018-01-17 DIAGNOSIS — I255 Ischemic cardiomyopathy: Secondary | ICD-10-CM | POA: Diagnosis not present

## 2018-01-17 DIAGNOSIS — I10 Essential (primary) hypertension: Secondary | ICD-10-CM | POA: Diagnosis not present

## 2018-01-17 DIAGNOSIS — I081 Rheumatic disorders of both mitral and tricuspid valves: Secondary | ICD-10-CM | POA: Diagnosis not present

## 2018-01-17 DIAGNOSIS — E785 Hyperlipidemia, unspecified: Secondary | ICD-10-CM | POA: Diagnosis not present

## 2018-02-17 ENCOUNTER — Telehealth: Payer: Self-pay | Admitting: *Deleted

## 2018-02-17 NOTE — Telephone Encounter (Signed)
PRIMARY CARDIOLOGIST : DR  [facility] Pre-operative Risk Assessment    Request for surgical clearance:  1. What type of surgery is being performed? UPPER ENDOSCOPY  AT [facility].  2. When is this surgery scheduled? Mar 14 2018   3. What type of clearance is required (medical clearance vs. Pharmacy clearance to hold med vs. Both)? BOTH   4. Are there any medications that need to be held prior to surgery and how long? PRADAXA FOR 3 DAYS , ASPIRIN WILL BE HELD THE MORNING OF THE PROCEDURE  5. Practice name and name of physician performing surgery? [place], DOCTOR UNKNOWN ,   6. What is your office phone number [phone]   7.   What is your office fax number [phone] ATTN MIRACLE ,CMA   ( NEED LAST OFFICE NOTE AS WELL)  8.   Anesthesia type (None, local, MAC, general) ? UNKNOWN- MONITORED ANESTHESIA   [place] Simmonds 02/17/2018, 2:48 PM  _________________________________________________________________   (provider comments below)

## 2018-02-19 NOTE — Telephone Encounter (Signed)
Routing to the anticoag clinic.  Burtis Junes, RN, [place] [address] [place], [place]  [phone]

## 2018-02-20 NOTE — Telephone Encounter (Signed)
Patient with diagnosis of atrial fibrillation on Pradaxa for anticoagulation.    Procedure: upper endoscopy Date of procedure: 03/14/18  CHADS2-VASc score of  3 (, HTN, AGE, , AGE, male)  CrCl 87.7 Platelet count 114  Per office protocol, patient can hold Pradaxa for 3 days prior to procedure.    Patient should restart Pradaxa on the evening of procedure or day after, at discretion of procedure MD

## 2018-02-21 NOTE — Telephone Encounter (Signed)
VM left for pt to call back to discuss if any changes since last office visit.

## 2018-02-24 NOTE — Telephone Encounter (Signed)
02-24-18 904am pt rtn call to discuss if any changes since last ov for surg clearance-pls call [phone]

## 2018-02-24 NOTE — Telephone Encounter (Signed)
Phone call today - patient is doing well with no change in condition since last visit with Dr. Gwenlyn Found.   He is instructed in pharmacy's recommendations to hold Pradaxa.   Burtis Junes, RN, [place] [address] [place], [place]  [phone]

## 2018-03-05 ENCOUNTER — Ambulatory Visit (INDEPENDENT_AMBULATORY_CARE_PROVIDER_SITE_OTHER): Payer: Medicare Other | Admitting: *Deleted

## 2018-03-05 DIAGNOSIS — I255 Ischemic cardiomyopathy: Secondary | ICD-10-CM | POA: Diagnosis not present

## 2018-03-05 DIAGNOSIS — I5022 Chronic systolic (congestive) heart failure: Secondary | ICD-10-CM

## 2018-03-05 NOTE — Progress Notes (Signed)
Remote ICD transmission.   

## 2018-03-07 ENCOUNTER — Encounter: Payer: Self-pay | Admitting: Cardiology

## 2018-03-08 ENCOUNTER — Emergency Department
Admission: EM | Admit: 2018-03-08 | Discharge: 2018-03-09 | Disposition: A | Discharge status: Other Acute Inpatient Hospital | Payer: Medicare Other | Attending: Emergency Medicine | Admitting: Emergency Medicine

## 2018-03-08 ENCOUNTER — Emergency Department: Payer: Medicare Other

## 2018-03-08 ENCOUNTER — Other Ambulatory Visit: Payer: Self-pay

## 2018-03-08 DIAGNOSIS — I10 Essential (primary) hypertension: Secondary | ICD-10-CM | POA: Insufficient documentation

## 2018-03-08 DIAGNOSIS — Y999 Unspecified external cause status: Secondary | ICD-10-CM | POA: Insufficient documentation

## 2018-03-08 DIAGNOSIS — Z87891 Personal history of nicotine dependence: Secondary | ICD-10-CM | POA: Diagnosis not present

## 2018-03-08 DIAGNOSIS — J449 Chronic obstructive pulmonary disease, unspecified: Secondary | ICD-10-CM | POA: Insufficient documentation

## 2018-03-08 DIAGNOSIS — Z79899 Other long term (current) drug therapy: Secondary | ICD-10-CM | POA: Insufficient documentation

## 2018-03-08 DIAGNOSIS — I2510 Atherosclerotic heart disease of native coronary artery without angina pectoris: Secondary | ICD-10-CM | POA: Diagnosis not present

## 2018-03-08 DIAGNOSIS — S062X0A Diffuse traumatic brain injury without loss of consciousness, initial encounter: Secondary | ICD-10-CM

## 2018-03-08 DIAGNOSIS — Z7982 Long term (current) use of aspirin: Secondary | ICD-10-CM | POA: Diagnosis not present

## 2018-03-08 DIAGNOSIS — Y92008 Other place in unspecified non-institutional (private) residence as the place of occurrence of the external cause: Secondary | ICD-10-CM | POA: Diagnosis not present

## 2018-03-08 DIAGNOSIS — Z96652 Presence of left artificial knee joint: Secondary | ICD-10-CM | POA: Diagnosis not present

## 2018-03-08 DIAGNOSIS — Y9389 Activity, other specified: Secondary | ICD-10-CM | POA: Insufficient documentation

## 2018-03-08 DIAGNOSIS — S0990XA Unspecified injury of head, initial encounter: Secondary | ICD-10-CM | POA: Diagnosis present

## 2018-03-08 DIAGNOSIS — Z955 Presence of coronary angioplasty implant and graft: Secondary | ICD-10-CM | POA: Insufficient documentation

## 2018-03-08 DIAGNOSIS — X58XXXA Exposure to other specified factors, initial encounter: Secondary | ICD-10-CM | POA: Insufficient documentation

## 2018-03-08 DIAGNOSIS — S020XXA Fracture of vault of skull, initial encounter for closed fracture: Secondary | ICD-10-CM

## 2018-03-08 LAB — DIFFERENTIAL
BASOS ABS: 0.1 10*3/uL (ref 0–0.1)
Basophils Relative: 1 %
Eosinophils Absolute: 0 10*3/uL (ref 0–0.7)
Eosinophils Relative: 0 %
LYMPHS ABS: 0.8 10*3/uL — AB (ref 1.0–3.6)
Lymphocytes Relative: 8 %
MONOS PCT: 5 %
Monocytes Absolute: 0.5 10*3/uL (ref 0.2–1.0)
NEUTROS ABS: 9.6 10*3/uL — AB (ref 1.4–6.5)
NEUTROS PCT: 86 %

## 2018-03-08 LAB — CBC
HEMATOCRIT: 37.9 % — AB (ref 40.0–52.0)
HEMOGLOBIN: 12.9 g/dL — AB (ref 13.0–18.0)
MCH: 33.1 pg (ref 26.0–34.0)
MCHC: 33.9 g/dL (ref 32.0–36.0)
MCV: 97.7 fL (ref 80.0–100.0)
Platelets: 129 10*3/uL — ABNORMAL LOW (ref 150–440)
RBC: 3.89 MIL/uL — ABNORMAL LOW (ref 4.40–5.90)
RDW: 16.6 % — ABNORMAL HIGH (ref 11.5–14.5)
WBC: 11 10*3/uL — ABNORMAL HIGH (ref 3.8–10.6)

## 2018-03-08 LAB — COMPREHENSIVE METABOLIC PANEL
ALBUMIN: 4.3 g/dL (ref 3.5–5.0)
ALK PHOS: 70 U/L (ref 38–126)
ALT: 21 U/L (ref 0–44)
AST: 30 U/L (ref 15–41)
Anion gap: 9 (ref 5–15)
BILIRUBIN TOTAL: 1.2 mg/dL (ref 0.3–1.2)
BUN: 18 mg/dL (ref 8–23)
CO2: 24 mmol/L (ref 22–32)
Calcium: 9 mg/dL (ref 8.9–10.3)
Chloride: 104 mmol/L (ref 98–111)
Creatinine, Ser: 0.67 mg/dL (ref 0.61–1.24)
GFR calc Af Amer: >60 mL/min (ref >60)
GLUCOSE: 151 mg/dL — AB (ref 70–99)
POTASSIUM: 4 mmol/L (ref 3.5–5.1)
Sodium: 137 mmol/L (ref 135–145)
TOTAL PROTEIN: 7.1 g/dL (ref 6.5–8.1)

## 2018-03-08 LAB — APTT: APTT: 36 s (ref 24–36)

## 2018-03-08 LAB — TROPONIN I

## 2018-03-08 LAB — PROTIME-INR
INR: 1.35
Prothrombin Time: 16.6 seconds — ABNORMAL HIGH (ref 11.4–15.2)

## 2018-03-08 MED ORDER — FENTANYL CITRATE (PF) 100 MCG/2ML IJ SOLN: 25.0000 ug | Freq: Once | INTRAMUSCULAR | Status: DC

## 2018-03-08 MED ORDER — IDARUCIZUMAB 2.5 GM/50ML IV SOLN
5.0000 g | INTRAVENOUS | Status: AC
  Administered 2018-03-08: 5 g via INTRAVENOUS
  Filled 2018-03-08: qty 100

## 2018-03-08 MED ORDER — SODIUM CHLORIDE FLUSH 0.9 % IV SOLN
5.00 | INTRAVENOUS | Status: DC
Start: 2018-03-10 — End: 2018-03-08

## 2018-03-08 MED ORDER — GENERIC EXTERNAL MEDICATION
50.00 | Status: DC
Start: ? — End: 2018-03-08

## 2018-03-08 MED ORDER — NICARDIPINE HCL IN NACL 20-0.86 MG/200ML-% IV SOLN
0.0000 mg/h | INTRAVENOUS | Status: DC
  Filled 2018-03-08: qty 200

## 2018-03-08 MED ORDER — LEVETIRACETAM IN NACL 1000 MG/100ML IV SOLN
1000.0000 mg | Freq: Once | INTRAVENOUS | Status: AC
  Administered 2018-03-08: 1000 mg via INTRAVENOUS
  Filled 2018-03-08: qty 100

## 2018-03-08 MED ORDER — PROPOFOL 1000 MG/100ML IV EMUL
5.0000 ug/kg/min | Freq: Once | INTRAVENOUS | Status: DC
  Filled 2018-03-08: qty 100

## 2018-03-08 MED ORDER — PROPOFOL 100 MG/10ML IV EMUL
5.00 | INTRAVENOUS | Status: DC
Start: ? — End: 2018-03-08

## 2018-03-08 MED ORDER — NICARDIPINE HCL IN NACL 40-0.83 MG/200ML-% IV SOLN
3.00 | INTRAVENOUS | Status: DC
Start: ? — End: 2018-03-08

## 2018-03-08 MED ORDER — FENTANYL 2500MCG IN NS 250ML (10MCG/ML) PREMIX INFUSION: 100.0000 ug/h | INTRAVENOUS | Status: DC

## 2018-03-08 NOTE — ED Notes (Signed)
Pt was decreased LOC intubation occurred with 8.0 tube 24 at the lip. Pt received 20 Etom and 100 SUCC. Pt tolerated intubation well.

## 2018-03-08 NOTE — ED Provider Notes (Addendum)
[facility],The Emergency Department Provider Note   ____________________________________________    I have reviewed the triage vital signs and the nursing notes.   HISTORY  Chief Complaint Altered Mental Status and Emesis  Are limited by altered mental status   HPI [name] is a 77 y.o. male who presents with altered mental status.  Per the patient's fianc he was working down in the basement today and she came down and found him confused, staring forward.  She was concerned that he had a stroke so she brought him to the emergency department.  Patient is uncomfortable and confused, unable to provide significant history.  He denies trauma   Past Medical History:  Diagnosis Date  . A-fib ([place])   . AICD (automatic cardioverter/defibrillator) present 05/21/2016  . Arthritis    "back of my neck extending to back of head" (05/21/2016)  . Barrett's esophagus    Gets periodic endoscopies & biopsies. Coumadin has been held in the past for these precedures.   . Basal cell carcinoma    "scalp; chest; back"  . Blurred vision   . CAD (coronary artery disease)    S/P stenting of his LAD, circumflex & marginal branch as well as his RCA in 2008.  . Cardiomyopathy ([place])    Significant ischemic cardiomyopathy. ECHO 08/27/12: EF = 45-50% (Previous Echo revealed EF = 25%)  . Carotid artery occlusion    Carotid Doppler 08/27/12 SUMMARY - Bilateral ICAs: Demonstrated normal patency without evidence of a significant diamenter reduction, tortuosity or any other vascular abnormality. This was a Normal carotid duplex Doppler Evaluation.  . Cerebral atherosclerosis    Carotid Doppler 08/27/12 SUMMARY - Bilateral ICAs: Demonstrated normal patency without evidence of a significant diamenter reduction, tortuosity or any other vascular abnormality. This was a Normal carotid duplex Doppler Evaluation.  . CHF (congestive heart failure) ([place])   . Chronic anticoagulation    For PE  & PAF  . Claudication ([place])    right ABI of 0.84  . Coronary artery disease    Most recent cath by Dr. Gwenlyn Found 07/02/09 was remarkable for patent stents w/an 80% in-stent stenosis within the obtuse marginal branch stent, as well as 60% ostial RCA stenosis which was stented with a Taxus Liberte drug-eluting stent. Nuc stress test 09/03/12 was low risk & showed an inferolateral scar. & an EF of 39%  . Daily headache    "from the arthritis in back of neck" (05/21/2016)  . DVT (deep venous thrombosis) ([place]) 09/2010   "after knee replacement"  . GERD (gastroesophageal reflux disease)   . H/O mitral valve disorder   . History of hiatal hernia   . History of kidney stones   . History of stomach ulcers   . Hyperlipidemia   . Hypertension   . Meningioma ([place]) 08/15/12   BrainLAB-guided left frontoparietal craniotomy w/removal of a meningioma.   . Obstructive sleep apnea    Sleep Study in 2011 AHI 3.2. "occasionally wear my mask" (05/21/2016)  . Paroxysmal atrial fibrillation (HCC)   . PSVT (paroxysmal supraventricular tachycardia) ([place])    2D ECHO 08/27/12: EF = 45-50% (Previous Echo revealed EF = 25%)  . Pulmonary embolism ([place]) 09/2010   "after knee replacement"  . Shortness of breath dyspnea   . Sick sinus syndrome ([place])   . TIA (transient ischemic attack)     Patient Active Problem List   Diagnosis Date Noted  . ICD (implantable cardioverter-defibrillator) in place 05/21/2016  . 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 Claudication of right lower extremity ([place]) 10/04/2015  . Preop cardiovascular exam 11/16/2014  . Palpitations 08/13/2013  . Chronic atrial fibrillation ([place]) 03/26/2013  . Hyperlipidemia 03/26/2013  . Barrett's esophagus 03/26/2013  . Obstructive sleep apnea 03/26/2013  . Cardiomyopathy, ischemic 12/19/2012  . HTN (hypertension) 12/19/2012  . Hx pulmonary embolism 12/19/2012  . NSVT (nonsustained ventricular tachycardia) ([place]) 10/23/2012  . CAD S/P multiple PCIs  10/23/2012  . PSVT (paroxysmal supraventricular tachycardia) ([place]) 09/30/2012  . Long term (current) use of anticoagulants 09/30/2012    Past Surgical History:  Procedure Laterality Date  . BASAL CELL CARCINOMA EXCISION     "head X 2; back, chest" (05/21/2016)  . CARDIAC CATHETERIZATION     Past caths in 2008, 2006, 2005 & 2003  . CATARACT EXTRACTION W/ INTRAOCULAR LENS  IMPLANT, BILATERAL Bilateral 2005  . CORONARY ANGIOPLASTY WITH STENT PLACEMENT  07/02/09   Most recent cath by Dr. Gwenlyn Found 07/02/09 was remarkable for patent stents w/an 80% in-stent stenosis within the obtuse marginal branch stent, as well as 60% ostial RCA stenosis which was stented with a Taxus Liberte drug-eluting stent.   . CORONARY ANGIOPLASTY WITH STENT PLACEMENT     "I have 12 stents in my heart" (05/21/2016)  . CRANIOTOMY  08/15/2012   At [facility]. BrainLAB-guided left frontoparietal craniotomy w/removal of a meningioma.   . EP IMPLANTABLE DEVICE N/A 05/21/2016   Procedure: ICD Implant;  Surgeon: Evans Lance, MD;  Location: [facility];  Service: Cardiovascular;  Laterality: N/A;  . INGUINAL HERNIA REPAIR Right 11/23/2014   Procedure: HERNIA REPAIR INGUINAL ADULT;  Surgeon: Rochel Brome, MD;  Location: [facility] ORS;  Service: General;  Laterality: Right;  . INGUINAL HERNIA REPAIR Bilateral 2010-2016  . JOINT REPLACEMENT    . THROAT SURGERY     "laser for Barret's esophagus"  . TONSILLECTOMY    . TOTAL KNEE ARTHROPLASTY Left 09/2010   Surgery was complicated by PAF with a pulmonary embolism documented by CT angiogram.    Prior to Admission medications   Medication Sig Start Date End Date Taking? Authorizing Provider  acetaminophen (TYLENOL) 500 MG tablet Take 500 mg by mouth every 6 (six) hours as needed (for pain.).    Yes [provider]  aspirin EC 81 MG tablet Take 81 mg by mouth daily.   Yes [provider]  atorvastatin (LIPITOR) 80 MG tablet Take 80 mg by mouth every evening.    Yes  [provider]  calcium-vitamin D (OSCAL WITH D) 500-200 MG-UNIT per tablet Take 1 tablet by mouth 2 (two) times daily.    Yes [provider]  carvedilol (COREG) 25 MG tablet Take 1 tablet (25 mg total) by mouth 2 (two) times daily with a meal. 09/18/17  Yes Lorretta Harp, MD  dabigatran (PRADAXA) 150 MG CAPS capsule Take 150 mg by mouth every 12 (twelve) hours.   Yes [provider]  lisinopril (PRINIVIL,ZESTRIL) 10 MG tablet Take 10 mg by mouth every evening.    Yes [provider]  Multiple Vitamins-Minerals (MULTIVITAMIN WITH MINERALS) tablet Take 1 tablet by mouth daily.   Yes [provider]  omeprazole (PRILOSEC) 40 MG capsule Take 40 mg by mouth 2 (two) times daily.    Yes [provider]  polyvinyl alcohol (LIQUIFILM TEARS) 1.4 % ophthalmic solution Place 1 drop into both eyes as needed for dry eyes.    Yes [provider]  vitamin B-12 (CYANOCOBALAMIN) 1000 MCG tablet Take  1,000 mcg by mouth daily.   Yes [provider]     Allergies Patient has no known allergies.  Family History  Problem Relation Age of Onset  . Cancer Mother   . Cardiomyopathy Sister   . Cancer Other        Breast    Social History Social History   Tobacco Use  . Smoking status: Former Smoker    Packs/day: 1.00    Years: 35.00    Pack years: 35.00    Types: Cigarettes    Last attempt to quit: 1980    Years since quitting: 39.6  . Smokeless tobacco: Former Systems developer    Quit date: 1989  Substance Use Topics  . Alcohol use: Yes    Alcohol/week: 28.0 standard drinks    Types: 28 Cans of beer per week    Comment: 4 beers per day  . Drug use: No    Level 5 caveat: Unable to obtain review of Systems altered mental status     ____________________________________________   PHYSICAL EXAM:  VITAL SIGNS: ED Triage Vitals  Enc Vitals Group     BP 03/08/18 1658 (!) 190/113     Pulse Rate 03/08/18 1658 86     Resp 03/08/18  1700 19     Temp 03/08/18 1658 98.7 F (37.1 C)     Temp Source 03/08/18 1658 Oral     SpO2 03/08/18 1658 98 %     Weight 03/08/18 1658 77.1 kg (170 lb)     Height 03/08/18 1658 1.829 m (6')     Head Circumference --      Peak Flow --      Pain Score 03/08/18 1658 10     Pain Loc --      Pain Edu? --      Excl. in [place]? --     Constitutional: Alert, confused disoriented Eyes: PERRLA Head: Minimal amount of swelling over the left parietal scalp no skin break or bleeding Nose: No Stacks is Mouth/Throat: Mucous membranes are moist.   Neck: No vertebral tenderness to palpation with normal range of motion Cardiovascular: Normal rate, regular rhythm. Grossly normal heart sounds.  Good peripheral circulation. Respiratory: Normal respiratory effort.  No retractions. Lungs CTAB. Gastrointestinal: Soft and nontender. No distention.   Musculoskeletal: Moving all extremities, strength appears normal Neurologic: Confused and disoriented but moving all extrem extremities, protecting airway Skin:  Skin is warm, dry and intact. No rash noted.   ____________________________________________   LABS (all labs ordered are listed, but only abnormal results are displayed)  Labs Reviewed  PROTIME-INR - Abnormal; Notable for the following components:      Result Value   Prothrombin Time 16.6 (*)    All other components within normal limits  CBC - Abnormal; Notable for the following components:   WBC 11.0 (*)    RBC 3.89 (*)    Hemoglobin 12.9 (*)    HCT 37.9 (*)    RDW 16.6 (*)    Platelets 129 (*)    All other components within normal limits  DIFFERENTIAL - Abnormal; Notable for the following components:   Neutro Abs 9.6 (*)    Lymphs Abs 0.8 (*)    All other components within normal limits  COMPREHENSIVE METABOLIC PANEL - Abnormal; Notable for the following components:   Glucose, Bld 151 (*)    All other components within normal limits  APTT  TROPONIN I  URINE DRUG SCREEN, QUALITATIVE  ([facility] ONLY)  URINALYSIS, COMPLETE ([facility])  WITH MICROSCOPIC  APTT   ____________________________________________  EKG  ED ECG REPORT I, Lavonia Drafts, the attending physician, personally viewed and interpreted this ECG.  Date: 03/08/2018  Rhythm: A. fib significant artifact limits interpretation QRS Axis: normal Intervals: normal ST/T Wave abnormalities: normal Narrative Interpretation: no evidence of acute ischemia  ____________________________________________  RADIOLOGY  All by radiologist and notified of skull fracture with subdural and subarachnoid hemorrhage ____________________________________________   PROCEDURES  Procedure(s) performed: yes  Procedure Name: Intubation Date/Time: 03/08/2018 6:42 PM Performed by: Lavonia Drafts, MD Pre-anesthesia Checklist: Patient identified, Emergency Drugs available, Suction available and Patient being monitored Preoxygenation: Pre-oxygenation with 100% oxygen Induction Type: IV induction and Rapid sequence Laryngoscope Size: Glidescope and 3 Grade View: Grade I Tube size: 8.0 mm Number of attempts: 1 Placement Confirmation: ETT inserted through vocal cords under direct vision,  CO2 detector and Breath sounds checked- equal and bilateral Tube secured with: ETT holder Dental Injury: Teeth and Oropharynx as per pre-operative assessment         Critical Care performed: yes  CRITICAL CARE Performed by: Lavonia Drafts   Total critical care time: 30 minutes  Critical care time was exclusive of separately billable procedures and treating other patients.  Critical care was necessary to treat or prevent imminent or life-threatening deterioration.  Critical care was time spent personally by me on the following activities: development of treatment plan with patient and/or surrogate as well as nursing, discussions with consultants, evaluation of patient's response to treatment, examination of patient, obtaining history from  patient or surrogate, ordering and performing treatments and interventions, ordering and review of laboratory studies, ordering and review of radiographic studies, pulse oximetry and re-evaluation of patient's condition.  ____________________________________________   INITIAL IMPRESSION / ASSESSMENT AND PLAN / ED COURSE  Pertinent labs & imaging results that were available during my care of the patient were reviewed by me and considered in my medical decision making (see chart for details).  Patient presents with altered mental status, code stroke called by triage nurse.  Contacted by radiologist and informed of nondisplaced skull fracture with intracranial hemorrhage.  Discussed with fianc, no record of trauma.  Patient is on Pradaxa.  Praxbind ordered per pharmacy protocols.  Family requests transfer to [facility].  Discussed with [facility] physician Dr. Kinnie Scales who accepts the patient in transfer.  Nicardipine drip ordered for blood pressure control  Keppra ordered for seizure prophylaxis ----------------------------------------- 6:45 PM on 03/08/2018 -----------------------------------------  Patient developed increasing agitation, although not a significant change in mental status.  Concern for stability to transfer so decision made to intubate  Intubated without difficulty, on propofol drip for sedation, also received 25 mcg of fentanyl  ----------------------------------------- 6:46 PM on 03/08/2018 -----------------------------------------  [facility] is here for transfer.  Confirmed intubation with bilateral breath sounds, positive color change on end-tidal CO2 monitor.  Okay for transfer without x-ray    ____________________________________________   FINAL CLINICAL IMPRESSION(S) / ED DIAGNOSES  Final diagnoses:  Closed fracture of parietal bone, initial encounter ([place])  Intracranial hematoma following injury, without loss of consciousness, initial encounter [facility])         Note:  This document was prepared using Dragon voice recognition software and may include unintentional dictation errors.    Lavonia Drafts, MD 03/08/18 Pollie Meyer    Lavonia Drafts, MD 03/08/18 [phone]

## 2018-03-08 NOTE — ED Notes (Signed)
Pt gf states that he fell out of tree about 2 mths ago. Pt keeps stating he has a bad HA. EDP assessing pt at this time. No know acute injury today.

## 2018-03-08 NOTE — ED Notes (Signed)
Tracey at transport for [facility] called for report. Statesunknown time of arrival of arrival at this time.

## 2018-03-08 NOTE — ED Notes (Signed)
Called 333 and activated code stroke to [facility]

## 2018-03-08 NOTE — ED Triage Notes (Signed)
Patient fiance states "he was in the basement working on something and I went downstairs and he was sitting on couch with a blank stare. I got him to stand up to go up the stairs and he stumbled up them. I got him to read something and he was slurring his words. And I tried to get him to eat some popcorn and it kept falling out of his mouth. He threw up on the way over here once" pt c/o headache. Patient seems to have trouble finding his words in triage. Fiance states this is not normal for him

## 2018-03-08 NOTE — ED Notes (Signed)
emtala reviewed by this RN 

## 2018-03-08 NOTE — ED Notes (Signed)
Pt stable at this time. Pt family is agreeable to transfer to [facility].

## 2018-03-08 NOTE — ED Notes (Signed)
Pt is resting with family at bedside. RN will monitor.

## 2018-03-08 NOTE — ED Notes (Addendum)
Code Stroke cancelled by Dr Corky Downs at this time.

## 2018-03-09 MED ORDER — GENERIC EXTERNAL MEDICATION
2.00 | Status: DC
Start: 2018-03-09 — End: 2018-03-09

## 2018-03-09 MED ORDER — LISINOPRIL 10 MG PO TABS
10.00 | ORAL_TABLET | ORAL | Status: DC
Start: 2018-03-11 — End: 2018-03-09

## 2018-03-09 MED ORDER — CHLORHEXIDINE GLUCONATE 0.12 % MT SOLN
5.00 | OROMUCOSAL | Status: DC
Start: 2018-03-10 — End: 2018-03-09

## 2018-03-09 MED ORDER — GENERIC EXTERNAL MEDICATION
Status: DC
Start: ? — End: 2018-03-09

## 2018-03-09 MED ORDER — HYDRALAZINE HCL 20 MG/ML IJ SOLN
10.00 | INTRAMUSCULAR | Status: DC
Start: ? — End: 2018-03-09

## 2018-03-09 MED ORDER — GENERIC EXTERNAL MEDICATION
20.00 | Status: DC
Start: 2018-03-10 — End: 2018-03-09

## 2018-03-09 MED ORDER — DEXMEDETOMIDINE HCL IN NACL 400 MCG/100ML IV SOLN
0.20 | INTRAVENOUS | Status: DC
Start: ? — End: 2018-03-09

## 2018-03-09 MED ORDER — LIDOCAINE 5 % EX PTCH
1.00 | MEDICATED_PATCH | CUTANEOUS | Status: DC
Start: 2018-03-10 — End: 2018-03-09

## 2018-03-09 MED ORDER — LABETALOL HCL 5 MG/ML IV SOLN
10.00 | INTRAVENOUS | Status: DC
Start: ? — End: 2018-03-09

## 2018-03-09 MED ORDER — DEXTROSE 10 % IV SOLN
50.00 | INTRAVENOUS | Status: DC
Start: ? — End: 2018-03-09

## 2018-03-09 MED ORDER — LIDOCAINE HCL (PF) 1 % IJ SOLN
0.50 | INTRAMUSCULAR | Status: DC
Start: ? — End: 2018-03-09

## 2018-03-09 MED ORDER — BISACODYL 10 MG RE SUPP
10.00 | RECTAL | Status: DC
Start: ? — End: 2018-03-09

## 2018-03-09 MED ORDER — SENNOSIDES-DOCUSATE SODIUM 8.6-50 MG PO TABS
2.00 | ORAL_TABLET | ORAL | Status: DC
Start: 2018-03-11 — End: 2018-03-09

## 2018-03-09 MED ORDER — ATORVASTATIN CALCIUM 80 MG PO TABS
80.00 | ORAL_TABLET | ORAL | Status: DC
Start: 2018-03-11 — End: 2018-03-09

## 2018-03-09 MED ORDER — MAGNESIUM SULFATE 2 GM/50ML IV SOLN
2.00 | INTRAVENOUS | Status: DC
Start: ? — End: 2018-03-09

## 2018-03-09 MED ORDER — ACETAMINOPHEN 325 MG PO TABS
650.00 | ORAL_TABLET | ORAL | Status: DC
Start: ? — End: 2018-03-09

## 2018-03-09 MED ORDER — POLYETHYLENE GLYCOL 3350 17 G PO PACK
17.00 | PACK | ORAL | Status: DC
Start: ? — End: 2018-03-09

## 2018-03-09 MED ORDER — GENERIC EXTERNAL MEDICATION
1000.00 | Status: DC
Start: 2018-03-09 — End: 2018-03-09

## 2018-03-09 MED ORDER — MAGNESIUM SULFATE 2 GM/50ML IV SOLN
2.00 | INTRAVENOUS | Status: DC
Start: 2018-03-09 — End: 2018-03-09

## 2018-03-09 MED ORDER — SODIUM CHLORIDE 0.9 % IV SOLN
INTRAVENOUS | Status: DC
Start: ? — End: 2018-03-09

## 2018-03-09 MED ORDER — CARVEDILOL 12.5 MG PO TABS
12.50 | ORAL_TABLET | ORAL | Status: DC
Start: 2018-03-09 — End: 2018-03-09

## 2018-03-10 MED ORDER — LEVETIRACETAM 500 MG PO TABS
1000.00 | ORAL_TABLET | ORAL | Status: DC
Start: 2018-03-10 — End: 2018-03-10

## 2018-03-10 MED ORDER — CARVEDILOL 25 MG PO TABS
25.00 | ORAL_TABLET | ORAL | Status: DC
Start: 2018-03-10 — End: 2018-03-10

## 2018-03-10 MED ORDER — ACETAMINOPHEN 325 MG PO TABS
650.00 | ORAL_TABLET | ORAL | Status: DC
Start: ? — End: 2018-03-10

## 2018-03-14 ENCOUNTER — Encounter: Admission: RE | Payer: Self-pay | Source: Ambulatory Visit

## 2018-03-14 ENCOUNTER — Ambulatory Visit
Admission: RE | Admit: 2018-03-14 | Payer: Medicare Other | Source: Ambulatory Visit | Admitting: Unknown Physician Specialty

## 2018-03-14 SURGERY — ESOPHAGOGASTRODUODENOSCOPY (EGD) WITH PROPOFOL
Anesthesia: General

## 2018-04-08 LAB — CUP PACEART REMOTE DEVICE CHECK
Battery Remaining Percentage: 100 %
Brady Statistic RA Percent Paced: 0 %
Brady Statistic RV Percent Paced: 3 %
Date Time Interrogation Session: 20190821090100
HIGH POWER IMPEDANCE MEASURED VALUE: 59 Ohm
Implantable Lead Implant Date: 20171106
Implantable Lead Implant Date: 20171106
Implantable Lead Location: 753860
Implantable Lead Model: 293
Implantable Lead Serial Number: 828526
Lead Channel Impedance Value: 447 Ohm
Lead Channel Pacing Threshold Amplitude: 0.8 V
Lead Channel Setting Pacing Amplitude: 2 V
Lead Channel Setting Pacing Amplitude: 2.5 V
Lead Channel Setting Pacing Pulse Width: 0.4 ms
Lead Channel Setting Sensing Sensitivity: 0.5 mV
MDC IDC LEAD LOCATION: 753859
MDC IDC LEAD SERIAL: 422759
MDC IDC MSMT BATTERY REMAINING LONGEVITY: 126 mo
MDC IDC MSMT LEADCHNL RA IMPEDANCE VALUE: 657 Ohm
MDC IDC MSMT LEADCHNL RV PACING THRESHOLD PULSEWIDTH: 0.4 ms
MDC IDC PG IMPLANT DT: 20171106
Pulse Gen Serial Number: 519130

## 2018-05-14 ENCOUNTER — Encounter: Payer: Self-pay | Admitting: *Deleted

## 2018-05-14 NOTE — PMR Pre-admission (Signed)
[facility] PMR Admission Coordinator Pre-Admission Assessment  Patient: [name] is an 77 y.o., male MRN: [id number] DOB: [birth year] Height:   Weight:    Insurance Information HMO:     PPO:      PCP:      IPA:      80/20:      OTHER: no HMO PRIMARY: Medicare a and b      Policy#: [id number]      Subscriber: pt Benefits:  Phone #: passport one online 05/14/2018 Eff. Date: 08/17/2003     Deduct: $1364      Out of Pocket Max: none      Life Max: none CIR: 100%      SNF: 20 full days Outpatient: 80%     Co-Pay: 20% Home Health: 100%      Co-Pay: none DME: 80%     Co-Pay: 20% Providers: pt choice  [facility]. As of 05/15/2018 has used 53 medicare days  SECONDARY: CSI       Policy#: [id number]      Subscriber: pt Benefits:  Phone #: [phone]     Medicare supplement plan [id number] No pre cert required  Medicaid Application Date:       Case Manager:  Disability Application Date:       Case Worker:   Emergency Contact Information Contact Information    Name Relation Home Work Mobile   [place] Significant other   Snook Son   [phone]   Seann, Genther Daughter   [phone]      Current Medical History  Patient Admitting Diagnosis: TBI  History of Present Illness: 77 year old with history of atrial fibrillation, chronic systolic CHF ( EF 78%), CAD, H/o PE and OSA who was initially seen at [facility] ED 03/08/2018  after falling from a ladder and sustaining a ICH. Transferred to [facility] for acute care by air flight. Intubated in the ED and admitted to the Neuro ICU. Initial CT on 8/24 demonstrated a SDH and bifrontal cerebral contusion. Repeat head CT on 8/25 stable. MRI on 8/28 showed stable but extensive multi compartmental ICH. CT negative of spine for acute fractures . S/P EVD with drain removal on 8/31 and suture removal 9/17. He does have multiple non displaced rib fractures left 10 th and 11 th ribs,  from a ladder fall June 2019. On 9/12 pt was transitioned form foley to condom catheter and suffered AKI due to urinary retention. EEG performed on 9/13 for left hand twitching, which showed left central parietal slowing with left hemispheric interictal broad sharps but no correlation to tremor. Pt on Vimpat for his history of left posterior meningioma resection in 2014. Hospital course at [facility] intermittently complicated by night time agitation, which resolved and Seroquel discontinued. For his history of a fib, Pradaxa was recommended to be held until after 10/5. On Lovenox for DVT prophylaxis. For HTN continued on amlodipine , metoprolol, clonidine and lisinopril ( new after AKI).     ON 04/03/2018 patient was admitted to [place] for rehab. The patient's rehab course for notable trial of Amantadine which the family felt made him more restless, then the patient was decreased to 50 mg in the am and eventually discontinued on 10/7. The patient also started on Donepezil for his aphasia and cognitive deficits. Additionally, the trial of Sinemet for initiation and movement as he did not respond well to Amantadine which was started on 10/8 and  per therapy team was noted to have positive effects. Patient failed multiple voiding trails and per discussion with Urology to keep foley in place until outpatient work up complete.  He had been progressing with his recovery, but was found unresponsive on 04/23/2018 and readmitted to acute hospital.   Initially ruled out stroke, his initial Head CT showed no acute findings, but did show left temporal and right frontal encephalomalacia due to hemorrhagic contusions. Neurology was consulted. Felt possible seizure. He was placed on EEG which demonstrated focal L hemispheric cerebral dysfunction with intermittent epileptiform discharges in the left parietal region ( but no active seizures). Neurology started him on Vimpat. Neurology re consulted on 10/21 for there was a  questionable seizure in the am. Neurology recommended continuing Vimpat, maintain normotension and to perform positional changes slowly.   On AIR, PM and R MD had started pt on Sinemet and pt's family felt that this was when a functional decline was observed. Sinemet was held as well as Aricept and flomax due to suspected intolerances.  Patient has demonstrated intermittent orthostasis and frequent exertional tachycardia up to the 160's. Throughout past week, patient exhibited some intermittent tachycardia with exertion and mild ortho stasis, but resolved with IV fluid administration. Appears possible Afib on telemetry as well as EKG. metoprolol adjusted.  Continued TED hose. Stopped flomax. CTA chest negative for PE.  Foley placed on 10/17. Stopped flomax due to orthostatics . Treating with finasteride.   Dysphagia with PEG in place but tolerating mechanical soft diet with honey thick liquids. Tube feeding have been discontinued. Would consider removing PEG.  Facial rash improved. Suspect seborrheic dermatitis and responded to a course of ketoconazole.   [name] medical record from [place] has been reviewed by the rehabilitation admission coordinator and physician. Discussions with daughter, Lawernce Earll, by phone , as well as Education officer, museum at [employer], Glori Bickers.  NIH Stroke scale: @FLOW ([id number]) Glascow Coma Scale: )    Past Medical History  Past Medical History:  Diagnosis Date  . A-fib ([place])   . AICD (automatic cardioverter/defibrillator) present 05/21/2016  . Arthritis    "back of my neck extending to back of head" (05/21/2016)  . Barrett's esophagus    Gets periodic endoscopies & biopsies. Coumadin has been held in the past for these precedures.   . Basal cell carcinoma    "scalp; chest; back"  . Blurred vision   . CAD (coronary artery disease)    S/P stenting of his LAD, circumflex & marginal branch as well as his RCA in 2008.  . Cardiomyopathy ([place])    Significant  ischemic cardiomyopathy. ECHO 08/27/12: EF = 45-50% (Previous Echo revealed EF = 25%)  . Carotid artery occlusion    Carotid Doppler 08/27/12 SUMMARY - Bilateral ICAs: Demonstrated normal patency without evidence of a significant diamenter reduction, tortuosity or any other vascular abnormality. This was a Normal carotid duplex Doppler Evaluation.  . Cerebral atherosclerosis    Carotid Doppler 08/27/12 SUMMARY - Bilateral ICAs: Demonstrated normal patency without evidence of a significant diamenter reduction, tortuosity or any other vascular abnormality. This was a Normal carotid duplex Doppler Evaluation.  . CHF (congestive heart failure) ([place])   . Chronic anticoagulation    For PE & PAF  . Claudication ([place])    right ABI of 0.84  . Coronary artery disease    Most recent cath by Dr. Gwenlyn Found 07/02/09 was remarkable for patent stents w/an 80% in-stent stenosis within the obtuse marginal branch stent, as well as 60% ostial RCA  stenosis which was stented with a Taxus Liberte drug-eluting stent. Nuc stress test 09/03/12 was low risk & showed an inferolateral scar. & an EF of 39%  . Daily headache    "from the arthritis in back of neck" (05/21/2016)  . DVT (deep venous thrombosis) ([place]) 09/2010   "after knee replacement"  . GERD (gastroesophageal reflux disease)   . H/O mitral valve disorder   . History of hiatal hernia   . History of kidney stones   . History of stomach ulcers   . Hyperlipidemia   . Hypertension   . Meningioma ([place]) 08/15/12   BrainLAB-guided left frontoparietal craniotomy w/removal of a meningioma.   . Obstructive sleep apnea    Sleep Study in 2011 AHI 3.2. "occasionally wear my mask" (05/21/2016)  . Paroxysmal atrial fibrillation (HCC)   . PSVT (paroxysmal supraventricular tachycardia) ([place])    2D ECHO 08/27/12: EF = 45-50% (Previous Echo revealed EF = 25%)  . Pulmonary embolism ([place]) 09/2010   "after knee replacement"  . Shortness of breath dyspnea   . Sick sinus syndrome ([place])    . TIA (transient ischemic attack)     Family History   family history includes Cancer in his mother and other; Cardiomyopathy in his sister.  Prior Rehab/Hospitalizations Has the patient had major surgery during 100 days prior to admission? Yes    Current Medications  apixaban Aspirin tums Cholecalciferol Colace Finasteride Ketoconazole Vimpat Iansoprazole Mg oxide Melatonin Metoprolol Polyethylene glycol Senna Flomax  Patients Current Diet:   Soft diet with honey thick liquids. Meds whole in puree. NO straws. Thin liquids recommended by SLP  Precautions / Restrictions Precautions Precautions: Fall Precautions/Special Needs: Behavior Restrictions Weight Bearing Restrictions: No   Has the patient had 2 or more falls or a fall with injury in the past year?No  Yes fall 6/19 from ladder with rib fractures and fall 02/26/18 in basement  Prior Activity Level Community (5-7x/wk): active and Independent; drove(outdorr activities and yardwork)  Prior Functional Level Do you want Prior Function Level of Independence: Independent Comments: no AD used from other? Self Care: Did the patient need help bathing, dressing, using the toilet or eating?  Independent  Indoor Mobility: Did the patient need assistance with walking from room to room (with or without device)? Independent  Stairs: Did the patient need assistance with internal or external stairs (with or without device)? Independent  Functional Cognition: Did the patient need help planning regular tasks such as shopping or remembering to take medications? Independent  Home Assistive Devices / Equipment Home Assistive Devices/Equipment: None  Prior Device Use: Indicate devices/aids used by the patient prior to current illness, exacerbation or injury? None of the above  Prior Functional Level Comments: no AD used   Prior Functional Level Current Functional Level  Bed Mobility Independent Min assist without cues  once bed sheets removed, min cues for pace and safety; pt continually reaches out to staff in attempt to pull self forward. Mod verbal cues and pt able to use BUE to push hips toward EOB. 10/30  Transfers Independent  Max assist; B knees and feet blocked with rocking and trunk/pelvis facilitation into anterior weight shifts. Pt demonstrates retropulsion and resists facilitation. 10/30  Mobility - Walk/Wheelchair Independent 10/25 ambulated 40 feet and 25 feet with RW. Flexed posture, shuffling gait and narrow BOS. Distance limited by increased HR. 10/23 min assist transfer, min assist HHA 60 feet X 2. Easily distracted in the hallway. Min assist for mild unsteadiness  Mobility - Ambulation/Gait  Independent 10/29 max assist with wheelchair manipulation  Upper Body Dressing Independent 10/30 max cues to initiate face washing and bathing UE. 10/27 Mod assist  Lower Body Dressing Independent 10/30 Max assist to total; 10/27 max   Grooming Independent Mod assist with mod cues 10/30  Eating/Drinking Independent Mod assist  Toilet Transfer Independent Mod to max assist  Bladder Continence Independent Indwelling catheter  Bowel Management  Independent Incontinent  Stair Climbing Independent Not attempted  Communication Independent Nonsensical speech  Memory Independent Total assist  Cooking/Meal Prep  independent      Housework  independent   Money Management  managed his own finances   Driving  Independent    Gradual decline in function since readmit to acute hospital on 10/9 noted as demonstrated by therapy treatments. Was min assist overall with mod cues at AIR. Was preparing for discharge home in 8 days prior to return to acute hospital.  OT 10/24 pt able to ambulate min assist to BR with use of RW requiring min assist. toileting and lower body dressing mod assist. Limited by poor safety awareness and attention/cognition.  Speech 10/28 with OT, nonsensical speech throughout session and very  restless decreased attention. Limited in adls for pt requires consistent verbal/tactile cuing for initiation.   SLP 05/12/2018 MBS Dysphagia diet, no straws, meds in puree. Do not crush. thin liquid. NDD3.. Mild oropharyngeal dysphagia. Premature spillage and reduced oral control with thin liquids via straw. Delayed initiation of swallow , mistiming leading to intermittent penetration with thin and nectar thick liquids. Penetrated material reached VF with thin liquids via straw, mostly expelled with completion of swallow. No significant penetration with thin liquids via cup or nectar thick liquids. Recommend thin liquids, no straw, mech soft altered meds 1 at a time in puree.   Special needs/care consideration BiPAP/CPAP n/a CPM n/a Continuous Drip IV n/a Dialysis n/a Life Vest n/a Oxygen n/a Special Bed n/a Trach Size n/a Wound Vac n/a Skin noted skin rash treated with medicated cream    Bowel mgmt: incontinent Bladder mgmt: indwelling catheter since 05/01/2018 Diabetic mgmt n/a Decreased safety awareness  Previous Home Environment Living Arrangements: Spouse/significant other  Lives With: Significant other(Janice, fiance) Available Help at Discharge: Family, Available 24 hours/day(daughter will arrange paid caregivers as needed) Type of Home: House Home Layout: Multi-level, Able to live on main level with bedroom/bathroom, Full bath on main level Home Access: Stairs to enter Entrance Stairs-Rails: Right, Left Entrance Stairs-Number of Steps: 6 Bathroom Shower/Tub: Multimedia programmer: Standard Bathroom Accessibility: Yes How Accessible: Accessible via walker [place]: No  Discharge Living Setting Plans for Discharge Living Setting: [name] home, Lives with (comment)(fiance, [name]) Type of Home at Discharge: House Discharge Home Layout: Multi-level, Full bath on main level, Able to live on main level with bedroom/bathroom Discharge Home Access: Stairs  to enter Entrance Stairs-Rails: Right, Left Entrance Stairs-Number of Steps: 6 Discharge Bathroom Shower/Tub: Walk-in shower Discharge Bathroom Toilet: Standard Discharge Bathroom Accessibility: Yes How Accessible: Accessible via walker Does the patient have any problems obtaining your medications?: No  Social/Family/Support Systems Patient Roles: Partner, Parent Contact Information: Mataeo Ingwersen, daughter Anticipated Caregiver: family and hired caregivers as needed Anticipated Caregiver's Contact Information: [phone] Ability/Limitations of Caregiver: daughter works, but family takes shifts and will hire caregivers Caregiver Availability: 24/7 Discharge Plan Discussed with Primary Caregiver: Yes Is Caregiver In Agreement with Plan?: Yes Does Caregiver/Family have Issues with Lodging/Transportation while Pt is in Rehab?: No(family has remained at bedside 24/7 since initial admit)  Goals/Additional Needs Patient/Family Goal for Rehab: Min PT, OT, and SLP Expected length of stay: ELOS 10 to 14 days Pt/Family Agrees to Admission and willing to participate: Yes Program Orientation Provided & Reviewed with Pt/Caregiver Including Roles  & Responsibilities: Yes   I discussed with daughter, Wladyslaw Henrichs, that goal is for discharge home with 24/7 family and hired caregiver support. Return to his familiar home environment with family and follow up therapy with home health to progress to outpatient therapy .  Patient Condition: Patient will benefit from ongoing PT, OT, and SLP offered from the coordinated team approach during an Inpatient Acute Rehabilitation admission. He will receive 3 hours of therapy per day as well as Rehabilitation physician, Nursing and Care management interventions. Due to his ICH, the patient requires 24/day rehab nursing. He is currently mod to max assist with all mobility and ADLS. Patient can actively participate in an intensive therapy program of at least 3 hrs of  therapy 5 days a week and make measurable gains while admitted. Anticipated discharge setting is Home with 24/7 minimal assistance of caregivers with home health therapy. Patient/Family have agreed to participate in the Acute Inpatient Rehabilitation program. We will admit today 05/15/2018.  Preadmission Screen Completed By:  Cleatrice Burke, 05/15/2018 10:25 AM ______________________________________________________________________   Discussed status with Dr. Letta Pate  on  05/15/2018  at 1025 and received telephone approval for admission today.  Admission Coordinator:  Cleatrice Burke, time [id number] Date  05/15/2018   Assessment/Plan: Diagnosis: 1. Does the need for close, 24 hr/day  Medical supervision in concert with the patient's rehab needs make it unreasonable for this patient to be served in a less intensive setting? Yes 2. Co-Morbidities requiring supervision/potential complications: afib, hx of PE, Urinary retention 3. Due to bladder management, bowel management, safety, skin/wound care, disease management, medication administration, pain management and patient education, does the patient require 24 hr/day rehab nursing? Yes 4. Does the patient require coordinated care of a physician, rehab nurse, PT (1-2 hrs/day, 5 days/week), OT (1-2 hrs/day, 5 days/week) and SLP (.5-1 hrs/day, 5 days/week) to address physical and functional deficits in the context of the above medical diagnosis(es)? Yes Addressing deficits in the following areas: balance, endurance, locomotion, strength, transferring, bowel/bladder control, bathing, dressing, feeding, grooming, toileting, cognition, speech, language, swallowing and psychosocial support 5. Can the patient actively participate in an intensive therapy program of at least 3 hrs of therapy 5 days a week? Yes 6. The potential for patient to make measurable gains while on inpatient rehab is fair 7. Anticipated functional outcomes upon discharge from  inpatients are: min assist PT, min assist OT, min assist SLP 8. Estimated rehab length of stay to reach the above functional goals is: 14-18d 9. Anticipated D/C setting: Home 10. Anticipated post D/C treatments: Mobile therapy 11. Overall Rehab/Functional Prognosis: fair    RECOMMENDATIONS: This patient's condition is appropriate for continued rehabilitative care in the following setting: CIR Patient has agreed to participate in recommended program. Potentially Note that insurance prior authorization may be required for reimbursement for recommended care.  Comment:  Cleatrice Burke 05/15/2018

## 2018-05-15 ENCOUNTER — Inpatient Hospital Stay (HOSPITAL_COMMUNITY)
Admission: RE | Admit: 2018-05-15 | Discharge: 2018-06-11 | DRG: 092 | Disposition: A | Discharge status: Skilled Nursing Facility | Payer: Medicare Other | Source: Other Acute Inpatient Hospital | Attending: Physical Medicine & Rehabilitation | Admitting: Physical Medicine & Rehabilitation

## 2018-05-15 ENCOUNTER — Encounter: Payer: Self-pay | Admitting: *Deleted

## 2018-05-15 ENCOUNTER — Encounter (HOSPITAL_COMMUNITY): Payer: Self-pay | Admitting: *Deleted

## 2018-05-15 ENCOUNTER — Other Ambulatory Visit: Payer: Self-pay

## 2018-05-15 DIAGNOSIS — K219 Gastro-esophageal reflux disease without esophagitis: Secondary | ICD-10-CM | POA: Diagnosis present

## 2018-05-15 DIAGNOSIS — R482 Apraxia: Secondary | ICD-10-CM

## 2018-05-15 DIAGNOSIS — R338 Other retention of urine: Secondary | ICD-10-CM | POA: Diagnosis present

## 2018-05-15 DIAGNOSIS — I509 Heart failure, unspecified: Secondary | ICD-10-CM | POA: Diagnosis not present

## 2018-05-15 DIAGNOSIS — I5022 Chronic systolic (congestive) heart failure: Secondary | ICD-10-CM | POA: Diagnosis present

## 2018-05-15 DIAGNOSIS — Z9841 Cataract extraction status, right eye: Secondary | ICD-10-CM

## 2018-05-15 DIAGNOSIS — K22719 Barrett's esophagus with dysplasia, unspecified: Secondary | ICD-10-CM | POA: Diagnosis not present

## 2018-05-15 DIAGNOSIS — N401 Enlarged prostate with lower urinary tract symptoms: Secondary | ICD-10-CM | POA: Diagnosis present

## 2018-05-15 DIAGNOSIS — R1319 Other dysphagia: Secondary | ICD-10-CM | POA: Diagnosis present

## 2018-05-15 DIAGNOSIS — I482 Chronic atrial fibrillation, unspecified: Secondary | ICD-10-CM | POA: Diagnosis present

## 2018-05-15 DIAGNOSIS — S069X3S Unspecified intracranial injury with loss of consciousness of 1 hour to 5 hours 59 minutes, sequela: Secondary | ICD-10-CM

## 2018-05-15 DIAGNOSIS — S069X9S Unspecified intracranial injury with loss of consciousness of unspecified duration, sequela: Secondary | ICD-10-CM | POA: Diagnosis not present

## 2018-05-15 DIAGNOSIS — R569 Unspecified convulsions: Secondary | ICD-10-CM

## 2018-05-15 DIAGNOSIS — G4700 Insomnia, unspecified: Secondary | ICD-10-CM | POA: Diagnosis present

## 2018-05-15 DIAGNOSIS — I2510 Atherosclerotic heart disease of native coronary artery without angina pectoris: Secondary | ICD-10-CM | POA: Diagnosis present

## 2018-05-15 DIAGNOSIS — S066X9D Traumatic subarachnoid hemorrhage with loss of consciousness of unspecified duration, subsequent encounter: Secondary | ICD-10-CM

## 2018-05-15 DIAGNOSIS — Z85828 Personal history of other malignant neoplasm of skin: Secondary | ICD-10-CM

## 2018-05-15 DIAGNOSIS — I4729 Other ventricular tachycardia: Secondary | ICD-10-CM

## 2018-05-15 DIAGNOSIS — Z431 Encounter for attention to gastrostomy: Secondary | ICD-10-CM | POA: Diagnosis not present

## 2018-05-15 DIAGNOSIS — S069XAA Unspecified intracranial injury with loss of consciousness status unknown, initial encounter: Secondary | ICD-10-CM | POA: Diagnosis present

## 2018-05-15 DIAGNOSIS — Z79899 Other long term (current) drug therapy: Secondary | ICD-10-CM

## 2018-05-15 DIAGNOSIS — Z7902 Long term (current) use of antithrombotics/antiplatelets: Secondary | ICD-10-CM

## 2018-05-15 DIAGNOSIS — Z961 Presence of intraocular lens: Secondary | ICD-10-CM | POA: Diagnosis present

## 2018-05-15 DIAGNOSIS — N390 Urinary tract infection, site not specified: Secondary | ICD-10-CM | POA: Diagnosis not present

## 2018-05-15 DIAGNOSIS — R2689 Other abnormalities of gait and mobility: Secondary | ICD-10-CM | POA: Diagnosis present

## 2018-05-15 DIAGNOSIS — I495 Sick sinus syndrome: Secondary | ICD-10-CM | POA: Diagnosis present

## 2018-05-15 DIAGNOSIS — Z95810 Presence of automatic (implantable) cardiac defibrillator: Secondary | ICD-10-CM | POA: Diagnosis not present

## 2018-05-15 DIAGNOSIS — G40909 Epilepsy, unspecified, not intractable, without status epilepticus: Secondary | ICD-10-CM | POA: Diagnosis not present

## 2018-05-15 DIAGNOSIS — D62 Acute posthemorrhagic anemia: Secondary | ICD-10-CM | POA: Diagnosis present

## 2018-05-15 DIAGNOSIS — Z7982 Long term (current) use of aspirin: Secondary | ICD-10-CM

## 2018-05-15 DIAGNOSIS — R32 Unspecified urinary incontinence: Secondary | ICD-10-CM | POA: Diagnosis present

## 2018-05-15 DIAGNOSIS — Z86011 Personal history of benign neoplasm of the brain: Secondary | ICD-10-CM

## 2018-05-15 DIAGNOSIS — K449 Diaphragmatic hernia without obstruction or gangrene: Secondary | ICD-10-CM | POA: Diagnosis present

## 2018-05-15 DIAGNOSIS — J439 Emphysema, unspecified: Secondary | ICD-10-CM | POA: Diagnosis present

## 2018-05-15 DIAGNOSIS — E785 Hyperlipidemia, unspecified: Secondary | ICD-10-CM | POA: Diagnosis present

## 2018-05-15 DIAGNOSIS — Z87891 Personal history of nicotine dependence: Secondary | ICD-10-CM

## 2018-05-15 DIAGNOSIS — R4189 Other symptoms and signs involving cognitive functions and awareness: Secondary | ICD-10-CM | POA: Diagnosis present

## 2018-05-15 DIAGNOSIS — B9620 Unspecified Escherichia coli [E. coli] as the cause of diseases classified elsewhere: Secondary | ICD-10-CM | POA: Diagnosis not present

## 2018-05-15 DIAGNOSIS — R339 Retention of urine, unspecified: Secondary | ICD-10-CM | POA: Diagnosis not present

## 2018-05-15 DIAGNOSIS — I951 Orthostatic hypotension: Secondary | ICD-10-CM | POA: Diagnosis present

## 2018-05-15 DIAGNOSIS — I10 Essential (primary) hypertension: Secondary | ICD-10-CM | POA: Diagnosis present

## 2018-05-15 DIAGNOSIS — S069X4S Unspecified intracranial injury with loss of consciousness of 6 hours to 24 hours, sequela: Secondary | ICD-10-CM | POA: Diagnosis not present

## 2018-05-15 DIAGNOSIS — F09 Unspecified mental disorder due to known physiological condition: Secondary | ICD-10-CM | POA: Diagnosis not present

## 2018-05-15 DIAGNOSIS — I4819 Other persistent atrial fibrillation: Secondary | ICD-10-CM | POA: Diagnosis not present

## 2018-05-15 DIAGNOSIS — J449 Chronic obstructive pulmonary disease, unspecified: Secondary | ICD-10-CM | POA: Diagnosis present

## 2018-05-15 DIAGNOSIS — R159 Full incontinence of feces: Secondary | ICD-10-CM | POA: Diagnosis present

## 2018-05-15 DIAGNOSIS — S069X9A Unspecified intracranial injury with loss of consciousness of unspecified duration, initial encounter: Secondary | ICD-10-CM

## 2018-05-15 DIAGNOSIS — Z86718 Personal history of other venous thrombosis and embolism: Secondary | ICD-10-CM

## 2018-05-15 DIAGNOSIS — R29810 Facial weakness: Secondary | ICD-10-CM | POA: Diagnosis present

## 2018-05-15 DIAGNOSIS — I255 Ischemic cardiomyopathy: Secondary | ICD-10-CM | POA: Diagnosis present

## 2018-05-15 DIAGNOSIS — Z955 Presence of coronary angioplasty implant and graft: Secondary | ICD-10-CM

## 2018-05-15 DIAGNOSIS — F05 Delirium due to known physiological condition: Secondary | ICD-10-CM | POA: Diagnosis present

## 2018-05-15 DIAGNOSIS — N3000 Acute cystitis without hematuria: Secondary | ICD-10-CM | POA: Diagnosis not present

## 2018-05-15 DIAGNOSIS — Z8249 Family history of ischemic heart disease and other diseases of the circulatory system: Secondary | ICD-10-CM

## 2018-05-15 DIAGNOSIS — Z86711 Personal history of pulmonary embolism: Secondary | ICD-10-CM

## 2018-05-15 DIAGNOSIS — I472 Ventricular tachycardia: Secondary | ICD-10-CM | POA: Diagnosis not present

## 2018-05-15 DIAGNOSIS — G4733 Obstructive sleep apnea (adult) (pediatric): Secondary | ICD-10-CM | POA: Diagnosis present

## 2018-05-15 DIAGNOSIS — R4701 Aphasia: Secondary | ICD-10-CM

## 2018-05-15 DIAGNOSIS — Z96652 Presence of left artificial knee joint: Secondary | ICD-10-CM

## 2018-05-15 DIAGNOSIS — Z8673 Personal history of transient ischemic attack (TIA), and cerebral infarction without residual deficits: Secondary | ICD-10-CM

## 2018-05-15 DIAGNOSIS — G3189 Other specified degenerative diseases of nervous system: Secondary | ICD-10-CM | POA: Diagnosis not present

## 2018-05-15 DIAGNOSIS — G9389 Other specified disorders of brain: Secondary | ICD-10-CM | POA: Diagnosis present

## 2018-05-15 DIAGNOSIS — Z77090 Contact with and (suspected) exposure to asbestos: Secondary | ICD-10-CM | POA: Diagnosis present

## 2018-05-15 DIAGNOSIS — S069X0D Unspecified intracranial injury without loss of consciousness, subsequent encounter: Secondary | ICD-10-CM | POA: Diagnosis not present

## 2018-05-15 DIAGNOSIS — K2270 Barrett's esophagus without dysplasia: Secondary | ICD-10-CM | POA: Diagnosis present

## 2018-05-15 DIAGNOSIS — Z931 Gastrostomy status: Secondary | ICD-10-CM

## 2018-05-15 DIAGNOSIS — Z7901 Long term (current) use of anticoagulants: Secondary | ICD-10-CM

## 2018-05-15 DIAGNOSIS — I110 Hypertensive heart disease with heart failure: Secondary | ICD-10-CM | POA: Diagnosis present

## 2018-05-15 DIAGNOSIS — Z9842 Cataract extraction status, left eye: Secondary | ICD-10-CM

## 2018-05-15 DIAGNOSIS — S069X0S Unspecified intracranial injury without loss of consciousness, sequela: Secondary | ICD-10-CM | POA: Diagnosis not present

## 2018-05-15 LAB — URINALYSIS, ROUTINE W REFLEX MICROSCOPIC
Bilirubin Urine: NEGATIVE
GLUCOSE, UA: NEGATIVE mg/dL
Ketones, ur: NEGATIVE mg/dL
NITRITE: NEGATIVE
PH: 6 (ref 5.0–8.0)
Protein, ur: 30 mg/dL — AB
RBC / HPF: >50 RBC/hpf — ABNORMAL HIGH (ref 0–5)
SPECIFIC GRAVITY, URINE: 1.011 (ref 1.005–1.030)
WBC, UA: >50 WBC/hpf — ABNORMAL HIGH (ref 0–5)

## 2018-05-15 MED ORDER — ASPIRIN EC 81 MG PO TBEC
81.0000 mg | DELAYED_RELEASE_TABLET | Freq: Every day | ORAL | Status: DC
  Administered 2018-05-16 – 2018-06-11 (×26): 81 mg via ORAL
  Filled 2018-05-15 (×26): qty 1

## 2018-05-15 MED ORDER — METOPROLOL TARTRATE 25 MG PO TABS
37.5000 mg | ORAL_TABLET | Freq: Two times a day (BID) | ORAL | Status: DC
  Administered 2018-05-15 – 2018-06-06 (×41): 37.5 mg via ORAL
  Filled 2018-05-15 (×44): qty 1

## 2018-05-15 MED ORDER — VITAMIN D 1000 UNITS PO TABS
1000.0000 [IU] | ORAL_TABLET | Freq: Every day | ORAL | Status: DC
  Administered 2018-05-16 – 2018-06-11 (×26): 1000 [IU] via ORAL
  Filled 2018-05-15 (×20): qty 1

## 2018-05-15 MED ORDER — SENNA 8.6 MG PO TABS
2.0000 | ORAL_TABLET | Freq: Every day | ORAL | Status: DC
  Administered 2018-05-15 – 2018-06-09 (×24): 17.2 mg via ORAL
  Filled 2018-05-15 (×26): qty 2

## 2018-05-15 MED ORDER — POLYETHYLENE GLYCOL 3350 17 G PO PACK: 17.0000 g | PACK | Freq: Every day | ORAL | Status: DC | PRN

## 2018-05-15 MED ORDER — POLYETHYLENE GLYCOL 3350 17 G PO PACK
17.0000 g | PACK | Freq: Every day | ORAL | Status: DC
  Administered 2018-05-22 – 2018-06-11 (×17): 17 g via ORAL
  Filled 2018-05-15 (×24): qty 1

## 2018-05-15 MED ORDER — CALCIUM CARBONATE ANTACID 500 MG PO CHEW
1.0000 | CHEWABLE_TABLET | Freq: Every day | ORAL | Status: DC
  Administered 2018-05-16 – 2018-06-11 (×26): 200 mg via ORAL
  Filled 2018-05-15 (×26): qty 1

## 2018-05-15 MED ORDER — ALUM & MAG HYDROXIDE-SIMETH 200-200-20 MG/5ML PO SUSP: 30.0000 mL | ORAL | Status: DC | PRN

## 2018-05-15 MED ORDER — ACETAMINOPHEN 325 MG PO TABS
325.0000 mg | ORAL_TABLET | ORAL | Status: DC | PRN
  Administered 2018-05-16 – 2018-06-07 (×7): 650 mg via ORAL
  Filled 2018-05-15 (×7): qty 2

## 2018-05-15 MED ORDER — PROCHLORPERAZINE MALEATE 5 MG PO TABS: 5.0000 mg | ORAL_TABLET | Freq: Four times a day (QID) | ORAL | Status: DC | PRN

## 2018-05-15 MED ORDER — DIPHENHYDRAMINE HCL 12.5 MG/5ML PO ELIX
12.5000 mg | ORAL_SOLUTION | Freq: Four times a day (QID) | ORAL | Status: DC | PRN
  Administered 2018-05-17: 25 mg via ORAL
  Filled 2018-05-15: qty 10

## 2018-05-15 MED ORDER — FLEET ENEMA 7-19 GM/118ML RE ENEM: 1.0000 | ENEMA | Freq: Once | RECTAL | Status: DC | PRN

## 2018-05-15 MED ORDER — FINASTERIDE 5 MG PO TABS
5.0000 mg | ORAL_TABLET | Freq: Every day | ORAL | Status: DC
  Administered 2018-05-15 – 2018-06-10 (×26): 5 mg via ORAL
  Filled 2018-05-15 (×28): qty 1

## 2018-05-15 MED ORDER — LACOSAMIDE 50 MG PO TABS
200.0000 mg | ORAL_TABLET | Freq: Two times a day (BID) | ORAL | Status: DC
  Administered 2018-05-15 – 2018-06-11 (×53): 200 mg via ORAL
  Filled 2018-05-15 (×55): qty 4

## 2018-05-15 MED ORDER — NON FORMULARY: 5.0000 mg | Freq: Every day | Status: DC

## 2018-05-15 MED ORDER — BISACODYL 10 MG RE SUPP
10.0000 mg | Freq: Every day | RECTAL | Status: DC | PRN
  Administered 2018-05-27: 10 mg via RECTAL
  Filled 2018-05-15: qty 1

## 2018-05-15 MED ORDER — PROCHLORPERAZINE 25 MG RE SUPP: 12.5000 mg | Freq: Four times a day (QID) | RECTAL | Status: DC | PRN

## 2018-05-15 MED ORDER — PANTOPRAZOLE SODIUM 40 MG PO TBEC
40.0000 mg | DELAYED_RELEASE_TABLET | Freq: Every day | ORAL | Status: DC
  Administered 2018-05-16 – 2018-06-11 (×26): 40 mg via ORAL
  Filled 2018-05-15 (×26): qty 1

## 2018-05-15 MED ORDER — PROCHLORPERAZINE EDISYLATE 10 MG/2ML IJ SOLN: 5.0000 mg | Freq: Four times a day (QID) | INTRAMUSCULAR | Status: DC | PRN

## 2018-05-15 MED ORDER — DOCUSATE SODIUM 100 MG PO CAPS
100.0000 mg | ORAL_CAPSULE | Freq: Every day | ORAL | Status: DC
  Administered 2018-05-16 – 2018-06-11 (×23): 100 mg via ORAL
  Filled 2018-05-15 (×25): qty 1

## 2018-05-15 MED ORDER — METOPROLOL TARTRATE 12.5 MG HALF TABLET: 12.5000 mg | ORAL_TABLET | Freq: Two times a day (BID) | ORAL | Status: DC

## 2018-05-15 MED ORDER — GUAIFENESIN-DM 100-10 MG/5ML PO SYRP: 5.0000 mL | ORAL_SOLUTION | Freq: Four times a day (QID) | ORAL | Status: DC | PRN

## 2018-05-15 MED ORDER — TRAZODONE HCL 50 MG PO TABS
50.0000 mg | ORAL_TABLET | Freq: Every day | ORAL | Status: DC
  Administered 2018-05-15: 50 mg via ORAL
  Filled 2018-05-15: qty 1

## 2018-05-15 MED ORDER — MELATONIN 3 MG PO TABS
6.0000 mg | ORAL_TABLET | Freq: Every day | ORAL | Status: DC
  Administered 2018-05-16 – 2018-06-10 (×25): 6 mg via ORAL
  Filled 2018-05-15 (×28): qty 2

## 2018-05-15 MED ORDER — FREE WATER
200.0000 mL | Freq: Three times a day (TID) | Status: DC
  Administered 2018-05-15 – 2018-05-18 (×8): 200 mL

## 2018-05-15 MED ORDER — APIXABAN 5 MG PO TABS
5.0000 mg | ORAL_TABLET | Freq: Two times a day (BID) | ORAL | Status: DC
  Administered 2018-05-15 – 2018-06-11 (×53): 5 mg via ORAL
  Filled 2018-05-15 (×27): qty 1
  Filled 2018-05-15: qty 2
  Filled 2018-05-15 (×13): qty 1
  Filled 2018-05-15: qty 2
  Filled 2018-05-15: qty 1
  Filled 2018-05-15: qty 2
  Filled 2018-05-15 (×9): qty 1

## 2018-05-15 NOTE — Progress Notes (Signed)
Cristina Gong, RN  Rehab Admission Coordinator  Physical Medicine and Rehabilitation  PMR Pre-admission  Signed  Encounter Date:  05/14/2018       Related encounter: Documentation from 05/14/2018 in [facility]      Signed         Show:Clear all [x] Manual[x] Template[] Copied  Added by: [x] Cristina Gong, RN[x] Kirsteins, Luanna Salk, MD  [] Hover for details [employer] PMR Admission Coordinator Pre-Admission Assessment  Patient: [name] is an 77 y.o., male MRN: [id number] DOB: [birth year] Height:   Weight:    Insurance Information HMO:     PPO:      PCP:      IPA:      80/20:      OTHER: no HMO PRIMARY: Medicare a and b      Policy#: [id number]      Subscriber: pt Benefits:  Phone #: passport one online 05/14/2018 Eff. Date: 08/17/2003     Deduct: $1364      Out of Pocket Max: none      Life Max: none CIR: 100%      SNF: 20 full days Outpatient: 80%     Co-Pay: 20% Home Health: 100%      Co-Pay: none DME: 80%     Co-Pay: 20% Providers: pt choice  [facility]mitted [facility]. As of 05/15/2018 has used 70 medicare days  SECONDARY: CSI       Policy#: [id number]      Subscriber: pt Benefits:  Phone #: [phone]     Medicare supplement plan 56/38/7564 No pre cert required  Medicaid Application Date:       Case Manager:  Disability Application Date:       Case Worker:   Emergency Contact Information         Contact Information    Name Relation Home Work Mobile   Brea Significant other   [place] Son   [phone]   Delfino, Friesen Daughter   [phone]      Current Medical History  Patient Admitting Diagnosis: TBI  History of Present Illness: 77 year old with history of atrial fibrillation, chronic systolic CHF ( EF 09%), CAD, H/o PE and OSA who was initially seen at [facility] ED 03/08/2018  after falling from a ladder and sustaining a ICH.  Transferred to [facility] for acute care by air flight. Intubated in the ED and admitted to the Neuro ICU. Initial CT on 8/24 demonstrated a SDH and bifrontal cerebral contusion. Repeat head CT on 8/25 stable. MRI on 8/28 showed stable but extensive multi compartmental ICH. CT negative of spine for acute fractures . S/P EVD with drain removal on 8/31 and suture removal 9/17. He does have multiple non displaced rib fractures left 10 th and 11 th ribs, from a ladder fall June 2019. On 9/12 pt was transitioned form foley to condom catheter and suffered AKI due to urinary retention. EEG performed on 9/13 for left hand twitching, which showed left central parietal slowing with left hemispheric interictal broad sharps but no correlation to tremor. Pt on Vimpat for his history of left posterior meningioma resection in 2014. Hospital course at [facility] intermittently complicated by night time agitation, which resolved and Seroquel discontinued. For his history of a fib, Pradaxa was recommended to be held until after 10/5. On Lovenox for DVT prophylaxis. For HTN continued on amlodipine , metoprolol, clonidine and lisinopril ( new after AKI).  ON 04/03/2018 patient was admitted to [facility] for rehab. The patient's rehab course for notable trial of Amantadine which the family felt made him more restless, then the patient was decreased to 50 mg in the am and eventually discontinued on 10/7. The patient also started on Donepezil for his aphasia and cognitive deficits. Additionally, the trial of Sinemet for initiation and movement as he did not respond well to Amantadine which was started on 10/8 and per therapy team was noted to have positive effects. Patient failed multiple voiding trails and per discussion with Urology to keep foley in place until outpatient work up complete.  He had been progressing with his recovery, but was found unresponsive on 04/23/2018 and readmitted to acute hospital.   Initially ruled out stroke, his  initial Head CT showed no acute findings, but did show left temporal and right frontal encephalomalacia due to hemorrhagic contusions. Neurology was consulted. Felt possible seizure. He was placed on EEG which demonstrated focal L hemispheric cerebral dysfunction with intermittent epileptiform discharges in the left parietal region ( but no active seizures). Neurology started him on Vimpat. Neurology re consulted on 10/21 for there was a questionable seizure in the am. Neurology recommended continuing Vimpat, maintain normotension and to perform positional changes slowly.   On AIR, PM and R MD had started pt on Sinemet and pt's family felt that this was when a functional decline was observed. Sinemet was held as well as Aricept and flomax due to suspected intolerances.  Patient has demonstrated intermittent orthostasis and frequent exertional tachycardia up to the 160's. Throughout past week, patient exhibited some intermittent tachycardia with exertion and mild ortho stasis, but resolved with IV fluid administration. Appears possible Afib on telemetry as well as EKG. metoprolol adjusted.  Continued TED hose. Stopped flomax. CTA chest negative for PE.  Foley placed on 10/17. Stopped flomax due to orthostatics . Treating with finasteride.   Dysphagia with PEG in place but tolerating mechanical soft diet with honey thick liquids. Tube feeding have been discontinued. Would consider removing PEG.  Facial rash improved. Suspect seborrheic dermatitis and responded to a course of ketoconazole.   [name] medical record from [facility] has been reviewed by the rehabilitation admission coordinator and physician. Discussions with daughter, Antwane Grose, by phone , as well as Education officer, museum at [employer], Glori Bickers.  NIH Stroke scale: @FLOW ([id number]) [place]ow Coma Scale: )    Past Medical History      Past Medical History:  Diagnosis Date  . A-fib ([place])   . AICD (automatic  cardioverter/defibrillator) present 05/21/2016  . Arthritis    "back of my neck extending to back of head" (05/21/2016)  . Barrett's esophagus    Gets periodic endoscopies & biopsies. Coumadin has been held in the past for these precedures.   . Basal cell carcinoma    "scalp; chest; back"  . Blurred vision   . CAD (coronary artery disease)    S/P stenting of his LAD, circumflex & marginal branch as well as his RCA in 2008.  . Cardiomyopathy ([place])    Significant ischemic cardiomyopathy. ECHO 08/27/12: EF = 45-50% (Previous Echo revealed EF = 25%)  . Carotid artery occlusion    Carotid Doppler 08/27/12 SUMMARY - Bilateral ICAs: Demonstrated normal patency without evidence of a significant diamenter reduction, tortuosity or any other vascular abnormality. This was a Normal carotid duplex Doppler Evaluation.  . Cerebral atherosclerosis    Carotid Doppler 08/27/12 SUMMARY - Bilateral ICAs: Demonstrated normal patency without evidence of a significant  diamenter reduction, tortuosity or any other vascular abnormality. This was a Normal carotid duplex Doppler Evaluation.  . CHF (congestive heart failure) ([place])   . Chronic anticoagulation    For PE & PAF  . Claudication ([place])    right ABI of 0.84  . Coronary artery disease    Most recent cath by Dr. Gwenlyn Found 07/02/09 was remarkable for patent stents w/an 80% in-stent stenosis within the obtuse marginal branch stent, as well as 60% ostial RCA stenosis which was stented with a Taxus Liberte drug-eluting stent. Nuc stress test 09/03/12 was low risk & showed an inferolateral scar. & an EF of 39%  . Daily headache    "from the arthritis in back of neck" (05/21/2016)  . DVT (deep venous thrombosis) ([place]) 09/2010   "after knee replacement"  . GERD (gastroesophageal reflux disease)   . H/O mitral valve disorder   . History of hiatal hernia   . History of kidney stones   . History of stomach ulcers   . Hyperlipidemia   .  Hypertension   . Meningioma ([place]) 08/15/12   BrainLAB-guided left frontoparietal craniotomy w/removal of a meningioma.   . Obstructive sleep apnea    Sleep Study in 2011 AHI 3.2. "occasionally wear my mask" (05/21/2016)  . Paroxysmal atrial fibrillation ([facility])   . PSVT (paroxysmal supraventricular tachycardia) ([place])    2D ECHO 08/27/12: EF = 45-50% (Previous Echo revealed EF = 25%)  . Pulmonary embolism ([place]) 09/2010   "after knee replacement"  . Shortness of breath dyspnea   . Sick sinus syndrome ([place])   . TIA (transient ischemic attack)     Family History   family history includes Cancer in his mother and other; Cardiomyopathy in his sister.  Prior Rehab/Hospitalizations Has the patient had major surgery during 100 days prior to admission? Yes              Current Medications  apixaban Aspirin tums Cholecalciferol Colace Finasteride Ketoconazole Vimpat Iansoprazole Mg oxide Melatonin Metoprolol Polyethylene glycol Senna Flomax  Patients Current Diet:   Soft diet with honey thick liquids. Meds whole in puree. NO straws. Thin liquids recommended by SLP  Precautions / Restrictions Precautions Precautions: Fall Precautions/Special Needs: Behavior Restrictions Weight Bearing Restrictions: No   Has the patient had 2 or more falls or a fall with injury in the past year?No  Yes fall 6/19 from ladder with rib fractures and fall 02/26/18 in basement  Prior Activity Level Community (5-7x/wk): active and Independent; drove(outdorr activities and yardwork)  Prior Functional Level Do you want Prior Function Level of Independence: Independent Comments: no AD used from other? Self Care: Did the patient need help bathing, dressing, using the toilet or eating?  Independent  Indoor Mobility: Did the patient need assistance with walking from room to room (with or without device)? Independent  Stairs: Did the patient need assistance with internal or  external stairs (with or without device)? Independent  Functional Cognition: Did the patient need help planning regular tasks such as shopping or remembering to take medications? Independent  Home Assistive Devices / Equipment Home Assistive Devices/Equipment: None  Prior Device Use: Indicate devices/aids used by the patient prior to current illness, exacerbation or injury? None of the above  Prior Functional Level Comments: no AD used   Prior Functional Level Current Functional Level  Bed Mobility Independent Min assist without cues once bed sheets removed, min cues for pace and safety; pt continually reaches out to staff in attempt to pull self forward. Mod  verbal cues and pt able to use BUE to push hips toward EOB. 10/30  Transfers Independent  Max assist; B knees and feet blocked with rocking and trunk/pelvis facilitation into anterior weight shifts. Pt demonstrates retropulsion and resists facilitation. 10/30  Mobility - Walk/Wheelchair Independent 10/25 ambulated 40 feet and 25 feet with RW. Flexed posture, shuffling gait and narrow BOS. Distance limited by increased HR. 10/23 min assist transfer, min assist HHA 60 feet X 2. Easily distracted in the hallway. Min assist for mild unsteadiness  Mobility - Ambulation/Gait Independent 10/29 max assist with wheelchair manipulation  Upper Body Dressing Independent 10/30 max cues to initiate face washing and bathing UE. 10/27 Mod assist  Lower Body Dressing Independent 10/30 Max assist to total; 10/27 max   Grooming Independent Mod assist with mod cues 10/30  Eating/Drinking Independent Mod assist  Toilet Transfer Independent Mod to max assist  Bladder Continence Independent Indwelling catheter  Bowel Management  Independent Incontinent  Stair Climbing Independent Not attempted  Communication Independent Nonsensical speech  Memory Independent Total assist  Cooking/Meal Prep  independent      Housework  independent   Money  Management  managed his own finances   Driving  Independent    Gradual decline in function since readmit to acute hospital on 10/9 noted as demonstrated by therapy treatments. Was min assist overall with mod cues at AIR. Was preparing for discharge home in 8 days prior to return to acute hospital.  OT 10/24 pt able to ambulate min assist to BR with use of RW requiring min assist. toileting and lower body dressing mod assist. Limited by poor safety awareness and attention/cognition.  Speech 10/28 with OT, nonsensical speech throughout session and very restless decreased attention. Limited in adls for pt requires consistent verbal/tactile cuing for initiation.   SLP 05/12/2018 MBS Dysphagia diet, no straws, meds in puree. Do not crush. thin liquid. NDD3.. Mild oropharyngeal dysphagia. Premature spillage and reduced oral control with thin liquids via straw. Delayed initiation of swallow , mistiming leading to intermittent penetration with thin and nectar thick liquids. Penetrated material reached VF with thin liquids via straw, mostly expelled with completion of swallow. No significant penetration with thin liquids via cup or nectar thick liquids. Recommend thin liquids, no straw, mech soft altered meds 1 at a time in puree.   Special needs/care consideration BiPAP/CPAP n/a CPM n/a Continuous Drip IV n/a Dialysis n/a Life Vest n/a Oxygen n/a Special Bed n/a Trach Size n/a Wound Vac n/a Skin noted skin rash treated with medicated cream    Bowel mgmt: incontinent Bladder mgmt: indwelling catheter since 05/01/2018 Diabetic mgmt n/a Decreased safety awareness  Previous Home Environment Living Arrangements: Spouse/significant other  Lives With: Significant other(Janice, fiance) Available Help at Discharge: Family, Available 24 hours/day(daughter will arrange paid caregivers as needed) Type of Home: House Home Layout: Multi-level, Able to live on main level with bedroom/bathroom, Full  bath on main level Home Access: Stairs to enter Entrance Stairs-Rails: Right, Left Entrance Stairs-Number of Steps: 6 Bathroom Shower/Tub: Multimedia programmer: Standard Bathroom Accessibility: Yes How Accessible: Accessible via walker [place]: No  Discharge Living Setting Plans for Discharge Living Setting: [name] home, Lives with (comment)(fiance, [name]) Type of Home at Discharge: House Discharge Home Layout: Multi-level, Full bath on main level, Able to live on main level with bedroom/bathroom Discharge Home Access: Stairs to enter Entrance Stairs-Rails: Right, Left Entrance Stairs-Number of Steps: 6 Discharge Bathroom Shower/Tub: Walk-in shower Discharge Bathroom Toilet: Standard Discharge Bathroom Accessibility:  Yes How Accessible: Accessible via walker Does the patient have any problems obtaining your medications?: No  Social/Family/Support Systems Patient Roles: Partner, Parent Contact Information: Donold Marotto, daughter Anticipated Caregiver: family and hired caregivers as needed Anticipated Caregiver's Contact Information: [phone] Ability/Limitations of Caregiver: daughter works, but family takes shifts and will hire caregivers Caregiver Availability: 24/7 Discharge Plan Discussed with Primary Caregiver: Yes Is Caregiver In Agreement with Plan?: Yes Does Caregiver/Family have Issues with Lodging/Transportation while Pt is in Rehab?: No(family has remained at bedside 24/7 since initial admit)  Goals/Additional Needs Patient/Family Goal for Rehab: Min PT, OT, and SLP Expected length of stay: ELOS 10 to 14 days Pt/Family Agrees to Admission and willing to participate: Yes Program Orientation Provided & Reviewed with Pt/Caregiver Including Roles  & Responsibilities: Yes   I discussed with daughter, Luisangel Wainright, that goal is for discharge home with 24/7 family and hired caregiver support. Return to his familiar home environment with  family and follow up therapy with home health to progress to outpatient therapy .  Patient Condition: Patient will benefit from ongoing PT, OT, and SLP offered from the coordinated team approach during an Inpatient Acute Rehabilitation admission. He will receive 3 hours of therapy per day as well as Rehabilitation physician, Nursing and Care management interventions. Due to his ICH, the patient requires 24/day rehab nursing. He is currently mod to max assist with all mobility and ADLS. Patient can actively participate in an intensive therapy program of at least 3 hrs of therapy 5 days a week and make measurable gains while admitted. Anticipated discharge setting is Home with 24/7 minimal assistance of caregivers with home health therapy. Patient/Family have agreed to participate in the Acute Inpatient Rehabilitation program. We will admit today 05/15/2018.  Preadmission Screen Completed By:  Cleatrice Burke, 05/15/2018 10:25 AM ______________________________________________________________________   Discussed status with Dr. Letta Pate  on  05/15/2018  at 1025 and received telephone approval for admission today.  Admission Coordinator:  Cleatrice Burke, time 2353 Date  05/15/2018   Assessment/Plan: Diagnosis: 1. Does the need for close, 24 hr/day  Medical supervision in concert with the patient's rehab needs make it unreasonable for this patient to be served in a less intensive setting? Yes 2. Co-Morbidities requiring supervision/potential complications: afib, hx of PE, Urinary retention 3. Due to bladder management, bowel management, safety, skin/wound care, disease management, medication administration, pain management and patient education, does the patient require 24 hr/day rehab nursing? Yes 4. Does the patient require coordinated care of a physician, rehab nurse, PT (1-2 hrs/day, 5 days/week), OT (1-2 hrs/day, 5 days/week) and SLP (.5-1 hrs/day, 5 days/week) to address  physical and functional deficits in the context of the above medical diagnosis(es)? Yes Addressing deficits in the following areas: balance, endurance, locomotion, strength, transferring, bowel/bladder control, bathing, dressing, feeding, grooming, toileting, cognition, speech, language, swallowing and psychosocial support 5. Can the patient actively participate in an intensive therapy program of at least 3 hrs of therapy 5 days a week? Yes 6. The potential for patient to make measurable gains while on inpatient rehab is fair 7. Anticipated functional outcomes upon discharge from inpatients are: min assist PT, min assist OT, min assist SLP 8. Estimated rehab length of stay to reach the above functional goals is: 14-18d 9. Anticipated D/C setting: Home 10. Anticipated post D/C treatments: [place] therapy 11. Overall Rehab/Functional Prognosis: fair    RECOMMENDATIONS: This patient's condition is appropriate for continued rehabilitative care in the following setting: CIR Patient has agreed to participate  in recommended program. Potentially Note that insurance prior authorization may be required for reimbursement for recommended care.  Comment:  Cleatrice Burke 05/15/2018        Cosigned by: Charlett Blake, MD at 05/15/2018 10:53 AM  Revision History

## 2018-05-15 NOTE — Progress Notes (Signed)
Patient ID: [name], male   DOB: 01-09-1941, 77 y.o.   MRN: [id number] Patient arrived via EMS from [facility]. Family arrived just before patient to the room. Patient and family oriented to room, nurse call system, fall prevention plan, safety plan, rehab schedule, and health resource notebook. Patient resting in bed with no complaints of pain, family at the bedside. Tele sitter placed for patient safety, family aware and understands the purpose. All questions answered at this time.

## 2018-05-15 NOTE — H&P (Signed)
Physical Medicine and Rehabilitation Admission H&P  CC: TBI   HPI: [name] is a 77 year old male with history of A. fib, CAD/chronic systolic CHF with EF 13% with ICD, A fib/SVT, Barrette's esophagus, history of PE,  OSA who was originally hospitalized at [facility] 03/08/2018 after TBI post fall with multifocal ICH/SAH treated with EVD. He had prolonged hospitalization and required PEG tube for nutritional support as well as foley due to urinary retention. pior to transfer to [place] rehab on 04/03/18. He was undergoing rehab with adjustments of multiple medications including Aricept, amantadine and Sinemet and was found down on 04/23/2018 after being unresponsive for prolonged period of time.  He was transferred to [facility] and Code stroke initiated for work up. CT head was negative for acute changes but showed left temporal and right frontal encephalomalacia due to hemorrhagic contusions.  CT also showed evidence of ventriculomegaly with question of normal pressure hydrocephalus and neurology follow-up at [facility] recommended after completion of rehab.  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    Hospital course significant for issues with orthostasis therefore amantadine, Aricept and Flomax was DC'd.  He is also had issues with exertional tachyarrhythmias due to A. fib with RVR.  He was started on metoprolol for rate control and  Apixaban has been resumed for stroke prophylaxis.  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  Recommendations are to have CT chest report in 3 to 6 months as well as referral to pulmonary service.  Foley dislodges? 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  He was unable to tolerate Flomax therefore placed on finasteride with recommendations for follow-up with outpatient urology or voiding trial once finasteride has had time to take effect.  Seborrheic dermatitis has resolved with a course of topical ketoconazole.  Bouts of agitation have resolved and Seroquel was weaned off.  His swallow function is improved and he has been advanced to dysphagia 3 with thin liquids.  CT head repeated 10/29 showing stable regions of encephalomalacia in bilateral frontal and temporal lobes left >right and global volume loss with stable ventriculomegaly.  Patient continues to be limited by cognitive deficits with weakness affecting ability to carry out ADLs as well as mobility.  Intensive TBI rehab program recommended due to functional decline.   Family is concerned about patient pulling at tubes.  Requesting Foley and PEG to be removed.  We discussed that finasteride takes months to take effect and he would likely require another urology placed Foley if the catheter is removed too quickly. We discussed that his oral intake will need to be monitored, also patient's ability to take pills by mouth will need to be assessed.  It is possible that his PEG can be removed during this hospitalization.   Review of Systems  Unable to perform ROS: Mental acuity  Psychiatric/Behavioral: The patient has insomnia.       Past Medical History:  Diagnosis Date  . A-fib ([place])   . AICD (automatic cardioverter/defibrillator) present 05/21/2016  . Arthritis    "back of my neck extending to back of head" (05/21/2016)  . Barrett's esophagus    Gets periodic endoscopies & biopsies. Coumadin has been held in the past for these precedures.   [name]  Basal cell carcinoma    "scalp; chest; back"  . Blurred vision   . CAD (coronary artery disease)    S/P stenting of his LAD, circumflex & marginal branch as well as his RCA in 2008.  . Cardiomyopathy ([place])    Significant ischemic cardiomyopathy. ECHO  08/27/12: EF = 45-50% (Previous Echo revealed EF = 25%)  . Carotid artery occlusion    Carotid Doppler 08/27/12 SUMMARY - Bilateral ICAs: Demonstrated normal patency without evidence of a significant diamenter reduction, tortuosity or any other vascular abnormality. This was a Normal carotid duplex Doppler Evaluation.  . Cerebral atherosclerosis    Carotid Doppler 08/27/12 SUMMARY - Bilateral ICAs: Demonstrated normal patency without evidence of a significant diamenter reduction, tortuosity or any other vascular abnormality. This was a Normal carotid duplex Doppler Evaluation.  . CHF (congestive heart failure) ([place])   . Chronic anticoagulation    For PE & PAF  . Claudication ([place])    right ABI of 0.84  . Coronary artery disease    Most recent cath by Dr. Gwenlyn Found 07/02/09 was remarkable for patent stents w/an 80% in-stent stenosis within the obtuse marginal branch stent, as well as 60% ostial RCA stenosis which was stented with a Taxus Liberte drug-eluting stent. Nuc stress test 09/03/12 was low risk & showed an inferolateral scar. & an EF of 39%  . Daily headache    "from the arthritis in back of neck" (05/21/2016)  . DVT (deep venous thrombosis) ([place]) 09/2010   "after knee replacement"  . GERD (gastroesophageal reflux disease)   . H/O mitral valve disorder   . History of hiatal hernia   . History of kidney stones   . History of stomach ulcers   . Hyperlipidemia   . Hypertension   . Meningioma ([place]) 08/15/12   BrainLAB-guided left frontoparietal craniotomy w/removal of a meningioma.   . Obstructive sleep apnea    Sleep Study in 2011 AHI 3.2. "occasionally wear my mask" (05/21/2016)  . Paroxysmal atrial fibrillation (H[facility])   . PSVT (paroxysmal supraventricular tachycardia) ([place])    2D ECHO 08/27/12: EF = 45-50% (Previous Echo revealed EF = 25%)  . Pulmonary embolism ([place]) 09/2010   "after knee replacement"  . Shortness of breath dyspnea   . Sick sinus syndrome ([place])   . TIA (transient ischemic  attack)     Past Surgical History:  Procedure Laterality Date  . BASAL CELL CARCINOMA EXCISION     "head X 2; back, chest" (05/21/2016)  . CARDIAC CATHETERIZATION     Past caths in 2008, 2006, 2005 & 2003  . CATARACT EXTRACTION W/ INTRAOCULAR LENS  IMPLANT, BILATERAL Bilateral 2005  . CORONARY ANGIOPLASTY WITH STENT PLACEMENT  07/02/09   Most recent cath by Dr. Gwenlyn Found 07/02/09 was remarkable for patent stents w/an 80% in-stent stenosis within the obtuse marginal branch stent, as well as 60% ostial RCA stenosis which was stented with a Taxus Liberte drug-eluting stent.   . CORONARY ANGIOPLASTY WITH STENT PLACEMENT     "I have 12 stents in my heart" (05/21/2016)  . CRANIOTOMY  08/15/2012   At [facility]. BrainLAB-guided left frontoparietal craniotomy w/removal of a meningioma.   . EP IMPLANTABLE DEVICE N/A 05/21/2016   Procedure: ICD Implant;  Surgeon: Evans Lance, MD;  Location: [place] [facility];  Service: Cardiovascular;  Laterality: N/A;  . INGUINAL HERNIA REPAIR Right 11/23/2014   Procedure: HERNIA REPAIR INGUINAL ADULT;  Surgeon: Rochel Brome, MD;  Location: [facility] ORS;  Service: General;  Laterality: Right;  .  INGUINAL HERNIA REPAIR Bilateral 2010-2016  . JOINT REPLACEMENT    . THROAT SURGERY     "laser for Barret's esophagus"  . TONSILLECTOMY    . TOTAL KNEE ARTHROPLASTY Left 09/2010   Surgery was complicated by PAF with a pulmonary embolism documented by CT angiogram.    Family History  Problem Relation Age of Onset  . Cancer Mother   . Cardiomyopathy Sister   . Cancer Other        Breast    Social History:  Lives with fiancee. Per  reports that he quit smoking about 39 years ago. His smoking use included cigarettes. He has a 35.00 pack-year smoking history. He quit smokeless tobacco use about 30 years ago. Per reports  he drinks about 28.0 standard drinks of alcohol per week. Per  reports that he does not use drugs.    Allergies: No Known Allergies    Medications Prior to  Admission  Medication Sig Dispense Refill  . acetaminophen (TYLENOL) 500 MG tablet Take 500 mg by mouth every 6 (six) hours as needed (for pain.).     [name] aspirin EC 81 MG tablet Take 81 mg by mouth daily.    [name] atorvastatin (LIPITOR) 80 MG tablet Take 80 mg by mouth every evening.     . calcium-vitamin D (OSCAL WITH D) 500-200 MG-UNIT per tablet Take 1 tablet by mouth 2 (two) times daily.     . carvedilol (COREG) 25 MG tablet Take 1 tablet (25 mg total) by mouth 2 (two) times daily with a meal. 180 tablet 1  . dabigatran (PRADAXA) 150 MG CAPS capsule Take 150 mg by mouth every 12 (twelve) hours.    [name] lisinopril (PRINIVIL,ZESTRIL) 10 MG tablet Take 10 mg by mouth every evening.     . Multiple Vitamins-Minerals (MULTIVITAMIN WITH MINERALS) tablet Take 1 tablet by mouth daily.    [name] omeprazole (PRILOSEC) 40 MG capsule Take 40 mg by mouth 2 (two) times daily.     . polyvinyl alcohol (LIQUIFILM TEARS) 1.4 % ophthalmic solution Place 1 drop into both eyes as needed for dry eyes.     . vitamin B-12 (CYANOCOBALAMIN) 1000 MCG tablet Take 1,000 mcg by mouth daily.      Drug Regimen Review  Drug regimen was reviewed and remains appropriate with no significant issues identified  Home:     Functional History:    Functional Status:  Mobility: Min assist for bed mobility Mod assist to get to edge of bed with moderate verbal cues due to distractibility Max assist +2 with multiple attempts to try to stand without success due to retropulsion as well as inability to tolerate correction.   ADL: Mod assist for grooming Mod assist for upper and lower body dressing  Cognition:    There were no vitals taken for this visit. Physical Exam  Nursing note and vitals reviewed. Constitutional: No distress.  Thin adult male, NAD.  GI:  PEG site C/D/I  Genitourinary:  Genitourinary Comments: Foley in place.   Neurological: He is alert.  Slow to initiate with limited verbal out put. Right facial  weakness. Confused language with  Jargon. He was able to state his name. He was able to follow occasional one step commands with perseverative behaviors and difficult to redirect. Restless and attempting to sit up and pull on tubes once exam completed.   Skin: He is not diaphoretic.  Patient does respond to gestural cues.  Patient fidgets with his sheets as well as with his G-tube  when his abdomen was examined.  He does have a abdominal binder. Motor strength difficult to formally assess but he appears to have at least 4 strength bilateral deltoid by stress of grip hip flexion extensor ankle dorsiflexor Sensation difficult to assess secondary to his language  No results found for this or any previous visit (from the past 48 hour(s)). No results found.     Medical Problem List and Plan: 1.  Decline in self-care and mobility as well as severe cognitive deficits secondary to bifrontal and bitemporal encephalomalacia following intracranial hemorrhage 2.  DVT Prophylaxis/Anticoagulation: Pharmaceutical: Other (comment)--Eliquis 3. Pain Management: tylenol prn.  4. Mood: LCSW to follow for evaluation and support.  5. Neuropsych: This patient is not capable of making decisions on his own behalf. 6. Skin/Wound Care: Venous pressure relief measures.  Moisturization as needed recurrent seborrhea 7. Fluids/Electrolytes/Nutrition: Monitor I's and O's.  Check BMET in am. 8. CAD with ICM/CHF/ICD: Monitor for symptoms with activity.  Monitor for signs of overload and check weights daily.  Continue aspirin and metoprolol 9. A fib with RVR: Monitor heart rate twice daily.  Continue metoprolol twice daily. 10.  Urinary retention: Finasteride 5 mg nightly.  Check UA/UCS-- question timing of repeat voiding trial 11.  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  Bouts of agitation/sundowning: Reported to have resolved and now off Seroquel.  Continue melatonin and  trazodone at nights.  Monitor sleep hygiene.  13.  History of Barrett's esophagus with dysphagia: Continue PPI 14.  Seizures?/ At risk for seizures: On Vimpat twice daily 15. OSA: CPAP  Post Admission Physician Evaluation: 1. Functional deficits secondary  to bifrontal and bitemporal intracranial hemorrhage resulting in encephalomalacia. 2. Patient admitted to receive collaborative, interdisciplinary care between the physiatrist, rehab nursing staff, and therapy team. 3. [name] level of medical complexity and substantial therapy needs in context of that medical necessity cannot be provided at a lesser intensity of care. 4. Patient has experienced substantial functional loss from his/her baseline.Judging by the patient's diagnosis, physical exam, and functional history, the patient has potential for functional progress which will result in measurable gains while on inpatient rehab.  These gains will be of substantial and practical use upon discharge in facilitating mobility and self-care at the household level. 5. Physiatrist will provide 24 hour management of medical needs as well as oversight of the therapy plan/treatment and provide guidance as appropriate regarding the interaction of the two. 6. 24 hour rehab nursing will assist in the management of  bladder management, bowel management, safety, skin/wound care, disease management, medication administration, pain management and patient education  and help integrate therapy concepts, techniques,education, etc. 7. PT will assess and treat for:pre gait, gait training, endurance , safety, equipment, neuromuscular re education  .  Goals are: supervision. 8. OT will assess and treat for ADLs, Cognitive perceptual skills, Neuromuscular re education, safety, endurance, equipment  .  Goals are: supervision.  9. SLP will assess and treat for memory, attention, problem-solving, apraxia, receptive a aphasia, swallowing  .  Goals are: supervision. 10. Case  Management and Social Worker will assess and treat for psychological issues and discharge planning. 11. Team conference will be held weekly to assess progress toward goals and to determine barriers to discharge. 12.  Patient will receive at least 3 hours of therapy per day at least 5 days per week. 13. ELOS and Prognosis: 18-21d fair   "I have personally performed a face to face diagnostic evaluation of this  patient.  Additionally, I have reviewed and concur with the physician assistant's documentation above." Charlett Blake M.D. [facility] FAAPM&R (Sports Med, Neuromuscular Med) Diplomate [place] Board of [place], PA-C 05/15/2018

## 2018-05-16 ENCOUNTER — Inpatient Hospital Stay (HOSPITAL_COMMUNITY): Payer: Medicare Other | Admitting: Physical Therapy

## 2018-05-16 ENCOUNTER — Inpatient Hospital Stay (HOSPITAL_COMMUNITY): Payer: Medicare Other

## 2018-05-16 ENCOUNTER — Inpatient Hospital Stay (HOSPITAL_COMMUNITY): Payer: Medicare Other | Admitting: Speech Pathology

## 2018-05-16 DIAGNOSIS — G3189 Other specified degenerative diseases of nervous system: Secondary | ICD-10-CM

## 2018-05-16 DIAGNOSIS — F09 Unspecified mental disorder due to known physiological condition: Secondary | ICD-10-CM

## 2018-05-16 DIAGNOSIS — S069X0S Unspecified intracranial injury without loss of consciousness, sequela: Secondary | ICD-10-CM

## 2018-05-16 DIAGNOSIS — G40909 Epilepsy, unspecified, not intractable, without status epilepticus: Secondary | ICD-10-CM

## 2018-05-16 DIAGNOSIS — S069X4S Unspecified intracranial injury with loss of consciousness of 6 hours to 24 hours, sequela: Secondary | ICD-10-CM

## 2018-05-16 DIAGNOSIS — I4819 Other persistent atrial fibrillation: Secondary | ICD-10-CM

## 2018-05-16 LAB — CBC WITH DIFFERENTIAL/PLATELET
ABS IMMATURE GRANULOCYTES: 0.02 10*3/uL (ref 0.00–0.07)
BASOS ABS: 0 10*3/uL (ref 0.0–0.1)
BASOS PCT: 1 %
EOS ABS: 0.1 10*3/uL (ref 0.0–0.5)
Eosinophils Relative: 2 %
HCT: 37.3 % — ABNORMAL LOW (ref 39.0–52.0)
Hemoglobin: 11.8 g/dL — ABNORMAL LOW (ref 13.0–17.0)
Immature Granulocytes: 0 %
Lymphocytes Relative: 23 %
Lymphs Abs: 1.2 10*3/uL (ref 0.7–4.0)
MCH: 32 pg (ref 26.0–34.0)
MCHC: 31.6 g/dL (ref 30.0–36.0)
MCV: 101.1 fL — AB (ref 80.0–100.0)
MONOS PCT: 10 %
Monocytes Absolute: 0.5 10*3/uL (ref 0.1–1.0)
NEUTROS ABS: 3.2 10*3/uL (ref 1.7–7.7)
NEUTROS PCT: 64 %
NRBC: 0 % (ref 0.0–0.2)
Platelets: 224 10*3/uL (ref 150–400)
RBC: 3.69 MIL/uL — AB (ref 4.22–5.81)
RDW: 16.5 % — AB (ref 11.5–15.5)
WBC: 5 10*3/uL (ref 4.0–10.5)

## 2018-05-16 LAB — COMPREHENSIVE METABOLIC PANEL
ALT: 16 U/L (ref 0–44)
ANION GAP: 5 (ref 5–15)
AST: 16 U/L (ref 15–41)
Albumin: 2.8 g/dL — ABNORMAL LOW (ref 3.5–5.0)
Alkaline Phosphatase: 74 U/L (ref 38–126)
BILIRUBIN TOTAL: 0.7 mg/dL (ref 0.3–1.2)
BUN: 9 mg/dL (ref 8–23)
CALCIUM: 8.9 mg/dL (ref 8.9–10.3)
CO2: 29 mmol/L (ref 22–32)
Chloride: 106 mmol/L (ref 98–111)
Creatinine, Ser: 0.65 mg/dL (ref 0.61–1.24)
GFR calc non Af Amer: >60 mL/min (ref >60)
Glucose, Bld: 93 mg/dL (ref 70–99)
POTASSIUM: 4 mmol/L (ref 3.5–5.1)
SODIUM: 140 mmol/L (ref 135–145)
TOTAL PROTEIN: 6.2 g/dL — AB (ref 6.5–8.1)

## 2018-05-16 LAB — URINALYSIS, ROUTINE W REFLEX MICROSCOPIC
BILIRUBIN URINE: NEGATIVE
GLUCOSE, UA: NEGATIVE mg/dL
Ketones, ur: NEGATIVE mg/dL
Nitrite: POSITIVE — AB
PH: 5 (ref 5.0–8.0)
Protein, ur: 100 mg/dL — AB
RBC / HPF: >50 RBC/hpf — ABNORMAL HIGH (ref 0–5)
SPECIFIC GRAVITY, URINE: 1.018 (ref 1.005–1.030)

## 2018-05-16 MED ORDER — ENSURE ENLIVE PO LIQD
237.0000 mL | Freq: Two times a day (BID) | ORAL | Status: DC
  Administered 2018-05-16 – 2018-06-11 (×47): 237 mL via ORAL

## 2018-05-16 MED ORDER — QUETIAPINE FUMARATE 25 MG PO TABS
25.0000 mg | ORAL_TABLET | Freq: Two times a day (BID) | ORAL | Status: DC | PRN
  Administered 2018-05-16 – 2018-05-20 (×5): 25 mg via ORAL
  Filled 2018-05-16 (×7): qty 1

## 2018-05-16 MED ORDER — NITROGLYCERIN 0.4 MG SL SUBL
SUBLINGUAL_TABLET | SUBLINGUAL | Status: AC
  Filled 2018-05-16: qty 1

## 2018-05-16 MED ORDER — AMINOPHYLLINE 25 MG/ML IV SOLN
INTRAVENOUS | Status: AC
  Filled 2018-05-16: qty 10

## 2018-05-16 MED ORDER — REGADENOSON 0.4 MG/5ML IV SOLN
INTRAVENOUS | Status: AC
  Filled 2018-05-16: qty 5

## 2018-05-16 MED ORDER — ADULT MULTIVITAMIN W/MINERALS CH
1.0000 | ORAL_TABLET | Freq: Every day | ORAL | Status: DC
  Administered 2018-05-16 – 2018-06-11 (×26): 1 via ORAL
  Filled 2018-05-16 (×27): qty 1

## 2018-05-16 MED ORDER — TRAZODONE HCL 50 MG PO TABS
50.0000 mg | ORAL_TABLET | Freq: Every day | ORAL | Status: DC
  Administered 2018-05-16 – 2018-06-01 (×17): 50 mg via ORAL
  Filled 2018-05-16 (×17): qty 1

## 2018-05-16 NOTE — Progress Notes (Signed)
Patient returned to his room by therapist, he pulled his foley cath out in the PT gym. No signs of trauma noted. P Love, PA notified.

## 2018-05-16 NOTE — Discharge Instructions (Signed)

## 2018-05-16 NOTE — Progress Notes (Signed)
Orthopedic Tech Progress Note Patient Details:  [name] 05/12/1941 [id number] Ordered on wrong pt. Patient ID: [name], male   DOB: May 20, 1941, 77 y.o.   MRN: [id number]   Ladell Pier [facility] 05/16/2018, 12:39 PM

## 2018-05-16 NOTE — IPOC Note (Signed)
Overall Plan of Care [facility]) Patient Details Name: [name] MRN: [id number] DOB: 11-01-1940  Admitting Diagnosis: <principal problem not specified>  Hospital Problems: Active Problems:   TBI (traumatic brain injury) [facility])     Functional Problem List: Nursing Bladder, Bowel, Endurance, Medication Management, Pain, Perception, Safety, Sensory  PT Balance, Endurance, Motor, Safety  OT Balance, Endurance, Perception, Behavior, Motor, Safety, Nutrition, Cognition, Sensory, Edema, Pain, Skin Integrity  SLP    TR         Basic ADL's: OT Eating, Grooming, Bathing, Toileting, Dressing     Advanced  ADL's: OT       Transfers: PT Bed Mobility, Bed to Chair, Car, [employer], Futures trader, Metallurgist: PT Stairs, Emergency planning/management officer, Ambulation     Additional Impairments: OT None  SLP Swallowing, Communication, Social Cognition comprehension, expression Social Interaction, Problem Solving, Memory, Attention, Awareness  TR      Anticipated Outcomes Item Anticipated Outcome  Self Feeding set up  Swallowing  Min A   Basic self-care  (S)  Toileting  (S)   Bathroom Transfers (S)  Bowel/Bladder  Min assist  Transfers  supervision  Locomotion  supervision with gait  Communication  Mod A  Cognition  Mod A   Pain  <3 on a 1-10 pain scale  Safety/Judgment  min assist   Therapy Plan: PT Intensity: Minimum of 1-2 x/day ,45 to 90 minutes PT Frequency: 5 out of 7 days PT Duration Estimated Length of Stay: 21-24 days OT Intensity: Minimum of 1-2 x/day, 45 to 90 minutes OT Frequency: 5 out of 7 days OT Duration/Estimated Length of Stay: 21-25 days SLP Intensity: Minumum of 1-2 x/day, 30 to 90 minutes SLP Frequency: 3 to 5 out of 7 days SLP Duration/Estimated Length of Stay: 21-25 days    Team Interventions: Nursing Interventions Patient/Family Education, Pain Management, Dysphagia/Aspiration Precaution Training, Bladder Management, Medication  Management, Bowel Management, Psychosocial Support, Discharge Planning, Cognitive Remediation/Compensation  PT interventions Ambulation/gait training, Discharge planning, Functional mobility training, Therapeutic Activities, Balance/vestibular training, Neuromuscular re-education, Therapeutic Exercise, Wheelchair propulsion/positioning, Cognitive remediation/compensation, DME/adaptive equipment instruction, Pain management, Splinting/orthotics, UE/LE Strength taining/ROM, Community reintegration, Technical sales engineer stimulation, Patient/family education, IT trainer, UE/LE Coordination activities  OT Interventions Training and development officer, Community reintegration, Disease mangement/prevention, Functional electrical stimulation, Cognitive remediation/compensation, DME/adaptive equipment instruction, Discharge planning, Functional mobility training, Pain management, Psychosocial support, Skin care/wound managment, Therapeutic Activities, UE/LE Strength taining/ROM, UE/LE Coordination activities, Therapeutic Exercise, Splinting/orthotics, Self Care/advanced ADL retraining, Patient/family education, Neuromuscular re-education  SLP Interventions Cognitive remediation/compensation, Environmental controls, Internal/external aids, Speech/Language facilitation, Therapeutic Activities, Patient/family education, Functional tasks, Dysphagia/aspiration precaution training, Cueing hierarchy, Multimodal communication approach  TR Interventions    SW/CM Interventions Discharge Planning, Psychosocial Support, Patient/Family Education   Barriers to Discharge MD  Medical stability  Nursing Incontinence, Neurogenic Bowel & Bladder    PT      OT      SLP      SW       Team Discharge Planning: Destination: PT-Home ,OT- Home , SLP-Home Projected Follow-up: PT-Outpatient PT, OT-  Home health OT, 24 hour supervision/assistance, SLP-24 hour supervision/assistance, Home Health SLP Projected Equipment Needs:  PT-To be determined, OT- To be determined, SLP-None recommended by SLP Equipment Details: PT- , OT-  Patient/family involved in discharge planning: PT- Patient, Family member/caregiver,  OT-Patient, Family member/caregiver, SLP-Family member/caregiver, Patient unable/family or caregive not available  MD ELOS: 21-25 days Medical Rehab Prognosis:  Excellent Assessment: The patient has been admitted for CIR therapies  with the diagnosis of TBI. The team will be addressing functional mobility, strength, stamina, balance, safety, adaptive techniques and equipment, self-care, bowel and bladder mgt, patient and caregiver education, NMR, cognitive-behavioral rx. Goals have been set at supervision for mobility and self-care and mod assist for cognition. Meredith Staggers, MD, FAAPMR     See Team Conference Notes for weekly updates to the plan of care

## 2018-05-16 NOTE — Evaluation (Signed)
Physical Therapy Assessment and Plan  Patient Details  Name: [name] MRN: [id number] Date of Birth: 1940/09/02  PT Diagnosis: Abnormality of gait, Cognitive deficits, Difficulty walking, Impaired cognition and Muscle weakness Rehab Potential: Good ELOS: 21-24 days   Today's Date: 05/16/2018 PT Individual Time: 1045-1140 PT Individual Time Calculation (min): 55 min    Problem List:  Patient Active Problem List   Diagnosis Date Noted  . TBI (traumatic brain injury) ([place]) 05/15/2018  . ICD (implantable cardioverter-defibrillator) in place 05/21/2016  . COPD with emphysema ([place]) 04/10/2016  . Claudication of right lower extremity ([place]) 10/04/2015  . Preop cardiovascular exam 11/16/2014  . Palpitations 08/13/2013  . Chronic atrial fibrillation ([place]) 03/26/2013  . Hyperlipidemia 03/26/2013  . Barrett's esophagus 03/26/2013  . Obstructive sleep apnea 03/26/2013  . Cardiomyopathy, ischemic 12/19/2012  . HTN (hypertension) 12/19/2012  . Hx pulmonary embolism 12/19/2012  . NSVT (nonsustained ventricular tachycardia) ([place]) 10/23/2012  . CAD S/P multiple PCIs 10/23/2012  . PSVT (paroxysmal supraventricular tachycardia) ([place]) 09/30/2012  . Long term (current) use of anticoagulants 09/30/2012    Past Medical History:  Past Medical History:  Diagnosis Date  . A-fib ([place])   . AICD (automatic cardioverter/defibrillator) present 05/21/2016  . Arthritis    "back of my neck extending to back of head" (05/21/2016)  . Barrett's esophagus    Gets periodic endoscopies & biopsies. Coumadin has been held in the past for these precedures.   . Basal cell carcinoma    "scalp; chest; back"  . Blurred vision   . CAD (coronary artery disease)    S/P stenting of his LAD, circumflex & marginal branch as well as his RCA in 2008.  . Cardiomyopathy ([place])    Significant ischemic cardiomyopathy. ECHO 08/27/12: EF = 45-50% (Previous Echo revealed EF = 25%)  . Carotid artery occlusion    Carotid  Doppler 08/27/12 SUMMARY - Bilateral ICAs: Demonstrated normal patency without evidence of a significant diamenter reduction, tortuosity or any other vascular abnormality. This was a Normal carotid duplex Doppler Evaluation.  . Cerebral atherosclerosis    Carotid Doppler 08/27/12 SUMMARY - Bilateral ICAs: Demonstrated normal patency without evidence of a significant diamenter reduction, tortuosity or any other vascular abnormality. This was a Normal carotid duplex Doppler Evaluation.  . CHF (congestive heart failure) ([place])   . Chronic anticoagulation    For PE & PAF  . Claudication ([place])    right ABI of 0.84  . Coronary artery disease    Most recent cath by Dr. Gwenlyn Found 07/02/09 was remarkable for patent stents w/an 80% in-stent stenosis within the obtuse marginal branch stent, as well as 60% ostial RCA stenosis which was stented with a Taxus Liberte drug-eluting stent. Nuc stress test 09/03/12 was low risk & showed an inferolateral scar. & an EF of 39%  . Daily headache    "from the arthritis in back of neck" (05/21/2016)  . DVT (deep venous thrombosis) ([place]) 09/2010   "after knee replacement"  . GERD (gastroesophageal reflux disease)   . H/O mitral valve disorder   . History of hiatal hernia   . History of kidney stones   . History of stomach ulcers   . Hyperlipidemia   . Hypertension   . Meningioma ([place]) 08/15/12   BrainLAB-guided left frontoparietal craniotomy w/removal of a meningioma.   . Obstructive sleep apnea    Sleep Study in 2011 AHI 3.2. "occasionally wear my mask" (05/21/2016)  . Paroxysmal atrial fibrillation ([facility])   . PSVT (paroxysmal supraventricular tachycardia) ([place])  2D ECHO 08/27/12: EF = 45-50% (Previous Echo revealed EF = 25%)  . Pulmonary embolism ([place]) 09/2010   "after knee replacement"  . Shortness of breath dyspnea   . Sick sinus syndrome ([place])   . TIA (transient ischemic attack)    Past Surgical History:  Past Surgical History:  Procedure Laterality Date  .  BASAL CELL CARCINOMA EXCISION     "head X 2; back, chest" (05/21/2016)  . CARDIAC CATHETERIZATION     Past caths in 2008, 2006, 2005 & 2003  . CATARACT EXTRACTION W/ INTRAOCULAR LENS  IMPLANT, BILATERAL Bilateral 2005  . CORONARY ANGIOPLASTY WITH STENT PLACEMENT  07/02/09   Most recent cath by Dr. Gwenlyn Found 07/02/09 was remarkable for patent stents w/an 80% in-stent stenosis within the obtuse marginal branch stent, as well as 60% ostial RCA stenosis which was stented with a Taxus Liberte drug-eluting stent.   . CORONARY ANGIOPLASTY WITH STENT PLACEMENT     "I have 12 stents in my heart" (05/21/2016)  . CRANIOTOMY  08/15/2012   At [facility]. BrainLAB-guided left frontoparietal craniotomy w/removal of a meningioma.   . EP IMPLANTABLE DEVICE N/A 05/21/2016   Procedure: ICD Implant;  Surgeon: Evans Lance, MD;  Location: [facility];  Service: Cardiovascular;  Laterality: N/A;  . INGUINAL HERNIA REPAIR Right 11/23/2014   Procedure: HERNIA REPAIR INGUINAL ADULT;  Surgeon: Rochel Brome, MD;  Location: [facility] ORS;  Service: General;  Laterality: Right;  . INGUINAL HERNIA REPAIR Bilateral 2010-2016  . JOINT REPLACEMENT    . THROAT SURGERY     "laser for Barret's esophagus"  . TONSILLECTOMY    . TOTAL KNEE ARTHROPLASTY Left 09/2010   Surgery was complicated by PAF with a pulmonary embolism documented by CT angiogram.    Assessment & Plan Clinical Impression: Patient is a 77 y.o. year old male with recent admission to the hospital on  03/08/2018 after TBI post fall with multifocal ICH/SAH treated with EVD. He had prolonged hospitalization and required PEG tube for nutritional support as well as foley due to urinary retention. pior to transfer to [place] rehab on 04/03/18. He was undergoing rehab with adjustments of multiple medications including Aricept, amantadine and Sinemet and was found down on 04/23/2018 after being unresponsive for prolonged period of time.  He was transferred to [facility] and Code stroke  initiated for work up. CT head was negative for acute changes but showed left temporal and right frontal encephalomalacia due to hemorrhagic contusions.  CT also showed evidence of ventriculomegaly with question of normal pressure hydrocephalus and neurology follow-up at [facility] recommended after completion of rehab.  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    Hospital course significant for issues with orthostasis therefore amantadine, Aricept and Flomax was DC'd.  He is also had issues with exertional tachyarrhythmias due to A. fib with RVR.  He was started on metoprolol for rate control and  Apixaban has been resumed for stroke prophylaxis.  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  Recommendations are to have CT chest report in 3 to 6 months as well as referral to pulmonary service.  Foley dislodges? 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 He was unable to tolerate Flomax therefore placed  on finasteride with recommendations for follow-up with outpatient urology or voiding trial once finasteride has had time to take effect.  Seborrheic dermatitis has resolved with a course of topical ketoconazole.  Bouts of agitation have resolved and Seroquel was weaned off.  His swallow function is improved and he has been advanced to dysphagia 3 with thin liquids.  CT head repeated 10/29 showing stable regions of encephalomalacia in bilateral frontal and temporal lobes left >right and global volume loss with stable ventriculomegaly.  Patient continues to be limited by cognitive deficits with weakness affecting ability to carry out ADLs as well as mobility.  Intensive TBI rehab program recommended  due to functional decline.  Patient transferred to CIR on 05/15/2018 .   Patient currently requires mod with mobility secondary to muscle weakness, abnormal tone, decreased coordination and decreased motor planning, decreased attention, decreased awareness, decreased problem solving, decreased safety awareness, decreased memory and delayed processing and decreased sitting balance, decreased standing balance, decreased postural control and decreased balance strategies.  Prior to hospitalization, patient was independent  with mobility and lived with Significant other in a House home.  Home access is 4Stairs to enter.  Patient will benefit from skilled PT intervention to maximize safe functional mobility, minimize fall risk and decrease caregiver burden for planned discharge home with 24 hour supervision.  Anticipate patient will benefit from follow up OP at discharge.  PT - End of Session Activity Tolerance: Tolerates 30+ min activity with multiple rests Endurance Deficit: Yes PT Assessment Rehab Potential (ACUTE/IP ONLY): Good PT Patient demonstrates impairments in the following area(s): Balance;Endurance;Motor;Safety PT Transfers Functional Problem(s): Bed Mobility;Bed to Chair;Car;Furniture;Floor PT Locomotion Functional Problem(s): Stairs;Wheelchair Mobility;Ambulation PT Plan PT Intensity: Minimum of 1-2 x/day ,45 to 90 minutes PT Frequency: 5 out of 7 days PT Duration Estimated Length of Stay: 21-24 days PT Treatment/Interventions: Ambulation/gait training;Discharge planning;Functional mobility training;Therapeutic Activities;Balance/vestibular training;Neuromuscular re-education;Therapeutic Exercise;Wheelchair propulsion/positioning;Cognitive remediation/compensation;DME/adaptive equipment instruction;Pain management;Splinting/orthotics;UE/LE Strength taining/ROM;Community reintegration;Functional electrical stimulation;Patient/family education;Stair training;UE/LE Coordination activities PT  Transfers Anticipated Outcome(s): supervision PT Locomotion Anticipated Outcome(s): supervision with gait PT Recommendation Follow Up Recommendations: Outpatient PT Patient destination: Home Equipment Recommended: To be determined  Skilled Therapeutic Intervention Pt participated in skilled PT eval and was educated on PT POC and goals. Pt performs transfers initially with mod A, progressing to min A for stand pivot transfers without device.  Attempted w/c mobility, pt unable to problem solve w/c propulsion at this time due to decreased attention and decreased frustration tolerance. Pt performs gait with HHA 20 x 2 with mod A.  Shuffling gait pattern and manual facilitation for wt shifts.  Gait with RW with min A x 100' with min A for wt shifts, pt still with shuffling pattern.  Stair negotiation x 8 steps with bilat handrails with mod A, cues for foot placement and pt places toes only on stairs.  Simulated car transfer with mod A to sedan height car.  Pt requires cuing for attention to task especially in busy environment. Pt perseverating on playing with catheter tubing and requires max redirection.  Pt left with safety belt donned, family present.  PT Evaluation Precautions/Restrictions Precautions Precautions: Fall Restrictions Weight Bearing Restrictions: No Pain Pain Assessment Pain Score: 0-No pain Faces Pain Scale: No hurt Home Living/Prior Functioning Home Living Available Help at Discharge: Family;Personal care attendant;Available 24 hours/day Type of Home: House Home Access: Stairs to enter [employer] of Steps: 4 Entrance Stairs-Rails: Right;Left Home Layout: Multi-level;Able to live on main level with bedroom/bathroom;Full bath on main level  Lives With: Significant other Prior Function Level of Independence: Independent with basic ADLs;Independent with gait;Independent with transfers  Able to Take Stairs?: Yes Driving: Yes Vocation:  Retired  Associate Professor Overall Cognitive Status: Impaired/Different from baseline Attention: Focused Focused Attention: Impaired Focused Attention Impairment: Functional basic Awareness: Impaired Awareness Impairment: Intellectual impairment Behaviors: Perseveration;Restless Safety/Judgment: Impaired [employer] Scales of Cognitive Functioning: Confused/inappropriate/non-agitated Sensation Sensation Light Touch: Appears Intact Proprioception: Impaired Detail Proprioception Impaired Details: Impaired RLE;Impaired LLE Coordination Gross Motor Movements are Fluid and Coordinated: No Coordination and Movement Description: mild scissoring during gait Motor  Motor Motor: Motor perseverations;Abnormal postural alignment and control Motor - Skilled Clinical Observations: posterior lean  Mobility Bed Mobility Bed Mobility: Supine to Sit Supine to Sit: Moderate Assistance - Patient 50-74% Transfers Transfers: Sit to Stand;Stand Pivot Transfers Sit to Stand: Moderate Assistance - Patient 50-74% Stand Pivot Transfers: Moderate Assistance - Patient 50 - 74% Transfer (Assistive device): 1 person hand held assist Locomotion  Gait Ambulation: Yes Gait Assistance: Moderate Assistance - Patient 50-74% Gait Distance (Feet): 20 Feet Assistive device: 1 person hand held assist Stairs / Additional Locomotion Stairs: Yes Stairs Assistance: Moderate Assistance - Patient 50 - 74% Stair Management Technique: Two rails Number of Stairs: 8 Wheelchair Mobility Wheelchair Mobility: No  Trunk/Postural Assessment  Cervical Assessment Cervical Assessment: Within Functional Limits Thoracic Assessment Thoracic Assessment: (mild kyphosis) Lumbar Assessment Lumbar Assessment: (posterior pelvic tilt) Postural Control Postural Control: Deficits on evaluation Righting Reactions: delayed  Balance Balance Balance Assessed: Yes Dynamic Sitting Balance Sitting balance - Comments: min A static, mod  A dynamic Dynamic Standing Balance Dynamic Standing - Comments: mod/max A Extremity Assessment      RLE Assessment General Strength Comments: grossly 3/5 LLE Assessment General Strength Comments: grossly 3/5    Refer to Care Plan for Long Term Goals  Recommendations for other services: Neuropsych  Discharge Criteria: Patient will be discharged from PT if patient refuses treatment 3 consecutive times without medical reason, if treatment goals not met, if there is a change in medical status, if patient makes no progress towards goals or if patient is discharged from hospital.  The above assessment, treatment plan, treatment alternatives and goals were discussed and mutually agreed upon: by patient and by family  Leul Narramore 05/16/2018, 11:52 AM

## 2018-05-16 NOTE — Progress Notes (Signed)
Initial Nutrition Assessment  DOCUMENTATION CODES:   Not applicable  INTERVENTION:   -Initiate 72 hour calorie count per MD request; follow-up on Monday 05/19/18 for results -MVI with minerals daily -Ensure Enlive po BID, each supplement provides 350 kcal and 20 grams of protein  NUTRITION DIAGNOSIS:   Increased nutrient needs related to chronic illness(TBI) as evidenced by estimated needs.  GOAL:   Patient will meet greater than or equal to 90% of their needs  MONITOR:   PO intake, Supplement acceptance, Labs, Weight trends, Skin, I & O's  REASON FOR ASSESSMENT:   Consult Calorie Count  ASSESSMENT:   [name] is a 77 year old male with history of A. 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 He had prolonged hospitalization and required PEG tube for nutritional support as well as foley due to urinary retention. pior to transfer to [place] rehab on 04/03/18. He was undergoing rehab with adjustments of multiple medications including Aricept, amantadine and Sinemet and was found down on 04/23/2018 after being unresponsive for prolonged period of time.  He was transferred to [facility] and Code stroke initiated for work up. CT head was negative for acute changes but showed left temporal and right frontal encephalomalacia due to hemorrhagic contusions.  CT also showed evidence of ventriculomegaly with question of normal pressure hydrocephalus and neurology follow-up at [facility] recommended after completion of rehab.  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 Pt admitted to CIR with functional deficits secondary  to bifrontal and bitemporal intracranial hemorrhage resulting in encephalomalacia.  Pt admitted to CIR with functional  deficits secondary to bifrontal and bitemporal ICH resulting in encephalomalacia.   Attempted to speak with pt and family x 3, however, was either participating in therapies or in with other providers at time of visit.   Spoke with RN, who reports that pt has a great appetite- he "wolfed down" his breakfast this AM. RN confirms that pt has a PEG, however, PEG has not been used for several months PTA. PEG is currently being used for water flushes only (200 ml free water flush TID per PEG). Pt has been taking fluids, food, and medications this shift without difficulty per RN.   Unable to obtain further nutrition history from pt and family. Per review of chart, pt has undergone extensive hospitalization and rehabilitation related to TBI and ICH s/p fall starting on 03/08/18. Noted pt has experienced a 16.4% wt loss over the past 4 months, which is significant for time frame. Suspect is at risk for malnutrition, however, unable to identify malnutrition at this time given limited history.   Per MD notes, plan to initiate 72 hour calorie count. If calorie count is adequate, plan to potentially remove PEG on Monday, 05/19/18.  Calorie count per meal completion records:  Breakfast: 737 kcals and 24 grams protein Lunch: 551 kcals and 20 grams protein  Labs reviewed.   NUTRITION - FOCUSED PHYSICAL EXAM:    Most Recent Value  Orbital Region  Unable to assess  Upper Arm Region  Unable to assess  Thoracic and Lumbar Region  Unable to assess  Buccal Region  Unable to assess  Temple Region  Unable to assess  Clavicle Bone Region  Unable to assess  Clavicle and Acromion Bone Region  Unable to assess  Scapular Bone Region  Unable to assess  Dorsal Hand  Unable to assess  Patellar Region  Unable to assess  Anterior Thigh Region  Unable to assess  Posterior Calf Region  Unable to assess  Edema (RD Assessment)  Unable to assess  Hair  Unable to assess  Eyes  Unable to assess  Mouth  Unable to assess   Skin  Unable to assess  Nails  Unable to assess       Diet Order:   Diet Order            DIET DYS 3 Room service appropriate? Yes; Fluid consistency: Thin  Diet effective now              EDUCATION NEEDS:   No education needs have been identified at this time  Skin:  Skin Assessment: Skin Integrity Issues: Skin Integrity Issues:: Other (Comment) Other: MASD to coccyx  Last BM:  05/15/18  Height:   Ht Readings from Last 1 Encounters:  05/15/18 6' (1.829 m)    Weight:   Wt Readings from Last 1 Encounters:  05/16/18 67.9 kg    Ideal Body Weight:  80.9 kg  BMI:  Body mass index is 20.29 kg/m.  Estimated Nutritional Needs:   Kcal:  2100-2300  Protein:  100-115 grams  Fluid:  > 2.1 L    Ailee Pates A. Jimmye Norman, RD, LDN, CDE Pager: [phone] After hours Pager: [phone]

## 2018-05-16 NOTE — Progress Notes (Signed)
Inpatient Rehabilitation  Patient information reviewed and entered into eRehab system by Draxton Luu M. Narcissus Detwiler, M.A., CCC/SLP, PPS Coordinator.  Information including medical coding, functional ability and quality indicators will be reviewed and updated through discharge.    Per nursing patient was given "Data Collection Information Summary" for Patients in Inpatient Rehabilitation Facilities with attached "Privacy Act Statement-Health Care Records" upon admission.   

## 2018-05-16 NOTE — Progress Notes (Signed)
[facility] PHYSICAL MEDICINE & REHABILITATION PROGRESS NOTE   Subjective/Complaints: Had a fair night. In bathroom with OT. Restless  ROS: Limited due to cognitive/behavioral    Objective:   No results found. Recent Labs    05/16/18 0545  WBC 5.0  HGB 11.8*  HCT 37.3*  PLT 224   Recent Labs    05/16/18 0545  NA 140  K 4.0  CL 106  CO2 29  GLUCOSE 93  BUN 9  CREATININE 0.65  CALCIUM 8.9    Intake/Output Summary (Last 24 hours) at 05/16/2018 0842 Last data filed at 05/16/2018 0518 Gross per 24 hour  Intake -  Output 2675 ml  Net -2675 ml     Physical Exam: Vital Signs Blood pressure (!) 142/87, pulse 86, temperature 97.7 F (36.5 C), temperature source Axillary, resp. rate 18, height 6' (1.829 m), weight 67.9 kg, SpO2 96 %. Constitutional: No distress . Vital signs reviewed. HEENT: EOMI, oral membranes moist Neck: supple Cardiovascular: RRR without murmur. No JVD    Respiratory: CTA Bilaterally without wheezes or rales. Normal effort    GI: BS +, non-tender, non-distended, PEG in place Urology: foley Neurological: He is alert.  Restless, distracted. Can follow simple commands with tactile cues. Perseverates on foley. Occasionally answers questions with nonsensical words, sometimes garble. Motor 4/5 all 4 limbs at least. Reaches continuously at foley Skin: He is not diaphoretic.  Psych: internally distracted    Assessment/Plan: 1. Functional deficits secondary to TBI which require 3+ hours per day of interdisciplinary therapy in a comprehensive inpatient rehab setting.  Physiatrist is providing close team supervision and 24 hour management of active medical problems listed below.  Physiatrist and rehab team continue to assess barriers to discharge/monitor patient progress toward functional and medical goals  Care Tool:  Bathing              Bathing assist       Upper Body Dressing/Undressing Upper body dressing        Upper body assist       Lower Body Dressing/Undressing Lower body dressing            Lower body assist       Toileting Toileting    Toileting assist Assist for toileting: Maximal Assistance - Patient 25 - 49%     Transfers Chair/bed transfer  Transfers assist           Locomotion Ambulation   Ambulation assist              Walk 10 feet activity   Assist           Walk 50 feet activity   Assist           Walk 150 feet activity   Assist           Walk 10 feet on uneven surface  activity   Assist           Wheelchair     Assist               Wheelchair 50 feet with 2 turns activity    Assist            Wheelchair 150 feet activity     Assist          Medical Problem List and Plan: 1.  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  DVT Prophylaxis/Anticoagulation: Pharmaceutical: Other (comment)--Eliquis 3. Pain Management: tylenol prn.  4. Mood: LCSW to follow for evaluation and support.  5. Neuropsych: This patient is not capable of making decisions on his own behalf. 6. Skin/Wound Care: Venous pressure relief measures.  Moisturization as needed recurrent seborrhea 7. Fluids/Electrolytes/Nutrition: encourage PO  -intake not recorded yesterday  -will order calorie count  -I personally reviewed the patient's labs today.    -consider PEG out Monday if intake is reasonable this weekend 8. CAD with ICM/CHF/ICD: Monitor for symptoms with activity.  Monitor for signs of overload and check weights daily.  Continue aspirin and metoprolo  - Filed Weights   05/15/18 1330 05/16/18 0532  Weight: 69.3 kg 67.9 kg   l 9. A fib with RVR: Monitor heart rate twice daily.   Continue metoprolol twice daily. 10.  Urinary retention: Finasteride 5 mg nightly.    -consider repeat voiding trial in the next week  -UA positive  -UCX pending 11.  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  Bouts of agitation/sundowning: improved but still sundowning in the evening, especially now that he's in a new place  -attempt to maximize sleep  -consider ritalin trial to improve attention and restlessness although with afib would have to use low dose  -trazodone 50mg  at 2100  -continue sleep chart 13.  History of Barrett's esophagus with dysphagia: Continue PPI 14.  Seizures?/ At risk for seizures: On Vimpat twice daily 15. OSA: CPAP     LOS: 1 days A FACE TO FACE EVALUATION WAS PERFORMED  Meredith Staggers 05/16/2018, 8:42 AM

## 2018-05-16 NOTE — Evaluation (Signed)
Speech Language Pathology Assessment and Plan  Patient Details  Name: [name] MRN: [id number] Date of Birth: [birth year]  SLP Diagnosis: Aphasia;Dysphagia;Cognitive Impairments  Rehab Potential: Fair ELOS: 21-25 days    Today's Date: 05/16/2018  Session 1: SLP Individual Time: 0930-1000 SLP Individual Time Calculation (min): 30 min  Session 2: SLP Individual Time: 6270-3500 SLP Individual Time Calculation (min): 35 min   Problem List:  Patient Active Problem List   Diagnosis Date Noted  . TBI (traumatic brain injury) ([place]) 05/15/2018  . ICD (implantable cardioverter-defibrillator) in place 05/21/2016  . 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 Claudication of right lower extremity ([place]) 10/04/2015  . Preop cardiovascular exam 11/16/2014  . Palpitations 08/13/2013  . Chronic atrial fibrillation ([place]) 03/26/2013  . Hyperlipidemia 03/26/2013  . Barrett's esophagus 03/26/2013  . Obstructive sleep apnea 03/26/2013  . Cardiomyopathy, ischemic 12/19/2012  . HTN (hypertension) 12/19/2012  . Hx pulmonary embolism 12/19/2012  . NSVT (nonsustained ventricular tachycardia) ([place]) 10/23/2012  . CAD S/P multiple PCIs 10/23/2012  . PSVT (paroxysmal supraventricular tachycardia) ([place]) 09/30/2012  . Long term (current) use of anticoagulants 09/30/2012   Past Medical History:  Past Medical History:  Diagnosis Date  . A-fib ([place])   . AICD (automatic cardioverter/defibrillator) present 05/21/2016  . Arthritis    "back of my neck extending to back of head" (05/21/2016)  . Barrett's esophagus    Gets periodic endoscopies & biopsies. Coumadin has been held in the past for these precedures.   . Basal cell carcinoma    "scalp; chest; back"  . Blurred vision   . CAD (coronary artery disease)    S/P stenting of his LAD, circumflex & marginal branch as well as his RCA in 2008.  . Cardiomyopathy ([place])    Significant ischemic cardiomyopathy. ECHO 08/27/12: EF = 45-50% (Previous Echo  revealed EF = 25%)  . Carotid artery occlusion    Carotid Doppler 08/27/12 SUMMARY - Bilateral ICAs: Demonstrated normal patency without evidence of a significant diamenter reduction, tortuosity or any other vascular abnormality. This was a Normal carotid duplex Doppler Evaluation.  . Cerebral atherosclerosis    Carotid Doppler 08/27/12 SUMMARY - Bilateral ICAs: Demonstrated normal patency without evidence of a significant diamenter reduction, tortuosity or any other vascular abnormality. This was a Normal carotid duplex Doppler Evaluation.  . CHF (congestive heart failure) ([place])   . Chronic anticoagulation    For PE & PAF  . Claudication ([address])    right ABI of 0.84  . Coronary artery disease    Most recent cath by Dr. Gwenlyn Found 07/02/09 was remarkable for patent stents w/an 80% in-stent stenosis within the obtuse marginal branch stent, as well as 60% ostial RCA stenosis which was stented with a Taxus Liberte drug-eluting stent. Nuc stress test 09/03/12 was low risk & showed an inferolateral scar. & an EF of 39%  . Daily headache    "from the arthritis in back of neck" (05/21/2016)  . DVT (deep venous thrombosis) ([place]) 09/2010   "after knee replacement"  . GERD (gastroesophageal reflux disease)   . H/O mitral valve disorder   . History of hiatal hernia   . History of kidney stones   . History of stomach ulcers   . Hyperlipidemia   . Hypertension   . Meningioma ([place]) 08/15/12   BrainLAB-guided left frontoparietal craniotomy w/removal of a meningioma.   . Obstructive sleep apnea    Sleep Study in 2011 AHI 3.2. "occasionally wear my mask" (05/21/2016)  . Paroxysmal atrial fibrillation ([facility])   .  PSVT (paroxysmal supraventricular tachycardia) ([place])    2D ECHO 08/27/12: EF = 45-50% (Previous Echo revealed EF = 25%)  . Pulmonary embolism ([place]) 09/2010   "after knee replacement"  . Shortness of breath dyspnea   . Sick sinus syndrome ([place])   . TIA (transient ischemic attack)    Past Surgical History:   Past Surgical History:  Procedure Laterality Date  . BASAL CELL CARCINOMA EXCISION     "head X 2; back, chest" (05/21/2016)  . CARDIAC CATHETERIZATION     Past caths in 2008, 2006, 2005 & 2003  . CATARACT EXTRACTION W/ INTRAOCULAR LENS  IMPLANT, BILATERAL Bilateral 2005  . CORONARY ANGIOPLASTY WITH STENT PLACEMENT  07/02/09   Most recent cath by Dr. Gwenlyn Found 07/02/09 was remarkable for patent stents w/an 80% in-stent stenosis within the obtuse marginal branch stent, as well as 60% ostial RCA stenosis which was stented with a Taxus Liberte drug-eluting stent.   . CORONARY ANGIOPLASTY WITH STENT PLACEMENT     "I have 12 stents in my heart" (05/21/2016)  . CRANIOTOMY  08/15/2012   At [facility]. BrainLAB-guided left frontoparietal craniotomy w/removal of a meningioma.   . EP IMPLANTABLE DEVICE N/A 05/21/2016   Procedure: ICD Implant;  Surgeon: Evans Lance, MD;  Location: [facility];  Service: Cardiovascular;  Laterality: N/A;  . INGUINAL HERNIA REPAIR Right 11/23/2014   Procedure: HERNIA REPAIR INGUINAL ADULT;  Surgeon: Rochel Brome, MD;  Location: [facility] ORS;  Service: General;  Laterality: Right;  . INGUINAL HERNIA REPAIR Bilateral 2010-2016  . JOINT REPLACEMENT    . THROAT SURGERY     "laser for Barret's esophagus"  . TONSILLECTOMY    . TOTAL KNEE ARTHROPLASTY Left 09/2010   Surgery was complicated by PAF with a pulmonary embolism documented by CT angiogram.    Assessment / Plan / Recommendation Clinical Impression Patient is a 77 y.o. year old male with recent admission to the hospital on  03/08/2018 after TBI post fall with multifocal ICH/SAH treated with EVD. He had prolonged hospitalization and required PEG tube for nutritional support as well as foley due to urinary retention. pior to transfer to [facility] rehab on 04/03/18. He was undergoing rehab with adjustments of multiple medications including Aricept, amantadine and Sinemet and was found down on 04/23/2018 after being unresponsive  for prolonged period of time.  He was transferred to [facility] and Code stroke initiated for work up. CT head was negative for acute changes but showed left temporal and right frontal encephalomalacia due to hemorrhagic contusions.  CT also showed evidence of ventriculomegaly with question of normal pressure hydrocephalus and neurology follow-up at [facility] recommended after completion of rehab.  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  Hospital course significant for issues with orthostasis therefore amantadine, Aricept and Flomax was DC'd.  He is also had issues with exertional tachyarrhythmias due to A. fib with RVR.  He was started on metoprolol for rate control and  Apixaban has been resumed for stroke prophylaxis.  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  Recommendations are to have CT chest report in 3 to 6 months as well as referral to pulmonary service.  Foley dislodges? 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  He was unable to tolerate Flomax therefore placed on finasteride with recommendations for follow-up with outpatient urology or voiding trial once finasteride has had time to take effect.  Seborrheic dermatitis has resolved with a course of topical ketoconazole.  Bouts of agitation have resolved and Seroquel was weaned off.  His swallow function is improved and he has been advanced to dysphagia 3 with thin liquids.  CT head repeated 10/29 showing stable regions of encephalomalacia in bilateral frontal and temporal lobes left >right and global volume loss with stable ventriculomegaly.  Patient continues to be limited by cognitive deficits with weakness affecting ability to carry  out ADLs as well as mobility.  Intensive TBI rehab program recommended due to functional decline.  Patient transferred to CIR on 05/15/2018.  Patient demonstrates behaviors consistent with a [place] Level IV  characterized by restlessness, impulsivity, severely impaired sustained attention, problem solving, awareness and recall of functional and biographical information. Patient also demonstrates a moderate-severe aphasia impacting all four modalities of language characterized by decreased yes/no accuracy, impaired ability to follow basic commands, impaired repetition impaired naming with verbal expression characterized by jargon, perseveration and neologisms. [name] oral reading is also impaired at the word level.  Patient consumed Dys. 3 textures with thin liquids via cup without overt s/s of aspiration but required Max-Total A to self-monitor and correct right anterior spillage with intermittent oral holding noted due to poor awareness of bolus. Recommend patient continue current diet with full supervision from staff/family. Patient would benefit from skilled SLP intervention to maximize his cognitive-linguistic and swallowing function and overall functional independence prior to discharge.     Skilled Therapeutic Interventions          Session 1: Administered a cognitive-linguistic evaluation, please see above for details. Educated patient and family in regards to current cognitive-linguistic impairments and goals of skilled SLP intervention, family verbalized understanding.   Session 2: Administered a BSE, please see above for details. SLP facilitated session by providing total A for functional problem solving during tray set-up and to self-monitor and correct large bolus size due to impulsivity. Patient consumed meal without overt s/s of aspiration. Patient left upright in wheelchair with family present. Continue with current plan of care.    SLP Assessment  Patient will need skilled Speech  [place] Pathology Services during CIR admission    Recommendations  SLP Diet Recommendations: Dysphagia 3 (Mech soft);Thin Liquid Administration via: Cup;No straw Medication Administration: Whole meds with puree Supervision: Patient able to self feed;Staff to assist with self feeding;Full supervision/cueing for compensatory strategies Compensations: Minimize environmental distractions;Slow rate;Small sips/bites Postural Changes and/or Swallow Maneuvers: Out of bed for meals Oral Care Recommendations: Oral care BID Recommendations for Other Services: Neuropsych consult Patient destination: Home Follow up Recommendations: 24 hour supervision/assistance;Home Health SLP Equipment Recommended: None recommended by SLP    SLP Frequency 3 to 5 out of 7 days   SLP Duration  SLP Intensity  SLP Treatment/Interventions 21-25 days  Minumum of 1-2 x/day, 30 to 90 minutes  Cognitive remediation/compensation;Environmental controls;Internal/external aids;Speech/Language facilitation;Therapeutic Activities;Patient/family education;Functional tasks;Dysphagia/aspiration precaution training;Cueing hierarchy;Multimodal communication approach    Pain No indications of pain   Prior Functioning Type of Home: House  Lives With: Significant other Available Help at Discharge: Family;Personal care attendant;Available 24 hours/day Vocation: Retired  Industrial/product designer Term Goals: Week 1: SLP Short Term Goal 1 (Week 1): Patient will identify functional items from a field of 2 with 25% accuracy and Max A multimodal cues. SLP Short Term Goal 2 (Week 1): Patient will  follow basic commands within functional tasks with 25% accuracy and Max A multimodal cues. SLP Short Term Goal 3 (Week 1): Patient will answer basic yes/no questions with 50% accuracy and Mod A verbal cues.  SLP Short Term Goal 4 (Week 1): Patient will verbalize basic wants/needs at word/phrase level with Max A multimodal cues.  SLP Short Term Goal 5 (Week  1): Patient will consume current diet with minimal overt s/s of aspiration and Max A multimodal cues for use of small bites/sips.  SLP Short Term Goal 6 (Week 1): Patient will demonstrate sustained attention to functional task for 2 minutes with Max A multimodal cues.   Refer to Care Plan for Long Term Goals  Recommendations for other services: Neuropsych  Discharge Criteria: Patient will be discharged from SLP if patient refuses treatment 3 consecutive times without medical reason, if treatment goals not met, if there is a change in medical status, if patient makes no progress towards goals or if patient is discharged from hospital.  The above assessment, treatment plan, treatment alternatives and goals were discussed and mutually agreed upon: by family  Tramayne Sebesta, [place] 05/16/2018, 3:55 PM

## 2018-05-16 NOTE — Evaluation (Signed)
Occupational Therapy Assessment and Plan  Patient Details  Name: [name] MRN: [id number] Date of Birth: 1940/08/09  OT Diagnosis: cognitive deficits and muscle weakness (generalized) Rehab Potential: Rehab Potential (ACUTE ONLY): Fair ELOS: 21-25 days   Today's Date: 05/16/2018 OT Individual Time:Session 1: [id number] Session 2:  1420-1502 OT Individual Time Calculation (min): Session 1: 75 min Session 2:42 min     Problem List:  Patient Active Problem List   Diagnosis Date Noted  . TBI (traumatic brain injury) ([place]) 05/15/2018  . ICD (implantable cardioverter-defibrillator) in place 05/21/2016  . COPD with emphysema ([place]) 04/10/2016  . Claudication of right lower extremity ([place]) 10/04/2015  . Preop cardiovascular exam 11/16/2014  . Palpitations 08/13/2013  . Chronic atrial fibrillation ([place]) 03/26/2013  . Hyperlipidemia 03/26/2013  . Barrett's esophagus 03/26/2013  . Obstructive sleep apnea 03/26/2013  . Cardiomyopathy, ischemic 12/19/2012  . HTN (hypertension) 12/19/2012  . Hx pulmonary embolism 12/19/2012  . NSVT (nonsustained ventricular tachycardia) ([place]) 10/23/2012  . CAD S/P multiple PCIs 10/23/2012  . PSVT (paroxysmal supraventricular tachycardia) ([place]) 09/30/2012  . Long term (current) use of anticoagulants 09/30/2012    Past Medical History:  Past Medical History:  Diagnosis Date  . A-fib ([place])   . AICD (automatic cardioverter/defibrillator) present 05/21/2016  . Arthritis    "back of my neck extending to back of head" (05/21/2016)  . Barrett's esophagus    Gets periodic endoscopies & biopsies. Coumadin has been held in the past for these precedures.   . Basal cell carcinoma    "scalp; chest; back"  . Blurred vision   . CAD (coronary artery disease)    S/P stenting of his LAD, circumflex & marginal branch as well as his RCA in 2008.  . Cardiomyopathy ([place])    Significant ischemic cardiomyopathy. ECHO 08/27/12: EF = 45-50% (Previous Echo revealed EF = 25%)   . Carotid artery occlusion    Carotid Doppler 08/27/12 SUMMARY - Bilateral ICAs: Demonstrated normal patency without evidence of a significant diamenter reduction, tortuosity or any other vascular abnormality. This was a Normal carotid duplex Doppler Evaluation.  . Cerebral atherosclerosis    Carotid Doppler 08/27/12 SUMMARY - Bilateral ICAs: Demonstrated normal patency without evidence of a significant diamenter reduction, tortuosity or any other vascular abnormality. This was a Normal carotid duplex Doppler Evaluation.  . CHF (congestive heart failure) ([place])   . Chronic anticoagulation    For PE & PAF  . Claudication ([place])    right ABI of 0.84  . Coronary artery disease    Most recent cath by Dr. Gwenlyn Found 07/02/09 was remarkable for patent stents w/an 80% in-stent stenosis within the obtuse marginal branch stent, as well as 60% ostial RCA stenosis which was stented with a Taxus Liberte drug-eluting stent. Nuc stress test 09/03/12 was low risk & showed an inferolateral scar. & an EF of 39%  . Daily headache    "from the arthritis in back of neck" (05/21/2016)  . DVT (deep venous thrombosis) ([place]) 09/2010   "after knee replacement"  . GERD (gastroesophageal reflux disease)   . H/O mitral valve disorder   . History of hiatal hernia   . History of kidney stones   . History of stomach ulcers   . Hyperlipidemia   . Hypertension   . Meningioma ([place]) 08/15/12   BrainLAB-guided left frontoparietal craniotomy w/removal of a meningioma.   . Obstructive sleep apnea    Sleep Study in 2011 AHI 3.2. "occasionally wear my mask" (05/21/2016)  . Paroxysmal atrial fibrillation (  [place])   . PSVT (paroxysmal supraventricular tachycardia) ([place])    2D ECHO 08/27/12: EF = 45-50% (Previous Echo revealed EF = 25%)  . Pulmonary embolism ([place]) 09/2010   "after knee replacement"  . Shortness of breath dyspnea   . Sick sinus syndrome ([place])   . TIA (transient ischemic attack)    Past Surgical History:  Past Surgical  History:  Procedure Laterality Date  . BASAL CELL CARCINOMA EXCISION     "head X 2; back, chest" (05/21/2016)  . CARDIAC CATHETERIZATION     Past caths in 2008, 2006, 2005 & 2003  . CATARACT EXTRACTION W/ INTRAOCULAR LENS  IMPLANT, BILATERAL Bilateral 2005  . CORONARY ANGIOPLASTY WITH STENT PLACEMENT  07/02/09   Most recent cath by Dr. Gwenlyn Found 07/02/09 was remarkable for patent stents w/an 80% in-stent stenosis within the obtuse marginal branch stent, as well as 60% ostial RCA stenosis which was stented with a Taxus Liberte drug-eluting stent.   . CORONARY ANGIOPLASTY WITH STENT PLACEMENT     "I have 12 stents in my heart" (05/21/2016)  . CRANIOTOMY  08/15/2012   At [facility]. BrainLAB-guided left frontoparietal craniotomy w/removal of a meningioma.   . EP IMPLANTABLE DEVICE N/A 05/21/2016   Procedure: ICD Implant;  Surgeon: Evans Lance, MD;  Location: [facility];  Service: Cardiovascular;  Laterality: N/A;  . INGUINAL HERNIA REPAIR Right 11/23/2014   Procedure: HERNIA REPAIR INGUINAL ADULT;  Surgeon: Rochel Brome, MD;  Location: [facility];  Service: General;  Laterality: Right;  . INGUINAL HERNIA REPAIR Bilateral 2010-2016  . JOINT REPLACEMENT    . THROAT SURGERY     "laser for Barret's esophagus"  . TONSILLECTOMY    . TOTAL KNEE ARTHROPLASTY Left 09/2010   Surgery was complicated by PAF with a pulmonary embolism documented by CT angiogram.    Assessment & Plan Clinical Impression: [name] is a 77 year old male with history of A. fib, CAD/chronic systolic CHF with EF 00% with ICD, A fib/SVT, Barrette's esophagus, history of PE,  OSA who was originally hospitalized at [facility] 03/08/2018 after TBI post fall with multifocal ICH/SAH treated with EVD. He had prolonged hospitalization and required PEG tube for nutritional support as well as foley due to urinary retention. pior to transfer to [facility] re[facility] on 04/03/18. He was undergoing rehab with adjustments of multiple medications including  Aricept, amantadine and Sinemet and was found down on 04/23/2018 after being unresponsive for prolonged period of time.  He was transferred to [facility] and Code stroke initiated for work up. CT head was negative for acute changes but showed left temporal and right frontal encephalomalacia due to hemorrhagic contusions.  CT also showed evidence of ventriculomegaly with question of normal pressure hydrocephalus and neurology follow-up at [facility] recommended after completion of rehab.  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    Hospital course significant for issues with orthostasis therefore amantadine, Aricept and Flomax was DC'd.  He is also had issues with exertional tachyarrhythmias due to A. fib with RVR.  He was started on metoprolol for rate control and  Apixaban has been resumed for stroke prophylaxis.  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  Recommendations are to have CT chest report in 3 to 6 months as well as referral to pulmonary service.  Foley dislodges? Balloon deflated and foley  came out but he continued to have urinary retention with difficulty with catheterizatio and required urology for foley placement due to BPH. He was unable to tolerate Flomax therefore placed on finasteride with recommendations for follow-up with outpatient urology or voiding trial once finasteride has had time to take effect.  Seborrheic dermatitis has resolved with a course of topical ketoconazole.  Bouts of agitation have resolved and Seroquel was weaned off.  His swallow function is improved and he has been advanced to dysphagia 3 with thin liquids.  CT head repeated 10/29 showing stable regions of encephalomalacia in bilateral frontal and temporal lobes left >right and global volume loss with stable ventriculomegaly.   Patient continues to be limited by cognitive deficits with weakness affecting ability to carry out ADLs as well as mobility.  Intensive TBI rehab program recommended due to functional decline.   Family is concerned about patient pulling at tubes.  Requesting Foley and PEG to be removed.  We discussed that finasteride takes months to take effect and he would likely require another urology placed Foley if the catheter is removed too quickly. We discussed that his oral intake will need to be monitored, also patient's ability to take pills by mouth will need to be assessed.  It is possible that his PEG can be removed during this hospitalization. Patient transferred to CIR on 05/15/2018 .    Patient currently requires max with basic self-care skills secondary to muscle weakness, decreased initiation, decreased attention, decreased awareness, decreased problem solving, decreased safety awareness, decreased memory and delayed processing and decreased sitting balance, decreased standing balance, decreased postural control and decreased balance strategies.  Prior to hospitalization, patient could complete ADLs with independent .  Patient will benefit from skilled intervention to decrease level of assist with basic self-care skills prior to discharge home with care partner.  Anticipate patient will require 24 hour supervision and minimal physical assistance and follow up home health.  OT - End of Session Activity Tolerance: Tolerates 30+ min activity with multiple rests Endurance Deficit: Yes Endurance Deficit Description: generalized weakness OT Assessment Rehab Potential (ACUTE ONLY): Fair OT Patient demonstrates impairments in the following area(s): Balance;Endurance;Perception;Behavior;Motor;Safety;Nutrition;Cognition;Sensory;Edema;Pain;Skin Integrity OT Basic ADL's Functional Problem(s): Eating;Grooming;Bathing;Toileting;Dressing OT Transfers Functional Problem(s): Toilet;Tub/Shower OT Additional  Impairment(s): None OT Plan OT Intensity: Minimum of 1-2 x/day, 45 to 90 minutes OT Frequency: 5 out of 7 days OT Duration/Estimated Length of Stay: 21-25 days OT Treatment/Interventions: Balance/vestibular training;Community reintegration;Disease mangement/prevention;Functional electrical stimulation;Cognitive remediation/compensation;DME/adaptive equipment instruction;Discharge planning;Functional mobility training;Pain management;Psychosocial support;Skin care/wound managment;Therapeutic Activities;UE/LE Strength taining/ROM;UE/LE Coordination activities;Therapeutic Exercise;Splinting/orthotics;Self Care/advanced ADL retraining;Patient/family education;Neuromuscular re-education OT Self Feeding Anticipated Outcome(s): set up OT Basic Self-Care Anticipated Outcome(s): (S) OT Toileting Anticipated Outcome(s): (S) OT Bathroom Transfers Anticipated Outcome(s): (S) OT Recommendation Recommendations for Other Services: Neuropsych consult Patient destination: Home Follow Up Recommendations: Home health OT;24 hour supervision/assistance Equipment Recommended: To be determined   Skilled Therapeutic Intervention Session 1: Skilled evaluation completed. Pt's family edu re OT POC, ELOS, and condition insight.  Max cueing required for pt to follow commands throughout session. Attempted to get pt into shower, pt had incontinent BM and perseverated highly on cleaning BM in shower. Pt briefly washed UB and sprayed head with water but d/t highly unsafe behavior, convinced pt to get out of shower. Pt donned UB/LB clothing with max A and heavy cueing. Pt left sitting up in w/c with all needs met and family present. No indications of pain throughout session.   Session 2: Session focused on functional mobility and functional  activity tolerance. Pt completed 125 ft of functional mobility with RW into therapy gym. Tactile cues and manual facilitation provided for direction cues. Pt used NuStep for 10 min in order to  increase functional activity tolerance. While pt was on NuStep, extensive edu provided to pt's spouse re brain injury education. Pt returned to room and was left supine in bed with spouse present. No indications of pain throughout session.   OT Evaluation Precautions/Restrictions  Precautions Precautions: Fall Precaution Comments: Impulsive Restrictions Weight Bearing Restrictions: No General Chart Reviewed: Yes Family/Caregiver Present: Yes Pain Pain Assessment Pain Score: 0-No pain Faces Pain Scale: No hurt Home Living/Prior Functioning Home Living Family/patient expects to be discharged to:: Private residence Living Arrangements: Spouse/significant other Available Help at Discharge: Family, Personal care attendant, Available 24 hours/day Type of Home: House Home Access: Stairs to enter [employer] of Steps: 4 Entrance Stairs-Rails: Right, Left Home Layout: Multi-level, Able to live on main level with bedroom/bathroom, Full bath on main level Bathroom Shower/Tub: Multimedia programmer: Standard Bathroom Accessibility: Yes  Lives With: Significant other Prior Function Level of Independence: Independent with basic ADLs, Independent with gait, Independent with transfers  Able to Take Stairs?: Yes Driving: Yes Vocation: Retired Comments: no AD used Vision Baseline Vision/History: No visual deficits Patient Visual Report: Other (comment)(unable to assess) Vision Assessment?: Vision impaired- to be further tested in functional context Perception  Perception: Within Functional Limits Praxis Praxis: Impaired Praxis Impairment Details: Ideation;Initiation;Ideomotor;Motor planning;Perseveration Cognition Overall Cognitive Status: Impaired/Different from baseline Arousal/Alertness: Awake/alert Orientation Level: Person Day of Week: Incorrect Memory: Impaired Memory Impairment: Decreased long term memory;Decreased short term memory Decreased Long Term  Memory: Verbal basic Decreased Short Term Memory: Verbal basic Immediate Memory Recall: Sock;Blue;Bed Attention: Focused Focused Attention: Impaired Focused Attention Impairment: Functional basic;Verbal basic Awareness: Impaired Awareness Impairment: Intellectual impairment Problem Solving: Impaired Problem Solving Impairment: Verbal basic Executive Function: Reasoning;Self Correcting;Sequencing;Decision Making;Initiating;Organizing;Self Monitoring Reasoning: Impaired Reasoning Impairment: Verbal basic Sequencing: Impaired Sequencing Impairment: Verbal basic Organizing: Impaired Organizing Impairment: Verbal basic Decision Making: Impaired Decision Making Impairment: Verbal basic Initiating: Impaired Initiating Impairment: Verbal basic Self Monitoring: Impaired Self Monitoring Impairment: Verbal basic Self Correcting: Impaired Self Correcting Impairment: Verbal basic Behaviors: Perseveration;Restless;Impulsive;Physical agitation Safety/Judgment: Impaired [employer] Scales of Cognitive Functioning: Confused/agitated Sensation Sensation Light Touch: Appears Intact Proprioception: Impaired Detail Proprioception Impaired Details: Impaired RLE;Impaired LLE Coordination Gross Motor Movements are Fluid and Coordinated: No Fine Motor Movements are Fluid and Coordinated: No Coordination and Movement Description: mild scissoring during gait Finger Nose Finger Test: Unable to administer Motor  Motor Motor: Motor perseverations;Abnormal postural alignment and control Motor - Skilled Clinical Observations: posterior lean Mobility  Bed Mobility Bed Mobility: Supine to Sit;Sit to Supine Supine to Sit: Moderate Assistance - Patient 50-74% Sit to Supine: Moderate Assistance - Patient 50-74% Transfers Sit to Stand: Moderate Assistance - Patient 50-74%  Trunk/Postural Assessment  Cervical Assessment Cervical Assessment: Within Functional Limits Thoracic Assessment Thoracic  Assessment: Exceptions to WFL(mild kyphosis) Lumbar Assessment Lumbar Assessment: Exceptions to WFL(posterior pelvic tilt) Postural Control Postural Control: Deficits on evaluation Righting Reactions: delayed  Balance Balance Balance Assessed: Yes Static Sitting Balance Static Sitting - Balance Support: Feet supported Static Sitting - Level of Assistance: 4: Min assist Dynamic Sitting Balance Dynamic Sitting - Balance Support: Feet supported Dynamic Sitting - Level of Assistance: 3: Mod assist Sitting balance - Comments: min A static, mod A dynamic Static Standing Balance Static Standing - Balance Support: During functional activity;Right upper extremity supported Static Standing - Level of Assistance:  3: Mod assist Dynamic Standing Balance Dynamic Standing - Balance Support: During functional activity Dynamic Standing - Level of Assistance: 3: Mod assist Dynamic Standing - Comments: mod/max A Extremity/Trunk Assessment RUE Assessment RUE Assessment: Within Functional Limits General Strength Comments: Unable to formally test, but during ADL B UE observed to be [facility] for AROM/strength LUE Assessment LUE Assessment: Within Functional Limits General Strength Comments: Unable to formally test, but during ADL B UE observed to be [facility] for AROM/strength     Refer to Care Plan for Long Term Goals  Recommendations for other services: Neuropsych   Discharge Criteria: Patient will be discharged from OT if patient refuses treatment 3 consecutive times without medical reason, if treatment goals not met, if there is a change in medical status, if patient makes no progress towards goals or if patient is discharged from hospital.  The above assessment, treatment plan, treatment alternatives and goals were discussed and mutually agreed upon: by patient and by family  Curtis Sites 05/16/2018, 3:30 PM

## 2018-05-16 NOTE — Progress Notes (Signed)
Social Work  Social Work Assessment and Plan  Patient Details  Name: [name] MRN: [id number] Date of Birth: 1940-12-28  Today's Date: 05/16/2018  Problem List:  Patient Active Problem List   Diagnosis Date Noted  . TBI (traumatic brain injury) ([place]) 05/15/2018  . ICD (implantable cardioverter-defibrillator) in place 05/21/2016  . COPD with emphysema ([place]) 04/10/2016  . Claudication of right lower extremity ([place]) 10/04/2015  . Preop cardiovascular exam 11/16/2014  . Palpitations 08/13/2013  . Chronic atrial fibrillation ([place]) 03/26/2013  . Hyperlipidemia 03/26/2013  . Barrett's esophagus 03/26/2013  . Obstructive sleep apnea 03/26/2013  . Cardiomyopathy, ischemic 12/19/2012  . HTN (hypertension) 12/19/2012  . Hx pulmonary embolism 12/19/2012  . NSVT (nonsustained ventricular tachycardia) ([place]) 10/23/2012  . CAD S/P multiple PCIs 10/23/2012  . PSVT (paroxysmal supraventricular tachycardia) ([place]) 09/30/2012  . Long term (current) use of anticoagulants 09/30/2012   Past Medical History:  Past Medical History:  Diagnosis Date  . A-fib ([place])   . AICD (automatic cardioverter/defibrillator) present 05/21/2016  . Arthritis    "back of my neck extending to back of head" (05/21/2016)  . Barrett's esophagus    Gets periodic endoscopies & biopsies. Coumadin has been held in the past for these precedures.   . Basal cell carcinoma    "scalp; chest; back"  . Blurred vision   . CAD (coronary artery disease)    S/P stenting of his LAD, circumflex & marginal branch as well as his RCA in 2008.  . Cardiomyopathy ([place])    Significant ischemic cardiomyopathy. ECHO 08/27/12: EF = 45-50% (Previous Echo revealed EF = 25%)  . Carotid artery occlusion    Carotid Doppler 08/27/12 SUMMARY - Bilateral ICAs: Demonstrated normal patency without evidence of a significant diamenter reduction, tortuosity or any other vascular abnormality. This was a Normal carotid duplex Doppler Evaluation.  . Cerebral  atherosclerosis    Carotid Doppler 08/27/12 SUMMARY - Bilateral ICAs: Demonstrated normal patency without evidence of a significant diamenter reduction, tortuosity or any other vascular abnormality. This was a Normal carotid duplex Doppler Evaluation.  . CHF (congestive heart failure) ([place])   . Chronic anticoagulation    For PE & PAF  . Claudication ([place])    right ABI of 0.84  . Coronary artery disease    Most recent cath by Dr. Gwenlyn Found 07/02/09 was remarkable for patent stents w/an 80% in-stent stenosis within the obtuse marginal branch stent, as well as 60% ostial RCA stenosis which was stented with a Taxus Liberte drug-eluting stent. Nuc stress test 09/03/12 was low risk & showed an inferolateral scar. & an EF of 39%  . Daily headache    "from the arthritis in back of neck" (05/21/2016)  . DVT (deep venous thrombosis) ([place]) 09/2010   "after knee replacement"  . GERD (gastroesophageal reflux disease)   . H/O mitral valve disorder   . History of hiatal hernia   . History of kidney stones   . History of stomach ulcers   . Hyperlipidemia   . Hypertension   . Meningioma ([place]) 08/15/12   BrainLAB-guided left frontoparietal craniotomy w/removal of a meningioma.   . Obstructive sleep apnea    Sleep Study in 2011 AHI 3.2. "occasionally wear my mask" (05/21/2016)  . Paroxysmal atrial fibrillation ([facility])   . PSVT (paroxysmal supraventricular tachycardia) ([place])    2D ECHO 08/27/12: EF = 45-50% (Previous Echo revealed EF = 25%)  . Pulmonary embolism ([place]) 09/2010   "after knee replacement"  . Shortness of breath dyspnea   .  Sick sinus syndrome ([place])   . TIA (transient ischemic attack)    Past Surgical History:  Past Surgical History:  Procedure Laterality Date  . BASAL CELL CARCINOMA EXCISION     "head X 2; back, chest" (05/21/2016)  . CARDIAC CATHETERIZATION     Past caths in 2008, 2006, 2005 & 2003  . CATARACT EXTRACTION W/ INTRAOCULAR LENS  IMPLANT, BILATERAL Bilateral 2005  . CORONARY  ANGIOPLASTY WITH STENT PLACEMENT  07/02/09   Most recent cath by Dr. Gwenlyn Found 07/02/09 was remarkable for patent stents w/an 80% in-stent stenosis within the obtuse marginal branch stent, as well as 60% ostial RCA stenosis which was stented with a Taxus Liberte drug-eluting stent.   . CORONARY ANGIOPLASTY WITH STENT PLACEMENT     "I have 12 stents in my heart" (05/21/2016)  . CRANIOTOMY  08/15/2012   At [facility]. BrainLAB-guided left frontoparietal craniotomy w/removal of a meningioma.   . EP IMPLANTABLE DEVICE N/A 05/21/2016   Procedure: ICD Implant;  Surgeon: Evans Lance, MD;  Location: [place] [facility];  Service: Cardiovascular;  Laterality: N/A;  . INGUINAL HERNIA REPAIR Right 11/23/2014   Procedure: HERNIA REPAIR INGUINAL ADULT;  Surgeon: Rochel Brome, MD;  Location: [facility] ORS;  Service: General;  Laterality: Right;  . INGUINAL HERNIA REPAIR Bilateral 2010-2016  . JOINT REPLACEMENT    . THROAT SURGERY     "laser for Barret's esophagus"  . TONSILLECTOMY    . TOTAL KNEE ARTHROPLASTY Left 09/2010   Surgery was complicated by PAF with a pulmonary embolism documented by CT angiogram.   Social History:  reports that he quit smoking about 39 years ago. His smoking use included cigarettes. He has a 35.00 pack-year smoking history. He quit smokeless tobacco use about 30 years ago. He reports that he drinks about 28.0 standard drinks of alcohol per week. He reports that he does not use drugs.  Family / Support Systems Marital Status: Widow/Widower Patient Roles: Partner, Parent Spouse/Significant Other: fiance', Gasper Sells @ (C) [phone] ("together" ~ 5 yrs) Children: daughter, Oleg Oleson @ [phone];  son, Rochelle Nephew @ [phone] Other Supports: grandaughter, Tiajuana Amass;  cousins, Ronalee Belts and Rogelia Boga. Anticipated Caregiver: family and hired caregivers as needed Ability/Limitations of Caregiver: daughter works, but family takes shifts and will hire caregivers Caregiver  Availability: 24/7 Family Dynamics: [name] daughter and fianc at bedside and very encouraging of one another and patient.  Family here daily and very involved in therapy and are prepared to provide 24/7 upon discharge.  Social History Preferred language: English Religion: Christian Cultural Background: NA Read: Yes Write: Yes Employment Status: Retired Freight forwarder Issues: None Guardian/Conservator: None - per MD, pt is not yet capable of making decisions on his own behalf - defer to children.   Abuse/Neglect Abuse/Neglect Assessment Can Be Completed: Yes Physical Abuse: Denies Verbal Abuse: Denies Sexual Abuse: Denies Exploitation of patient/patient's resources: Denies Self-Neglect: Denies  Emotional Status Pt's affect, behavior adn adjustment status: Patient with significant confusion and only garbled speech.  Patient is unable to complete assessment interview due to cognitive deficits.  Patient very restless in his bed and making multiple attempts to get out while I speak with his daughter and fianc.  Will involve neuropsychology to assist with family education and assess patient as his cognition allows. Recent Psychosocial Issues: Patient had another fall recently which family feels may have contributed to overall decline. Pyschiatric History: None Substance Abuse History: None  Patient / Family Perceptions, Expectations & Goals Pt/Family  understanding of illness & functional limitations: Family and fianc with good, general understanding of patient's TBI.  All report that they have made attempts to educate themselves online of "what to expect". Premorbid pt/family roles/activities: Patient completely independent PTA at home and in the community. Anticipated changes in roles/activities/participation: Patient will require close 24/7 supervision upon discharge.  Ellene Route, family and private duty to be arranged. Pt/family expectations/goals: Her fianc, "I just hope  he can make gains here."  [employer]: None Premorbid Home Care/DME Agencies: None Transportation available at discharge: Yes Resource referrals recommended: Neuropsychology, Support group (specify)  Discharge Planning Living Arrangements: Alone(pt and fiance have separate homes but live very close to one another and spend most of their time together) Support Systems: Spouse/significant other, Children, Other relatives, Friends/neighbors Type of Residence: Private residence Insurance Resources: Chartered certified accountant Resources: Union, Family Support Financial Screen Referred: No Living Expenses: Own Money Management: Patient Does the patient have any problems obtaining your medications?: No Home Management: Patient independently manages her own PTA Patient/Family Preliminary Plans: Fianc plans to stay with patient at his home upon discharge.  She and family will cover 24/7 care for him. Social Work Anticipated Follow Up Needs: HH/OP, Support Group Expected length of stay: 21-25 days  Clinical Impression Very unfortunate, elderly gentleman who suffered a fall at home with the resulting TBI.  Significant cognitive deficits and communication issues as well as internal distraction.  Family completing assessment interview on his behalf.  Ellene Route and daughter at bedside and are very supportive of one another and agreed that they will provide 24/7 care to patient upon discharge.  Will involve neuropsychology most likely for family education and behavioral management for discharge.  Social work to follow for support and discharge planning needs as well.  Markala Sitts 05/16/2018, 5:05 PM

## 2018-05-16 NOTE — Progress Notes (Signed)
Physical Therapy Session Note  Patient Details  Name: HEZZIE KARIM MRN: [id number] Date of Birth: 11/01/1940  Today's Date: 05/16/2018 PT Individual Time: 1300-1327 PT Individual Time Calculation (min): 27 min   Short Term Goals: Week 1:  PT Short Term Goal 1 (Week 1): pt will perform functional transfers with min A PT Short Term Goal 2 (Week 1): pt will attend to therapeutic task wtih min A x 2 minutes  Skilled Therapeutic Interventions/Progress Updates:  Pt in w/c perseverating in unhooking safety belt.  PT unhooks safety belt after transport to the gym. Pt then begins to pull on catheter tubing.  PT attempts to give pt other objects to hold and manipulate but pt continues to perseverate on pulling on catheter, eventually pt pulls catheter out. RN made aware.  Pt then states need to use the bathroom. Pt performs gait with mod A, HHA, with +2 for safety with attempt to urinate at toilet. Pt unable to urinate. Pt then performs gait with mod A, HHA to bed. Pt left in bed with nursing present.  Therapy Documentation Precautions:  Precautions Precautions: Fall Restrictions Weight Bearing Restrictions: No Pain: Pain Assessment Pain Score: 0-No pain Faces Pain Scale: No hurt     Therapy/Group: Individual Therapy  Patrice Moates 05/16/2018, 1:28 PM

## 2018-05-17 ENCOUNTER — Inpatient Hospital Stay (HOSPITAL_COMMUNITY): Payer: Medicare Other | Admitting: Occupational Therapy

## 2018-05-17 ENCOUNTER — Inpatient Hospital Stay (HOSPITAL_COMMUNITY): Payer: Medicare Other | Admitting: Physical Therapy

## 2018-05-17 ENCOUNTER — Inpatient Hospital Stay (HOSPITAL_COMMUNITY): Payer: Medicare Other

## 2018-05-17 DIAGNOSIS — S069X0D Unspecified intracranial injury without loss of consciousness, subsequent encounter: Secondary | ICD-10-CM

## 2018-05-17 NOTE — Progress Notes (Signed)
Speech Language Pathology Daily Session Note  Patient Details  Name: [name] MRN: [id number] Date of Birth: 1941-07-05  Today's Date: 05/17/2018 SLP Individual Time: 1300-1315 SLP Individual Time Calculation (min): 15 min  Short Term Goals: Week 1: SLP Short Term Goal 1 (Week 1): Patient will identify functional items from a field of 2 with 25% accuracy and Max A multimodal cues. SLP Short Term Goal 2 (Week 1): Patient will follow basic commands within functional tasks with 25% accuracy and Max A multimodal cues. SLP Short Term Goal 3 (Week 1): Patient will answer basic yes/no questions with 50% accuracy and Mod A verbal cues.  SLP Short Term Goal 4 (Week 1): Patient will verbalize basic wants/needs at word/phrase level with Max A multimodal cues.  SLP Short Term Goal 5 (Week 1): Patient will consume current diet with minimal overt s/s of aspiration and Max A multimodal cues for use of small bites/sips.  SLP Short Term Goal 6 (Week 1): Patient will demonstrate sustained attention to functional task for 2 minutes with Max A multimodal cues.   Skilled Therapeutic Interventions:Skilled ST services focused on cognitive skills.Pt was asleep upon entering room, son present stating he had been asleep since he came at 11:00 am. SLP attempted to facilitated PO consumption of lunch try, however pt unable to open eyes given max A verbal/tactile cues ( cold wash cloth to face and chest rubs.) Pt was agitated grabbing therapist hands moving them away from pt, during attempts to gain arousal. Pt unable to respond verbally given yes/no questions. Pt's son provided lengthy history of several hospitalizations. Pt missed 30 minutes of session due to fatigue. Pt was left in room with call bell within reach and bed alarm set. SLP communicated with nursing staff medication given due to increase agitation, likely impacting alertness. SLP reccomends to continue skilled services.     Pain Pain Assessment Pain  Score: 0-No pain  Therapy/Group: Individual Therapy  Montie Swiderski  [facility] 05/17/2018, 1:27 PM

## 2018-05-17 NOTE — Progress Notes (Signed)
Physical Therapy Session Note  Patient Details  Name: [name] MRN: [id number] Date of Birth: May 29, 1941  Today's Date: 05/17/2018 PT Individual Time: 1440-1530 PT Individual Time Calculation (min): 50 min   Short Term Goals: Week 1:  PT Short Term Goal 1 (Week 1): pt will perform functional transfers with min A PT Short Term Goal 2 (Week 1): pt will attend to therapeutic task wtih min A x 2 minutes  Skilled Therapeutic Interventions/Progress Updates:    Patient received in bed, now more awake and alert but A&O to self only and was reoriented as appropriate by PT; patient quite restless but not agitated this afternoon, required Mod-max cues to attend to therapist and tasks this session. Nursing reports he had a hard night with very little sleep and per previous therapy notes from today has been more agitated than usual possibly due to lack of sleep. Fiance present in room and assisted in attempting to motivate patient to participate, extensive education provided to her regarding head injuries in the elderly and high correlation to falls, mechanisms of healing and recovery following brain injury, and importance of quiet and calm environment to assist in reducing agitation and improving attendance to tasks. Assisted nursing in holding patient's arms so they could replace foley cath (not included in billing). Following cath placement, he was able to sit up at EOB with MaxA and Max VC, some physical resistance from patient but once up was able to maintain sitting with min guard-MinA at EOB with feet supported. Max VC to consistently attend to basic task of eating lunch as he continues to perseverate on trying to play with catheter line and pushing at bedside table/items on table. Able to inconsistently correctly follow basic cues from PT for postural and balance corrections while sitting at EOB. Fiance assisted in feeding patient while he was sitting at EOB, educated regarding speech therapist's  recommendations for eating/drinking and pacing of such.  Able to maintain sitting at EOB with cues to attend to meal for approximately 15 minutes.He was returned to bed with maxA, fiance assisted in sliding him up in bed as well. He was left in bed with fiance present, all other needs met, and bed alarm activated this afternoon. Session limited by patient fatigue today. 25 minutes of skilled therapy time missed due to nursing care (replacing foley cath).   Therapy Documentation Precautions:  Precautions Precautions: Fall Precaution Comments: Impulsive Restrictions Weight Bearing Restrictions: No General: PT Amount of Missed Time (min): 25 Minutes PT Missed Treatment Reason: Nursing care Vital Signs:  Pain: Pain Assessment Pain Scale: Faces Pain Score: 0-No pain Faces Pain Scale: No hurt    Therapy/Group: Individual Therapy  Deniece Ree PT, DPT, CBIS  Supplemental Physical Therapist [facility]    Pager [phone] Acute Rehab Office [phone]   05/17/2018, 3:37 PM

## 2018-05-17 NOTE — Progress Notes (Signed)
Orthopedic Tech Progress Note Patient Details:  [name] 30-Aug-1940 [id number]  Ortho Devices Type of Ortho Device: Abdominal binder   Post Interventions Patient Tolerated: Well Instructions Provided: Care of device   [place] Pink 05/17/2018, 12:20 PM

## 2018-05-17 NOTE — Progress Notes (Signed)
Patient ID: [name], male   DOB: 10-30-1940, 77 y.o.   MRN: [id number]   [name] is a 77 y.o. male  who is admitted for CIR with functional deficits secondary to TBI Subjective: No new complaints.  Limited due to severe cognitive and behavioral issues.  Patient has a history of urinary retention and removed Foley catheter earlier.  Continues to have significant RV and in and out catheterizations have been very difficult.  Foley catheter to be reinserted  Objective: Vital signs in last 24 hours: Temp:  [98.1 F (36.7 C)-98.3 F (36.8 C)] 98.1 F (36.7 C) (11/02 0720) Pulse Rate:  [83-95] 83 (11/02 0720) Resp:  [18-19] 19 (11/02 0720) BP: (139-171)/(63-79) 144/63 (11/02 0720) SpO2:  [96 %-99 %] 96 % (11/02 0720) Weight:  [69 kg] 69 kg (11/02 0720) Weight change:  Last BM Date: 05/16/18  Intake/Output from previous day: 11/01 0701 - 11/02 0700 In: 600 [P.O.:600] Out: 950 [Urine:950]  Patient Vitals for the past 24 hrs:  BP Temp Temp src Pulse Resp SpO2 Weight  05/17/18 0720 (!) 144/63 98.1 F (36.7 C) Oral 83 19 96 % 69 kg  05/16/18 2214 (!) 171/79 - - 83 - - -  05/16/18 1532 139/70 98.3 F (36.8 C) - 95 18 99 % -     Physical Exam General: No apparent distress.  Daughter at bedside and feeding patient HEENT: not dry Lungs: Normal effort. Lungs clear to auscultation, no crackles or wheezes. Cardiovascular: Regular rate and rhythm, no edema Abdomen: S/NT/ND; BS(+) status post PEG Musculoskeletal:  unchanged Neurological: No new neurological deficits; restless.  Does not follow simple commands Extremities.  No edema Skin: clear  Aging changes Mental state: Alert, distracted.  Nonverbal    Lab Results: BMET    Component Value Date/Time   NA 140 05/16/2018 0545   K 4.0 05/16/2018 0545   CL 106 05/16/2018 0545   CO2 29 05/16/2018 0545   GLUCOSE 93 05/16/2018 0545   BUN 9 05/16/2018 0545   CREATININE 0.65 05/16/2018 0545   CREATININE 0.77 05/11/2016  0926   CALCIUM 8.9 05/16/2018 0545   GFRNONAA >60 05/16/2018 0545   GFRAA >60 05/16/2018 0545   CBC    Component Value Date/Time   WBC 5.0 05/16/2018 0545   RBC 3.69 (L) 05/16/2018 0545   HGB 11.8 (L) 05/16/2018 0545   HCT 37.3 (L) 05/16/2018 0545   PLT 224 05/16/2018 0545   MCV 101.1 (H) 05/16/2018 0545   MCH 32.0 05/16/2018 0545   MCHC 31.6 05/16/2018 0545   RDW 16.5 (H) 05/16/2018 0545   LYMPHSABS 1.2 05/16/2018 0545   MONOABS 0.5 05/16/2018 0545   EOSABS 0.1 05/16/2018 0545   BASOSABS 0.0 05/16/2018 0545     Medications: I have reviewed the patient's current medications.  Assessment/Plan:  Functional deficits with severe cognitive impairment secondary to [place] DVT prophylaxis.  Continue Eliquis Urinary retention.  I&O catheterizations have been technically difficult.  Will reinsert Foley catheter.  Consider voiding trial on Monday Coronary artery disease. stable History of atrial fibrillation.  Continue rate control and Eliquis    Length of stay, days: 2  Marletta Lor , MD 05/17/2018, 9:52 AM

## 2018-05-17 NOTE — Progress Notes (Signed)
Occupational Therapy Session Note  Patient Details  Name: [name] MRN: [id number] Date of Birth: 1941/01/18  Today's Date: 05/17/2018 OT Individual Time: 2952-8413 OT Individual Time Calculation (min): 48 min  27 minutes missed due to lethargy/decreased alertness  Short Term Goals: Week 1:  OT Short Term Goal 1 (Week 1): Pt will maintain sustained attention to simple ADL task with min cueing OT Short Term Goal 2 (Week 1): Pt will don shirt with moderate cueing  OT Short Term Goal 3 (Week 1): Pt will appropriately use toothbrush/comb with min verbal cueing   Skilled Therapeutic Interventions/Progress Updates:    Pt greeted in bed with dtr [name] present. He was unable to recall her name when given phonemic cuing. Started session with changing brief due to bowel incontinence. [name] assisted OT with rolling Rt>Lt for perihygiene completion. He appeared more receptive to following instruction provided by her, and was able to maintain grasp on bedrail to assist with pulling into sidelying. 2 helpers required for rolling due to pts rigidity. Pt with continuous BM, and therefore increased time required for hygiene. RN also in to change soiled sacral dressing. After, pt presented with increased somnolescence. Tried to increase alertness with cold wash cloth to face and sternal rub, however his eyes remained closed.Total A for LB dressing, including rolling Rt>Lt to elevate pants over hips. 2 assist for boosting him up in bed. [name] reported that pt had a rough night last night and barely slept. She felt this was partly due to his nervousness and feelings of anxiety. To increase pts feelings of calmness at rest, provided [name] with lavender to use via inhalation in room. Instructed her how to help pt use it and to keep it out of his reach when staff/family were not present. At end of session pt was left with [facility], call bell in lap, and bed alarm set. 27 minutes missed due to lethargy.   Therapy  Documentation Precautions:  Precautions Precautions: Fall Precaution Comments: Impulsive Restrictions Weight Bearing Restrictions: No Pain: No s/s pain during tx    ADL:       Therapy/Group: Individual Therapy  [name] A [name] 05/17/2018, 10:31 AM

## 2018-05-18 LAB — URINE CULTURE: Culture: >=100000 — AB

## 2018-05-18 NOTE — Progress Notes (Signed)
Patient ID: [name], male   DOB: 11-23-1940, 77 y.o.   MRN: [id number]   [name] is a 77 y.o. 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 A-fib ([place])   . AICD (automatic cardioverter/defibrillator) present 05/21/2016  . Arthritis    "back of my neck extending to back of head" (05/21/2016)  . Barrett's esophagus    Gets periodic endoscopies & biopsies. Coumadin has been held in the past for these precedures.   . Basal cell carcinoma    "scalp; chest; back"  . Blurred vision   . CAD (coronary artery disease)    S/P stenting of his LAD, circumflex & marginal branch as well as his RCA in 2008.  . Cardiomyopathy ([place])    Significant ischemic cardiomyopathy. ECHO 08/27/12: EF = 45-50% (Previous Echo revealed EF = 25%)  . Carotid artery occlusion    Carotid Doppler 08/27/12 SUMMARY - Bilateral ICAs: Demonstrated normal patency without evidence of a significant diamenter reduction, tortuosity or any other vascular abnormality. This was a Normal carotid duplex Doppler Evaluation.  . Cerebral atherosclerosis    Carotid Doppler 08/27/12 SUMMARY - Bilateral ICAs: Demonstrated normal patency without evidence of a significant diamenter reduction, tortuosity or any other vascular abnormality. This was a Normal carotid duplex Doppler Evaluation.  . CHF (congestive heart failure) ([place])   . Chronic anticoagulation    For PE & PAF  . Claudication ([place])    right ABI of 0.84  . Coronary artery disease    Most recent cath by Dr. Gwenlyn Found 07/02/09 was remarkable for patent stents w/an 80% in-stent stenosis within the obtuse marginal branch stent, as well as 60% ostial RCA stenosis which was stented with a Taxus Liberte drug-eluting stent. Nuc stress test 09/03/12 was low risk & showed an inferolateral scar. & an EF of 39%  . Daily headache    "from the arthritis in back of neck" (05/21/2016)  . DVT (deep venous  thrombosis) ([place]) 09/2010   "after knee replacement"  . GERD (gastroesophageal reflux disease)   . H/O mitral valve disorder   . History of hiatal hernia   . History of kidney stones   . History of stomach ulcers   . Hyperlipidemia   . Hypertension   . Meningioma ([place]) 08/15/12   BrainLAB-guided left frontoparietal craniotomy w/removal of a meningioma.   . Obstructive sleep apnea    Sleep Study in 2011 AHI 3.2. "occasionally wear my mask" (05/21/2016)  . Paroxysmal atrial fibrillation ([facility])   . PSVT (paroxysmal supraventricular tachycardia) ([place])    2D ECHO 08/27/12: EF = 45-50% (Previous Echo revealed EF = 25%)  . Pulmonary embolism ([place]) 09/2010   "after knee replacement"  . Shortness of breath dyspnea   . Sick sinus syndrome ([place])   . TIA (transient ischemic attack)      Subjective: No new complaints.  Foley catheter reinserted yesterday.  Remains confused but more verbal responsive today.  Still wanting to pull on his Foley catheter.  Objective: Vital signs in last 24 hours: Temp:  [98.5 F (36.9 C)-98.7 F (37.1 C)] 98.7 F (37.1 C) (11/03 0556) Pulse Rate:  [83-147] 147 (11/03 0558) Resp:  [16-18] 16 (11/03 0556) BP: (102-231)/(74-207) 152/133 (11/03 0601) SpO2:  [95 %-96 %] 96 % (11/03 0556) Weight:  [68 kg-68.7 kg] 68 kg (11/03 0610) Weight change:  Last BM Date: 05/17/18  Intake/Output from previous day: 11/02  0701 - 11/03 0700 In: 640 [P.O.:640] Out: 500 [Urine:500]  Patient Vitals for the past 24 hrs:  BP Temp Temp src Pulse Resp SpO2 Weight  05/18/18 0610 - - - - - - 68 kg  05/18/18 0601 (!) 152/133 - - - - - -  05/18/18 0558 (!) 231/207 - - (!) 147 - - -  05/18/18 0556 135/75 98.7 F (37.1 C) - 83 16 96 % -  05/17/18 2019 (!) 144/89 98.5 F (36.9 C) - (!) 101 18 95 % -  05/17/18 1607 102/74 98.5 F (36.9 C) Oral (!) 122 16 - 68.7 kg     Physical Exam General: No apparent distress confused.  Some verbal response HEENT: not dry Lungs: Normal  effort. Lungs clear to auscultation, no crackles or wheezes. Cardiovascular: Irregular rhythm Abdomen: S/NT/ND; BS(+) status post PEG.  Does respond to simple commands Musculoskeletal:  unchanged Neurological: No new neurological deficits Extremities.  No edema Skin: clear  Aging changes Mental state: Alert, oriented, cooperative    Lab Results: BMET    Component Value Date/Time   NA 140 05/16/2018 0545   K 4.0 05/16/2018 0545   CL 106 05/16/2018 0545   CO2 29 05/16/2018 0545   GLUCOSE 93 05/16/2018 0545   BUN 9 05/16/2018 0545   CREATININE 0.65 05/16/2018 0545   CREATININE 0.77 05/11/2016 0926   CALCIUM 8.9 05/16/2018 0545   GFRNONAA >60 05/16/2018 0545   GFRAA >60 05/16/2018 0545   CBC    Component Value Date/Time   WBC 5.0 05/16/2018 0545   RBC 3.69 (L) 05/16/2018 0545   HGB 11.8 (L) 05/16/2018 0545   HCT 37.3 (L) 05/16/2018 0545   PLT 224 05/16/2018 0545   MCV 101.1 (H) 05/16/2018 0545   MCH 32.0 05/16/2018 0545   MCHC 31.6 05/16/2018 0545   RDW 16.5 (H) 05/16/2018 0545   LYMPHSABS 1.2 05/16/2018 0545   MONOABS 0.5 05/16/2018 0545   EOSABS 0.1 05/16/2018 0545   BASOSABS 0.0 05/16/2018 0545   Medications: I have reviewed the patient's current medications.  Assessment/Plan:  Functional deficits with severe cognitive impairment secondary to [place] History of atrial fibrillation.  Continue efforts to rate control.  Ventricular response variable Urinary retention.  Continue Foley catheter at least through today.  Consider voiding trial tomorrow DVT prophylaxis.  Continue Eliquis CAD stable    Length of stay, days: 3  Marletta Lor , MD 05/18/2018, 8:53 AM

## 2018-05-19 ENCOUNTER — Inpatient Hospital Stay (HOSPITAL_COMMUNITY): Payer: Medicare Other | Admitting: Speech Pathology

## 2018-05-19 ENCOUNTER — Inpatient Hospital Stay (HOSPITAL_COMMUNITY): Payer: Medicare Other | Admitting: Physical Therapy

## 2018-05-19 ENCOUNTER — Inpatient Hospital Stay (HOSPITAL_COMMUNITY): Payer: Medicare Other

## 2018-05-19 DIAGNOSIS — Z431 Encounter for attention to gastrostomy: Secondary | ICD-10-CM

## 2018-05-19 MED ORDER — AMOXICILLIN 250 MG PO CAPS
500.0000 mg | ORAL_CAPSULE | Freq: Three times a day (TID) | ORAL | Status: AC
  Administered 2018-05-19 – 2018-05-25 (×21): 500 mg via ORAL
  Filled 2018-05-19 (×21): qty 2

## 2018-05-19 NOTE — Progress Notes (Signed)
Nutrition Follow-up  DOCUMENTATION CODES:   Severe malnutrition in context of chronic illness  INTERVENTION:   - Continue Ensure Enlive po BID, each supplement provides 350 kcal and 20 grams of protein  - Continue MVI with minerals daily  - d/c Calorie Count  - Continue to encourage adequate PO intake  NUTRITION DIAGNOSIS:   Severe Malnutrition related to chronic illness (CHF, TBI) as evidenced by severe fat depletion, severe muscle depletion.  New diagnosis  GOAL:   Patient will meet greater than or equal to 90% of their needs  Progressing  MONITOR:   PO intake, Supplement acceptance, Labs, Weight trends, Skin, I & O's  REASON FOR ASSESSMENT:   Consult Calorie Count  ASSESSMENT:   77 year old male with PMH of atrial fibrillation, CAD/chronic systolic CHF with EF 76% with ICD, Barrett's esophagus, history of PE, and OSA who was originally hospitalized at [facility] after TBI post fall with multifocal ICH/SAH treated with EVD. He had a prolonged hospitalization and required PEG tube for nutritional support. Pt transferred to [facility] on 04/03/18. Pt was found down on 04/23/18 after being unresponsive for a prolonged period of time. He was transferred to [facility] and Code Stroke initiated for workp. CT head was negative for acute changes but showed left temporal and right frontal encephalomalacia due to hemorrhagic contusions. CT also showed evidence of ventriculomegaly with question of normal pressure hydrocephalus. Pt admitted to CIR with functional deficits secondary to bifrontal and bitemporal intracranial hemorrhage resulting in encephalomalacia.  11/4 - PEG tube removed  Calorie count started 11/1 and ended today, 11/4.  Weight appears stable since admission with fluctuations between 147-154 lbs. Current weight 154 lbs, +2 lbs since admission.  Spoke with pt and family member at bedside. Pt reporting feeling hungry. SLP entering room at end of visit to eat  lunch with pt.  Pt states that he is drinking the Ensure Enlive oral nutrition supplements and has no flavor preference.  Meal Completion: 75-100% x last 8 meals  Medications reviewed and include: Tums daily, vitamin D 1000 units daily, Colace 100 mg daily, Ensure Enlive BID, MVI with minerals daily, Protonix 40 mg daily, Miralax daily, Senokot daily  Labs reviewed.  UOP: 1550 ml x 24 hours PEG output: 1175 ml x 24 hours I/O's: -4.8 L since admit Stool: 2 unmeasured occurrences  NUTRITION - FOCUSED PHYSICAL EXAM:    Most Recent Value  Orbital Region  Moderate depletion  Upper Arm Region  Severe depletion  Thoracic and Lumbar Region  Severe depletion  Buccal Region  Moderate depletion  Temple Region  Moderate depletion  Clavicle Bone Region  Severe depletion  Clavicle and Acromion Bone Region  Severe depletion  Scapular Bone Region  Unable to assess  Dorsal Hand  Moderate depletion  Patellar Region  Moderate depletion  Anterior Thigh Region  Severe depletion  Posterior Calf Region  Severe depletion  Edema (RD Assessment)  None  Hair  Reviewed  Eyes  Reviewed  Mouth  Reviewed  Skin  Reviewed  Nails  Reviewed       Diet Order:   Diet Order            DIET DYS 3 Room service appropriate? Yes; Fluid consistency: Thin  Diet effective now              EDUCATION NEEDS:   No education needs have been identified at this time  Skin:  Skin Assessment: Skin Integrity Issues: Other: MASD to coccyx  Last BM:  11/3 (medium type 6)  Height:   Ht Readings from Last 1 Encounters:  05/15/18 6' (1.829 m)    Weight:   Wt Readings from Last 1 Encounters:  05/19/18 70 kg    Ideal Body Weight:  80.9 kg  BMI:  Body mass index is 20.93 kg/m.  Estimated Nutritional Needs:   Kcal:  2100-2300  Protein:  100-115 grams  Fluid:  > 2.1 L    Gaynell Face, MS, RD, LDN Inpatient Clinical Dietitian Pager: [phone] Weekend/After Hours: [phone]

## 2018-05-19 NOTE — Progress Notes (Signed)
[place] PHYSICAL MEDICINE & REHABILITATION PROGRESS NOTE   Subjective/Complaints: Seemed to have better weekend. Up with PT in gym when I found him today. Ate extremely well this weekend.   ROS: Limited due to cognitive/behavioral   Objective:   No results found. No results for input(s): WBC, HGB, HCT, PLT in the last 72 hours. No results for input(s): NA, K, CL, CO2, GLUCOSE, BUN, CREATININE, CALCIUM in the last 72 hours.  Intake/Output Summary (Last 24 hours) at 05/19/2018 1244 Last data filed at 05/19/2018 1100 Gross per 24 hour  Intake 2883 ml  Output 2275 ml  Net 608 ml     Physical Exam: Vital Signs Blood pressure 122/83, pulse 95, temperature 98 F (36.7 C), resp. rate 17, height 6' (1.829 m), weight 70 kg, SpO2 100 %. Constitutional: No distress . Vital signs reviewed. HEENT: EOMI, oral membranes moist Neck: supple Cardiovascular: RRR without murmur. No JVD    Respiratory: CTA Bilaterally without wheezes or rales. Normal effort    GI: BS +, non-tender, non-distended  Neurological: He is alert.  Less restless. Follows commands. Slow to process more complex information however.  Motor 4/5 all 4 limbs at least. Reaches continuously at foley Skin: He is not diaphoretic.  Psych: internally distracted    Assessment/Plan: 1. Functional deficits secondary to TBI which require 3+ hours per day of interdisciplinary therapy in a comprehensive inpatient rehab setting.  Physiatrist is providing close team supervision and 24 hour management of active medical problems listed below.  Physiatrist and rehab team continue to assess barriers to discharge/monitor patient progress toward functional and medical goals  Care Tool:  Bathing    Body parts bathed by patient: Chest   Body parts bathed by helper: Buttocks, Front perineal area, Chest, Right arm, Left arm     Bathing assist Assist Level: Maximal Assistance - Patient 24 - 49%     Upper Body Dressing/Undressing Upper  body dressing   What is the patient wearing?: Pull over shirt    Upper body assist Assist Level: Contact Guard/Touching assist    Lower Body Dressing/Undressing Lower body dressing      What is the patient wearing?: Pants     Lower body assist Assist for lower body dressing: Maximal Assistance - Patient 25 - 49%     Toileting Toileting    Toileting assist Assist for toileting: Minimal Assistance - Patient > 75%     Transfers Chair/bed transfer  Transfers assist     Chair/bed transfer assist level: Moderate Assistance - Patient 50 - 74%     Locomotion Ambulation   Ambulation assist      Assist level: Moderate Assistance - Patient 50 - 74% Assistive device: Hand held assist Max distance: 75 ft   Walk 10 feet activity   Assist     Assist level: Moderate Assistance - Patient - 50 - 74% Assistive device: Hand held assist   Walk 50 feet activity   Assist    Assist level: 2 helpers      Walk 150 feet activity   Assist           Walk 10 feet on uneven surface  activity   Assist Walk 10 feet on uneven surfaces activity did not occur: Safety/medical concerns         Wheelchair     Assist Will patient use wheelchair at discharge?: Yes Type of Wheelchair: Manual    Wheelchair assist level: Dependent - Patient 0%      Wheelchair  50 feet with 2 turns activity    Assist        Assist Level: Dependent - Patient 0%   Wheelchair 150 feet activity     Assist          Medical Problem List and Plan: 1.  Decline in self-care and mobility as well as severe cognitive deficits secondary to bifrontal and bitemporal encephalomalacia following intracranial hemorrhage  -Continue CIR therapies including PT, OT, and SLP   -improved restlessness this weekend.  2.  DVT Prophylaxis/Anticoagulation: Pharmaceutical: Other (comment)--Eliquis 3. Pain Management: tylenol prn.  4. Mood: LCSW to follow for evaluation and support.  5.  Neuropsych: This patient is not capable of making decisions on his own behalf. 6. Skin/Wound Care: Venous pressure relief measures.  Moisturization as needed recurrent seborrhea 7. Fluids/Electrolytes/Nutrition: encourage PO  -pt ate well this weekend  -removed PEG today with traction. Area cleaned and dressed post removal.   -may resume diet at lunch 8. CAD with ICM/CHF/ICD: Monitor for symptoms with activity.  Monitor for signs of overload and check weights daily.  Continue aspirin and metoprolo  - Filed Weights   05/18/18 0610 05/18/18 1725 05/19/18 0551  Weight: 68 kg 67.1 kg 70 kg     -weights slightly elevated. But eating better. Appears volume balanced 9. A fib with RVR: Monitor heart rate twice daily.  Continue metoprolol twice daily. 10.  Urinary retention: Finasteride 5 mg nightly.    -voiding with foley out  -Urine + for 100k E coli: amoxicillin initiated today 11.  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  Bouts of agitation/sundowning: improved but still sundowning in the evening, especially now that he's in a new place  -maximize sleep  -foley/PEG out should help  -environmental mods  -trazodone 50mg  at 2100  -continue sleep chart 13.  History of Barrett's esophagus with dysphagia: Continue PPI 14.  Seizures?/ At risk for seizures: On Vimpat twice daily 15. OSA: CPAP     LOS: 4 days A FACE TO FACE EVALUATION WAS PERFORMED  Meredith Staggers 05/19/2018, 12:44 PM

## 2018-05-19 NOTE — Progress Notes (Signed)
Speech Language Pathology Daily Session Notes  Patient Details  Name: KAYRON HICKLIN MRN: [id number] Date of Birth: 10/28/1940  Today's Date: 05/19/2018   Session 1: SLP Individual Time: 1100-1200 SLP Individual Time Calculation (min): 60 min   Session 2: SLP Individual Time: 1430-1500 SLP Individual Time Calculation (min): 30 min  Short Term Goals: Week 1: SLP Short Term Goal 1 (Week 1): Patient will identify functional items from a field of 2 with 25% accuracy and Max A multimodal cues. SLP Short Term Goal 2 (Week 1): Patient will follow basic commands within functional tasks with 25% accuracy and Max A multimodal cues. SLP Short Term Goal 3 (Week 1): Patient will answer basic yes/no questions with 50% accuracy and Mod A verbal cues.  SLP Short Term Goal 4 (Week 1): Patient will verbalize basic wants/needs at word/phrase level with Max A multimodal cues.  SLP Short Term Goal 5 (Week 1): Patient will consume current diet with minimal overt s/s of aspiration and Max A multimodal cues for use of small bites/sips.  SLP Short Term Goal 6 (Week 1): Patient will demonstrate sustained attention to functional task for 2 minutes with Max A multimodal cues.   Skilled Therapeutic Interventions:  Session 1: Skilled treatment session focused on cognitive goals. Upon arrival, patient was awake in bed and agreeable to participate in treatment session. SLP facilitated session by providing Max A verbal cues for problem solving during a visual-constructional task (pipe tree). However, patient attended to task for ~25 minutes with Min A verbal cues for redirection. SLP also facilitated session by providing tray set-up and Max A verbal and tactile cues for use of small bites/sips due to impulsivity and problem solving with self-feeding. Patient consumed meal without overt s/s of aspiration. Recommend patient continue current diet. Patient left upright in bed with alarm and mitts on with all needs within reach.  Continue with current plan of care.   Session 2: Skilled treatment session focused on cognitive goals. Upon arrival, patient appeared restless and was attempting to get out of bed. SLP transferred patient to the wheelchair and eventually to the commode as patient reporting he had to "pee" despite his catheter. Patient attended to task and was able to have a large bowel movement with total A needed for peri care. Patient required total A for orientation with jargon noted. However, increased social responses were also noted this session. Patient remained restless in chair but participated in simple tasks that required physical activity (bean bag toss) with Min A verbal cues for initiation and attention to task. Patient left upright in wheelchair with alarm on and girlfriend present. Continue with current plan of care.      Pain Pain Assessment Pain Scale: 0-10 Pain Score: 0-No pain  Therapy/Group: Individual Therapy  Lillybeth Tal 05/19/2018, 12:27 PM

## 2018-05-19 NOTE — Progress Notes (Signed)
Occupational Therapy Session Note  Patient Details  Name: [name] MRN: [id number] Date of Birth: 01/24/1941  Today's Date: 05/19/2018 OT Individual Time: 0945-1058 OT Individual Time Calculation (min): 73 min    Short Term Goals: Week 1:  OT Short Term Goal 1 (Week 1): Pt will maintain sustained attention to simple ADL task with min cueing OT Short Term Goal 2 (Week 1): Pt will don shirt with moderate cueing  OT Short Term Goal 3 (Week 1): Pt will appropriately use toothbrush/comb with min verbal cueing   Skilled Therapeutic Interventions/Progress Updates:    Pt received sitting up in w/c with fiancee and granddaughter present. Pt completed functional mobility into bathroom with mod HHA. Pt completed clothing management in standing with min A for balance support. Min A provided for posterior peri hygiene. Pt required moderate cueing this session for direction following- improvement from last session. With simple verbal cueing pt able to don shirt with no physical assist! Pt demonstrating more inappropriate behaviors toward therapist this session, attempting to kiss and touch therapist throughout session, with moderate redirection and edu provided.With manual facilitation to prop B feet up on bed, pt able to tie both shoes with min A. Pt completed 50 ft of functional mobility in hallway using rail with mod HHA. Pt was left supine in bed with all needs met and family present.   Therapy Documentation Precautions:  Precautions Precautions: Fall Precaution Comments: Impulsive Restrictions Weight Bearing Restrictions: No   Pain: Pain Assessment Pain Scale: 0-10 Pain Score: 0-No pain   Therapy/Group: Individual Therapy   H  05/19/2018, 12:11 PM  

## 2018-05-19 NOTE — Care Management (Signed)
[facility] Individual Statement of Services  Patient Name:  [name]  Date:  05/19/2018  Welcome to the [facility].  Our goal is to provide you with an individualized program based on your diagnosis and situation, designed to meet your specific needs.  With this comprehensive rehabilitation program, you will be expected to participate in at least 3 hours of rehabilitation therapies Monday-Friday, with modified therapy programming on the weekends.  Your rehabilitation program will include the following services:  Physical Therapy (PT), Occupational Therapy (OT), Speech Therapy (ST), 24 hour per day rehabilitation nursing, Therapeutic Recreaction (TR), Neuropsychology, Case Management (Social Worker), Rehabilitation Medicine, Nutrition Services and Pharmacy Services  Weekly team conferences will be held on Tuesdays to discuss your progress.  Your Social Worker will talk with you frequently to get your input and to update you on team discussions.  Team conferences with you and your family in attendance may also be held.  Expected length of stay: 21-25 days   overall anticipated outcome: Supervision  Depending on your progress and recovery, your program may change. Your Social Worker will coordinate services and will keep you informed of any changes. Your Social Worker's name and contact numbers are listed  below.  The following services may also be recommended but are not provided by the [facility] will be made to provide these services after discharge if needed.  Arrangements include referral to agencies that provide these services.  Your insurance has been verified to be:  Medicare and CSI Your primary doctor is:  [place]  Pertinent information will be shared with your doctor and your insurance  company.  Social Worker:  [place], [place] or ([phone]   Information discussed with and copy given to patient by: Lennart Pall, 05/19/2018, 2:13 PM

## 2018-05-19 NOTE — Progress Notes (Signed)
Physical Therapy Session Note  Patient Details  Name: [name] MRN: [id number] Date of Birth: 22-Mar-1941  Today's Date: 05/19/2018 PT Individual Time: 0700-0753 PT Individual Time Calculation (min): 53 min   Short Term Goals: Week 1:  PT Short Term Goal 1 (Week 1): pt will perform functional transfers with min A PT Short Term Goal 2 (Week 1): pt will attend to therapeutic task wtih min A x 2 minutes  Skilled Therapeutic Interventions/Progress Updates:    pt awake and alert throughout session.  Decreased restlessness this session.  Mitts donned throughout session for safety.  Pt performs supine to sit with max A.  Stand pivot transfers with mod  A thorughout session.  Pt participated in a "gym" workout since he enjoys the gym at home.  Pt performs UE therex with 3# wts and was able to follow directions with min/mod cuing.  LE therex with 2# wts with mod visual and manual cues.  Pt performs gait 50', 100' with +2 min A, HHA with cues to continue gait as pt has small shuffling steps and requires max cues for attention.  Nustep x 7 minutes with UEs and LEs. Cognitive task with naming family members from pictures, pt unable to state without max/total cuing.  Pt left in bed with alarm set, needs at hand.  Therapy Documentation Precautions:  Precautions Precautions: Fall Precaution Comments: Impulsive Restrictions Weight Bearing Restrictions: No Pain:  no signs/symptoms of pain   Therapy/Group: Individual Therapy  Nickalaus Crooke 05/19/2018, 7:57 AM

## 2018-05-20 ENCOUNTER — Inpatient Hospital Stay (HOSPITAL_COMMUNITY): Payer: Medicare Other

## 2018-05-20 ENCOUNTER — Inpatient Hospital Stay (HOSPITAL_COMMUNITY): Payer: Medicare Other | Admitting: Physical Therapy

## 2018-05-20 ENCOUNTER — Inpatient Hospital Stay (HOSPITAL_COMMUNITY): Payer: Medicare Other | Admitting: Speech Pathology

## 2018-05-20 DIAGNOSIS — B9620 Unspecified Escherichia coli [E. coli] as the cause of diseases classified elsewhere: Secondary | ICD-10-CM

## 2018-05-20 DIAGNOSIS — N390 Urinary tract infection, site not specified: Secondary | ICD-10-CM

## 2018-05-20 DIAGNOSIS — R339 Retention of urine, unspecified: Secondary | ICD-10-CM

## 2018-05-20 NOTE — Progress Notes (Signed)
PHYSICAL MEDICINE & REHABILITATION PROGRESS NOTE   Subjective/Complaints: Had a good night per sitter. Pt lying in bed and appears comfortable   ROS: Limited due to cognitive/behavioral    Objective:   No results found. No results for input(s): WBC, HGB, HCT, PLT in the last 72 hours. No results for input(s): NA, K, CL, CO2, GLUCOSE, BUN, CREATININE, CALCIUM in the last 72 hours.  Intake/Output Summary (Last 24 hours) at 05/20/2018 0902 Last data filed at 05/20/2018 0732 Gross per 24 hour  Intake 720 ml  Output 2100 ml  Net -1380 ml     Physical Exam: Vital Signs Blood pressure 126/79, pulse 78, temperature 97.8 F (36.6 C), temperature source Oral, resp. rate 17, height 6' (1.829 m), weight 68.1 kg, SpO2 97 %. Constitutional: No distress . Vital signs reviewed. HEENT: EOMI, oral membranes moist Neck: supple Cardiovascular: RRR without murmur. No JVD    Respiratory: CTA Bilaterally without wheezes or rales. Normal effort    GI: BS +, non-tender, non-distended, PEG site already closed.  Neurological: He is alert.  Delayed processing. Less distracted.   Motor 4/5 all 4 limbs at least. Reaches continuously at foley Skin: He is not diaphoretic.  Psych: internally distracted    Assessment/Plan: 1. Functional deficits secondary to TBI which require 3+ hours per day of interdisciplinary therapy in a comprehensive inpatient rehab setting.  Physiatrist is providing close team supervision and 24 hour management of active medical problems listed below.  Physiatrist and rehab team continue to assess barriers to discharge/monitor patient progress toward functional and medical goals  Care Tool:  Bathing    Body parts bathed by patient: Chest   Body parts bathed by helper: Buttocks, Front perineal area, Chest, Right arm, Left arm     Bathing assist Assist Level: Maximal Assistance - Patient 24 - 49%     Upper Body Dressing/Undressing Upper body dressing   What is  the patient wearing?: Pull over shirt    Upper body assist Assist Level: Contact Guard/Touching assist    Lower Body Dressing/Undressing Lower body dressing      What is the patient wearing?: Pants     Lower body assist Assist for lower body dressing: Maximal Assistance - Patient 25 - 49%     Toileting Toileting    Toileting assist Assist for toileting: Minimal Assistance - Patient > 75%     Transfers Chair/bed transfer  Transfers assist     Chair/bed transfer assist level: Moderate Assistance - Patient 50 - 74%     Locomotion Ambulation   Ambulation assist      Assist level: Moderate Assistance - Patient 50 - 74% Assistive device: Hand held assist Max distance: 75 ft   Walk 10 feet activity   Assist     Assist level: Moderate Assistance - Patient - 50 - 74% Assistive device: Hand held assist   Walk 50 feet activity   Assist Walk 50 feet with 2 turns activity did not occur: Safety/medical concerns  Assist level: 2 helpers      Walk 150 feet activity   Assist Walk 150 feet activity did not occur: Safety/medical concerns         Walk 10 feet on uneven surface  activity   Assist Walk 10 feet on uneven surfaces activity did not occur: Safety/medical concerns         Wheelchair     Assist Will patient use wheelchair at discharge?: Yes Type of Wheelchair: Manual    Wheelchair  assist level: Dependent - Patient 0%      Wheelchair 50 feet with 2 turns activity    Assist        Assist Level: Dependent - Patient 0%   Wheelchair 150 feet activity     Assist          Medical Problem List and Plan: 1.  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  DVT Prophylaxis/Anticoagulation: Pharmaceutical: Other (comment)--Eliquis 3. Pain Management: tylenol prn.  4. Mood: LCSW to  follow for evaluation and support.  5. Neuropsych: This patient is not capable of making decisions on his own behalf. 6. Skin/Wound Care: Venous pressure relief measures.  Moisturization as needed recurrent seborrhea 7. Fluids/Electrolytes/Nutrition:    -eating well  -PEG out without issue 8. CAD with ICM/CHF/ICD: Monitor for symptoms with activity.  Monitor for signs of overload and check weights daily.  Continue aspirin and metoprolo  - Filed Weights   05/19/18 0551 05/20/18 0401 05/20/18 0718  Weight: 70 kg 70 kg 68.1 kg     -weights stable 9. A fib with RVR: Monitor heart rate twice daily.  Continue metoprolol twice daily. 10.  Urinary retention: Finasteride 5 mg nightly.     -Urine + for 100k E coli: amoxicillin initiated 11/4  -coude/foley---continue for now 11.  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  Bouts of agitation/sundowning: showing improvement  -maximize sleep  -PEG out, foley out soon  -environmental mods  -trazodone 50mg  at 2100  -continue sleep chart 13.  History of Barrett's esophagus with dysphagia: Continue PPI 14.  Seizures?/ At risk for seizures: On Vimpat twice daily 15. OSA: CPAP     LOS: 5 days A FACE TO FACE EVALUATION WAS PERFORMED  Meredith Staggers 05/20/2018, 9:02 AM

## 2018-05-20 NOTE — Progress Notes (Signed)
Recreational Therapy Session Note  Patient Details  Name: CORDARREL STIEFEL MRN: [id number] Date of Birth: 07/11/1941 Today's Date: 05/20/2018  TR eval deferred at this time as pt is not yet appropriate for TR services.  Will continue to monitor through team for future participation.  Liem Copenhaver 05/20/2018, 1:32 PM

## 2018-05-20 NOTE — Progress Notes (Signed)
Speech Language Pathology Daily Session Note  Patient Details  Name: [name] MRN: [id number] Date of Birth: 06/07/1941  Today's Date: 05/20/2018 SLP Individual Time: 0830-0930 SLP Individual Time Calculation (min): 60 min  Short Term Goals: Week 1: SLP Short Term Goal 1 (Week 1): Patient will identify functional items from a field of 2 with 25% accuracy and Max A multimodal cues. SLP Short Term Goal 2 (Week 1): Patient will follow basic commands within functional tasks with 25% accuracy and Max A multimodal cues. SLP Short Term Goal 3 (Week 1): Patient will answer basic yes/no questions with 50% accuracy and Mod A verbal cues.  SLP Short Term Goal 4 (Week 1): Patient will verbalize basic wants/needs at word/phrase level with Max A multimodal cues.  SLP Short Term Goal 5 (Week 1): Patient will consume current diet with minimal overt s/s of aspiration and Max A multimodal cues for use of small bites/sips.  SLP Short Term Goal 6 (Week 1): Patient will demonstrate sustained attention to functional task for 2 minutes with Max A multimodal cues.   Skilled Therapeutic Interventions: Skilled treatment session focused on cognitive goals. SLP facilitated session by providing extra time and Mod A verbal cues for problem solving during a self-feeding when environment controls were reduced (eating from a plate with a regular fork). Patient also participated in a basic visual task in which patient required Max A verbal cues for problem solving and Mod A verbal cues to identify colors from a field of 2. However, patient required total A for naming colors. Patient left upright in wheelchair with fiance present and alarm on. Continue with current plan of care.      Pain No/Denies Pain   Therapy/Group: Individual Therapy  Alfredo Spong, [place] 05/20/2018, 3:25 PM

## 2018-05-20 NOTE — Progress Notes (Signed)
Physical Therapy Session Note  Patient Details  Name: [name] MRN: [id number] Date of Birth: 12-08-1940  Today's Date: 05/20/2018 PT Individual Time: 1030-1124 PT Individual Time Calculation (min): 54 min   Short Term Goals: Week 1:  PT Short Term Goal 1 (Week 1): pt will perform functional transfers with min A PT Short Term Goal 2 (Week 1): pt will attend to therapeutic task wtih min A x 2 minutes  Skilled Therapeutic Interventions/Progress Updates:    pt performs seated UE therex to simulate gym routine with min/mod visual and verbal cues with 5# dowel.  Pt then performs standing squats, heel raises and hip abd at parallel bars with mod cues for attention to task, visual and verbal cues for therex.  Pt performs gait with HHA, mod A 75', 100' with pt with posterior lean, manual facilitation for forward wt shifts, pt with shuffling gait pattern and occasional scissoring.  nustep x 7 minutes level 4 for strength, attention and endurance.  Pt performs corn hole game in standing with mod A for balance, min cuing for game play and attention.  Attempted to have pt identify self in family photos, pt requires max cuing.  Pt left in bed with alarm set, family present  Therapy Documentation Precautions:  Precautions Precautions: Fall Precaution Comments: Impulsive Restrictions Weight Bearing Restrictions: No Pain: Pain Assessment Pain Scale: 0-10 Pain Score: 0-No pain    Therapy/Group: Individual Therapy  Shaundrea Carrigg 05/20/2018, 11:24 AM

## 2018-05-20 NOTE — Progress Notes (Signed)
Occupational Therapy Session Note  Patient Details  Name: [name] MRN: [id number] Date of Birth: October 28, 1940  Today's Date: 05/20/2018 OT Individual Time: 0730-0830 Session 2: 1130-1200 OT Individual Time Calculation (min): 60 min Session 2: 30 min   Short Term Goals: Week 1:  OT Short Term Goal 1 (Week 1): Pt will maintain sustained attention to simple ADL task with min cueing OT Short Term Goal 2 (Week 1): Pt will don shirt with moderate cueing  OT Short Term Goal 3 (Week 1): Pt will appropriately use toothbrush/comb with min verbal cueing   Skilled Therapeutic Interventions/Progress Updates:    Session 1: Session focused on b/d tasks at shower level. Pt completed bed mobility with mod A with cueing for upright posture. Pt completed functional mobility into bathroom with mod HHA. Pt transferred into shower with max cueing to sit on TTB. Pt able to wash UB/LB with cueing only. Pt transferred out of shower with mod HHA. Sitting EOB, pt able to don shirt with no cueing! Cueing for orientation of pants provided, with pt able to don with min A overall. Pt then sat up to eat breakfast. Family requested use of built up and angled utensil, with demo and edu provided re indication for such a utensil. Pt was left sitting up in w/c with pass off to SLP. No indications of pain throughout session.   Session 2: Pt seen for self feeding lunch. Distractions were minimized- all auditory stimuli removed and all visual stimuli from tray removed except for piece of food being eaten. With moderate cueing for initiation and HOH to bring to mouth, pt able to eat sandwich with only cueing for bite size. Pt then ate mashed potatoes, with moderate HOH to scoop food pt able to bring spoon to mouth. Of note- pt unable to orient sandwich and spoon correctly to mouth during meal. Pt able to load spoon and bring to mouth independently 8x total during session. Pt left supine in bed with telesitter present and bed alarm  set. No indications of pain throughout session.   Therapy Documentation Precautions:  Precautions Precautions: Fall Precaution Comments: Impulsive Restrictions Weight Bearing Restrictions: No   Vital Signs: Therapy Vitals Temp: 97.8 F (36.6 C) Temp Source: Oral Pulse Rate: 80 Resp: 17 BP: 128/80 Patient Position (if appropriate): Sitting Oxygen Therapy SpO2: 97 % O2 Device: Room Air Pain: Pain Assessment Pain Scale: 0-10 Pain Score: 0-No pain   Therapy/Group: Individual Therapy  Curtis Sites 05/20/2018, 10:35 AM

## 2018-05-21 ENCOUNTER — Inpatient Hospital Stay (HOSPITAL_COMMUNITY): Payer: Medicare Other | Admitting: Physical Therapy

## 2018-05-21 ENCOUNTER — Inpatient Hospital Stay (HOSPITAL_COMMUNITY): Payer: Medicare Other

## 2018-05-21 ENCOUNTER — Inpatient Hospital Stay (HOSPITAL_COMMUNITY): Payer: Medicare Other | Admitting: Speech Pathology

## 2018-05-21 LAB — BASIC METABOLIC PANEL
Anion gap: 4 — ABNORMAL LOW (ref 5–15)
BUN: 15 mg/dL (ref 8–23)
CHLORIDE: 106 mmol/L (ref 98–111)
CO2: 30 mmol/L (ref 22–32)
CREATININE: 0.61 mg/dL (ref 0.61–1.24)
Calcium: 8.9 mg/dL (ref 8.9–10.3)
GFR calc Af Amer: >60 mL/min (ref >60)
GFR calc non Af Amer: >60 mL/min (ref >60)
GLUCOSE: 99 mg/dL (ref 70–99)
POTASSIUM: 4 mmol/L (ref 3.5–5.1)
Sodium: 140 mmol/L (ref 135–145)

## 2018-05-21 LAB — CBC
HCT: 34.1 % — ABNORMAL LOW (ref 39.0–52.0)
HEMOGLOBIN: 11.1 g/dL — AB (ref 13.0–17.0)
MCH: 32.6 pg (ref 26.0–34.0)
MCHC: 32.6 g/dL (ref 30.0–36.0)
MCV: 100 fL (ref 80.0–100.0)
NRBC: 0 % (ref 0.0–0.2)
Platelets: 220 10*3/uL (ref 150–400)
RBC: 3.41 MIL/uL — AB (ref 4.22–5.81)
RDW: 15.9 % — ABNORMAL HIGH (ref 11.5–15.5)
WBC: 4.2 10*3/uL (ref 4.0–10.5)

## 2018-05-21 LAB — PREALBUMIN: Prealbumin: 19.8 mg/dL (ref 18–38)

## 2018-05-21 MED ORDER — TAMSULOSIN HCL 0.4 MG PO CAPS
0.4000 mg | ORAL_CAPSULE | Freq: Every day | ORAL | Status: DC
  Administered 2018-05-21 – 2018-06-10 (×21): 0.4 mg via ORAL
  Filled 2018-05-21 (×23): qty 1

## 2018-05-21 MED ORDER — QUETIAPINE FUMARATE 25 MG PO TABS
12.5000 mg | ORAL_TABLET | Freq: Two times a day (BID) | ORAL | Status: DC | PRN
  Administered 2018-05-21 – 2018-06-11 (×22): 12.5 mg via ORAL
  Filled 2018-05-21 (×23): qty 1

## 2018-05-21 NOTE — Progress Notes (Signed)
Occupational Therapy Session Note  Patient Details  Name: [name] MRN: [id number] Date of Birth: 05-14-1941  Today's Date: 05/21/2018 OT Individual Time: 1050-1135 OT Individual Time Calculation (min): 45 min  OT Missed Time: 30 min d/t pt fatigue   Short Term Goals: Week 1:  OT Short Term Goal 1 (Week 1): Pt will maintain sustained attention to simple ADL task with min cueing OT Short Term Goal 2 (Week 1): Pt will don shirt with moderate cueing  OT Short Term Goal 3 (Week 1): Pt will appropriately use toothbrush/comb with min verbal cueing   Skilled Therapeutic Interventions/Progress Updates:    Pt sitting up in w/c, restless to get back to bed. Attempted to cue pt to ambulate into bathroom for shower, pt unwilling, climbing back into bed. Pt immediately laid down and closed his eyes. With maximal cueing, pt able to wash UB at bed level with mod A. Pt sat EOB requiring mod A to transition to EOB, and donned shirt with min A. Pt then returned to supine and closed eyes, quickly falling asleep. When pt's daughter returned to room, edu was provided re arousal, cueing and reducing distractions when pt eats lunch. Daughter very appreciative of education. Pt left supine in bed with all needs met.   Therapy Documentation Precautions:  Precautions Precautions: Fall Precaution Comments: Impulsive Restrictions Weight Bearing Restrictions: No Vital Signs: Therapy Vitals Pulse Rate: (!) 39 Resp: 18 BP: (!) 112/59 Patient Position (if appropriate): Sitting Oxygen Therapy SpO2: 98 % O2 Device: Room Air Pain: Pain Assessment Pain Scale: 0-10 Pain Score: 0-No pain   Therapy/Group: Individual Therapy  Curtis Sites 05/21/2018, 11:51 AM

## 2018-05-21 NOTE — Progress Notes (Addendum)
Physical Therapy Session Note  Patient Details  Name: [name] MRN: [id number] Date of Birth: January 29, 1941  Today's Date: 05/21/2018 PT Individual Time: 0845-1000; 1300-1400 PT Individual Time Calculation (min): 75 min and 60 min  Short Term Goals: Week 1:  PT Short Term Goal 1 (Week 1): pt will perform functional transfers with min A PT Short Term Goal 2 (Week 1): pt will attend to therapeutic task wtih min A x 2 minutes  Skilled Therapeutic Interventions/Progress Updates:  Session 1: Pt received seated in w/c in room brushing hair at sink, agreeable to PT. No complaints of pain. Pt's daughter Suanne Marker present during therapy session. Pt perseverates on attempting to pull out catheter during therapy session, does better when distracted or given BUE task to occupy him. Nustep level 3 x 10 min with B UE/LE for global strengthening and endurance. Sit to stand with min A from chair. Ambulation 2 x 50 ft with no AD and min A, decreased BLE clearance and shuffling gait pattern. Per pt's daughter's report pt shuffled at baseline. Pt exhibits improved BLE clearance with gait when not distracted by environment and other patients. Seated balance performing ball toss 2 x 5 min before onset of fatigue. Pt is able to correctly name all colors on beach ball with max cueing to encourage pt to repeat color name, unable to come up with color name on his own except for "yellow". Ascend/descend 8 3" stairs with 2 handrails and min A, max multimodal cueing for safety. Pt does not fully place his foot on the step when ascending stairs and just places his toes on stairs. Pt unable to follow cues to correct and to perform stairs safely. Will continue to progress pt on stairs to improve safety. Pt left seated in w/c in room with needs in reach, daughter present, telesitter present, quick release belt and chair alarm in place.  Session 2: Pt received supine in bed, agreeable to PT. Pt appears more fatigued than during AM  session. No complaints of pain. Bed mobility min A with increased time and cues needed to come to EOB. Pt is able to don shoes with min A with increased time and cues needed. SPT bed to w/c with min A. Pt performs seated BUE dowel therex x 10-15 reps with intermittent use of 1# and 3# dowel. Pt becomes easily distracted during therapy session and needs cues to attend to therapy tasks. SPT w/c to mat table with mod A with increased time and cues needed to perform transfer. Seated 2# ball core therex while seated EOM. Seated forward leans onto blue therapy ball with focus on core engagement and return to midline. Seated ball toss progressing to standing ball toss with min to mod A for standing balance. Pt needs increased time, cues, and assist this PM due to increased distractibility, decreased focus on therapy tasks, and increased fatigue this PM. Pt left supine in bed with needs in reach at end of therapy session, bed alarm in place, telesitter present, granddaughter present.  Therapy Documentation Precautions:  Precautions Precautions: Fall Precaution Comments: Impulsive Restrictions Weight Bearing Restrictions: No Pain: Pain Assessment Pain Scale: 0-10 Pain Score: 0-No pain    Therapy/Group: Individual Therapy  Excell Seltzer, PT, DPT  05/21/2018, 12:10 PM

## 2018-05-21 NOTE — Plan of Care (Signed)
  Problem: RH SAFETY Goal: RH STG ADHERE TO SAFETY PRECAUTIONS W/ASSISTANCE/DEVICE Description STG Adhere to Safety Precautions With min Assistance and appropriate assistive Device.  Outcome: Progressing  Tele-sitter, family-sitter, bed/chair alarm, proper footwear, floor mats

## 2018-05-21 NOTE — Progress Notes (Addendum)
[facility] PHYSICAL MEDICINE & REHABILITATION PROGRESS NOTE   Subjective/Complaints: Slept well. Received an afternoon dose of seroquel for agitation and daughter states it "knocked him out" for the remainder of the day. Asked if we could use something less aggressive. Concerned about low bp's after seroquel too  ROS: limited  Objective:   No results found. Recent Labs    05/21/18 0424  WBC 4.2  HGB 11.1*  HCT 34.1*  PLT 220   Recent Labs    05/21/18 0424  NA 140  K 4.0  CL 106  CO2 30  GLUCOSE 99  BUN 15  CREATININE 0.61  CALCIUM 8.9    Intake/Output Summary (Last 24 hours) at 05/21/2018 0847 Last data filed at 05/21/2018 0506 Gross per 24 hour  Intake 360 ml  Output 1525 ml  Net -1165 ml     Physical Exam: Vital Signs Blood pressure (!) 112/59, pulse (!) 39, temperature 98.3 F (36.8 C), temperature source Oral, resp. rate 18, height 6' (1.829 m), weight 66.5 kg, SpO2 98 %. Constitutional: No distress . Vital signs reviewed. HEENT: EOMI, oral membranes moist Neck: supple Cardiovascular: IRR without murmur. No JVD    Respiratory: CTA Bilaterally without wheezes or rales. Normal effort    GI: BS +, non-tender, non-distended  Neurological: He is alert.  Delayed processing. Less distracted. Good with spontaneous language.   Motor 4/5 all 4 limbs at least. Wearing mits Skin: He is not diaphoretic.  Psych: pleasant, better focus    Assessment/Plan: 1. Functional deficits secondary to TBI which require 3+ hours per day of interdisciplinary therapy in a comprehensive inpatient rehab setting.  Physiatrist is providing close team supervision and 24 hour management of active medical problems listed below.  Physiatrist and rehab team continue to assess barriers to discharge/monitor patient progress toward functional and medical goals  Care Tool:  Bathing    Body parts bathed by patient: Right arm, Left arm, Chest, Abdomen, Face, Front perineal area, Right upper  leg, Left upper leg, Right lower leg, Left lower leg, Buttocks   Body parts bathed by helper: Buttocks, Front perineal area, Chest, Right arm, Left arm     Bathing assist Assist Level: Minimal Assistance - Patient > 75%     Upper Body Dressing/Undressing Upper body dressing   What is the patient wearing?: Pull over shirt    Upper body assist Assist Level: Contact Guard/Touching assist    Lower Body Dressing/Undressing Lower body dressing      What is the patient wearing?: Pants     Lower body assist Assist for lower body dressing: Minimal Assistance - Patient > 75%     Toileting Toileting    Toileting assist Assist for toileting: Minimal Assistance - Patient > 75%     Transfers Chair/bed transfer  Transfers assist     Chair/bed transfer assist level: Moderate Assistance - Patient 50 - 74%     Locomotion Ambulation   Ambulation assist      Assist level: Moderate Assistance - Patient 50 - 74% Assistive device: Hand held assist Max distance: 75 ft   Walk 10 feet activity   Assist     Assist level: Moderate Assistance - Patient - 50 - 74% Assistive device: Hand held assist   Walk 50 feet activity   Assist Walk 50 feet with 2 turns activity did not occur: Safety/medical concerns  Assist level: 2 helpers      Walk 150 feet activity   Assist Walk 150 feet activity did  not occur: Safety/medical concerns         Walk 10 feet on uneven surface  activity   Assist Walk 10 feet on uneven surfaces activity did not occur: Safety/medical concerns         Wheelchair     Assist Will patient use wheelchair at discharge?: Yes Type of Wheelchair: Manual    Wheelchair assist level: Dependent - Patient 0%      Wheelchair 50 feet with 2 turns activity    Assist        Assist Level: Dependent - Patient 0%   Wheelchair 150 feet activity     Assist          Medical Problem List and Plan: 1.  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  DVT Prophylaxis/Anticoagulation: Pharmaceutical: Other (comment)--Eliquis 3. Pain Management: tylenol prn.  4. Mood: LCSW to follow for evaluation and support.  5. Neuropsych: This patient is not capable of making decisions on his own behalf. 6. Skin/Wound Care: Venous pressure relief measures.  Moisturization as needed recurrent seborrhea 7. Fluids/Electrolytes/Nutrition:    -PEG removed, doing well.  -eating 50-100%  -I personally reviewed all of the patient's labs today.  Add prealbumin 8. CAD with ICM/CHF/ICD: Monitor for symptoms with activity.  Monitor for signs of overload and check weights daily.  Continue aspirin and metoprolo  - Filed Weights   05/20/18 0401 05/20/18 0718 05/21/18 0507  Weight: 70 kg 68.1 kg 66.5 kg     -weights stable, decreased 9. A fib with RVR: Monitor heart rate twice daily.  Continue metoprolol twice daily. 10.  Urinary retention: Finasteride 5 mg nightly.     -Urine + for 100k E coli: amoxicillin initiated 11/4  -coude/foley---continue for now---dc potentially tomorrow or Friday   -doing uncomfortable in/out caths may be more distracting cognitively then having foley in  -add flomax tonight--watch bp 11.  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  Bouts of agitation/sundowning: showing improvement  -maximize sleep  -PEG out, foley out soon  -environmental mods  -trazodone 50mg  at 2100  -continue sleep chart 13.  History of Barrett's esophagus with dysphagia: Continue PPI 14.  Seizures?/ At risk for seizures: On Vimpat twice daily 15. OSA: CPAP     LOS: 6 days A FACE TO FACE EVALUATION WAS PERFORMED  Meredith Staggers 05/21/2018, 8:47 AM

## 2018-05-21 NOTE — Progress Notes (Signed)
Speech Language Pathology Daily Session Note  Patient Details  Name: [name] MRN: [id number] Date of Birth: May 22, 1941  Today's Date: 05/21/2018 SLP Individual Time: 6579-0383 SLP Individual Time Calculation (min): 55 min  Short Term Goals: Week 1: SLP Short Term Goal 1 (Week 1): Patient will identify functional items from a field of 2 with 25% accuracy and Max A multimodal cues. SLP Short Term Goal 2 (Week 1): Patient will follow basic commands within functional tasks with 25% accuracy and Max A multimodal cues. SLP Short Term Goal 3 (Week 1): Patient will answer basic yes/no questions with 50% accuracy and Mod A verbal cues.  SLP Short Term Goal 4 (Week 1): Patient will verbalize basic wants/needs at word/phrase level with Max A multimodal cues.  SLP Short Term Goal 5 (Week 1): Patient will consume current diet with minimal overt s/s of aspiration and Max A multimodal cues for use of small bites/sips.  SLP Short Term Goal 6 (Week 1): Patient will demonstrate sustained attention to functional task for 2 minutes with Max A multimodal cues.   Skilled Therapeutic Interventions: Skilled treatment session focused on cognitive goals. SLP facilitated session by providing continued education with the patient's daughter in regards to patient's current cognitive functioning and SLP goals. She verbalized understanding and agreeable to minimizing some environmental controls during functional tasks to allow patient opportunities to problem solve. Patient independently reported he had to use the bathroom and was continent of bowel with extra time and Mod A verbal and tactile cues needed to minimize patient attempting to pull at catheter. Patient also performed basic self-care tasks at the sink with Max A verbal cues needed for problem solving, especially due to perseveration. Patient left upright in wheelchair with RN and daughter present. Continue with current plan of care.      Pain No/Denies Pain    Therapy/Group: Individual Therapy  Demonte Dobratz, [place] 05/21/2018, 8:59 AM

## 2018-05-22 ENCOUNTER — Inpatient Hospital Stay (HOSPITAL_COMMUNITY): Payer: Medicare Other

## 2018-05-22 ENCOUNTER — Inpatient Hospital Stay (HOSPITAL_COMMUNITY): Payer: Medicare Other | Admitting: Speech Pathology

## 2018-05-22 ENCOUNTER — Inpatient Hospital Stay (HOSPITAL_COMMUNITY): Payer: Medicare Other | Admitting: Physical Therapy

## 2018-05-22 NOTE — Progress Notes (Signed)
Occupational Therapy Session Note  Patient Details  Name: [name] MRN: [id number] Date of Birth: 02/25/1941  Today's Date: 05/22/2018 OT Individual Time: 0930-1045 OT Individual Time Calculation (min): 75 min    Short Term Goals: Week 1:  OT Short Term Goal 1 (Week 1): Pt will maintain sustained attention to simple ADL task with min cueing OT Short Term Goal 2 (Week 1): Pt will don shirt with moderate cueing  OT Short Term Goal 3 (Week 1): Pt will appropriately use toothbrush/comb with min verbal cueing   Skilled Therapeutic Interventions/Progress Updates:    Pt received sitting up in w/c with fiance present. Pt initially restless and easily agitated. All visual and auditory stimuli in room decreased and pt calmed down and was able to participate functionally. Pt sat in front of sink and with short, precise cueing was able to wash UB/LB with no physical assist. He donned shirt with cueing only and min A for donning pants. Pt required frequent redirection to task throughout session. Pt's fiance was again educated on the impact of stimuli on pt's behavior and agitation. Pt was brought down to IADL apt and he completed simulated ironing and folding task at seated/standing level, requiring mod A for standing balance support. Pt was returned to room and brought to toilet with no void despite gas. Pt was left sitting up in w/c with all needs met, fiance present, telesitter present and chair alarm belt fastened. No indications of pain throughout session.   Therapy Documentation Precautions:  Precautions Precautions: Fall Precaution Comments: Impulsive Restrictions Weight Bearing Restrictions: No Pain: Pain Assessment Pain Scale: 0-10 Pain Score: 0-No pain   Therapy/Group: Individual Therapy   H  05/22/2018, 3:52 PM 

## 2018-05-22 NOTE — Progress Notes (Signed)
Speech Language Pathology Daily Session Note  Patient Details  Name: [name] MRN: [id number] Date of Birth: Jun 08, 1941  Today's Date: 05/22/2018 SLP Individual Time: 1100-1200 SLP Individual Time Calculation (min): 60 min  Short Term Goals: Week 1: SLP Short Term Goal 1 (Week 1): Patient will identify functional items from a field of 2 with 25% accuracy and Max A multimodal cues. SLP Short Term Goal 2 (Week 1): Patient will follow basic commands within functional tasks with 25% accuracy and Max A multimodal cues. SLP Short Term Goal 3 (Week 1): Patient will answer basic yes/no questions with 50% accuracy and Mod A verbal cues.  SLP Short Term Goal 4 (Week 1): Patient will verbalize basic wants/needs at word/phrase level with Max A multimodal cues.  SLP Short Term Goal 5 (Week 1): Patient will consume current diet with minimal overt s/s of aspiration and Max A multimodal cues for use of small bites/sips.  SLP Short Term Goal 6 (Week 1): Patient will demonstrate sustained attention to functional task for 2 minutes with Max A multimodal cues.   Skilled Therapeutic Interventions: Skilled treatment session focused on cognitive goals. SLP facilitated session by providing Max A verbal cues for initiation, attention, and problem solving during a basic calendar making task. Patient restless throughout task and ambulated to commode with Max A verbal cues but was unable to void despite extra time.  Patient consumed Dys. 3 textures with thin liquids without overt s/s of aspiration and required Mod A verbal cues for initiation for self-feeding and for problem solving during tray set-up. Patient left upright in wheelchiar with alarm on and all needs within reach. Continue with current plan of care.      Pain Pain Assessment Pain Scale: 0-10 Pain Score: 0-No pain  Therapy/Group: Individual Therapy  [name] 05/22/2018, 1:31 PM

## 2018-05-22 NOTE — Plan of Care (Signed)
  Problem: RH SAFETY Goal: RH STG ADHERE TO SAFETY PRECAUTIONS W/ASSISTANCE/DEVICE Description STG Adhere to Safety Precautions With min Assistance and appropriate assistive Device.  Outcome: Progressing  Call light, bed/chair alarm, tele sitter, and family

## 2018-05-22 NOTE — Patient Care Conference (Signed)
Inpatient RehabilitationTeam Conference and Plan of Care Update Date: 05/20/2018   Time: 2:45 PM    Patient Name: [name]      Medical Record Number: [id number]  Date of Birth: 1940/08/05 Sex: Male         Room/Bed: 4W07C/4W07C-01 Payor Info: Payor: MEDICARE / Plan: MEDICARE PART A AND B / Product Type: *No Product type* /    Admitting Diagnosis: TBI  Admit Date/Time:  05/15/2018  1:04 PM Admission Comments: No comment available   Primary Diagnosis:  <principal problem not specified> Principal Problem: <principal problem not specified>  Patient Active Problem List   Diagnosis Date Noted  . TBI (traumatic brain injury) ([place]) 05/15/2018  . ICD (implantable cardioverter-defibrillator) in place 05/21/2016  . COPD with emphysema ([place]) 04/10/2016  . Claudication of right lower extremity ([place]) 10/04/2015  . Preop cardiovascular exam 11/16/2014  . Palpitations 08/13/2013  . Chronic atrial fibrillation ([place]) 03/26/2013  . Hyperlipidemia 03/26/2013  . Barrett's esophagus 03/26/2013  . Obstructive sleep apnea 03/26/2013  . Cardiomyopathy, ischemic 12/19/2012  . HTN (hypertension) 12/19/2012  . Hx pulmonary embolism 12/19/2012  . NSVT (nonsustained ventricular tachycardia) ([place]) 10/23/2012  . CAD S/P multiple PCIs 10/23/2012  . PSVT (paroxysmal supraventricular tachycardia) ([place]) 09/30/2012  . Long term (current) use of anticoagulants 09/30/2012    Expected Discharge Date: Expected Discharge Date: 06/06/18  Team Members Present: Physician leading conference: Dr. Alger Simons Social Worker Present: Lennart Pall, LCSW Nurse Present: Rozetta Nunnery, RN PT Present: Roderic Ovens, PT OT Present: Other (comment)(Sandra Rosana Hoes, OT) SLP Present: Weston Anna, SLP PPS Coordinator present : Daiva Nakayama, RN, CRRN     Current Status/Progress Goal Weekly Team Focus  Medical   bilateral fronto-temporal [facility] with prolonged hospital and rehab course complicated by fall. significant  confusion and restlessness. PEG/foley cath  improve day night awareness, improve attention  attention and concentration, day night cycle   Bowel/Bladder   Pt has coude catheter in place. Pt is incontinent of bowel. LBM 05/18/2018  Pt will assist with rolling in bed during bowel movement brief change   Assist with toileting needs PRN    Swallow/Nutrition/ Hydration   Dys. 3 textures with thin liquids, Max-Total A  Min A  decrease impulsivity    ADL's   Improvements in following commands and in dressing- (S) UB, mod A LB  24/7 (S) ADLs and transfers  Functional mobility, familiar ADL routine, following commands   Mobility   mod A transfers and gait with HHA  supervision overall  attention, awareness, balance, gait   Communication   Max-Total A  Min-Mod A  verbal expression of basic wants/needs    Safety/Cognition/ Behavioral Observations  [place] level IV-emerging V, Max-Total A  Mod A  initiation, attention   Pain   Pt has no visible complaints of pain on Faces scale  Pain <2  Assess pain Q shift and PRN   Skin   Pt has MASD to the coccyx and PEG removal site to left lower abdomen  Maintain skin integrity and prevent skin breakdown. Promote healing to MASD area and PEG removal site  Assess skin Q shift and PRN     Rehab Goals Patient on target to meet rehab goals: Yes *See Care Plan and progress notes for long and short-term goals.     Barriers to Discharge  Current Status/Progress Possible Resolutions Date Resolved   Physician    Medical stability  substantial cognitive deficits     24 hour supervision  Nursing                  PT                    OT                  SLP                SW                Discharge Planning/Teaching Needs:  Plan to d/c home with fiance, family members and private duty caregivers providing 24/7 assistance.  Teaching is ongoing with family here daily.   Team Discussion:  TBI +atrophy;  Significant confusion.  UTI - treating;  Plan  to keep in foley for now.  Overall showing cognitive improvement.  Still needs some med management for agitation.  Doing well with ADL routine and overall supervision with this.  Mod assist ambulation with HHA but poor balance.  Aphasic - ST with mod assist goals.  Revisions to Treatment Plan:  NA    Continued Need for Acute Rehabilitation Level of Care: The patient requires daily medical management by a physician with specialized training in physical medicine and rehabilitation for the following conditions: Daily direction of a multidisciplinary physical rehabilitation program to ensure safe treatment while eliciting the highest outcome that is of practical value to the patient.: Yes Daily medical management of patient stability for increased activity during participation in an intensive rehabilitation regime.: Yes Daily analysis of laboratory values and/or radiology reports with any subsequent need for medication adjustment of medical intervention for : Neurological problems;Urological problems;Nutritional problems   I attest that I was present, lead the team conference, and concur with the assessment and plan of the team.   Tyrone Balash 05/22/2018, 10:44 AM

## 2018-05-22 NOTE — Progress Notes (Signed)
Physical Therapy Session Note  Patient Details  Name: ANIVAL PASHA MRN: [id number] Date of Birth: 1941/07/02  Today's Date: 05/22/2018 PT Individual Time: 2111-7356 PT Individual Time Calculation (min): 53 min   Short Term Goals: Week 1:  PT Short Term Goal 1 (Week 1): pt will perform functional transfers with min A PT Short Term Goal 2 (Week 1): pt will attend to therapeutic task wtih min A x 2 minutes  Skilled Therapeutic Interventions/Progress Updates:   pt performs gait throughout unit with min/mod A due to posterior lean, shuffling gait and manual facilitation for wt shifts, anterior hip translation, balance and motor planning with turns to sit.  Pt performs seated ball toss with attempt to count each catch or throw.  Pt unable to count out loud despite multimodal cues.  Supervision for sitting balance.  Standing balance with mod/max A due to posterior lean with ball toss for strength and endurance training.  Corn hole game for balance and attention with pt requiring max multimodal cues to begin, then able to perform game with mod A for standing balance.  nustep x 10 minutes level 4 for UE/LE strengthening. Pt requiring max/total cuing due to perseveration on pulling on catheter, mitts applied to reduce risk of pulling catheter out. Pt left in w/c with safety belt and mitts donned, family present, needs at hand.   Therapy Documentation Precautions:  Precautions Precautions: Fall Precaution Comments: Impulsive Restrictions Weight Bearing Restrictions: No Pain: Pain Assessment Pain Scale: 0-10 Pain Score: 0-No pain    Therapy/Group: Individual Therapy  DONAWERTH,KAREN 05/22/2018, 1:52 PM

## 2018-05-22 NOTE — Progress Notes (Signed)
[facility] PHYSICAL MEDICINE & REHABILITATION PROGRESS NOTE   Subjective/Complaints: Eating breakfast with OT. No new problems reported.   ROS: Limited due to cognitive/behavioral   Objective:   No results found. Recent Labs    05/21/18 0424  WBC 4.2  HGB 11.1*  HCT 34.1*  PLT 220   Recent Labs    05/21/18 0424  NA 140  K 4.0  CL 106  CO2 30  GLUCOSE 99  BUN 15  CREATININE 0.61  CALCIUM 8.9    Intake/Output Summary (Last 24 hours) at 05/22/2018 1107 Last data filed at 05/22/2018 0800 Gross per 24 hour  Intake 360 ml  Output 1500 ml  Net -1140 ml     Physical Exam: Vital Signs Blood pressure (!) 101/50, pulse 87, temperature 98.1 F (36.7 C), resp. rate 18, height 6' (1.829 m), weight 66.2 kg, SpO2 96 %. Constitutional: No distress . Vital signs reviewed. HEENT: EOMI, oral membranes moist Neck: supple Cardiovascular: RRR without murmur. No JVD    Respiratory: CTA Bilaterally without wheezes or rales. Normal effort    GI: BS +, non-tender, non-distended    Neurological: He is alert.  Delayed processing. Less distracted. Some spontaneous appropriate language, some garble.  Motor 4/5 all 4 limbs at least. Wearing mits Skin: He is not diaphoretic.  Psych: pleasant, less distracted    Assessment/Plan: 1. Functional deficits secondary to TBI which require 3+ hours per day of interdisciplinary therapy in a comprehensive inpatient rehab setting.  Physiatrist is providing close team supervision and 24 hour management of active medical problems listed below.  Physiatrist and rehab team continue to assess barriers to discharge/monitor patient progress toward functional and medical goals  Care Tool:  Bathing    Body parts bathed by patient: Chest, Abdomen   Body parts bathed by helper: Buttocks, Front perineal area, Chest, Right arm, Left arm     Bathing assist Assist Level: Moderate Assistance - Patient 50 - 74%     Upper Body Dressing/Undressing Upper  body dressing   What is the patient wearing?: Pull over shirt    Upper body assist Assist Level: Minimal Assistance - Patient > 75%    Lower Body Dressing/Undressing Lower body dressing      What is the patient wearing?: Pants     Lower body assist Assist for lower body dressing: Minimal Assistance - Patient > 75%     Toileting Toileting    Toileting assist Assist for toileting: Minimal Assistance - Patient > 75%     Transfers Chair/bed transfer  Transfers assist     Chair/bed transfer assist level: Moderate Assistance - Patient 50 - 74%     Locomotion Ambulation   Ambulation assist      Assist level: Minimal Assistance - Patient > 75% Assistive device: Hand held assist Max distance: 50'   Walk 10 feet activity   Assist     Assist level: Minimal Assistance - Patient > 75% Assistive device: Hand held assist   Walk 50 feet activity   Assist Walk 50 feet with 2 turns activity did not occur: Safety/medical concerns  Assist level: Minimal Assistance - Patient > 75% Assistive device: Hand held assist    Walk 150 feet activity   Assist Walk 150 feet activity did not occur: Safety/medical concerns         Walk 10 feet on uneven surface  activity   Assist Walk 10 feet on uneven surfaces activity did not occur: Safety/medical concerns  Wheelchair     Assist Will patient use wheelchair at discharge?: Yes Type of Wheelchair: Manual    Wheelchair assist level: Dependent - Patient 0%      Wheelchair 50 feet with 2 turns activity    Assist        Assist Level: Dependent - Patient 0%   Wheelchair 150 feet activity     Assist          Medical Problem List and Plan: 1.  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  DVT  Prophylaxis/Anticoagulation: Pharmaceutical: Other (comment)--Eliquis 3. Pain Management: tylenol prn.  4. Mood: LCSW to follow for evaluation and support.  5. Neuropsych: This patient is not capable of making decisions on his own behalf. 6. Skin/Wound Care: Venous pressure relief measures.  Moisturization as needed recurrent seborrhea 7. Fluids/Electrolytes/Nutrition:  .  -eating 50-100%  -prealbumin low yesterday  -continue to push po, track prealbumin  -recheck labs next week 8. CAD with ICM/CHF/ICD: Monitor for symptoms with activity.  Monitor for signs of overload and check weights daily.  Continue aspirin and metoprolo  - Filed Weights   05/20/18 0718 05/21/18 0507 05/22/18 0510  Weight: 68.1 kg 66.5 kg 66.2 kg     -weights appear stable.  9. A fib with RVR: Monitor heart rate twice daily.  Continue metoprolol twice daily. 10.  Urinary retention: Finasteride 5 mg nightly.     -Urine + for 100k E coli: amoxicillin initiated 11/4  -coude/foley---continue for now---dc potentially  Friday   -doing uncomfortable in/out caths may be more distracting cognitively then having foley in  -added flomax hs, bp holding today 11.  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  Bouts of agitation/sundowning: showing some improvement  -maximize sleep   foley out soon  -environmental mods  -trazodone 50mg  at 2100  -continue sleep chart 13.  History of Barrett's esophagus with dysphagia: Continue PPI 14.  Seizures?/ At risk for seizures: On Vimpat twice daily 15. OSA: CPAP     LOS: 7 days A FACE TO FACE EVALUATION WAS PERFORMED  Meredith Staggers 05/22/2018, 11:07 AM

## 2018-05-22 NOTE — Progress Notes (Signed)
Occupational Therapy Session Note  Patient Details  Name: [name] MRN: [id number] Date of Birth: 1941-04-06  Today's Date: 05/22/2018 OT Individual Time: 8675-4492 OT Individual Time Calculation (min): 73 min    Short Term Goals: Week 1:  OT Short Term Goal 1 (Week 1): Pt will maintain sustained attention to simple ADL task with min cueing OT Short Term Goal 2 (Week 1): Pt will don shirt with moderate cueing  OT Short Term Goal 3 (Week 1): Pt will appropriately use toothbrush/comb with min verbal cueing   Skilled Therapeutic Interventions/Progress Updates:    Pt resting in bed with sitter present.  Pt acknowledge OT and agreed to getting OOB.  Pt amb with RW to bathroom (min A with mod verbal cues for sequencing and safety).  Pt sat on tub bench but was persistent in declining removal of shirt and pants to take shower.  Pt became physically resistant and redirected to amb with RW back to room to eat breakfast. Pt initially perseverated on catheter tube at bottom of pants but easily redirected.  Focus on self feeding, portion management, and attention to task.  Pt attempted to pour liquids on food items but easily redirected.  Pt required 30 mins to complete breakfast with mod verbal cues to redirect to task. Pt's fiancee entered room and was successful with encouraging fluid intake.  Pt remained seated in chair with fiancee present.   Therapy Documentation Precautions:  Precautions Precautions: Fall Precaution Comments: Impulsive Restrictions Weight Bearing Restrictions: No Pain:  Pt with no s/s of pain   Therapy/Group: Individual Therapy  Ausar, Georgiou 05/22/2018, 8:14 AM

## 2018-05-23 ENCOUNTER — Inpatient Hospital Stay (HOSPITAL_COMMUNITY): Payer: Medicare Other

## 2018-05-23 ENCOUNTER — Inpatient Hospital Stay (HOSPITAL_COMMUNITY): Payer: Medicare Other | Admitting: Speech Pathology

## 2018-05-23 ENCOUNTER — Inpatient Hospital Stay (HOSPITAL_COMMUNITY): Payer: Medicare Other | Admitting: Physical Therapy

## 2018-05-23 ENCOUNTER — Inpatient Hospital Stay (HOSPITAL_COMMUNITY): Payer: Medicare Other | Admitting: Occupational Therapy

## 2018-05-23 NOTE — Progress Notes (Signed)
[place] PHYSICAL MEDICINE & REHABILITATION PROGRESS NOTE   Subjective/Complaints: Up brushing teeth with OT. No new issues. Slept well  ROS: Limited due to cognitive/behavioral   Objective:   No results found. Recent Labs    05/21/18 0424  WBC 4.2  HGB 11.1*  HCT 34.1*  PLT 220   Recent Labs    05/21/18 0424  NA 140  K 4.0  CL 106  CO2 30  GLUCOSE 99  BUN 15  CREATININE 0.61  CALCIUM 8.9    Intake/Output Summary (Last 24 hours) at 05/23/2018 0905 Last data filed at 05/23/2018 9833 Gross per 24 hour  Intake 200 ml  Output 1750 ml  Net -1550 ml     Physical Exam: Vital Signs Blood pressure 111/86, pulse 91, temperature 99.1 F (37.3 C), temperature source Oral, resp. rate 18, height 6' (1.829 m), weight 67.3 kg, SpO2 (!) 77 %. Constitutional: No distress . Vital signs reviewed. HEENT: EOMI, oral membranes moist Neck: supple Cardiovascular: RRR without murmur. No JVD    Respiratory: CTA Bilaterally without wheezes or rales. Normal effort    GI: BS +, non-tender, non-distended  Uro: foley, clear urine Neurological: He is alert.  Delayed processing. Less distracted. Some spontaneous appropriate language but usually perseverative garble.  Motor 4/5 all 4 limbs at least. Wearing mits Skin: He is not diaphoretic.  Psych: pleasant, less distracted    Assessment/Plan: 1. Functional deficits secondary to TBI which require 3+ hours per day of interdisciplinary therapy in a comprehensive inpatient rehab setting.  Physiatrist is providing close team supervision and 24 hour management of active medical problems listed below.  Physiatrist and rehab team continue to assess barriers to discharge/monitor patient progress toward functional and medical goals  Care Tool:  Bathing    Body parts bathed by patient: Chest, Abdomen, Right arm, Left arm, Front perineal area, Buttocks, Right upper leg, Left upper leg, Face   Body parts bathed by helper: Buttocks, Front  perineal area, Chest, Right arm, Left arm     Bathing assist Assist Level: Contact Guard/Touching assist     Upper Body Dressing/Undressing Upper body dressing   What is the patient wearing?: Pull over shirt    Upper body assist Assist Level: Contact Guard/Touching assist    Lower Body Dressing/Undressing Lower body dressing      What is the patient wearing?: Pants     Lower body assist Assist for lower body dressing: Minimal Assistance - Patient > 75%     Toileting Toileting    Toileting assist Assist for toileting: Minimal Assistance - Patient > 75%     Transfers Chair/bed transfer  Transfers assist     Chair/bed transfer assist level: Moderate Assistance - Patient 50 - 74%     Locomotion Ambulation   Ambulation assist      Assist level: Moderate Assistance - Patient 50 - 74% Assistive device: Hand held assist Max distance: 150   Walk 10 feet activity   Assist     Assist level: Moderate Assistance - Patient - 50 - 74% Assistive device: Hand held assist   Walk 50 feet activity   Assist Walk 50 feet with 2 turns activity did not occur: Safety/medical concerns  Assist level: Moderate Assistance - Patient - 50 - 74% Assistive device: Hand held assist    Walk 150 feet activity   Assist Walk 150 feet activity did not occur: Safety/medical concerns  Assist level: Moderate Assistance - Patient - 50 - 74%  Walk 10 feet on uneven surface  activity   Assist Walk 10 feet on uneven surfaces activity did not occur: Safety/medical concerns         Wheelchair     Assist Will patient use wheelchair at discharge?: Yes Type of Wheelchair: Manual    Wheelchair assist level: Dependent - Patient 0%      Wheelchair 50 feet with 2 turns activity    Assist        Assist Level: Dependent - Patient 0%   Wheelchair 150 feet activity     Assist          Medical Problem List and Plan: 1.  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  DVT Prophylaxis/Anticoagulation: Pharmaceutical: Other (comment)--Eliquis 3. Pain Management: tylenol prn.  4. Mood: LCSW to follow for evaluation and support.  5. Neuropsych: This patient is not capable of making decisions on his own behalf. 6. Skin/Wound Care: Venous pressure relief measures.  Moisturization as needed recurrent seborrhea 7. Fluids/Electrolytes/Nutrition:  .  -eating 50-100%  -prealbumin low, recheck labs next week  -encourage po, supervision for meals 8. CAD with ICM/CHF/ICD: Monitor for symptoms with activity.  Monitor for signs of overload and check weights daily.  Continue aspirin and metoprolo  - Filed Weights   05/21/18 0507 05/22/18 0510 05/23/18 0500  Weight: 66.5 kg 66.2 kg 67.3 kg     -weights stable/slightly increased  9. A fib with RVR: Monitor heart rate twice daily.  Continue metoprolol twice daily. 10.  Urinary retention: Finasteride 5 mg nightly.     -Urine + for 100k E coli: amoxicillin initiated 11/4---continue for 7 days  -continue flomax and proscar  -remove foley today and begin voiding trial with coude caths if needed 11.  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  Bouts of agitation/sundowning: showing some improvement  -maximize sleep   foley out today  -environmental mods  -trazodone 50mg  at 2100  -continue sleep chart. He is sleeping better.  13.  History of Barrett's esophagus with dysphagia: Continue PPI 14.  Seizures?/ At risk for seizures: On Vimpat twice daily 15. OSA: CPAP     LOS: 8 days A FACE TO FACE EVALUATION WAS PERFORMED  Meredith Staggers 05/23/2018, 9:05 AM

## 2018-05-23 NOTE — Progress Notes (Signed)
Social Work Patient ID: [name], male   DOB: 1941/06/24, 77 y.o.   MRN: [id number]   Able to meet with pt's daughter and grandaughter this afternoon to review team conference.  Both aware of targeted d/c date of 11/22 at supervision level overall.  Granddaughter asks, "Of this month?"  Daughter asks if a longer LOS might be considered so I addressed the processes that usually occur if team wants to extend but stressed to her that this is our target.  Still with plan to d/c to his home and fiance, family and private duty providing 24/7 care.  Holden Draughon, LCSW

## 2018-05-23 NOTE — Plan of Care (Signed)
  Problem: RH BLADDER ELIMINATION Goal: RH STG MANAGE BLADDER WITH ASSISTANCE Description STG Manage Bladder With min Assistance  Outcome: Progressing  Removal cathter, bladder training  Problem: RH SAFETY Goal: RH STG ADHERE TO SAFETY PRECAUTIONS W/ASSISTANCE/DEVICE Description STG Adhere to Safety Precautions With min Assistance and appropriate assistive Device.  Outcome: Progressing  Call light within reach, bed/chair alarm, tele sitter,.

## 2018-05-23 NOTE — Progress Notes (Signed)
Occupational Therapy Weekly Progress Note  Patient Details  Name: [name] MRN: [id number] Date of Birth: 08-Oct-1940  Beginning of progress report period: May 16, 2018 End of progress report period: May 23, 2018  Today's Date: 05/23/2018 OT Individual Time: 3825-0539 OT Individual Time Calculation (min): 73 min    Patient has met 2 of 3 short term goals.  Pt has made steady progress this week in OT demoing improved command following, attention, and sequencing improving functional performance of BADLs. Pt able to complete UB dressing with supervision/no VC, bathing inconsistently with VC only, and transfers with varying min-mod A d/t posterior bias.   Patient continues to demonstrate the following deficits: muscle weakness, decreased cardiorespiratoy endurance, unbalanced muscle activation, motor apraxia, ataxia, decreased coordination and decreased motor planning, decreased midline orientation, decreased attention to left and ideational apraxia, decreased initiation, decreased attention, decreased awareness, decreased problem solving, decreased safety awareness, decreased memory and delayed processing and decreased sitting balance, decreased standing balance, decreased postural control and decreased balance strategies and therefore will continue to benefit from skilled OT intervention to enhance overall performance with BADL.  Patient progressing toward long term goals..  Continue plan of care.  OT Short Term Goals Week 1:  OT Short Term Goal 1 (Week 1): Pt will maintain sustained attention to simple ADL task with min cueing OT Short Term Goal 1 - Progress (Week 1): Met OT Short Term Goal 2 (Week 1): Pt will don shirt with moderate cueing  OT Short Term Goal 2 - Progress (Week 1): Met OT Short Term Goal 3 (Week 1): Pt will appropriately use toothbrush/comb with min verbal cueing  OT Short Term Goal 3 - Progress (Week 1): Progressing toward goal Week 2:  OT Short Term Goal 1  (Week 2): t will appropriately use toothbrush/comn wiht min VC OT Short Term Goal 2 (Week 2): Pt will sequence bathing body parts with min VC OT Short Term Goal 3 (Week 2): Pt will initate doffing clothing prior to bathing wiht min VC OT Short Term Goal 4 (Week 2): Pt will transfer to toilet consistently wiht min A and LRAD  Skilled Therapeutic Interventions/Progress Updates:        Pt seen in AM for BADL routine. Pt able to follow commands for ben mobility and ambualtion into bathroom with TV on for selective attention and MIN A for balance with RW. Pt perseverates in shower on turning/adjusting water with MAX VC to redirect to washing. Pt only washe B underwarms/peri area despite max VC for sequenicng. Pt requires mod-max A for balance ambualting out of bathroom to sit EOB to dressing. Pt demo significant posterior bias in seated and standing during dressing with min VC for donning pants and set up for shirt. Pt able to don socks and shoes with MOD VC/demostration of taking non skid socks off to switch to regular. Pt continues to demo apraxia/oral fixation attempting to put all grooming/sink items in mouth. Pt needs max VC for grooming sequencing in standing. Pt self feeds with mod A/VC to take smaller bites/sips with all unnecessary items removed from tray. Exited session with pt seated in w/c, belt alarm on and significant other present to supervise.   Therapy Documentation Precautions:  Precautions Precautions: Fall Precaution Comments: Impulsive Restrictions Weight Bearing Restrictions: No  Therapy/Group: Individual Therapy  Tonny Branch 05/23/2018, 12:07 PM

## 2018-05-23 NOTE — Progress Notes (Signed)
Speech Language Pathology Weekly Progress and Session Note  Patient Details  Name: [name] MRN: [id number] Date of Birth: [birth year]  Beginning of progress report period: May 15, 2018 End of progress report period: May 23, 2018  Today's Date: 05/23/2018 SLP Individual Time: 4076-8088 SLP Individual Time Calculation (min): 55 min  Short Term Goals: Week 1: SLP Short Term Goal 1 (Week 1): Patient will identify functional items from a field of 2 with 25% accuracy and Max A multimodal cues. SLP Short Term Goal 1 - Progress (Week 1): Not met SLP Short Term Goal 2 (Week 1): Patient will follow basic commands within functional tasks with 25% accuracy and Max A multimodal cues. SLP Short Term Goal 2 - Progress (Week 1): Met SLP Short Term Goal 3 (Week 1): Patient will answer basic yes/no questions with 50% accuracy and Mod A verbal cues.  SLP Short Term Goal 3 - Progress (Week 1): Not met SLP Short Term Goal 4 (Week 1): Patient will verbalize basic wants/needs at word/phrase level with Max A multimodal cues.  SLP Short Term Goal 4 - Progress (Week 1): Met SLP Short Term Goal 5 (Week 1): Patient will consume current diet with minimal overt s/s of aspiration and Max A multimodal cues for use of small bites/sips.  SLP Short Term Goal 5 - Progress (Week 1): Met SLP Short Term Goal 6 (Week 1): Patient will demonstrate sustained attention to functional task for 2 minutes with Max A multimodal cues.  SLP Short Term Goal 6 - Progress (Week 1): Met    New Short Term Goals: Week 2: SLP Short Term Goal 1 (Week 2): Patient will identify functional items from a field of 2 with 25% accuracy and Max A multimodal cues. SLP Short Term Goal 2 (Week 2): Patient will follow basic commands within functional tasks with 75% accuracy and Min A multimodal cues. SLP Short Term Goal 3 (Week 2): Patient will answer basic yes/no questions with 50% accuracy and Mod A verbal cues.  SLP Short Term Goal 4 (Week  2): Patient will verbalize basic wants/needs at word/phrase level with Mod A multimodal cues.  SLP Short Term Goal 5 (Week 2): Patient will demonstrate sustained attention to functional task for 5 minutes with Max A multimodal cues.  SLP Short Term Goal 6 (Week 2): Patient will demonstrate functional problem solving for basic and familiar tasks with Max A verbal cues.   Weekly Progress Updates: Patient has made functional gains and has met 4 of 6 STGs this reporting period. Currently, patient is consuming Dys. 3 textures with thin liquids with minimal overt s/s of aspiration and overall Max A for use of swallowing strategies due to impulsivity. Patient continues to demonstrate severe impairments in communication but demonstrates improved ability to verbalize basic wants/needs spontaneously and increased use of automatic phrases/soical greetings. Patient also demonstrates overall decreased restlessness and improved sustained attention and demonstrating more behaviors consistent with a Rancho Level V. Patient and family education ongoing. Patient would benefit from continued skilled SLP intervention to maximize his cognitive-linguistic and swallowing function prior to discharge.      Intensity: Minumum of 1-2 x/day, 30 to 90 minutes Frequency: 3 to 5 out of 7 days Duration/Length of Stay: 06/06/18 Treatment/Interventions: Cognitive remediation/compensation;Environmental controls;Internal/external aids;Speech/Language facilitation;Therapeutic Activities;Patient/family education;Functional tasks;Dysphagia/aspiration precaution training;Cueing hierarchy;Multimodal communication approach   Daily Session  Skilled Therapeutic Interventions: Skilled treatment session focused on cognitive-linguistic goals. SLP facilitated session by providing Total A verbal cues for patient to answer basic yes/no questions  accurately in regards to biographical information and immediate environment as patient answered "yes" to  all questions. Patient also required total A to identify functional items from a field of 2. Patient able to read at the word level X 1 and then became perseverative on the word "rattle" and an unintelligible phrase. Patient able to sort coins from a field of 4 with Mod I but required total A to identify coins and coin amounts. Patient completed a basic calendar making task with Mod I for attention and overall Mod A for problem solving. Patient demonstrated overall increased attention and decreased restlessness this session. [name] fiance educated on progress and goals and verbalized understanding. Patient left upright in wheelchair with alarm on and fiance present. Continue with current plan of care.    Pain No/Denies Pain   Therapy/Group: Individual Therapy  Damacio Weisgerber 05/23/2018, 9:38 AM

## 2018-05-23 NOTE — Progress Notes (Signed)
Occupational Therapy Session Note  Patient Details  Name: [name] MRN: [id number] Date of Birth: Jun 20, 1941  Today's Date: 05/23/2018 OT Individual Time: 1402-1500 OT Individual Time Calculation (min): 58 min    Short Term Goals: Week 2:  OT Short Term Goal 1 (Week 2): t will appropriately use toothbrush/comn wiht min VC OT Short Term Goal 2 (Week 2): Pt will sequence bathing body parts with min VC OT Short Term Goal 3 (Week 2): Pt will initate doffing clothing prior to bathing wiht min VC OT Short Term Goal 4 (Week 2): Pt will transfer to toilet consistently wiht min A and LRAD  Skilled Therapeutic Interventions/Progress Updates:    Patient in bed upon arrival with wife present.  Wife notes that his foley was recently removed. Patient calm, aphasia limits communication.   Bed mobility:   Supine to SSP with min A and tactile cues Functional transfers:   SPT to/from bed, w/c, toilet and sit to stand in kitchen environment - patient requires increased time, limited verbal cues, tactile cues / HOH with light touch to guide him in the direction desired - level of assist varies from CG to mod A with return to sit in w/c more difficult as he tends to need assistance to avoid arm rest. ADL:  Don shoes with mod cues, able to tie his shoes with foot propped, toileting - clothing management mod A, hygiene dependent, grooming - wash hands CS, brush teeth CS with cues for appropriate use of objects (soapy hands and toothbrush to his head/hair without initial guidance) Basic HM tasks:  Standing at table surface for sorting of silver ware - min A to maintain stance due to posterior lean, he was able to place objects in appropriate spots  Cog/behavior:  Cooperative, calm, unable to verbalize his wants and needs, sequencing deficit and inappropriate use of objects limit safety Patient remained in w/c with alarm set with wife present  Therapy Documentation Precautions:  Precautions Precautions:  Fall Precaution Comments: Impulsive Restrictions Weight Bearing Restrictions: No General:   Vital Signs: Therapy Vitals Pulse Rate: 67 Resp: 19 BP: 119/76 Patient Position (if appropriate): Sitting Oxygen Therapy SpO2: 99 % O2 Device: Room Air Pain: Pain Assessment Pain Scale: 0-10 Pain Score: 2  Pain Location: Abdomen Pain Descriptors / Indicators: Other (Comment)(patient unable to describe ) Pain Intervention(s): RN made aware;Other (Comment)(may have been associated with needing to have a bowel movement - successful void)  Therapy/Group: Individual Therapy  Carlos Levering 05/23/2018, 3:31 PM

## 2018-05-23 NOTE — Progress Notes (Signed)
Physical Therapy Weekly Progress Note  Patient Details  Name: [name] MRN: [id number] Date of Birth: 1940/08/16  Beginning of progress report period: May 16, 2018 End of progress report period: May 23, 2018   Patient has met 0 of 2 short term goals.  Pt making slow progress towards physical and cognitive goals.  Pt limited by decreased attention and awareness and continues to require mod A with gait and transfers due to impaired balance.  Patient continues to demonstrate the following deficits muscle weakness, decreased coordination and decreased motor planning, decreased attention, decreased awareness, decreased problem solving, decreased safety awareness, decreased memory and delayed processing and decreased sitting balance, decreased standing balance, decreased postural control and decreased balance strategies and therefore will continue to benefit from skilled PT intervention to increase functional independence with mobility.  Patient progressing toward long term goals..  Continue plan of care.  PT Short Term Goals Week 1:  PT Short Term Goal 1 (Week 1): pt will perform functional transfers with min A PT Short Term Goal 1 - Progress (Week 1): Progressing toward goal PT Short Term Goal 2 (Week 1): pt will attend to therapeutic task wtih min A x 2 minutes PT Short Term Goal 2 - Progress (Week 1): Progressing toward goal Week 2:  PT Short Term Goal 1 (Week 2): pt will perform functional transfers with min A PT Short Term Goal 2 (Week 2): pt will attend to therapeutic task x 2 minutes with min A  Skilled Therapeutic Interventions/Progress Updates:  Ambulation/gait training;Discharge planning;Functional mobility training;Therapeutic Activities;Balance/vestibular training;Neuromuscular re-education;Therapeutic Exercise;Wheelchair propulsion/positioning;Cognitive remediation/compensation;DME/adaptive equipment instruction;Pain management;Splinting/orthotics;UE/LE Strength  taining/ROM;Community reintegration;Functional electrical stimulation;Patient/family education;Stair training;UE/LE Coordination activities     [place] 05/23/2018, 8:12 AM

## 2018-05-23 NOTE — Progress Notes (Signed)
Physical Therapy Session Note  Patient Details  Name: [name] MRN: [id number] Date of Birth: [birth year]  Today's Date: 05/23/2018 PT Individual Time: 1000-1054 PT Individual Time Calculation (min): 54 min   Short Term Goals: Week 1:  PT Short Term Goal 1 (Week 1): pt will perform functional transfers with min A PT Short Term Goal 1 - Progress (Week 1): Progressing toward goal PT Short Term Goal 2 (Week 1): pt will attend to therapeutic task wtih min A x 2 minutes PT Short Term Goal 2 - Progress (Week 1): Progressing toward goal Week 2:  PT Short Term Goal 1 (Week 2): pt will perform functional transfers with min A PT Short Term Goal 2 (Week 2): pt will attend to therapeutic task x 2 minutes with min A  Skilled Therapeutic Interventions/Progress Updates:  Pt performs gait with HHA throughout session with min/mod A in controlled environment with manual facilitation due to posterior lean. Pt still with absent balance reactions and decreased awareness of LOB.  Pt performs basketball shooting and bounce passing with min/mod A for balance due to posterior lean.  Pt performs dynavision 1 minute, 4 minutes with min A for balance, supervision for attention to task.  Pt seems to enjoy dynavision task as he stays engaged throughout activity.  nustep with supervision cues for attention x 8 minutes level 5.  Pt with much improved attention this session, still pulling at catheter requiring max redirection. Pt left in bed with mitts applied, alarm set, family present.  Therapy Documentation Precautions:  Precautions Precautions: Fall Precaution Comments: Impulsive Restrictions Weight Bearing Restrictions: No Pain:  no c/o pain   Therapy/Group: Individual Therapy  Dallen Bunte 05/23/2018, 10:53 AM

## 2018-05-24 ENCOUNTER — Inpatient Hospital Stay (HOSPITAL_COMMUNITY): Payer: Medicare Other | Admitting: Occupational Therapy

## 2018-05-24 DIAGNOSIS — N3000 Acute cystitis without hematuria: Secondary | ICD-10-CM

## 2018-05-24 NOTE — Plan of Care (Signed)
  Problem: RH BLADDER ELIMINATION Goal: RH STG MANAGE BLADDER WITH ASSISTANCE Description STG Manage Bladder With min Assistance  Outcome: Not Progressing; pvr q 3-4 hours   Problem: RH SAFETY Goal: RH STG ADHERE TO SAFETY PRECAUTIONS W/ASSISTANCE/DEVICE Description STG Adhere to Safety Precautions With min Assistance and appropriate assistive Device.  Outcome: Not Progressing; telemonitoring

## 2018-05-24 NOTE — Progress Notes (Signed)
[place] PHYSICAL MEDICINE & REHABILITATION PROGRESS NOTE   Subjective/Complaints:  Patient playing checkers with girlfriend.  Still having problems with garbled speech according to girlfriend.  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 No results for input(s): WBC, HGB, HCT, PLT in the last 72 hours. No results for input(s): NA, K, CL, CO2, GLUCOSE, BUN, CREATININE, CALCIUM in the last 72 hours.  Intake/Output Summary (Last 24 hours) at 05/24/2018 1134 Last data filed at 05/24/2018 0945 Gross per 24 hour  Intake 400 ml  Output 675 ml  Net -275 ml     Physical Exam: Vital Signs Blood pressure 116/81, pulse 80, temperature 98.8 F (37.1 C), temperature source Oral, resp. rate 20, height 6' (1.829 m), weight 67.9 kg, SpO2 99 %. Constitutional: No distress . Vital signs reviewed. HEENT: EOMI, oral membranes moist Neck: supple Cardiovascular: RRR without murmur. No JVD    Respiratory: CTA Bilaterally without wheezes or rales. Normal effort    GI: BS +, non-tender, non-distended  Uro: foley, clear urine Neurological: He is alert.  Delayed processing. Less distracted. Some spontaneous appropriate language but usually perseverative garble.  Motor 4/5 all 4 limbs at least. Wearing mits Skin: He is not diaphoretic.  Psych: pleasant, less distracted    Assessment/Plan: 1. Functional deficits secondary to TBI which require 3+ hours per day of interdisciplinary therapy in a comprehensive inpatient rehab setting.  Physiatrist is providing close team supervision and 24 hour management of active medical problems listed below.  Physiatrist and rehab team continue to assess barriers to discharge/monitor patient progress toward functional and medical goals  Care Tool:  Bathing    Body parts bathed by patient: Chest, Abdomen, Right arm, Left arm, Front perineal area, Buttocks, Right upper leg, Left upper  leg, Face   Body parts bathed by helper: Buttocks, Front perineal area, Chest, Right arm, Left arm     Bathing assist Assist Level: Contact Guard/Touching assist     Upper Body Dressing/Undressing Upper body dressing   What is the patient wearing?: Pull over shirt    Upper body assist Assist Level: Contact Guard/Touching assist    Lower Body Dressing/Undressing Lower body dressing      What is the patient wearing?: Pants     Lower body assist Assist for lower body dressing: Minimal Assistance - Patient > 75%     Toileting Toileting    Toileting assist Assist for toileting: Moderate Assistance - Patient 50 - 74%     Transfers Chair/bed transfer  Transfers assist     Chair/bed transfer assist level: Moderate Assistance - Patient 50 - 74%     Locomotion Ambulation   Ambulation assist      Assist level: Moderate Assistance - Patient 50 - 74% Assistive device: Hand held assist Max distance: 150   Walk 10 feet activity   Assist     Assist level: Moderate Assistance - Patient - 50 - 74% Assistive device: Hand held assist   Walk 50 feet activity   Assist Walk 50 feet with 2 turns activity did not occur: Safety/medical concerns  Assist level: Moderate Assistance - Patient - 50 - 74% Assistive device: Hand held assist    Walk 150 feet activity   Assist Walk 150 feet activity did not occur: Safety/medical concerns  Assist level: Moderate Assistance - Patient - 50 - 74%      Walk 10 feet on uneven surface  activity  Assist Walk 10 feet on uneven surfaces activity did not occur: Safety/medical concerns         Wheelchair     Assist Will patient use wheelchair at discharge?: Yes Type of Wheelchair: Manual    Wheelchair assist level: Dependent - Patient 0%      Wheelchair 50 feet with 2 turns activity    Assist        Assist Level: Dependent - Patient 0%   Wheelchair 150 feet activity     Assist           Medical Problem List and Plan: 1.  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  DVT Prophylaxis/Anticoagulation: Pharmaceutical: Other (comment)--Eliquis 3. Pain Management: tylenol prn.  4. Mood: LCSW to follow for evaluation and support.  5. Neuropsych: This patient is not capable of making decisions on his own behalf. 6. Skin/Wound Care: Venous pressure relief measures.  Moisturization as needed recurrent seborrhea 7. Fluids/Electrolytes/Nutrition:  .  -eating 50-100%  -prealbumin low, recheck labs next week  -encourage po, supervision for meals 8. CAD with ICM/CHF/ICD: Monitor for symptoms with activity.  Monitor for signs of overload and check weights daily.  Continue aspirin and metoprolo  - Filed Weights   05/22/18 0510 05/23/18 0500 05/24/18 0551  Weight: 66.2 kg 67.3 kg 67.9 kg     -weights stable/slightly increased  9. A fib with RVR: Monitor heart rate twice daily.  Continue metoprolol twice daily. 10.  Urinary retention: Finasteride 5 mg nightly.     -Urine + for 100k E coli: amoxicillin initiated 11/4---continue for 7 days  -continue flomax and proscar  -remove foley today and begin voiding trial with coude caths if needed 11.  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  Bouts of agitation/sundowning: showing some improvement  -maximize sleep   foley out -hopefully this will help with this behavior  -environmental mods  -trazodone 50mg  at 2100  -continue sleep chart. He is sleeping better.  13.  History of Barrett's esophagus with dysphagia: Continue PPI 14.  Seizures?/ At risk for seizures: On Vimpat twice daily 15. OSA: CPAP     LOS: 9 days A FACE TO FACE EVALUATION WAS  PERFORMED  Charlett Blake 05/24/2018, 11:34 AM

## 2018-05-24 NOTE — Progress Notes (Signed)
Occupational Therapy Session Note  Patient Details  Name: [name] MRN: [id number] Date of Birth: [birth year]  Today's Date: 05/24/2018 OT Individual Time: 1035-1100 OT Individual Time Calculation (min): 25 min   Short Term Goals: Week 2:  OT Short Term Goal 1 (Week 2): t will appropriately use toothbrush/comn wiht min VC OT Short Term Goal 2 (Week 2): Pt will sequence bathing body parts with min VC OT Short Term Goal 3 (Week 2): Pt will initate doffing clothing prior to bathing wiht min VC OT Short Term Goal 4 (Week 2): Pt will transfer to toilet consistently wiht min A and LRAD  Skilled Therapeutic Interventions/Progress Updates:    Pt greeted in w/c with fiance present. ADL needs met. Took pt to dayroom and worked on cognitive remediation and standing balance via familiar task of sorting toolbox. Steady assist for sit<stand and for balance throughout activity. He required max multimodal cues for sustained attention, following 1 step instruction, and correct naming of tools. Redirection frequently needed due to pt perseverating on touching parts of toolbox. He stood ~18 minutes without rest. At end of session he was left with fiance. Safety belt fastened.   Therapy Documentation Precautions:  Precautions Precautions: Fall Precaution Comments: Impulsive Restrictions Weight Bearing Restrictions: No Pain: No s/s pain during tx    ADL:  :     Therapy/Group: Individual Therapy  Criss Bartles A Temima Kutsch 05/24/2018, 12:40 PM

## 2018-05-25 ENCOUNTER — Inpatient Hospital Stay (HOSPITAL_COMMUNITY): Payer: Medicare Other | Admitting: Occupational Therapy

## 2018-05-25 NOTE — Plan of Care (Signed)
  Problem: RH BLADDER ELIMINATION Goal: RH STG MANAGE BLADDER WITH ASSISTANCE Description STG Manage Bladder With min Assistance  Outcome: Not Progressing; bladder scan q 4

## 2018-05-25 NOTE — Progress Notes (Signed)
At 2247, bladder scan=400, up to BR to void. Rechecked scan=80cc's. At 0500 bladder scan=450cc's, assisted to BR, recheck=100. Restless around 2115, OOB with assistance, difficult to redirect back to bed. 15-47minutes agreed to go back to bed, no further problems. [name] A

## 2018-05-25 NOTE — Progress Notes (Signed)
[place] PHYSICAL MEDICINE & REHABILITATION PROGRESS NOTE   Subjective/Complaints:  Patient now voiding without significant residuals.  Son at bedside. ROS: Limited due to cognitive/behavioral   Objective:   No results found. No results for input(s): WBC, HGB, HCT, PLT in the last 72 hours. No results for input(s): NA, K, CL, CO2, GLUCOSE, BUN, CREATININE, CALCIUM in the last 72 hours.  Intake/Output Summary (Last 24 hours) at 05/25/2018 1031 Last data filed at 05/25/2018 0925 Gross per 24 hour  Intake 360 ml  Output -  Net 360 ml     Physical Exam: Vital Signs Blood pressure 112/84, pulse 82, temperature 98 F (36.7 C), temperature source Oral, resp. rate 18, height 6' (1.829 m), weight 68.4 kg, SpO2 99 %. Constitutional: No distress . Vital signs reviewed. HEENT: EOMI, oral membranes moist Neck: supple Cardiovascular: RRR without murmur. No JVD    Respiratory: CTA Bilaterally without wheezes or rales. Normal effort    GI: BS +, non-tender, non-distended  Uro: foley, clear urine Neurological: He is alert.  Delayed processing. Less distracted. Some spontaneous appropriate language but usually perseverative garble.  Motor 4/5 all 4 limbs at least.  Not requiring mitts Skin: He is not diaphoretic.  Psych: pleasant, less distracted    Assessment/Plan: 1. Functional deficits secondary to TBI which require 3+ hours per day of interdisciplinary therapy in a comprehensive inpatient rehab setting.  Physiatrist is providing close team supervision and 24 hour management of active medical problems listed below.  Physiatrist and rehab team continue to assess barriers to discharge/monitor patient progress toward functional and medical goals  Care Tool:  Bathing    Body parts bathed by patient: Chest, Abdomen, Right arm, Left arm, Front perineal area, Buttocks, Right upper leg, Left upper leg, Face   Body parts bathed by helper: Buttocks, Front perineal area, Chest, Right arm,  Left arm     Bathing assist Assist Level: Contact Guard/Touching assist     Upper Body Dressing/Undressing Upper body dressing   What is the patient wearing?: Pull over shirt    Upper body assist Assist Level: Contact Guard/Touching assist    Lower Body Dressing/Undressing Lower body dressing      What is the patient wearing?: Pants     Lower body assist Assist for lower body dressing: Minimal Assistance - Patient > 75%     Toileting Toileting    Toileting assist Assist for toileting: Moderate Assistance - Patient 50 - 74%     Transfers Chair/bed transfer  Transfers assist     Chair/bed transfer assist level: Moderate Assistance - Patient 50 - 74%     Locomotion Ambulation   Ambulation assist      Assist level: Moderate Assistance - Patient 50 - 74% Assistive device: Hand held assist Max distance: 150   Walk 10 feet activity   Assist     Assist level: Moderate Assistance - Patient - 50 - 74% Assistive device: Hand held assist   Walk 50 feet activity   Assist Walk 50 feet with 2 turns activity did not occur: Safety/medical concerns  Assist level: Moderate Assistance - Patient - 50 - 74% Assistive device: Hand held assist    Walk 150 feet activity   Assist Walk 150 feet activity did not occur: Safety/medical concerns  Assist level: Moderate Assistance - Patient - 50 - 74%      Walk 10 feet on uneven surface  activity   Assist Walk 10 feet on uneven surfaces activity did not occur: Safety/medical  concerns         Wheelchair     Assist Will patient use wheelchair at discharge?: Yes Type of Wheelchair: Manual    Wheelchair assist level: Dependent - Patient 0%      Wheelchair 50 feet with 2 turns activity    Assist        Assist Level: Dependent - Patient 0%   Wheelchair 150 feet activity     Assist          Medical Problem List and Plan: 1.  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  DVT Prophylaxis/Anticoagulation: Pharmaceutical: Other (comment)--Eliquis 3. Pain Management: tylenol prn.  4. Mood: LCSW to follow for evaluation and support.  5. Neuropsych: This patient is not capable of making decisions on his own behalf. 6. Skin/Wound Care: Venous pressure relief measures.  Moisturization as needed recurrent seborrhea 7. Fluids/Electrolytes/Nutrition:  .  -eating 50-100%, p.o. fluid intake is low, less than 500 cc recorded per day, encourage intake and check BUN/creatinine 11/13  -prealbumin low, recheck labs next week  -encourage po, supervision for meals 8. CAD with ICM/CHF/ICD: Monitor for symptoms with activity.  Monitor for signs of overload and check weights daily.  Continue aspirin and metoprolo  - Filed Weights   05/23/18 0500 05/24/18 0551 05/25/18 0457  Weight: 67.3 kg 67.9 kg 68.4 kg     -weights stable/slightly increased  9. A fib with RVR: Monitor heart rate twice daily.  Continue metoprolol twice daily. 10.  Urinary retention: Finasteride 5 mg nightly.     -Urine + for 100k E coli: amoxicillin initiated 11/4---continue for 7 days  -continue flomax and proscar  -11.  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  Bouts of agitation/sundowning: showing some improvement  -maximize sleep   foley out -less agitated  -environmental mods  -trazodone 50mg  at 2100  -continue sleep chart. He is sleeping better.  13.  History of Barrett's esophagus with dysphagia: Continue PPI 14.  Seizures?/ At risk for seizures: On Vimpat twice daily 15. OSA: CPAP     LOS: 10 days A FACE TO FACE EVALUATION WAS PERFORMED  Charlett Blake 05/25/2018, 10:31 AM

## 2018-05-25 NOTE — Progress Notes (Signed)
Occupational Therapy Session Note  Patient Details  Name: [name] MRN: [id number] Date of Birth: 1940/09/27  Today's Date: 05/25/2018 OT Group Time: 1111-1201 OT Group Time Calculation (min): 50 min  10 minutes missed due to RN care at start of tx   Skilled Therapeutic Interventions/Progress Updates:    Pt engaged in therapeutic w/c level dance group focusing on patient choice, UE/LE strengthening, salience, activity tolerance, and social participation. Pt was guided through various dance-based exercises involving UEs/LEs and trunk. All music was selected by group members. Emphasis placed on motor planning, sustained attention, and standing balance. Pt initially restless when arriving at group, pulling on lap belt. With time and support from other group members, affect visibly brightened, and he often reached out to hold hands with OT and patient on his other side. Pt required min-mod vcs to attend to dance instruction for entirety of song (I.e. Clap hands, or march LEs). Participation noticeably enhanced with 1:1 dance partner. His son was also present and involved in group. At end of session pts son escorted him back to room via w/c.     Therapy Documentation Precautions:  Precautions Precautions: Fall Precaution Comments: Impulsive Restrictions Weight Bearing Restrictions: No Vital Signs: Therapy Vitals Temp: 97.7 F (36.5 C) Pulse Rate: 89 Resp: 18 BP: 133/74 Patient Position (if appropriate): Lying Oxygen Therapy SpO2: (!) 77 % O2 Device: Room Air Pain: No s/s pain during tx    ADL:   :     Therapy/Group: Group Therapy  Phelicia Dantes A Cathalina Barcia 05/25/2018, 4:18 PM

## 2018-05-26 ENCOUNTER — Inpatient Hospital Stay (HOSPITAL_COMMUNITY): Payer: Medicare Other | Admitting: Physical Therapy

## 2018-05-26 ENCOUNTER — Inpatient Hospital Stay (HOSPITAL_COMMUNITY): Payer: Medicare Other | Admitting: Occupational Therapy

## 2018-05-26 ENCOUNTER — Inpatient Hospital Stay (HOSPITAL_COMMUNITY): Payer: Medicare Other | Admitting: Speech Pathology

## 2018-05-26 NOTE — Progress Notes (Signed)
Occupational Therapy Session Note  Patient Details  Name: [name] MRN: [id number] Date of Birth: [birth year]  Today's Date: 05/26/2018 OT Individual Time: 8768-1157 OT Individual Time Calculation (min): 72 min   Short Term Goals: Week 2:  OT Short Term Goal 1 (Week 2): t will appropriately use toothbrush/comn wiht min VC OT Short Term Goal 2 (Week 2): Pt will sequence bathing body parts with min VC OT Short Term Goal 3 (Week 2): Pt will initate doffing clothing prior to bathing wiht min VC OT Short Term Goal 4 (Week 2): Pt will transfer to toilet consistently wiht min A and LRAD  Skilled Therapeutic Interventions/Progress Updates:    Pt greeted in w/c with granddaughter present. Escorted pt back to room from hallway, and he engaged in bathing/dressing at shower level. Tx focus on cognitive remediation, balance, functional transfers, and endurance during bathing, dressing, toileting, and grooming tasks. All functional bathroom transfers completed via stand pivot with Mod A for safe descents to transfer surfaces. Pt showered with steady assist while standing 90% of time. He propped each LE up onto TTB to wash feet, unable to be redirected to sit to complete this task. Dressing completed w/c level sit<stand at sink with HOH for initiation and max vcs for motor planning/orienting clothing items. Educated granddaughter regarding pts apraxia and ways to increase his independence while helping him with these tasks. Sit<stand with steady assist. After pulling up pants, pt attempted to pull off pants and tried to move towards toilet. After toilet transfer, Mod A for clothing mgt with tactile cues for pulling pants vs brief. Pt with successful void of bladder. When sitting back in w/c, pt exhibited ideational apraxia by combing head with toothbrush. HOH for proper use of ADL items while completing grooming/oral care. Pt able to carryover correct use with mod HOH cues. Afterwards, OT propelled pt to unit  calendar and also to windows for engagement in orientation conversations. OT oriented pt x4. He was visibly trying to verbalize when asked questions, but unable to produce comprehensible responses. He weaved around therapist in w/c with [facility] for w/c propulsion technique. With min vcs, he was able to correct path after first bumping into therapist in w/c. Pt was then taken back to room and left with granddaughter. Safety belt fastened.   Therapy Documentation Precautions:  Precautions Precautions: Fall Precaution Comments: Impulsive Restrictions Weight Bearing Restrictions: No Vital Signs: Therapy Vitals Temp: 98.5 F (36.9 C) Pulse Rate: 79 Resp: 18 BP: 106/80 Patient Position (if appropriate): Lying Oxygen Therapy SpO2: 100 % O2 Device: Room Air Pain: No s/s pain during tx    ADL:  :     Therapy/Group: Individual Therapy  Whyatt Klinger A Froilan Mclean 05/26/2018, 4:06 PM

## 2018-05-26 NOTE — Progress Notes (Signed)
[place] PHYSICAL MEDICINE & REHABILITATION PROGRESS NOTE   Subjective/Complaints:  Pt with agitation last night--up most of night. Had to be cathed as well---may have been source? Had voided most of weekend.   ROS: Limited due to cognitive/behavioral    Objective:   No results found. No results for input(s): WBC, HGB, HCT, PLT in the last 72 hours. No results for input(s): NA, K, CL, CO2, GLUCOSE, BUN, CREATININE, CALCIUM in the last 72 hours.  Intake/Output Summary (Last 24 hours) at 05/26/2018 0845 Last data filed at 05/26/2018 0803 Gross per 24 hour  Intake 960 ml  Output 400 ml  Net 560 ml     Physical Exam: Vital Signs Blood pressure 140/79, pulse 60, temperature 98 F (36.7 C), temperature source Oral, resp. rate 18, height 6' (1.829 m), weight 68.3 kg, SpO2 98 %. Constitutional: No distress . Vital signs reviewed. HEENT: EOMI, oral membranes moist Neck: supple Cardiovascular: RRR without murmur. No JVD    Respiratory: CTA Bilaterally without wheezes or rales. Normal effort    GI: BS +, non-tender, non-distended  Neurological: sleeping, slow to arouse.  Motor 4/5 all 4 limbs at least.  Not requiring mitts Skin: He is not diaphoretic.  Psych: flat    Assessment/Plan: 1. Functional deficits secondary to TBI which require 3+ hours per day of interdisciplinary therapy in a comprehensive inpatient rehab setting.  Physiatrist is providing close team supervision and 24 hour management of active medical problems listed below.  Physiatrist and rehab team continue to assess barriers to discharge/monitor patient progress toward functional and medical goals  Care Tool:  Bathing    Body parts bathed by patient: Chest, Abdomen, Right arm, Left arm, Front perineal area, Buttocks, Right upper leg, Left upper leg, Face   Body parts bathed by helper: Buttocks, Front perineal area, Chest, Right arm, Left arm     Bathing assist Assist Level: Contact Guard/Touching assist     Upper Body Dressing/Undressing Upper body dressing   What is the patient wearing?: Pull over shirt    Upper body assist Assist Level: Contact Guard/Touching assist    Lower Body Dressing/Undressing Lower body dressing      What is the patient wearing?: Pants     Lower body assist Assist for lower body dressing: Minimal Assistance - Patient > 75%     Toileting Toileting    Toileting assist Assist for toileting: Maximal Assistance - Patient 25 - 49%     Transfers Chair/bed transfer  Transfers assist     Chair/bed transfer assist level: Moderate Assistance - Patient 50 - 74%     Locomotion Ambulation   Ambulation assist      Assist level: Moderate Assistance - Patient 50 - 74% Assistive device: Hand held assist Max distance: 150   Walk 10 feet activity   Assist     Assist level: Moderate Assistance - Patient - 50 - 74% Assistive device: Hand held assist   Walk 50 feet activity   Assist Walk 50 feet with 2 turns activity did not occur: Safety/medical concerns  Assist level: Moderate Assistance - Patient - 50 - 74% Assistive device: Hand held assist    Walk 150 feet activity   Assist Walk 150 feet activity did not occur: Safety/medical concerns  Assist level: Moderate Assistance - Patient - 50 - 74%      Walk 10 feet on uneven surface  activity   Assist Walk 10 feet on uneven surfaces activity did not occur: Safety/medical concerns  Wheelchair     Assist Will patient use wheelchair at discharge?: Yes Type of Wheelchair: Manual    Wheelchair assist level: Dependent - Patient 0%      Wheelchair 50 feet with 2 turns activity    Assist        Assist Level: Dependent - Patient 0%   Wheelchair 150 feet activity     Assist          Medical Problem List and Plan: 1.  Decline in self-care and mobility as well as severe cognitive deficits secondary to bifrontal and bitemporal encephalomalacia following  intracranial hemorrhage  -Continue CIR therapies including PT, OT, and SLP  2.  DVT Prophylaxis/Anticoagulation: Pharmaceutical: Other (comment)--Eliquis 3. Pain Management: tylenol prn.  4. Mood: LCSW to follow for evaluation and support.  5. Neuropsych: This patient is not capable of making decisions on his own behalf. 6. Skin/Wound Care: Venous pressure relief measures.  Moisturization as needed recurrent seborrhea 7. Fluids/Electrolytes/Nutrition:  .  -eating 50-100%, p.o. fluid intake is low, less than 500 cc recorded per day, encourage intake and check BUN/creatinine 11/12  -prealbumin low, recheck tomorrow  -encourage po, supervision for meals 8. CAD with ICM/CHF/ICD: Monitor for symptoms with activity.  Monitor for signs of overload and check weights daily.  Continue aspirin and metoprolo  - Filed Weights   05/24/18 0551 05/25/18 0457 05/26/18 0510  Weight: 67.9 kg 68.4 kg 68.3 kg     -weights stable/slightly increased  9. A fib with RVR: Monitor heart rate twice daily.  Continue metoprolol twice daily. 10.  Urinary retention:  .     -Urine + for 100k E coli: amoxicillin initiated 11/4--completed  -continue flomax and proscar  -recheck ucx  -I/O cath prn 11.  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  Bouts of agitation/sundowning: showing some improvement  -maximize sleep   foley out   -environmental mods  -trazodone 50mg  at 2100  -continue sleep chart. Had been sleeping better until last night.  13.  History of Barrett's esophagus with dysphagia: Continue PPI 14.  Seizures?/ At risk for seizures: On Vimpat twice daily 15. OSA: CPAP     LOS: 11 days A FACE TO FACE EVALUATION WAS PERFORMED  Meredith Staggers 05/26/2018, 8:45 AM

## 2018-05-26 NOTE — Progress Notes (Signed)
Speech Language Pathology Daily Session Note  Patient Details  Name: [name] MRN: [id number] Date of Birth: 06-26-1941  Today's Date: 05/26/2018 SLP Individual Time: 0930-1030 SLP Individual Time Calculation (min): 60 min  Short Term Goals: Week 2: SLP Short Term Goal 1 (Week 2): Patient will identify functional items from a field of 2 with 25% accuracy and Max A multimodal cues. SLP Short Term Goal 1 - Progress (Week 2): Not met SLP Short Term Goal 2 (Week 2): Patient will follow basic commands within functional tasks with 75% accuracy and Min A multimodal cues. SLP Short Term Goal 2 - Progress (Week 2): Not met SLP Short Term Goal 3 (Week 2): Patient will answer basic yes/no questions with 50% accuracy and Mod A verbal cues.  SLP Short Term Goal 3 - Progress (Week 2): Not met SLP Short Term Goal 4 (Week 2): Patient will verbalize basic wants/needs at word/phrase level with Mod A multimodal cues.  SLP Short Term Goal 4 - Progress (Week 2): Not met SLP Short Term Goal 5 (Week 2): Patient will demonstrate sustained attention to functional task for 5 minutes with Max A multimodal cues.  SLP Short Term Goal 5 - Progress (Week 2): Not met SLP Short Term Goal 6 (Week 2): Patient will demonstrate functional problem solving for basic and familiar tasks with Max A verbal cues.  SLP Short Term Goal 6 - Progress (Week 2): Not met  Skilled Therapeutic Interventions:   Skilled intervention focused on receptive/expressive language skills, use of multiple modes of communication, targeted attention tasks. Pt required verbal/tactile assistance and encouragement to increase alertness and participation in today's session. SLP utilized multiple modes of communication for wh- and y/n questions (I.e. Verbal, written, drawing, images). Pt demonstrated <50% accuracy with y/n ques for personal info, orientation, and ID concrete items, however possibly unreliable d/t tendency to perseverate on "yes". Pt able  to independently verbalize word "sausage" when ID food items from breakfast menu. Given mod verbal/visual cues and encouragement, pt able to repeat 2-syllable concrete words on menu. Pt demonstrated mod distractibility during session (I.e. Looking out window) and required verbal cues to attend to ST tasks every ~2 minutes. Pt completed attention task with color sorting and demonstrated ~60% accuracy with task, however decreased attention as task progressed impeded patients progress. Patient assisted into bed with nursing and family member present, call bell and all necessary items within reach. Continue POC.   Pain Pain Assessment Pain Score: 0-No pain Faces Pain Scale: No hurt  Therapy/Group: Individual Therapy  Loni Beckwith, M.S. CCC-SLP Speech-Language Pathologist  Loni Beckwith 05/26/2018, 10:42 AM

## 2018-05-26 NOTE — Progress Notes (Signed)
Physical Therapy Session Note  Patient Details  Name: [name] MRN: [id number] Date of Birth: Nov 30, 1940  Today's Date: 05/26/2018 PT Individual Time: 0211-1735 and 1100-1140 PT Individual Time Calculation (min): 10 min and 40 min  Short Term Goals: Week 2:  PT Short Term Goal 1 (Week 2): pt will perform functional transfers with min A PT Short Term Goal 2 (Week 2): pt will attend to therapeutic task x 2 minutes with min A  Skilled Therapeutic Interventions/Progress Updates:    session 1: pt in bed with eyes closed.  Pt awake but refusing therapy despite multiple attempts with a variety of choices. Pt becoming agitated stating "damn it! No!".  Pt left in bed with soothing music and lights off to deescalate. Pt left in bed with family present and needs at hand.  Session 2: No c/o pain. Pt in bed and agreeable to therapy.  Gait throughout unit with HHA with min A, decreased posterior lean noted this session.  Nustep x 5 minutes level 5 with mod cuing for attention to task.  Basketball toss with mod A for standing balance, supervision for sitting balance with pt able to attend to shooting baskets x 5 minutes with min A.  dynavision standing with pt requiring max/total cuing this session in sitting and standing. Pt left in bed with needs at hand, alarm set, family present  Therapy Documentation Precautions:  Precautions Precautions: Fall Precaution Comments: Impulsive Restrictions Weight Bearing Restrictions: No Pain: Pain Assessment Pain Score: 0-No pain Faces Pain Scale: No hurt    Therapy/Group: Individual Therapy  Briseida Gittings 05/26/2018, 11:40 AM

## 2018-05-26 NOTE — Progress Notes (Signed)
Physical Therapy Session Note  Patient Details  Name: [name] MRN: [id number] Date of Birth: 03/19/1941  Today's Date: 05/26/2018 PT Individual Time: 1456-1540 PT Individual Time Calculation (min): 44 min   Short Term Goals: Week 1:  PT Short Term Goal 1 (Week 1): pt will perform functional transfers with min A PT Short Term Goal 1 - Progress (Week 1): Progressing toward goal PT Short Term Goal 2 (Week 1): pt will attend to therapeutic task wtih min A x 2 minutes PT Short Term Goal 2 - Progress (Week 1): Progressing toward goal  Skilled Therapeutic Interventions/Progress Updates:  Pt received in w/c with granddaughter Margreta Journey) present but not joining pt during session. Pt unable to recall name of family member in room. Pt with no c/o or behaviors demonstrating pain during session. Pt ambulates room>gym with RUE supported around therapist and min<>mod assist for balance. With BUE support on parallel bar pt performed standing hip/knee flexion with minimal ROM, heel raises, and mini squats with max cuing for number of repetitions and demonstration for technique. Pt ambulates gym>ortho gym>apartment>dayroom>room with ongoing min/mod assist with cuing for increased step length BLE and pt demosntrating impaired heel strike and dorsiflexion. Pt performs car transfer at elevated seat height with min assist and sit>stand from low, compliant couch with min assist. At end of session pt transferred to supine with min assist. Pt left in bed with 4 rails up per family request noted on safety plan, call bell in reach & bed alarm set.  Pt unable to recall current age or birthday (eventually able to recall February) during session. Attempted to use calendar on wall to orient pt.  Therapy Documentation Precautions:  Precautions Precautions: Fall Precaution Comments: Impulsive Restrictions Weight Bearing Restrictions: No    Therapy/Group: Individual Therapy  Waunita Schooner 05/26/2018, 3:49  PM

## 2018-05-26 NOTE — Progress Notes (Signed)
Restless, agitated and combative from 1900-2200, requiring 1- 2 assist. Mittens applied pulled them off with his teeth. Spit pudding in mitten. Unable to redirect. PRN seroquel given at [place]. Personal sitter at bedside. At 2320 bladder scan=500, attempted void, I & O cath 400cc's. Required 3 staff to cath R/T agitation and combativeness. At 0508, up to void, bladder scan=303. Will hold off on cath, get patient up after breakfast to void. Patrici Ranks A

## 2018-05-26 NOTE — Progress Notes (Signed)
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 He had a prolonged hospitalization and required PEG tube for nutritional support. Pt transferred to [facility] on 04/03/18. Pt was found down on 04/23/18 after being unresponsive for a prolonged period of time. He was transferred to [facility] and Code Stroke initiated for workp. CT head was negative for acute changes but showed left temporal and right frontal encephalomalacia due to hemorrhagic contusions. CT also showed evidence of ventriculomegaly with question of normal pressure hydrocephalus. Pt admitted to CIR with functional deficits secondary to bifrontal and bitemporal intracranial hemorrhage resulting in encephalomalacia.  11/11 follow up:  Meds: Tums 200 mg daily, vitamin D 1000U daily, colace 100 mg daily, Ensure Enlive BID, MVI with minerals 1 tablet daily, miralax 1 pkt daily, senokot 2 tablets daily  Pt working with nurse, family present at time of  visit.  Pt denies nausea, diarrhea, or constipation. Noted, last BM 11/3. Family reports pt eating very well.   Diet Order:   Diet Order            DIET DYS 3 Room service appropriate? Yes; Fluid consistency: Thin  Diet effective now              EDUCATION NEEDS:  No education needs have been identified at this time  Skin:  Skin Assessment: Skin Integrity Issues: Skin Integrity Issues:: Other (Comment) Other: MASD to coccyx  Last BM:  11/3 (medium type 6)  Height:  Ht Readings from Last 1 Encounters:  05/15/18 6' (1.829 m)    Weight:  Wt Readings from Last 1 Encounters:  05/26/18 68.3 kg    Ideal Body Weight:  80.9 kg  BMI:  Body mass index is 20.42 kg/m.  Estimated Nutritional Needs:   Kcal:  2100-2300  Protein:  100-115 grams  Fluid:  > 2.1 L  Althea Grimmer, MS, RDN, LDN On-call pager: [phone]

## 2018-05-27 ENCOUNTER — Inpatient Hospital Stay (HOSPITAL_COMMUNITY): Payer: Medicare Other | Admitting: Physical Therapy

## 2018-05-27 ENCOUNTER — Inpatient Hospital Stay (HOSPITAL_COMMUNITY): Payer: Medicare Other

## 2018-05-27 ENCOUNTER — Inpatient Hospital Stay (HOSPITAL_COMMUNITY): Payer: Medicare Other | Admitting: Speech Pathology

## 2018-05-27 LAB — CBC
HEMATOCRIT: 34.8 % — AB (ref 39.0–52.0)
Hemoglobin: 11 g/dL — ABNORMAL LOW (ref 13.0–17.0)
MCH: 31.7 pg (ref 26.0–34.0)
MCHC: 31.6 g/dL (ref 30.0–36.0)
MCV: 100.3 fL — AB (ref 80.0–100.0)
Platelets: 259 10*3/uL (ref 150–400)
RBC: 3.47 MIL/uL — ABNORMAL LOW (ref 4.22–5.81)
RDW: 16.3 % — AB (ref 11.5–15.5)
WBC: 4.2 10*3/uL (ref 4.0–10.5)
nRBC: 0 % (ref 0.0–0.2)

## 2018-05-27 LAB — BASIC METABOLIC PANEL
Anion gap: 7 (ref 5–15)
BUN: 14 mg/dL (ref 8–23)
CO2: 26 mmol/L (ref 22–32)
CREATININE: 0.75 mg/dL (ref 0.61–1.24)
Calcium: 8.9 mg/dL (ref 8.9–10.3)
Chloride: 105 mmol/L (ref 98–111)
GFR calc Af Amer: >60 mL/min (ref >60)
GFR calc non Af Amer: >60 mL/min (ref >60)
GLUCOSE: 92 mg/dL (ref 70–99)
Potassium: 3.7 mmol/L (ref 3.5–5.1)
Sodium: 138 mmol/L (ref 135–145)

## 2018-05-27 LAB — PREALBUMIN: Prealbumin: 21.4 mg/dL (ref 18–38)

## 2018-05-27 NOTE — Progress Notes (Signed)
Speech Language Pathology Daily Session Note  Patient Details  Name: [name] MRN: [id number] Date of Birth: 13-Mar-1941  Today's Date: 05/27/2018 SLP Individual Time: 2111-7356 SLP Individual Time Calculation (min): 55 min  Short Term Goals: Week 2: SLP Short Term Goal 1 (Week 2): Patient will identify functional items from a field of 2 with 25% accuracy and Max A multimodal cues. SLP Short Term Goal 2 (Week 2): Patient will follow basic commands within functional tasks with 75% accuracy and Min A multimodal cues. SLP Short Term Goal 3 (Week 2): Patient will answer basic yes/no questions with 50% accuracy and Mod A verbal cues.  SLP Short Term Goal 4 (Week 2): Patient will verbalize basic wants/needs at word/phrase level with Mod A multimodal cues.  SLP Short Term Goal 5 (Week 2): Patient will demonstrate sustained attention to functional task for 5 minutes with Max A multimodal cues.  SLP Short Term Goal 6 (Week 2): Patient will demonstrate functional problem solving for basic and familiar tasks with Max A verbal cues.   Skilled Therapeutic Interventions: Skilled treatment session focused on cognitive-linguistic goals. Patient appeared restless upon arrival and attempting to get to commode. Patient standing at commode like he was at the sink (hands out) and required total A for problem solving. Patient had a successful bowel movement but required total A for self-care due to attempting to pull up pants prior to wiping.  Patient also perseverative on washing his hair at sink and requiring total A to terminate. Patient attempting to ask clinician for something but perseverative on "earrings." Patient also attempting to ambulate without assistance with one LOB that required clinician to self-correct due to tripping over his leg rest. However, once patient was in the SLP office, his attention improved and he was able to participate in basic language tasks. Patient able to identify functional  items from a field of 2 with 85% accuracy with both written and verbal cues and identify biographical information with 65% accuracy with written and verbal cues. Patient left upright in wheelchair with alarm on, fiance present and all needs within reach. Continue with current plan of care.       Pain No/Denies Pain   Therapy/Group: Individual Therapy  Marica Trentham 05/27/2018, 9:36 AM

## 2018-05-27 NOTE — Progress Notes (Signed)
Physical Therapy Session Note  Patient Details  Name: [name] MRN: [id number] Date of Birth: 12/31/1940  Today's Date: 05/27/2018 PT Individual Time: 1884-1660 PT Individual Time Calculation (min): 67 min   Short Term Goals: Week 2:  PT Short Term Goal 1 (Week 2): pt will perform functional transfers with min A PT Short Term Goal 2 (Week 2): pt will attend to therapeutic task x 2 minutes with min A  Skilled Therapeutic Interventions/Progress Updates:   Pt in supine, agreeable to therapy and no c/o pain throughout session. Session focused on functional balance, gait training, and cognitive remediation. Ambulated around unit in between activities w/ min assist w/o AD, manual assist for balance and frequent verbal and visual cues to increased B foot clearance. Ambulated in multiple 150-250' bouts. Performed card matching task in static stance emphasizing reaching low and high as well as working on attention to task. Min-mod verbal cues for successful completion. Worked on dynamic balance and postural control while tossing ball w/ wife, min manual assist for balance. Pt expressing he needed to toilet. Returned to room and performed toilet transfer w/ min assist. Pt maintained static sitting while on toilet w/ supervision, verbal cues for precautions/safety and to not bear down. Ended session sitting safely on toilet while under direct supervision from family members, NT made aware and all needs met.   Therapy Documentation Precautions:  Precautions Precautions: Fall Precaution Comments: Impulsive Restrictions Weight Bearing Restrictions: No Pain: Pain Assessment Pain Scale: 0-10 Pain Score: 0-No pain  Therapy/Group: Individual Therapy   Makin Clent Demark 05/27/2018, 3:58 PM

## 2018-05-27 NOTE — Progress Notes (Signed)
Pt with family out in hallway, staff having difficulty redirecting pt back to room or following commands.  Assisted pt back to room and administered PRN.  Pt in room, able to redirect and left with family and call light in reach. Will continue to monitor.

## 2018-05-27 NOTE — Progress Notes (Signed)
Occupational Therapy Session Note  Patient Details  Name: [name] MRN: [id number] Date of Birth: 1940-11-11  Today's Date: 05/27/2018 OT Individual Time: 1300-1359 OT Individual Time Calculation (min): 59 min    Short Term Goals: Week 2:  OT Short Term Goal 1 (Week 2): t will appropriately use toothbrush/comn wiht min VC OT Short Term Goal 2 (Week 2): Pt will sequence bathing body parts with min VC OT Short Term Goal 3 (Week 2): Pt will initate doffing clothing prior to bathing wiht min VC OT Short Term Goal 4 (Week 2): Pt will transfer to toilet consistently wiht min A and LRAD  Skilled Therapeutic Interventions/Progress Updates:    Session focused on b/d tasks and functional mobility. Pt completed standing level doffing of clothing despite frequent cues to complete in sitting. Min A for balance support provided during functional mobility into walk in shower. Moderate cueing provided for pt to wash body in shower, with manual facilitation provided for initiation and then pt able to continue, washing full body with CGA only. Pt voided urine in the shower. Pt was unable to follow multimodal cueing to dry off in shower before walking out, with several LOB, requiring mod A to correct. Pt donned shirt with set up only, and was able to orient shirt to correct direction when handed balled up. Pt donned and tied shoes with cueing only. Pt completed 150 ft of functional mobility with min HHA, first stopping to complete 5 min on the NuStep, before finishing ambulation to therapy gym. Pt donned boxing gloves and participated in bimanual standing level boxing with cueing for technique. Activity graded for dynamic standing balance and increasing cardiorespiratory demands. Pt returned to his room and was left supine in bed with bed alarm set and family present. No c/o or s/s of pain throughout session.   Therapy Documentation Precautions:  Precautions Precautions: Fall Precaution Comments:  Impulsive Restrictions Weight Bearing Restrictions: No Pain: Pain Assessment Pain Scale: 0-10 Pain Score: 0-No pain   Therapy/Group: Individual Therapy  Curtis Sites 05/27/2018, 2:01 PM

## 2018-05-27 NOTE — Progress Notes (Signed)
Physical Therapy Session Note  Patient Details  Name: [name] MRN: [id number] Date of Birth: [birth year]  Today's Date: 05/27/2018 PT Individual Time: 1100-1155 PT Individual Time Calculation (min): 55 min   Short Term Goals: Week 2:  PT Short Term Goal 1 (Week 2): pt will perform functional transfers with min A PT Short Term Goal 2 (Week 2): pt will attend to therapeutic task x 2 minutes with min A  Skilled Therapeutic Interventions/Progress Updates:    Session focused on functional mobility including transfers, gait and NMR for dynamic standing balance, cognitive remediation during functional tasks, and overall endurance/strengthening. Pt requires overall steadying to min assist for functional mobility including transfers and gait (several bouts of up to about 150') including obstacle negotiation through tight spaces with min assist and gait in distracting environment with verbal cues for increased step length. Utilized Kinetron in standing position to address NMR for balance re-education with cues to maintain feet even on pedals, progressed challenge to doing this while playing cornhole and then engaged in therapeutic game with another patient for turn taking and appropriate social interaction. Pt requires cues for turn taking and initiation but able to maintain attention to this single task x 10 min.  Noted pt to demonstrate difficulty with perception of how to position body for transfers to a chair requiring min physical assist and mod verbal cues. Pt's wife present during session and encouraging though tends to [facility] patient not providing time for him to process and respond.  Therapy Documentation Precautions:  Precautions Precautions: Fall Precaution Comments: Impulsive Restrictions Weight Bearing Restrictions: No    Pain: No complaints of pain.    Therapy/Group: Individual Therapy  [name], PT, DPT, CBIS  05/27/2018, 12:12 PM

## 2018-05-27 NOTE — Progress Notes (Signed)
[place] PHYSICAL MEDICINE & REHABILITATION PROGRESS NOTE   Subjective/Complaints:  Had a much better night. Emptied bladder. Calm this morning  ROS: Limited due to cognitive/behavioral   Objective:   No results found. Recent Labs    05/27/18 0452  WBC 4.2  HGB 11.0*  HCT 34.8*  PLT 259   Recent Labs    05/27/18 0452  NA 138  K 3.7  CL 105  CO2 26  GLUCOSE 92  BUN 14  CREATININE 0.75  CALCIUM 8.9    Intake/Output Summary (Last 24 hours) at 05/27/2018 0848 Last data filed at 05/27/2018 4854 Gross per 24 hour  Intake 300 ml  Output 392 ml  Net -92 ml     Physical Exam: Vital Signs Blood pressure 120/76, pulse 75, temperature 98.1 F (36.7 C), temperature source Oral, resp. rate 18, height 6' (1.829 m), weight 66.3 kg, SpO2 96 %. Constitutional: No distress . Vital signs reviewed. HEENT: EOMI, oral membranes moist Neck: supple Cardiovascular: RRR without murmur. No JVD    Respiratory: CTA Bilaterally without wheezes or rales. Normal effort    GI: BS +, non-tender, non-distended  Neurological: very alert, cooperative, globally aphasic, does follow simple commands and is able to utter spontaneous speech.  Motor 4/5 all 4 limbs at least.    Skin: He is not diaphoretic.  Psych: flat    Assessment/Plan: 1. Functional deficits secondary to TBI which require 3+ hours per day of interdisciplinary therapy in a comprehensive inpatient rehab setting.  Physiatrist is providing close team supervision and 24 hour management of active medical problems listed below.  Physiatrist and rehab team continue to assess barriers to discharge/monitor patient progress toward functional and medical goals  Care Tool:  Bathing    Body parts bathed by patient: Chest, Abdomen, Right arm, Left arm, Front perineal area, Buttocks, Right upper leg, Left upper leg, Face, Right lower leg, Left lower leg   Body parts bathed by helper: Buttocks, Front perineal area, Chest, Right arm,  Left arm     Bathing assist Assist Level: Contact Guard/Touching assist     Upper Body Dressing/Undressing Upper body dressing   What is the patient wearing?: Pull over shirt    Upper body assist Assist Level: Moderate Assistance - Patient 50 - 74%    Lower Body Dressing/Undressing Lower body dressing      What is the patient wearing?: Pants     Lower body assist Assist for lower body dressing: Contact Guard/Touching assist     Toileting Toileting    Toileting assist Assist for toileting: Moderate Assistance - Patient 50 - 74%     Transfers Chair/bed transfer  Transfers assist     Chair/bed transfer assist level: Moderate Assistance - Patient 50 - 74%     Locomotion Ambulation   Ambulation assist      Assist level: Moderate Assistance - Patient 50 - 74% Assistive device: Hand held assist Max distance: 150 ft   Walk 10 feet activity   Assist     Assist level: Moderate Assistance - Patient - 50 - 74% Assistive device: Hand held assist   Walk 50 feet activity   Assist Walk 50 feet with 2 turns activity did not occur: Safety/medical concerns  Assist level: Moderate Assistance - Patient - 50 - 74% Assistive device: Hand held assist    Walk 150 feet activity   Assist Walk 150 feet activity did not occur: Safety/medical concerns  Assist level: Moderate Assistance - Patient - 50 - 74%  Assistive device: Hand held assist    Walk 10 feet on uneven surface  activity   Assist Walk 10 feet on uneven surfaces activity did not occur: Safety/medical concerns         Wheelchair     Assist Will patient use wheelchair at discharge?: Yes Type of Wheelchair: Manual    Wheelchair assist level: Dependent - Patient 0%      Wheelchair 50 feet with 2 turns activity    Assist        Assist Level: Dependent - Patient 0%   Wheelchair 150 feet activity     Assist          Medical Problem List and Plan: 1.  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  DVT Prophylaxis/Anticoagulation: Pharmaceutical: Other (comment)--Eliquis 3. Pain Management: tylenol prn.  4. Mood: LCSW to follow for evaluation and support.  5. Neuropsych: This patient is not capable of making decisions on his own behalf. 6. Skin/Wound Care: Venous pressure relief measures.  Moisturization as needed recurrent seborrhea 7. Fluids/Electrolytes/Nutrition:  .  -intake improving  -I personally reviewed the patient's labs today.  Prealbumin increased to 21 -continue to encourage PO 8. CAD with ICM/CHF/ICD: Monitor for symptoms with activity.  Monitor for signs of overload and check weights daily.  Continue aspirin and metoprolo  - Filed Weights   05/25/18 0457 05/26/18 0510 05/27/18 0549  Weight: 68.4 kg 68.3 kg 66.3 kg     -weights stable 11/12  9. A fib with RVR: Monitor heart rate twice daily.  Continue metoprolol twice daily. 10.  Urinary retention:  .     -Urine + for 100k E coli: amoxicillin initiated 11/4--completed  -continue flomax and proscar  -recheck ucx pending  -I/O cath prn  -voiding more spontaneously 11.  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  Bouts of agitation/sundowning: showing some improvement  -maximize sleep    -environmental mods  -trazodone 50mg  at 2100  -continue sleep chart. Had been sleeping better until last night.  13.  History of Barrett's esophagus with dysphagia: Continue PPI 14.  Seizures?/ At risk for seizures: On Vimpat twice daily 15. OSA: CPAP     LOS: 12 days A FACE TO FACE EVALUATION WAS PERFORMED  Meredith Staggers 05/27/2018, 8:48 AM

## 2018-05-27 NOTE — Plan of Care (Signed)
  Problem: Consults Goal: RH BRAIN INJURY PATIENT EDUCATION Description Description: See Patient Education module for eduction specifics Outcome: Progressing Goal: Skin Care Protocol Initiated - if Braden Score 18 or less Description If consults are not indicated, leave blank or document N/A Outcome: Progressing   Problem: RH BOWEL ELIMINATION Goal: RH STG MANAGE BOWEL WITH ASSISTANCE Description STG Manage Bowel with min Assistance.  Outcome: Progressing Goal: RH STG MANAGE BOWEL W/MEDICATION W/ASSISTANCE Description STG Manage Bowel with Medication with min Assistance.  Outcome: Progressing   Problem: RH BLADDER ELIMINATION Goal: RH STG MANAGE BLADDER WITH ASSISTANCE Description STG Manage Bladder With min Assistance  Outcome: Progressing Goal: RH STG MANAGE BLADDER WITH MEDICATION WITH ASSISTANCE Description STG Manage Bladder With Medication With min Assistance.  Outcome: Progressing   Problem: RH SKIN INTEGRITY Goal: RH STG SKIN FREE OF INFECTION/BREAKDOWN Description Skin to remain free from breakdown while on rehab with min assist.  Outcome: Progressing Goal: RH STG MAINTAIN SKIN INTEGRITY WITH ASSISTANCE Description STG Maintain Skin Integrity With min Assistance.  Outcome: Progressing   Problem: RH SAFETY Goal: RH STG ADHERE TO SAFETY PRECAUTIONS W/ASSISTANCE/DEVICE Description STG Adhere to Safety Precautions With min Assistance and appropriate assistive Device.  Outcome: Progressing   Problem: RH PAIN MANAGEMENT Goal: RH STG PAIN MANAGED AT OR BELOW PT'S PAIN GOAL Description <3 on a 1-10 pain scale  Outcome: Progressing   Problem: RH KNOWLEDGE DEFICIT BRAIN INJURY Goal: RH STG INCREASE KNOWLEDGE OF SELF CARE AFTER BRAIN INJURY Description Patient and family will demonstrate knowledge of how to take care of the patient, manage medications, knowledge of MD appointments with min assist.  Outcome: Progressing Goal: RH STG INCREASE KNOWLEDGE OF  DYSPHAGIA/FLUID INTAKE Description Patient will work to increase fluid intake with supervision from rehab staff.  Outcome: Progressing

## 2018-05-28 ENCOUNTER — Inpatient Hospital Stay (HOSPITAL_COMMUNITY): Payer: Medicare Other

## 2018-05-28 ENCOUNTER — Inpatient Hospital Stay (HOSPITAL_COMMUNITY): Payer: Medicare Other | Admitting: Physical Therapy

## 2018-05-28 ENCOUNTER — Inpatient Hospital Stay (HOSPITAL_COMMUNITY): Payer: Medicare Other | Admitting: Speech Pathology

## 2018-05-28 LAB — URINE CULTURE: Culture: <10000 — AB

## 2018-05-28 NOTE — Progress Notes (Signed)
[facility] PHYSICAL MEDICINE & REHABILITATION PROGRESS NOTE   Subjective/Complaints:  Slept well again last night. Caregiver concerned that he's constipated. (moved bowels twice yesterday)  ROS: limited due to language/communication    Objective:   No results found. Recent Labs    05/27/18 0452  WBC 4.2  HGB 11.0*  HCT 34.8*  PLT 259   Recent Labs    05/27/18 0452  NA 138  K 3.7  CL 105  CO2 26  GLUCOSE 92  BUN 14  CREATININE 0.75  CALCIUM 8.9    Intake/Output Summary (Last 24 hours) at 05/28/2018 0856 Last data filed at 05/28/2018 0837 Gross per 24 hour  Intake 900 ml  Output -  Net 900 ml     Physical Exam: Vital Signs Blood pressure 106/90, pulse 72, temperature 98.5 F (36.9 C), temperature source Oral, resp. rate 18, height 6' (1.829 m), weight 66.2 kg, SpO2 93 %. Constitutional: No distress . Vital signs reviewed. HEENT: EOMI, oral membranes moist Neck: supple Cardiovascular: RRR without murmur. No JVD    Respiratory: CTA Bilaterally without wheezes or rales. Normal effort    GI: BS +, non-tender, non-distended  Neurological: very alert, cooperative, remains globally aphasic, does follow simple commands and is able to utter spontaneous speech.  Motor 5/5 all 4 limbs at least.    Skin: He is not diaphoretic.  Psych: pleasant and cooperative   Assessment/Plan: 1. Functional deficits secondary to TBI which require 3+ hours per day of interdisciplinary therapy in a comprehensive inpatient rehab setting.  Physiatrist is providing close team supervision and 24 hour management of active medical problems listed below.  Physiatrist and rehab team continue to assess barriers to discharge/monitor patient progress toward functional and medical goals  Care Tool:  Bathing    Body parts bathed by patient: Chest, Abdomen, Right arm, Left arm, Front perineal area, Buttocks, Right upper leg, Left upper leg, Face, Right lower leg, Left lower leg   Body parts  bathed by helper: Buttocks, Front perineal area, Chest, Right arm, Left arm     Bathing assist Assist Level: Contact Guard/Touching assist     Upper Body Dressing/Undressing Upper body dressing   What is the patient wearing?: Pull over shirt    Upper body assist Assist Level: Supervision/Verbal cueing    Lower Body Dressing/Undressing Lower body dressing      What is the patient wearing?: Pants     Lower body assist Assist for lower body dressing: Contact Guard/Touching assist     Toileting Toileting    Toileting assist Assist for toileting: Moderate Assistance - Patient 50 - 74%     Transfers Chair/bed transfer  Transfers assist     Chair/bed transfer assist level: Minimal Assistance - Patient > 75%     Locomotion Ambulation   Ambulation assist      Assist level: Minimal Assistance - Patient > 75% Assistive device: Hand held assist Max distance: 150 ft   Walk 10 feet activity   Assist     Assist level: Minimal Assistance - Patient > 75% Assistive device: Hand held assist   Walk 50 feet activity   Assist Walk 50 feet with 2 turns activity did not occur: Safety/medical concerns  Assist level: Minimal Assistance - Patient > 75% Assistive device: Hand held assist    Walk 150 feet activity   Assist Walk 150 feet activity did not occur: Safety/medical concerns  Assist level: Minimal Assistance - Patient > 75% Assistive device: Hand held assist  Walk 10 feet on uneven surface  activity   Assist Walk 10 feet on uneven surfaces activity did not occur: Safety/medical concerns         Wheelchair     Assist Will patient use wheelchair at discharge?: Yes Type of Wheelchair: Manual    Wheelchair assist level: Dependent - Patient 0%      Wheelchair 50 feet with 2 turns activity    Assist        Assist Level: Dependent - Patient 0%   Wheelchair 150 feet activity     Assist          Medical Problem List and  Plan: 1.  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  DVT Prophylaxis/Anticoagulation: Pharmaceutical: Other (comment)--Eliquis 3. Pain Management: tylenol prn.  4. Mood: LCSW to follow for evaluation and support.  5. Neuropsych: This patient is not capable of making decisions on his own behalf. 6. Skin/Wound Care: Venous pressure relief measures.  Moisturization as needed recurrent seborrhea 7. Fluids/Electrolytes/Nutrition:  .  -intake more consistent   Prealbumin increased to 21   -continue to encourage PO 8. CAD with ICM/CHF/ICD: Monitor for symptoms with activity.  Monitor for signs of overload and check weights daily.  Continue aspirin and metoprolo  - Filed Weights   05/26/18 0510 05/27/18 0549 05/28/18 0545  Weight: 68.3 kg 66.3 kg 66.2 kg     -weights stable 11/13 9. A fib with RVR: Monitor heart rate twice daily.  Continue metoprolol twice daily. 10.  Urinary retention:  .     -Urine + for 100k E coli: amoxicillin initiated 11/4--completed  -continue flomax and proscar  -recheck ucx negative  -I/O cath prn  -voiding much improved 11.  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  Bouts of agitation/sundowning:  Much improved  -more consistent sleep    -environmental mods  -trazodone 50mg  at 2100  -continue sleep chart. Had been sleeping better until last night.  13.  History of Barrett's esophagus with dysphagia: Continue PPI 14.  Seizures?/ At risk for seizures: On Vimpat twice daily 15. OSA: CPAP     LOS: 13 days A FACE TO FACE EVALUATION WAS PERFORMED  Meredith Staggers 05/28/2018, 8:56 AM

## 2018-05-28 NOTE — Progress Notes (Signed)
Physical Therapy Session Note  Patient Details  Name: [name] MRN: [id number] Date of Birth: [birth year]  Today's Date: 05/28/2018 PT Individual Time: 1450-1505 PT Individual Time Calculation (min): 15 min   Short Term Goals: Week 2:  PT Short Term Goal 1 (Week 2): pt will perform functional transfers with min A PT Short Term Goal 2 (Week 2): pt will attend to therapeutic task x 2 minutes with min A  Skilled Therapeutic Interventions/Progress Updates:    Pt seen this PM for makeup therapy time for missed time during AM session. Pt received seated on Nustep in dayroom from NT. Pt had become restless in his room and NT brought pt to dayroom for activity. Pt performs Nustep level 4 x 10 min with B UE/LE. Ambulation x 150 ft with no AD and min A with hand-held assist. Pt initially using RW with gait then pushes it away as he prefers to amb with no AD. Pt also wearing a gait belt which he becomes agitated with and removes during session. Pt attempts to remove therapists hands so that he can ambulate independently. Pt agreeable to have therapist provide hand-held assist with gait. Pt left seated in w/c in room with needs in reach, family present. Education with family to call for assist to have pt alarm turned on or patient assist back to bed if they need to leave.  Therapy Documentation Precautions:  Precautions Precautions: Fall Precaution Comments: Impulsive Restrictions Weight Bearing Restrictions: No  Pain Assessment Pain Scale: 0-10 Pain Score: 0-No pain    Therapy/Group: Individual Therapy   Excell Seltzer, PT, DPT  05/28/2018, 3:08 PM

## 2018-05-28 NOTE — Progress Notes (Signed)
Speech Language Pathology Daily Session Note  Patient Details  Name: [name] MRN: [id number] Date of Birth: 1941-06-12  Today's Date: 05/28/2018 SLP Individual Time: 0830-0930 SLP Individual Time Calculation (min): 60 min  Short Term Goals: Week 2: SLP Short Term Goal 1 (Week 2): Patient will identify functional items from a field of 2 with 25% accuracy and Max A multimodal cues. SLP Short Term Goal 2 (Week 2): Patient will follow basic commands within functional tasks with 75% accuracy and Min A multimodal cues. SLP Short Term Goal 3 (Week 2): Patient will answer basic yes/no questions with 50% accuracy and Mod A verbal cues.  SLP Short Term Goal 4 (Week 2): Patient will verbalize basic wants/needs at word/phrase level with Mod A multimodal cues.  SLP Short Term Goal 5 (Week 2): Patient will demonstrate sustained attention to functional task for 5 minutes with Max A multimodal cues.  SLP Short Term Goal 6 (Week 2): Patient will demonstrate functional problem solving for basic and familiar tasks with Max A verbal cues.   Skilled Therapeutic Interventions: Skilled treatment session focused on cognitive-linguistic goals. Upon arrival, patient was awake and sitting upright in the wheelchair while consuming his breakfast meal. Patient required Max A verbal cues for initiation of self-feeding towards end of meal, suspect due to attention.  Patient participated in cognitive-linguistic tasks and required overall Mod A verbal cues for attention. Patient able to identify functional items from a field of 2 with 65% accuracy with both written and verbal cues and identify biographical information with 50% accuracy with written and verbal cues. Patient with verbal perseveration throughout task and required Max A multimodal cues to self-correct. Patient also followed basic directions that focused on color identification with 59% accuracy. Patient left upright in wheelchair with alarm on, fiance present  and all needs within reach. Continue with current plan of care.      Pain Pain Assessment Pain Scale: 0-10 Pain Score: 0-No pain  Therapy/Group: Individual Therapy  Tylasia Fletchall, [place] 05/28/2018, 3:13 PM

## 2018-05-28 NOTE — Progress Notes (Signed)
Occupational Therapy Session Note  Patient Details  Name: [name] MRN: [id number] Date of Birth: November 13, 1940  Today's Date: 05/28/2018 OT Individual Time: 1130-1200 Session 2: 1345-1430 OT Individual Time Calculation (min): 30 min Session 2: 45 min   Short Term Goals: Week 2:  OT Short Term Goal 1 (Week 2): t will appropriately use toothbrush/comn wiht min VC OT Short Term Goal 2 (Week 2): Pt will sequence bathing body parts with min VC OT Short Term Goal 3 (Week 2): Pt will initate doffing clothing prior to bathing wiht min VC OT Short Term Goal 4 (Week 2): Pt will transfer to toilet consistently wiht min A and LRAD  Skilled Therapeutic Interventions/Progress Updates:    Session 1: Session focused on self feeding. Pt was properly positioned in bed to facilitate optimal attention to and engagement with meal. Distractions were limited and then added to challenge pt's sustained attention to meal. Pt loaded fork and brought to mouth with (S) throughout entire meal, occasional cues for pacing and for bite size management. Pt left supine in bed with fiancee present and all needs met.   Session 2: Extensive discussion with pt's family re d/c d/t family anxious re time frame. Pt required mod verbal cueing for bed mobility and tactile cueing to initiate functional mobility to shower. Pt required min HHA to walk into shower, with pt spontaneously using grab bars appropriately. Pt also able to begin washing body with set up and visual cue of soap/sponge. Pt still demonstrating unsafe impulsive exit of shower, requiring redirection to safely sit. Pt donned shirt and pants with cueing only. Pt was left supine in bed with bed alarm set and telesitter present.   Therapy Documentation Precautions:  Precautions Precautions: Fall Precaution Comments: Impulsive Restrictions Weight Bearing Restrictions: No General: General PT Missed Treatment Reason: Patient fatigue    Pain: Pain  Assessment Pain Scale: 0-10 Pain Score: 0-No pain   Therapy/Group: Individual Therapy  Curtis Sites 05/28/2018, 1:58 PM

## 2018-05-28 NOTE — Patient Care Conference (Signed)
Inpatient RehabilitationTeam Conference and Plan of Care Update Date: 05/27/2018   Time: 2:35 PM    Patient Name: [name]      Medical Record Number: [id number]  Date of Birth: 1941/05/24 Sex: Male         Room/Bed: 4W07C/4W07C-01 Payor Info: Payor: MEDICARE / Plan: MEDICARE PART A AND B / Product Type: *No Product type* /    Admitting Diagnosis: TBI  Admit Date/Time:  05/15/2018  1:04 PM Admission Comments: No comment available   Primary Diagnosis:  <principal problem not specified> Principal Problem: <principal problem not specified>  Patient Active Problem List   Diagnosis Date Noted  . TBI (traumatic brain injury) ([place]) 05/15/2018  . ICD (implantable cardioverter-defibrillator) in place 05/21/2016  . 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 Claudication of right lower extremity ([place]) 10/04/2015  . Preop cardiovascular exam 11/16/2014  . Palpitations 08/13/2013  . Chronic atrial fibrillation ([place]) 03/26/2013  . Hyperlipidemia 03/26/2013  . Barrett's esophagus 03/26/2013  . Obstructive sleep apnea 03/26/2013  . Cardiomyopathy, ischemic 12/19/2012  . HTN (hypertension) 12/19/2012  . Hx pulmonary embolism 12/19/2012  . NSVT (nonsustained ventricular tachycardia) ([place]) 10/23/2012  . CAD S/P multiple PCIs 10/23/2012  . PSVT (paroxysmal supraventricular tachycardia) ([place]) 09/30/2012  . Long term (current) use of anticoagulants 09/30/2012    Expected Discharge Date: Expected Discharge Date: 06/06/18  Team Members Present: Physician leading conference: Dr. Alger Simons Social Worker Present: Lennart Pall, LCSW Nurse Present: Isla Pence, RN PT Present: Roderic Ovens, PT OT Present: Benay Pillow, OT SLP Present: Weston Anna, SLP PPS Coordinator present : Ileana Ladd, PT     Current Status/Progress Goal Weekly Team Focus  Medical   improving sleep, agitation decreased. foley out---voiding more completely, UTI rxed  improve cognition and attention  repeat  ucx, nutrition, sleep-=wake   Bowel/Bladder   Patient is continent of bladder and incontinent of bowel and urine at times.. Requires I & O caths prn for urinary retention  Pt will assist with rolling in bed during brief change. Will continue to verbalize need for toileting.   Assist with toileting prn, continue to monitor for retention and cath as ordered.    Swallow/Nutrition/ Hydration   Dys. 3 textures with thin liquids, Max A  Min A  minimize impulsivity    ADL's   Steady assist bathing at shower level, Mod A UB dressing, Min A LB dressing, Mod A stand pivot bathroom transfers, Mod A toileting   24/7 (S) ADLs and transfers  Functional transfers, ADL retraining, balance, cognitive remediation, family education    Mobility   min/mod A with transfers and gait  supervision overall  attention, balance, gait   Communication   Max-Total A  Min-Mod A  verbal expression of basic wants/needs, auditory comprehension of basic information    Safety/Cognition/ Behavioral Observations  [place] Level V, overall Max-Total A   Mod A  attention, problem solving    Pain   Pt denies pain and none noted via face scale  Pain <2  Assess pain every shift and PRN   Skin   MASD noted to sacrum area and old PEG site noted left lower abdomen  maintain skin integrity and prevent skin breakdown.  Promote healing to MASD area and old PEG site.   Assess skin Q shirt and PRN    Rehab Goals Patient on target to meet rehab goals: Yes *See Care Plan and progress notes for long and short-term goals.     Barriers to Discharge  Current Status/Progress Possible Resolutions Date Resolved   Physician    Medical stability;Behavior  cognitive deficits            Nursing                  PT                    OT                  SLP                SW                Discharge Planning/Teaching Needs:  Plan to d/c home with fiance, family members and private duty caregivers providing 24/7 assistance.  Teaching  is ongoing with family here daily.   Team Discussion:  Improving overall;  Language is better but still impaired.  Foley out.  Attention and orientation improving.  Min-mod assist mobility and still needs safety cues.  Has been supervision for ADLs with cues but min/ mod transfers.  Improved sleep. Can be cont b/b with timed toileting.  Revisions to Treatment Plan:  May need to downgrade a couple of PT goals.    Continued Need for Acute Rehabilitation Level of Care: The patient requires daily medical management by a physician with specialized training in physical medicine and rehabilitation for the following conditions: Daily direction of a multidisciplinary physical rehabilitation program to ensure safe treatment while eliciting the highest outcome that is of practical value to the patient.: Yes Daily medical management of patient stability for increased activity during participation in an intensive rehabilitation regime.: Yes Daily analysis of laboratory values and/or radiology reports with any subsequent need for medication adjustment of medical intervention for : Neurological problems;Urological problems;Mood/behavior problems   I attest that I was present, lead the team conference, and concur with the assessment and plan of the team.   Paulino Cork 05/28/2018, 9:39 AM

## 2018-05-28 NOTE — Plan of Care (Signed)
  Problem: Consults Goal: RH BRAIN INJURY PATIENT EDUCATION Description Description: See Patient Education module for eduction specifics Outcome: Progressing Goal: Skin Care Protocol Initiated - if Braden Score 18 or less Description If consults are not indicated, leave blank or document N/A Outcome: Progressing   Problem: RH BOWEL ELIMINATION Goal: RH STG MANAGE BOWEL WITH ASSISTANCE Description STG Manage Bowel with min Assistance.  Outcome: Progressing Goal: RH STG MANAGE BOWEL W/MEDICATION W/ASSISTANCE Description STG Manage Bowel with Medication with min Assistance.  Outcome: Progressing   Problem: RH BLADDER ELIMINATION Goal: RH STG MANAGE BLADDER WITH ASSISTANCE Description STG Manage Bladder With min Assistance  Outcome: Progressing Goal: RH STG MANAGE BLADDER WITH MEDICATION WITH ASSISTANCE Description STG Manage Bladder With Medication With min Assistance.  Outcome: Progressing   Problem: RH SKIN INTEGRITY Goal: RH STG SKIN FREE OF INFECTION/BREAKDOWN Description Skin to remain free from breakdown while on rehab with min assist.  Outcome: Progressing Goal: RH STG MAINTAIN SKIN INTEGRITY WITH ASSISTANCE Description STG Maintain Skin Integrity With min Assistance.  Outcome: Progressing   Problem: RH SAFETY Goal: RH STG ADHERE TO SAFETY PRECAUTIONS W/ASSISTANCE/DEVICE Description STG Adhere to Safety Precautions With min Assistance and appropriate assistive Device.  Outcome: Progressing   Problem: RH PAIN MANAGEMENT Goal: RH STG PAIN MANAGED AT OR BELOW PT'S PAIN GOAL Description <3 on a 1-10 pain scale  Outcome: Progressing   Problem: RH KNOWLEDGE DEFICIT BRAIN INJURY Goal: RH STG INCREASE KNOWLEDGE OF SELF CARE AFTER BRAIN INJURY Description Patient and family will demonstrate knowledge of how to take care of the patient, manage medications, knowledge of MD appointments with min assist.  Outcome: Progressing Goal: RH STG INCREASE KNOWLEDGE OF  DYSPHAGIA/FLUID INTAKE Description Patient will work to increase fluid intake with supervision from rehab staff.  Outcome: Progressing

## 2018-05-28 NOTE — Progress Notes (Signed)
Physical Therapy Session Note  Patient Details  Name: [name] MRN: [id number] Date of Birth: February 08, 1941  Today's Date: 05/28/2018 PT Individual Time: 1000-1045 PT Individual Time Calculation (min): 45 min   Short Term Goals: Week 2:  PT Short Term Goal 1 (Week 2): pt will perform functional transfers with min A PT Short Term Goal 2 (Week 2): pt will attend to therapeutic task x 2 minutes with min A  Skilled Therapeutic Interventions/Progress Updates:    Pt received seated in w/c in room, agreeable to PT. No complaints of pain. Sit to stand with CGA. Ambulation x 150 ft with hand-held min A, v/c to increase BLE clearance and increase step length, pt ambulates with shuffling gait pattern. Nustep level 4 x 10 min with B UE/LE. Attempt to engage pt in cognitive tasks while performing Nustep. Pt perseverates on the number "11" and answers either "11" or "yes" to most questions. Attempt to engage pt in standing balance activity, pt declines to stand and prefers to remain seated in w/c. Seated ball toss with focus on reaching outside BOS. After several tosses pt no longer participates in activity and just sits with hands outstretched to catch ball but once ball is thrown to him makes no motion to catch it. If pt given the ball to throw back to therapist he just drops the ball and is unable to throw it back. Pt agreeable to ambulate back to room. Ambulation x 150 ft with hand-held min A. Sit to supine Supervision. Supine BP 106/70, HR 69, SpO2 98%. Pt falls asleep once back in bed. Pt left supine in bed with needs in reach, bed alarm in place, family member present. Pt missed 15 min of scheduled therapy session due to fatigue and inability to continue participation.  Therapy Documentation Precautions:  Precautions Precautions: Fall Precaution Comments: Impulsive Restrictions Weight Bearing Restrictions: No General: PT Amount of Missed Time (min): 15 Minutes PT Missed Treatment Reason: Patient  fatigue   Therapy/Group: Individual Therapy   Excell Seltzer, PT, DPT  05/28/2018, 10:59 AM

## 2018-05-29 ENCOUNTER — Inpatient Hospital Stay (HOSPITAL_COMMUNITY): Payer: Medicare Other | Admitting: Speech Pathology

## 2018-05-29 ENCOUNTER — Inpatient Hospital Stay (HOSPITAL_COMMUNITY): Payer: Medicare Other

## 2018-05-29 ENCOUNTER — Inpatient Hospital Stay (HOSPITAL_COMMUNITY): Payer: Medicare Other | Admitting: Physical Therapy

## 2018-05-29 DIAGNOSIS — S069X3S Unspecified intracranial injury with loss of consciousness of 1 hour to 5 hours 59 minutes, sequela: Secondary | ICD-10-CM

## 2018-05-29 MED ORDER — QUETIAPINE FUMARATE 25 MG PO TABS: 25.0000 mg | ORAL_TABLET | Freq: Every day | ORAL | Status: DC

## 2018-05-29 MED ORDER — QUETIAPINE FUMARATE 25 MG PO TABS
12.5000 mg | ORAL_TABLET | Freq: Every day | ORAL | Status: DC
  Administered 2018-05-29 – 2018-06-01 (×4): 12.5 mg via ORAL
  Filled 2018-05-29 (×4): qty 1

## 2018-05-29 NOTE — Progress Notes (Signed)
Speech Language Pathology Weekly Progress and Session Note  Patient Details  Name: [name] MRN: [id number] Date of Birth: 11-07-1940  Beginning of progress report period: May 23, 2018 End of progress report period: May 29, 2018  Today's Date: 05/29/2018 SLP Individual Time: 0830-0930 SLP Individual Time Calculation (min): 60 min  Short Term Goals: Week 2: SLP Short Term Goal 1 (Week 2): Patient will identify functional items from a field of 2 with 25% accuracy and Max A multimodal cues. SLP Short Term Goal 1 - Progress (Week 2): Met SLP Short Term Goal 2 (Week 2): Patient will follow basic commands within functional tasks with 75% accuracy and Min A multimodal cues. SLP Short Term Goal 2 - Progress (Week 2): Not met SLP Short Term Goal 3 (Week 2): Patient will answer basic yes/no questions with 50% accuracy and Mod A verbal cues.  SLP Short Term Goal 3 - Progress (Week 2): Not met SLP Short Term Goal 4 (Week 2): Patient will verbalize basic wants/needs at word/phrase level with Mod A multimodal cues.  SLP Short Term Goal 4 - Progress (Week 2): Not met SLP Short Term Goal 5 (Week 2): Patient will demonstrate sustained attention to functional task for 5 minutes with Max A multimodal cues.  SLP Short Term Goal 5 - Progress (Week 2): Met SLP Short Term Goal 6 (Week 2): Patient will demonstrate functional problem solving for basic and familiar tasks with Max A verbal cues.  SLP Short Term Goal 6 - Progress (Week 2): Met    New Short Term Goals: Week 3: SLP Short Term Goal 1 (Week 3): Patient will identify functional items from a field of 2 with 50% accuracy and Max A multimodal cues. SLP Short Term Goal 2 (Week 3): Patient will follow basic commands within functional tasks with 75% accuracy and Min A multimodal cues. SLP Short Term Goal 3 (Week 3): Patient will answer basic yes/no questions with 50% accuracy and Mod A verbal cues.  SLP Short Term Goal 4 (Week 3): Patient  will verbalize basic wants/needs at word/phrase level with Mod A multimodal cues.  SLP Short Term Goal 5 (Week 3): Patient will demonstrate sustained attention to functional task for 10 minutes with Max A multimodal cues.  SLP Short Term Goal 6 (Week 3): Patient will demonstrate functional problem solving for basic and familiar tasks with Mod A verbal cues.   Weekly Progress Updates:  Patient has demonstrated functional progress in ability to ID functional items with increased accuracy, demonstrate functional problem solving for basic/famliar tasks, and increase sustain attention to functional tasks given visual/verbal/written cues. Pt demonstrates decreased agitation during session as well. Pt continues to demonstrate perseverative and jargon speech during sessions as well as general restlessness with difficulty follow basic commands, answering y/n questions reliably, and verbalizing basic wants/needs d/t severity of receptive/expressive language deficits. Pt with increased spontaneous verbalizations of automatic speech phrases and repetition of simple/familiar phrases given visual cues. SLP to continue addressing expressive/receptive language goals and cognitive goals for enhanced communication skills and functional independence.   Intensity: Minumum of 1-2 x/day, 30 to 90 minutes Frequency: 3 to 5 out of 7 days Duration/Length of Stay: 06/06/18 Treatment/Interventions: Cognitive remediation/compensation;Environmental controls;Multimodal communication approach;Speech/Language Firefighter;Therapeutic Activities;Internal/external aids;Dysphagia/aspiration precaution training;Patient/family education   Daily Session  Skilled Therapeutic Interventions: Skilled interventions focused on expressive/receptive language skills. Daughter present in room today. Family and SLP assisted patient from bed to w/c to participate in today's session. Pt completed simple sentence completion task  given written/verbal cues with ~80% accuracy. With use of family photos, pt able to answer y/n questions with ~60% accuracy, however possibly unreliable d/t patient's perseveration on "yes". With wh-ques using family photos, pt perseverated on jargon words and numbers, requiring max visual/verbal cues for repetition from SLP to combat perseverations. Pt able to state names of family members given phonemic cues and state autmoatic phrases (i.e. i love you) in 3/4 trials given mod verbal/visual/written cues. Pt demonstrated mild frustration and tearfulness towards end of session, requiring encouragement re: patients progress and participation towards recovery. Education provided to daughter re: use of sentence completion during informal conversation when requesting items and use of automatic phrases outside of sessions in order to enhance langauge skills. Daughter verbalized understanding. Pt assisted to commote with NT and daughter present with patient in restroom. Continue POC.    General    Pain Pain Assessment Pain Scale: 0-10 Pain Score: 0-No pain  Therapy/Group: Individual Therapy  Loni Beckwith, M.S. CCC-SLP Speech-Language Pathologist  Loni Beckwith 05/29/2018, 3:05 PM

## 2018-05-29 NOTE — Progress Notes (Signed)
Physical Therapy Session Note  Patient Details  Name: [name] MRN: [id number] Date of Birth: October 26, 1940  Today's Date: 05/29/2018 PT Individual Time: 1300-1330 PT Individual Time Calculation (min): 30 min   Short Term Goals: Week 2:  PT Short Term Goal 1 (Week 2): pt will perform functional transfers with min A PT Short Term Goal 2 (Week 2): pt will attend to therapeutic task x 2 minutes with min A  Skilled Therapeutic Interventions/Progress Updates:    pt rec'd in w/c, ready for therapy. Pt performs gait with much improved balance noted, CGA/min A with decreased posterior lean. Continues with shuffling gait pattern.  Floor transfer performed with pt requiring min/mod cues for sequencing and initiation, min A for transfer.  Standing balance with CGA/min A for basketball dribbling, passing and shooting with pt requiring more frequent cues for attention when fatigued. Pt able to count number of passes up to 4 with min cuing.  kinetron in standing 3 x 30 seconds due to decreased attention and activity tolerance when fatigued at end of session. Overall pt with improved balance and language abilities this session.  Therapy Documentation Precautions:  Precautions Precautions: Fall Precaution Comments: Impulsive Restrictions Weight Bearing Restrictions: No Pain: Pain Assessment Pain Scale: 0-10 Pain Score: 0-No pain    Therapy/Group: Individual Therapy  Skippy Marhefka 05/29/2018, 2:34 PM

## 2018-05-29 NOTE — Progress Notes (Signed)
[facility] PHYSICAL MEDICINE & REHABILITATION PROGRESS NOTE   Subjective/Complaints:  No problems last night. Slept well  ROS: limited due to language/communication    Objective:   No results found. Recent Labs    05/27/18 0452  WBC 4.2  HGB 11.0*  HCT 34.8*  PLT 259   Recent Labs    05/27/18 0452  NA 138  K 3.7  CL 105  CO2 26  GLUCOSE 92  BUN 14  CREATININE 0.75  CALCIUM 8.9    Intake/Output Summary (Last 24 hours) at 05/29/2018 0933 Last data filed at 05/29/2018 0855 Gross per 24 hour  Intake 1000 ml  Output -  Net 1000 ml     Physical Exam: Vital Signs Blood pressure 128/88, pulse 60, temperature 98 F (36.7 C), temperature source Oral, resp. rate 18, height 6' (1.829 m), weight 66.2 kg, SpO2 93 %. Constitutional: No distress . Vital signs reviewed. HEENT: EOMI, oral membranes moist Neck: supple Cardiovascular: RRR without murmur. No JVD    Respiratory: CTA Bilaterally without wheezes or rales. Normal effort    GI: BS +, non-tender, non-distended   Neurological: alert, aphasic  Motor 5/5 all 4 limbs at least.    Skin: He is not diaphoretic.  Psych: pleasant and cooperative   Assessment/Plan: 1. Functional deficits secondary to TBI which require 3+ hours per day of interdisciplinary therapy in a comprehensive inpatient rehab setting.  Physiatrist is providing close team supervision and 24 hour management of active medical problems listed below.  Physiatrist and rehab team continue to assess barriers to discharge/monitor patient progress toward functional and medical goals  Care Tool:  Bathing    Body parts bathed by patient: Chest, Abdomen, Right arm, Left arm, Front perineal area, Buttocks, Right upper leg, Left upper leg, Face, Right lower leg, Left lower leg   Body parts bathed by helper: Buttocks, Front perineal area, Chest, Right arm, Left arm     Bathing assist Assist Level: Contact Guard/Touching assist     Upper Body  Dressing/Undressing Upper body dressing   What is the patient wearing?: Pull over shirt    Upper body assist Assist Level: Supervision/Verbal cueing    Lower Body Dressing/Undressing Lower body dressing      What is the patient wearing?: Pants     Lower body assist Assist for lower body dressing: Contact Guard/Touching assist     Toileting Toileting    Toileting assist Assist for toileting: Contact Guard/Touching assist     Transfers Chair/bed transfer  Transfers assist     Chair/bed transfer assist level: Contact Guard/Touching assist     Locomotion Ambulation   Ambulation assist      Assist level: Minimal Assistance - Patient > 75% Assistive device: Hand held assist Max distance: 150 ft   Walk 10 feet activity   Assist     Assist level: Minimal Assistance - Patient > 75% Assistive device: Hand held assist   Walk 50 feet activity   Assist Walk 50 feet with 2 turns activity did not occur: Safety/medical concerns  Assist level: Minimal Assistance - Patient > 75% Assistive device: Hand held assist    Walk 150 feet activity   Assist Walk 150 feet activity did not occur: Safety/medical concerns  Assist level: Minimal Assistance - Patient > 75% Assistive device: Hand held assist    Walk 10 feet on uneven surface  activity   Assist Walk 10 feet on uneven surfaces activity did not occur: Safety/medical concerns  Wheelchair     Assist Will patient use wheelchair at discharge?: Yes Type of Wheelchair: Manual    Wheelchair assist level: Dependent - Patient 0%      Wheelchair 50 feet with 2 turns activity    Assist        Assist Level: Dependent - Patient 0%   Wheelchair 150 feet activity     Assist          Medical Problem List and Plan: 1.  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  DVT Prophylaxis/Anticoagulation: Pharmaceutical: Other (comment)--Eliquis 3. Pain Management: tylenol prn.  4. Mood: LCSW to follow for evaluation and support.  5. Neuropsych: This patient is not capable of making decisions on his own behalf. 6. Skin/Wound Care: Venous pressure relief measures.  Moisturization as needed recurrent seborrhea 7. Fluids/Electrolytes/Nutrition:  .  -intake better   Prealbumin increased to 21   -continue to encourage PO 8. CAD with ICM/CHF/ICD: Monitor for symptoms with activity.  Monitor for signs of overload and check weights daily.  Continue aspirin and metoprolo  - Filed Weights   05/26/18 0510 05/27/18 0549 05/28/18 0545  Weight: 68.3 kg 66.3 kg 66.2 kg     -weights stable 11/14 9. A fib with RVR: Monitor heart rate twice daily.  Continue metoprolol twice daily. 10.  Urinary retention:  .     -Urine + for 100k E coli: amoxicillin initiated 11/4--completed  -continue flomax and proscar  -recheck ucx negative  -voiding fairly regularly now 11.  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  Bouts of agitation/sundowning:  Much improved  -more consistent sleep    -environmental mods  -trazodone 50mg  at 2100  -continue sleep chart. Had been sleeping better until last night.  13.  History of Barrett's esophagus with dysphagia: Continue PPI 14.  Seizures?/ At risk for seizures: On Vimpat twice daily 15. OSA: CPAP     LOS: 14 days A FACE TO FACE EVALUATION WAS PERFORMED  Meredith Staggers 05/29/2018, 9:33 AM

## 2018-05-29 NOTE — Progress Notes (Signed)
Social Work Patient ID: [name], male   DOB: [birth year], 77 y.o.   MRN: [id number]  Met with pt's daughter, granddaughter and fiance yesterday to review team conference and d/c plans.  They are all aware that team continues to aim toward target d/c date of 11/22 and supervision goals.  Family with a lot of concerns about pt's behavioral issues here/ agitation more so in the evenings and whether they can manage this at home by the 22nd. They have been planning to hire private duty caregivers, however, they are "not sure that will be enough."  Final decision after lengthy discussion is that family would like to transition from CIR to short term SNF and then home to allow him as much time in a more secure environment before they would assume 24/7 support.   Will alert team.  Lennart Pall, LCSW

## 2018-05-29 NOTE — Progress Notes (Signed)
Occupational Therapy Session Note  Patient Details  Name: [name] MRN: [id number] Date of Birth: Sep 28, 1940  Today's Date: 05/29/2018 OT Individual Time: 1030-1130 Session 2: 1335-1435 OT Individual Time Calculation (min): 60 min Session 2: 60 min    Short Term Goals: Week 2:  OT Short Term Goal 1 (Week 2): t will appropriately use toothbrush/comn wiht min VC OT Short Term Goal 2 (Week 2): Pt will sequence bathing body parts with min VC OT Short Term Goal 3 (Week 2): Pt will initate doffing clothing prior to bathing wiht min VC OT Short Term Goal 4 (Week 2): Pt will transfer to toilet consistently wiht min A and LRAD  Skilled Therapeutic Interventions/Progress Updates:    Session 1: Session focused on task initiation, self-monitoring and sequencing during b/d tasks at shower level. Pt required mod cueing to transfer out of w/c into shower. Pt was unable to ever find a water temperature comfortable, perseverating on changing temperature and being cold. Pt left shower, requiring heavy cues for seated rest break. Pt completed UB dressing with 1 vc only. Pt required min A to thread pants over B LE. Pt became increasingly agitated following shower d/t being cold. Attempted to redirect pt to task- pt began saying "I need to go home"- great improvement in communication. Pt was taken out of room in w/c and brought down to [place] with daughter for change in environment. Pt calmed down significantly and took further redirection well. Pt was left with daughter present in room.   Session 2: Pt received sitting up in w/c with daughter present. Pt completed oral care with min verbal cueing for task progression, requiring no cueing for correct use of toothbrush. RN entered room to administer medication. Pt repeatedly hid pills in mouth and discretely spit in hand/hid pills. Pt laughing throughout this. RN alerted to pt eventually spitting pills in toilet. Pt required moderate cueing to walk into bathroom.  Pt sat on toilet and with water running was able to void urine. Pt completed clothing management with min A. Pt then completed 150 ft of functional mobility with min A. He sat down in dayroom and played "War"- appropriately identifying higher card with one 1 mistake throughout entire game. Pt completed another 150 ft of functional mobility and then returned to room. Pt was left with daughter sitting up in recliner.    Therapy Documentation Precautions:  Precautions Precautions: Fall Precaution Comments: Impulsive Restrictions Weight Bearing Restrictions: No   Vital Signs: Therapy Vitals Pulse Rate: 64 BP: 132/86 Pain: Pain Assessment Pain Scale: 0-10 Pain Score: 0-No pain   Therapy/Group: Individual Therapy  Curtis Sites 05/29/2018, 1:23 PM

## 2018-05-30 ENCOUNTER — Inpatient Hospital Stay (HOSPITAL_COMMUNITY): Payer: Medicare Other

## 2018-05-30 ENCOUNTER — Inpatient Hospital Stay (HOSPITAL_COMMUNITY): Payer: Medicare Other | Admitting: Physical Therapy

## 2018-05-30 ENCOUNTER — Inpatient Hospital Stay (HOSPITAL_COMMUNITY): Payer: Medicare Other | Admitting: Speech Pathology

## 2018-05-30 NOTE — Progress Notes (Signed)
PHYSICAL MEDICINE & REHABILITATION PROGRESS NOTE   Subjective/Complaints:  Up with therapy. No new issues.   ROS: Limited due to cognitive/behavioral    Objective:   No results found. No results for input(s): WBC, HGB, HCT, PLT in the last 72 hours. No results for input(s): NA, K, CL, CO2, GLUCOSE, BUN, CREATININE, CALCIUM in the last 72 hours.  Intake/Output Summary (Last 24 hours) at 05/30/2018 0905 Last data filed at 05/30/2018 0700 Gross per 24 hour  Intake 560 ml  Output -  Net 560 ml     Physical Exam: Vital Signs Blood pressure (!) 163/116, pulse 79, temperature 98.3 F (36.8 C), resp. rate 18, height 6' (1.829 m), weight 70.4 kg, SpO2 96 %. Constitutional: No distress . Vital signs reviewed. HEENT: EOMI, oral membranes moist Neck: supple Cardiovascular: RRR without murmur. No JVD    Respiratory: CTA Bilaterally without wheezes or rales. Normal effort    GI: BS +, non-tender, non-distended   Neurological: alert/aphasic  Motor 5/5 all 4 limbs at least.    Skin: He is not diaphoretic.  Psych: pleasant and cooperative   Assessment/Plan: 1. Functional deficits secondary to TBI which require 3+ hours per day of interdisciplinary therapy in a comprehensive inpatient rehab setting.  Physiatrist is providing close team supervision and 24 hour management of active medical problems listed below.  Physiatrist and rehab team continue to assess barriers to discharge/monitor patient progress toward functional and medical goals  Care Tool:  Bathing    Body parts bathed by patient: Chest, Abdomen, Right arm, Left arm, Front perineal area, Buttocks, Right upper leg, Left upper leg, Face, Right lower leg, Left lower leg   Body parts bathed by helper: Buttocks, Front perineal area, Chest, Right arm, Left arm     Bathing assist Assist Level: Contact Guard/Touching assist     Upper Body Dressing/Undressing Upper body dressing   What is the patient wearing?: Pull  over shirt    Upper body assist Assist Level: Supervision/Verbal cueing    Lower Body Dressing/Undressing Lower body dressing      What is the patient wearing?: Pants     Lower body assist Assist for lower body dressing: Minimal Assistance - Patient > 75%     Toileting Toileting    Toileting assist Assist for toileting: Contact Guard/Touching assist     Transfers Chair/bed transfer  Transfers assist     Chair/bed transfer assist level: Contact Guard/Touching assist     Locomotion Ambulation   Ambulation assist      Assist level: Minimal Assistance - Patient > 75% Assistive device: Hand held assist Max distance: 150 ft   Walk 10 feet activity   Assist     Assist level: Minimal Assistance - Patient > 75% Assistive device: Hand held assist   Walk 50 feet activity   Assist Walk 50 feet with 2 turns activity did not occur: Safety/medical concerns  Assist level: Minimal Assistance - Patient > 75% Assistive device: Hand held assist    Walk 150 feet activity   Assist Walk 150 feet activity did not occur: Safety/medical concerns  Assist level: Minimal Assistance - Patient > 75% Assistive device: Hand held assist    Walk 10 feet on uneven surface  activity   Assist Walk 10 feet on uneven surfaces activity did not occur: Safety/medical concerns         Wheelchair     Assist Will patient use wheelchair at discharge?: Yes Type of Wheelchair: Manual  Wheelchair assist level: Dependent - Patient 0%      Wheelchair 50 feet with 2 turns activity    Assist        Assist Level: Dependent - Patient 0%   Wheelchair 150 feet activity     Assist          Medical Problem List and Plan: 1.  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  DVT  Prophylaxis/Anticoagulation: Pharmaceutical: Other (comment)--Eliquis 3. Pain Management: tylenol prn.  4. Mood: LCSW to follow for evaluation and support.  5. Neuropsych: This patient is not capable of making decisions on his own behalf. 6. Skin/Wound Care: Venous pressure relief measures.  Moisturization as needed recurrent seborrhea 7. Fluids/Electrolytes/Nutrition:  .  -intake improved   Prealbumin increased to 21   -continue to encourage PO  -follow up labs monday 8. CAD with ICM/CHF/ICD: Monitor for symptoms with activity.  Monitor for signs of overload and check weights daily.  Continue aspirin and metoprolol  -elevated standing BP. Question validity. Other BP's fine   -dc orthostatic checks Filed Weights   05/27/18 0549 05/28/18 0545 05/30/18 0639  Weight: 66.3 kg 66.2 kg 70.4 kg     -weights stable 11/14 9. A fib with RVR: Monitor heart rate twice daily.  Continue metoprolol twice daily. 10.  Urinary retention:  .     -Urine + for 100k E coli: amoxicillin initiated 11/4--completed  -continue flomax and proscar  -recheck ucx negative  -voiding regularly now 11.  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  Bouts of agitation/sundowning:  Much improved  -more consistent sleep    -environmental mods  -continue trazodone 50mg  at 2100  -continue sleep chart.   13.  History of Barrett's esophagus with dysphagia: Continue PPI 14.  Seizures?/ At risk for seizures: On Vimpat twice daily 15. OSA: CPAP     LOS: 15 days A FACE TO FACE EVALUATION WAS PERFORMED  Meredith Staggers 05/30/2018, 9:05 AM

## 2018-05-30 NOTE — Progress Notes (Signed)
Occupational Therapy Weekly Progress Note  Patient Details  Name: [name] MRN: [id number] Date of Birth: [birth year]  Beginning of progress report period: May 23, 2018 End of progress report period: May 30, 2018  Today's Date: 05/30/2018 OT Individual Time: 7680-8811 OT Individual Time Calculation (min): 58 min    Patient has met 3 of 4 short term goals.  Pt has made good functional gains in his OT POC. He requires less physical assist during functional mobility and b/d tasks. Pt requires moderate verbal and tactile cueing for task progression, initiation, and for following directions. Pt's family has been educated on types/amounts of cueing, appropriate tasks, and on potential home modifications.   Patient continues to demonstrate the following deficits: muscle weakness, decreased cardiorespiratoy endurance, decreased initiation, decreased attention, decreased awareness, decreased problem solving, decreased safety awareness, decreased memory and delayed processing and decreased standing balance, decreased postural control and decreased balance strategies and therefore will continue to benefit from skilled OT intervention to enhance overall performance with BADL and Reduce care partner burden.  Patient progressing toward long term goals..  Continue plan of care.  OT Short Term Goals Week 2:  OT Short Term Goal 1 (Week 2): t will appropriately use toothbrush/comn wiht min VC OT Short Term Goal 1 - Progress (Week 2): Partly met OT Short Term Goal 2 (Week 2): Pt will sequence bathing body parts with min VC OT Short Term Goal 2 - Progress (Week 2): Met OT Short Term Goal 3 (Week 2): Pt will initate doffing clothing prior to bathing wiht min VC OT Short Term Goal 3 - Progress (Week 2): Met OT Short Term Goal 4 (Week 2): Pt will transfer to toilet consistently wiht min A and LRAD OT Short Term Goal 4 - Progress (Week 2): Met Week 3:  OT Short Term Goal 1 (Week 3): STG=LTG d/t  ELOS  Skilled Therapeutic Interventions/Progress Updates:    Session 1: Session focused on grading sensory stimulation during B UE coordination, dynamic sitting/standing balance, and B UE strengthening. Pt completed standing level toileting with CGA, moderate cueing. Water turned on at sink to cue pt to void bladder- which was successful. Pt completed 150 ft of functional mobility with min HHA. Pt required seated rest break x1. Pt sat EOM in rehab gym and completed functional B UE coordination task while music of choice was played and multiple people walked in and out. Pt was able to maintain sustained attention with min cueing.   Session 2: Session focused on bed level direction following and attention to task. Initially attempted to have pt transfer out of bed, but pt with high levels of fatigue, so continued with bed level. A peg board task was set up anterior to pt with minimal distractions. Pt provided with a far model and was unable with max cueing to follow. Pt was unable to choose between two colors when given verbal cues but was able to spontaneously follow pattern on board once initiated. Pt then completed [place] tower set up, with no verbal cues and initiation of the task. Pt passed off to other OT and verbal report provided.   Therapy Documentation Precautions:  Precautions Precautions: Fall Precaution Comments: Impulsive Restrictions Weight Bearing Restrictions: No Vital Signs: Therapy Vitals Temp: 98.3 F (36.8 C) Pulse Rate: 79 Resp: 18 BP: (!) 163/116 Patient Position (if appropriate): Standing Oxygen Therapy SpO2: 96 % O2 Device: Room Air Pain:  No pain reported   Therapy/Group: Individual Therapy  Curtis Sites 05/30/2018, 10:08 AM

## 2018-05-30 NOTE — Progress Notes (Signed)
Speech Language Pathology Daily Session Note  Patient Details  Name: [name] MRN: [id number] Date of Birth: 1940/11/06  Today's Date: 05/30/2018 SLP Individual Time: 2229-7989 SLP Individual Time Calculation (min): 55 min  Short Term Goals: Week 3: SLP Short Term Goal 1 (Week 3): Patient will identify functional items from a field of 2 with 50% accuracy and Max A multimodal cues. SLP Short Term Goal 2 (Week 3): Patient will follow basic commands within functional tasks with 75% accuracy and Min A multimodal cues. SLP Short Term Goal 3 (Week 3): Patient will answer basic yes/no questions with 50% accuracy and Mod A verbal cues.  SLP Short Term Goal 4 (Week 3): Patient will verbalize basic wants/needs at word/phrase level with Mod A multimodal cues.  SLP Short Term Goal 5 (Week 3): Patient will demonstrate sustained attention to functional task for 10 minutes with Max A multimodal cues.  SLP Short Term Goal 6 (Week 3): Patient will demonstrate functional problem solving for basic and familiar tasks with Mod A verbal cues.   Skilled Therapeutic Interventions:  Pt was seen for skilled ST targeting communication goals.  Pt was sleeping soundly upon therapist's arrival but awakened easily to voice and light touch and was agreeable to participate in [place].  Pt matched object to written word from a field of two for <50% accuracy with max assist multimodal cues.  Pt was able to name objects with max written and verbal cues for ~50% accuracy.  SLP initiated diagnostic treatment of written expression at the word level to further elicit and stimulate language.  Pt was able to copy at the word level with max to total hand over hand assist for letter formation due to perseveration when writing; however, he did have at least 2 instances of being able to spontaneously write the name of a targeted word.  During written tasks, pt was able to also name the targeted object with mod-max sentence completion cues.   Pt was returned to room and left in bed with bed alarm set, wife at bedside, and call bell within reach.  Continue per current plan of care.    Pain Pain Assessment Pain Scale: 0-10 Pain Score: 0-No pain  Therapy/Group: Individual Therapy  Cherise Fedder, Selinda Orion 05/30/2018, 3:18 PM

## 2018-05-30 NOTE — Progress Notes (Signed)
Physical Therapy Session Note  Patient Details  Name: [name] MRN: [id number] Date of Birth: 1941/01/12  Today's Date: 05/30/2018 PT Individual Time: 0730-0827 PT Individual Time Calculation (min): 57 min   Short Term Goals: Week 3:  PT Short Term Goal 1 (Week 3): = LTG  Skilled Therapeutic Interventions/Progress Updates:  Pt in bed and agreeable to therapy.  Pt able to communicate need to use restroom. Pt performs gait to restroom with min A.  Steadying assist for standing balance while urinating.  Cues to wash hands after restroom and min cues for sequencing and memory, min A for balance.  Gait throughout unit with min A with continued shuffling gait pattern despite multimodal cues.  Simulated car transfer with attempts to identify "car parts".  Pt continues to be max A with language.  Pt performs horseshoe toss with min A for balance including reaching to pick up horseshoes off the ground.  Pt performs pipe tree initially with max A, progressing to supervision with repetition of same pipe tree design.  Stretching for pt's hips, calves and hamstrings with pt able to follow visual cues with supervision.  nustep for UE/LE strength and endurance x 8 minutes level 5 with good attention to task.  Pt left in room with needs at hand, family present.  Therapy Documentation Precautions:  Precautions Precautions: Fall Precaution Comments: Impulsive Restrictions Weight Bearing Restrictions: No Pain:  no c/o pain   Therapy/Group: Individual Therapy  Amad Mau 05/30/2018, 8:28 AM

## 2018-05-30 NOTE — Progress Notes (Signed)
Occupational Therapy Session Note  Patient Details  Name: [name] MRN: [id number] Date of Birth: 09-02-1940  Today's Date: 05/30/2018 OT Individual Time: 1100-1200 OT Individual Time Calculation (min): 60 min    Short Term Goals: Week 3:  OT Short Term Goal 1 (Week 3): STG=LTG d/t ELOS  Skilled Therapeutic Interventions/Progress Updates:    OT intervention with focus on functional amb with HHA, task initiation, sequencing, dressing tasks, self feeding, attention to task, and activity tolerance to increase independence with BADLs.  Pt amb with HHA to bathroom for shower but became resistant and agitated once clothing doffed and directed to shower.  Pt continued to resist taking shower.  Pt initiated donning underpants while standing up and resisted CGA from therapist.  Pt amb back into room to sit EOB to don underpants and pants but again resisted CGA when standing.  Pt engaged in eating lunch with max verbal cues from fiancee and occasional scooping of food for pt to eat.  Pt required more than a reasonable amount of time to complete self feeding and required min verbal cues for redirection to task.  Pt remained in bed with bed alarm activated and fiancee present.   Therapy Documentation Precautions:  Precautions Precautions: Fall Precaution Comments: Impulsive Restrictions Weight Bearing Restrictions: No Pain:  Pt with no s/s of pain   Therapy/Group: Individual Therapy  [place], Noyce 05/30/2018, 12:09 PM

## 2018-05-30 NOTE — Progress Notes (Signed)
Physical Therapy Weekly Progress Note  Patient Details  Name: [name] MRN: [id number] Date of Birth: 1941/02/09  Beginning of progress report period: May 23, 2018 End of progress report period: May 30, 2018   Patient has met 2 of 2 short term goals.  Pt improving balance and gait, requiring min A for all mobility.  Pt improving attention and is able to participate in therapeutic tasks with min cuing.  Patient continues to demonstrate the following deficits muscle weakness, impaired timing and sequencing, decreased coordination and decreased motor planning, decreased attention, decreased awareness, decreased problem solving, decreased safety awareness, decreased memory and delayed processing and decreased standing balance, decreased postural control and decreased balance strategies and therefore will continue to benefit from skilled PT intervention to increase functional independence with mobility.  Patient progressing toward long term goals..  Plan of care revisions: d/c plan changed to SNF.  PT Short Term Goals Week 2:  PT Short Term Goal 1 (Week 2): pt will perform functional transfers with min A PT Short Term Goal 1 - Progress (Week 2): Met PT Short Term Goal 2 (Week 2): pt will attend to therapeutic task x 2 minutes with min A PT Short Term Goal 2 - Progress (Week 2): Met Week 3:  PT Short Term Goal 1 (Week 3): = LTG  Skilled Therapeutic Interventions/Progress Updates:  Ambulation/gait training;Discharge planning;Functional mobility training;Therapeutic Activities;Balance/vestibular training;Neuromuscular re-education;Therapeutic Exercise;Wheelchair propulsion/positioning;Cognitive remediation/compensation;DME/adaptive equipment instruction;Pain management;Splinting/orthotics;UE/LE Strength taining/ROM;Community reintegration;Functional electrical stimulation;Patient/family education;Stair training;UE/LE Coordination activities     [place] 05/30/2018, 7:17 AM

## 2018-05-31 ENCOUNTER — Inpatient Hospital Stay (HOSPITAL_COMMUNITY): Payer: Medicare Other | Admitting: Occupational Therapy

## 2018-05-31 ENCOUNTER — Inpatient Hospital Stay (HOSPITAL_COMMUNITY): Payer: Medicare Other | Admitting: Physical Therapy

## 2018-05-31 DIAGNOSIS — I509 Heart failure, unspecified: Secondary | ICD-10-CM

## 2018-05-31 DIAGNOSIS — D62 Acute posthemorrhagic anemia: Secondary | ICD-10-CM

## 2018-05-31 DIAGNOSIS — R339 Retention of urine, unspecified: Secondary | ICD-10-CM

## 2018-05-31 NOTE — Progress Notes (Signed)
Occupational Therapy Session Note  Patient Details  Name: [name] MRN: [id number] Date of Birth: 01/22/1941  Today's Date: 05/31/2018 OT Individual Time: 1062-6948 OT Individual Time Calculation (min): 25 min   Skilled Therapeutic Interventions/Progress Updates:    Pt greeted in w/c with fiance present. Dressed and ready to go. Started tx focusing on motor planning by having pt participate in grooming tasks and oral care while standing at sink. Steady assist for balance, and pt required step by step cues for using ADL items as they were intended, and for meeting motor demands of task. Pt visually distracted by items on sink, so OT removed them and introduced item one at a time as we proceeded with task engagement (I.e. Comb, and then toothbrush/toothpaste). Afterwards, pt initiated ambulation around room with steady assist for balance without device. He walked into bathroom, turned off light and then explored a few dresser drawers. He then ambulated down hallway with steady assist and RW, per pt initiating use of device. He required steady assist, and questioning cues to locate his room again. Pt also required redirection at end of [employer], as he tried to go through emergency exit door. Pt was left in room with safety belt fastened and fiance present.   Therapy Documentation Precautions:  Precautions Precautions: Fall Precaution Comments: Impulsive Restrictions Weight Bearing Restrictions: No Pain: No s/s pain during session    ADL:     Therapy/Group: Individual Therapy  Winnie Barsky A Yaniah Thiemann 05/31/2018, 12:45 PM

## 2018-05-31 NOTE — Plan of Care (Signed)
  Problem: Consults Goal: RH BRAIN INJURY PATIENT EDUCATION Description Description: See Patient Education module for eduction specifics Outcome: Progressing Goal: Skin Care Protocol Initiated - if Braden Score 18 or less Description If consults are not indicated, leave blank or document N/A Outcome: Progressing   Problem: RH BOWEL ELIMINATION Goal: RH STG MANAGE BOWEL WITH ASSISTANCE Description STG Manage Bowel with min Assistance.  Outcome: Progressing Goal: RH STG MANAGE BOWEL W/MEDICATION W/ASSISTANCE Description STG Manage Bowel with Medication with min Assistance.  Outcome: Progressing   Problem: RH BLADDER ELIMINATION Goal: RH STG MANAGE BLADDER WITH ASSISTANCE Description STG Manage Bladder With min Assistance  Outcome: Progressing Goal: RH STG MANAGE BLADDER WITH MEDICATION WITH ASSISTANCE Description STG Manage Bladder With Medication With min Assistance.  Outcome: Progressing   Problem: RH SKIN INTEGRITY Goal: RH STG SKIN FREE OF INFECTION/BREAKDOWN Description Skin to remain free from breakdown while on rehab with min assist.  Outcome: Progressing Goal: RH STG MAINTAIN SKIN INTEGRITY WITH ASSISTANCE Description STG Maintain Skin Integrity With min Assistance.  Outcome: Progressing   Problem: RH SAFETY Goal: RH STG ADHERE TO SAFETY PRECAUTIONS W/ASSISTANCE/DEVICE Description STG Adhere to Safety Precautions With min Assistance and appropriate assistive Device.  Outcome: Progressing   Problem: RH PAIN MANAGEMENT Goal: RH STG PAIN MANAGED AT OR BELOW PT'S PAIN GOAL Description <3 on a 1-10 pain scale  Outcome: Progressing

## 2018-05-31 NOTE — Progress Notes (Signed)
Physical Therapy Session Note  Patient Details  Name: [name] MRN: [id number] Date of Birth: 01-Jan-1941  Today's Date: 05/31/2018 PT Individual Time: 6503-5465 PT Individual Time Calculation (min): 42 min   Short Term Goals: Week 3:  PT Short Term Goal 1 (Week 3): = LTG  Skilled Therapeutic Interventions/Progress Updates:   Pt in w/c and agreeable to therapy, no c/o pain. Per wife, he has been restless today. Ambulated to/from therapy gym w/ min assist via HHA and verbal cues for increasing step length and foot clearance. Performed dynavision in standing to work on attention to task and initiation as well as standing balance. Reaction time average 13.23 seconds and ~10 sec response in L visual field and ~14.5 sec in R visual field. Pt appeared to be seeing double and endorsed this when asked if he saw 1 or 2 red dots. Made RN aware. Pt expressing that he needed to toilet. Returned to room and performed toilet transfer and hand hygiene in standing w/ min assist and mod verbal/visual cues. Ambulated to/from therapy gym 1 more time w/ min assist as detailed above. Worked on increasing speed to facilitate more reciprocal gait pattern w/ larger step length. Returned to room and ended session in supine, all needs in reach.   Therapy Documentation Precautions:  Precautions Precautions: Fall Precaution Comments: Impulsive Restrictions Weight Bearing Restrictions: No  Therapy/Group: Individual Therapy  Kailiana Granquist K Correen Bubolz 05/31/2018, 1:53 PM

## 2018-05-31 NOTE — Progress Notes (Signed)
[facility] PHYSICAL MEDICINE & REHABILITATION PROGRESS NOTE   Subjective/Complaints: Patient seen laying in bed this morning.  He states he slept well overnight.  No reported issues overnight.  Urinary retention discussed with nursing.  ROS: Limited due to cognition  Objective:   No results found. No results for input(s): WBC, HGB, HCT, PLT in the last 72 hours. No results for input(s): NA, K, CL, CO2, GLUCOSE, BUN, CREATININE, CALCIUM in the last 72 hours.  Intake/Output Summary (Last 24 hours) at 05/31/2018 0708 Last data filed at 05/31/2018 0555 Gross per 24 hour  Intake 460 ml  Output 575 ml  Net -115 ml     Physical Exam: Vital Signs Blood pressure 125/78, pulse 69, temperature 97.6 F (36.4 C), temperature source Oral, resp. rate 16, height 6' (1.829 m), weight 70.2 kg, SpO2 99 %. Constitutional: No distress . Vital signs reviewed. HENT: Normocephalic.  Atraumatic. Eyes: EOMI. No discharge. Cardiovascular: RRR. No JVD. Respiratory: CTA Bilaterally. Normal effort. GI: BS +. Non-distended. Musc: No edema or tenderness in extremities. Neurological: Alert Receptive aphasia Motor:?  Limited due to participation Bilateral upper extremities: Grossly 5/5 proximal distal Bilateral lower extremities: Hip flexion 4-/5, distally 5/5 Skin: He is not diaphoretic.  Psych: pleasant and cooperative   Assessment/Plan: 1. Functional deficits secondary to TBI which require 3+ hours per day of interdisciplinary therapy in a comprehensive inpatient rehab setting.  Physiatrist is providing close team supervision and 24 hour management of active medical problems listed below.  Physiatrist and rehab team continue to assess barriers to discharge/monitor patient progress toward functional and medical goals  Care Tool:  Bathing  Bathing activity did not occur: Refused Body parts bathed by patient: Chest, Abdomen, Right arm, Left arm, Front perineal area, Buttocks, Right upper leg, Left  upper leg, Face, Right lower leg, Left lower leg   Body parts bathed by helper: Buttocks, Front perineal area, Chest, Right arm, Left arm     Bathing assist Assist Level: Contact Guard/Touching assist     Upper Body Dressing/Undressing Upper body dressing   What is the patient wearing?: Pull over shirt    Upper body assist Assist Level: Supervision/Verbal cueing    Lower Body Dressing/Undressing Lower body dressing      What is the patient wearing?: Underwear/pull up, Pants     Lower body assist Assist for lower body dressing: Minimal Assistance - Patient > 75%     Toileting Toileting    Toileting assist Assist for toileting: Contact Guard/Touching assist     Transfers Chair/bed transfer  Transfers assist     Chair/bed transfer assist level: Contact Guard/Touching assist     Locomotion Ambulation   Ambulation assist      Assist level: Minimal Assistance - Patient > 75% Assistive device: Hand held assist Max distance: 150 ft   Walk 10 feet activity   Assist     Assist level: Minimal Assistance - Patient > 75% Assistive device: Hand held assist   Walk 50 feet activity   Assist Walk 50 feet with 2 turns activity did not occur: Safety/medical concerns  Assist level: Minimal Assistance - Patient > 75% Assistive device: Hand held assist    Walk 150 feet activity   Assist Walk 150 feet activity did not occur: Safety/medical concerns  Assist level: Minimal Assistance - Patient > 75% Assistive device: Hand held assist    Walk 10 feet on uneven surface  activity   Assist Walk 10 feet on uneven surfaces activity did not occur: Safety/medical  concerns         Wheelchair     Assist Will patient use wheelchair at discharge?: Yes Type of Wheelchair: Manual    Wheelchair assist level: Dependent - Patient 0%      Wheelchair 50 feet with 2 turns activity    Assist        Assist Level: Dependent - Patient 0%   Wheelchair  150 feet activity     Assist          Medical Problem List and Plan: 1.  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  DVT Prophylaxis/Anticoagulation: Pharmaceutical: Other (comment)--Eliquis 3. Pain Management: tylenol prn.  4. Mood: LCSW to follow for evaluation and support.  5. Neuropsych: This patient is not capable of making decisions on his own behalf. 6. Skin/Wound Care: Venous pressure relief measures.  Moisturization as needed recurrent seborrhea 7. Fluids/Electrolytes/Nutrition:  .   Prealbumin increased to 21   -continue to encourage PO  -follow up labs monday 8. CAD with ICM/CHF/ICD: Monitor for symptoms with activity.  Monitor for signs of overload and check weights daily.  Continue aspirin and metoprolol  Filed Weights   05/28/18 0545 05/30/18 0639 05/31/18 0500  Weight: 66.2 kg 70.4 kg 70.2 kg     -weights stable 11/16 9. A fib with RVR: Monitor heart rate twice daily.  Continue metoprolol twice daily. 10.  Urinary retention:  .     -Urine + for 100k E coli: amoxicillin course completed on 11/4  -continue flomax and proscar  -recheck ucx negative 11.  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  Bouts of agitation/sundowning:  Much improved  -more consistent sleep    -environmental mods  -continue trazodone 50mg  at 2100  -continue sleep chart.   13.  History of Barrett's esophagus with dysphagia: Continue PPI 14.  Seizures?/ At risk for seizures: On Vimpat twice daily 15. OSA: CPAP 16.  Acute blood loss anemia  Hemoglobin 11.0 on 11/12  Continue to monitor    LOS: 16 days A FACE TO FACE EVALUATION WAS  PERFORMED  Ankit Lorie Phenix 05/31/2018, 7:08 AM

## 2018-06-01 ENCOUNTER — Inpatient Hospital Stay (HOSPITAL_COMMUNITY): Payer: Medicare Other | Admitting: Occupational Therapy

## 2018-06-01 ENCOUNTER — Inpatient Hospital Stay (HOSPITAL_COMMUNITY): Payer: Medicare Other

## 2018-06-01 DIAGNOSIS — R569 Unspecified convulsions: Secondary | ICD-10-CM

## 2018-06-01 NOTE — Procedures (Signed)
[place] A. Merlene Laughter, MD     [email or web address]           HISTORY: This is a 77 year old male who presents with seizures.  The patient has persistent confusions and therefore EEG is obtained to evaluate for ongoing seizure activity.  MEDICATIONS: Scheduled Meds: . apixaban  5 mg Oral BID  . aspirin EC  81 mg Oral Daily  . calcium carbonate  1 tablet Oral Daily  . cholecalciferol  1,000 Units Oral Daily  . docusate sodium  100 mg Oral Daily  . feeding supplement (ENSURE ENLIVE)  237 mL Oral BID BM  . finasteride  5 mg Oral QHS  . lacosamide  200 mg Oral BID  . Melatonin  6 mg Oral QHS  . metoprolol tartrate  37.5 mg Oral BID  . multivitamin with minerals  1 tablet Oral Daily  . pantoprazole  40 mg Oral Daily  . polyethylene glycol  17 g Oral Daily  . QUEtiapine  12.5 mg Oral Q0600  . senna  2 tablet Oral QHS  . tamsulosin  0.4 mg Oral QPC supper  . traZODone  50 mg Oral QHS   Continuous Infusions: PRN Meds:.acetaminophen, alum & mag hydroxide-simeth, bisacodyl, diphenhydrAMINE, guaiFENesin-dextromethorphan, polyethylene glycol, prochlorperazine **OR** prochlorperazine **OR** prochlorperazine, QUEtiapine, sodium phosphate  Prior to Admission medications   Medication Sig Start Date End Date Taking? Authorizing Provider  acetaminophen (TYLENOL) 500 MG tablet Take 500 mg by mouth every 6 (six) hours as needed (for pain.).     [provider]  aspirin EC 81 MG tablet Take 81 mg by mouth daily.    [provider]  atorvastatin (LIPITOR) 80 MG tablet Take 80 mg by mouth every evening.     [provider]  calcium-vitamin D (OSCAL WITH D) 500-200 MG-UNIT per tablet Take 1 tablet by mouth 2 (two) times daily.     [provider]  carvedilol (COREG) 25 MG tablet Take 1 tablet (25 mg total) by mouth 2 (two) times daily with a meal. 09/18/17   Lorretta Harp, MD  dabigatran (PRADAXA) 150 MG CAPS capsule Take 150 mg by mouth every  12 (twelve) hours.    [provider]  lisinopril (PRINIVIL,ZESTRIL) 10 MG tablet Take 10 mg by mouth every evening.     [provider]  Multiple Vitamins-Minerals (MULTIVITAMIN WITH MINERALS) tablet Take 1 tablet by mouth daily.    [provider]  omeprazole (PRILOSEC) 40 MG capsule Take 40 mg by mouth 2 (two) times daily.     [provider]  polyvinyl alcohol (LIQUIFILM TEARS) 1.4 % ophthalmic solution Place 1 drop into both eyes as needed for dry eyes.     [provider]  vitamin B-12 (CYANOCOBALAMIN) 1000 MCG tablet Take 1,000 mcg by mouth daily.    [provider]      ANALYSIS: A 16 channel recording using standard 10 20 measurements is conducted for 21 minutes.  There is a well-formed posterior dominant rhythm of 9 Hz which attenuates with eye-opening.  There is beta activity observed in the frontal areas.  Awake and drowsy activities are observed.  The recording is degraded that time with myogenic artifact.  There is persistent focal slowing involving the left parietal occipital and temporal areas (P3,O1 and T5).  No clear epileptiform activities are observed.  Photic stimulation and hyperventilation are not conducted.  There are few episodes of head movements and tremors.  No clear electrographic correlates are noted however.  IMPRESSION: 1.  This recording shows persistent left parietal occipital temporal slowing indicating focal cerebral disturbance and/or epileptic focus.  However, there is no evidence of ongoing electrographic seizures or clear epileptiform activities.      Malky Rudzinski A. Merlene Laughter, M.D.  Diplomate, Tax adviser of Psychiatry and Neurology ( Neurology).

## 2018-06-01 NOTE — Progress Notes (Addendum)
At around 10:45 pm RN and NT toileted patient and help him back to bed, V/S taken and were stable. At around 50:55 pm, RN was given pt. PM med when he noticed patient started having seizure. Pt. Was laying in bed stirring at the sealing with his arms very stiff, mouth open unable to swallow the meds in his mouth. Patient was able to respond to his name and follow directions. The seizure activity lasted for about 20 min. Patient was finally able to swallow his med and relax his muscles ans looked at different location. RR was paged for second opinion, MD called but went to voicemail and still waiting for a call back. Family members were notified. We continue to monitor.

## 2018-06-01 NOTE — Consult Note (Addendum)
NEURO HOSPITALIST CONSULT NOTE   Requestig physician: Dr.Swartz   Reason for Consult:seizure   History obtained from:  Son/ chart review HPI:                                                                                                                                          [name] is an 77 y.o. 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 Neurology consulted for seizure activity 05/31/18 PM shift.  Per chart nursing note: about 10:55pm the patient started to have a seizure. Patient was laying in bed starring at the ceiling with arms stiff, mouth open unable to swallow meds. Note also states that patient was about to respond to name and follow directions. The episode lasted 20 minutes. Self resolved. Patient does not recall the episode. Spoke with son. Patient has had 3 previous seizures. They are all described as tonic clonic and patient was completely unresponsive. Not able to follow commands or respond to his name. Per RN  If patient meds are not crushed he will cheek and spit out medications. So there is some question a to whether or not he has actually taken every vimpat dose. No further seizure like activity noted.    Hospital course:  03/08/18 was initially admitted to [facility] for TBI s/p fall, ICH/SAH treated with EVD, prolonged hospitalization with PEG tube and foley for urinary retention. Patient was on aricept, amantidine, and flomax, but they have all been d/c'd d/t orthostasis. 04/23/18: found down and unresponsive was transferred to [facility] as code stroke. Left temporal and right frontal encephalomalacia found, EEG 9/13 During the course of this recording, the EEG showeddiffuse slowing with left hemispheric breach without seizure activity. Started on vimpat 200 mg BID for meningioma resection. .  Past Medical History:  Diagnosis  Date  . A-fib ([place])   . AICD (automatic cardioverter/defibrillator) present 05/21/2016  . Arthritis    "back of my neck extending to back of head" (05/21/2016)  . Barrett's esophagus    Gets periodic endoscopies & biopsies. Coumadin has been held in the past for these precedures.   . Basal cell carcinoma    "scalp; chest; back"  . Blurred vision   . CAD (coronary artery disease)    S/P stenting of his LAD, circumflex & marginal branch as well as his RCA in 2008.  . Cardiomyopathy ([place])    Significant ischemic cardiomyopathy. ECHO 08/27/12: EF = 45-50% (Previous Echo revealed EF = 25%)  . Carotid artery occlusion    Carotid Doppler 08/27/12 SUMMARY - Bilateral ICAs: Demonstrated normal patency without evidence of a significant diamenter reduction, tortuosity or any other  vascular abnormality. This was a Normal carotid duplex Doppler Evaluation.  . Cerebral atherosclerosis    Carotid Doppler 08/27/12 SUMMARY - Bilateral ICAs: Demonstrated normal patency without evidence of a significant diamenter reduction, tortuosity or any other vascular abnormality. This was a Normal carotid duplex Doppler Evaluation.  . CHF (congestive heart failure) ([place])   . Chronic anticoagulation    For PE & PAF  . Claudication ([place])    right ABI of 0.84  . Coronary artery disease    Most recent cath by Dr. Gwenlyn Found 07/02/09 was remarkable for patent stents w/an 80% in-stent stenosis within the obtuse marginal branch stent, as well as 60% ostial RCA stenosis which was stented with a Taxus Liberte drug-eluting stent. Nuc stress test 09/03/12 was low risk & showed an inferolateral scar. & an EF of 39%  . Daily headache    "from the arthritis in back of neck" (05/21/2016)  . DVT (deep venous thrombosis) ([place]) 09/2010   "after knee replacement"  . GERD (gastroesophageal reflux disease)   . H/O mitral valve disorder   . History of hiatal hernia   . History of kidney stones   . History of stomach ulcers   . Hyperlipidemia    . Hypertension   . Meningioma ([place]) 08/15/12   BrainLAB-guided left frontoparietal craniotomy w/removal of a meningioma.   . Obstructive sleep apnea    Sleep Study in 2011 AHI 3.2. "occasionally wear my mask" (05/21/2016)  . Paroxysmal atrial fibrillation ([facility])   . PSVT (paroxysmal supraventricular tachycardia) ([place])    2D ECHO 08/27/12: EF = 45-50% (Previous Echo revealed EF = 25%)  . Pulmonary embolism ([place]) 09/2010   "after knee replacement"  . Shortness of breath dyspnea   . Sick sinus syndrome ([place])   . TIA (transient ischemic attack)     Past Surgical History:  Procedure Laterality Date  . BASAL CELL CARCINOMA EXCISION     "head X 2; back, chest" (05/21/2016)  . CARDIAC CATHETERIZATION     Past caths in 2008, 2006, 2005 & 2003  . CATARACT EXTRACTION W/ INTRAOCULAR LENS  IMPLANT, BILATERAL Bilateral 2005  . CORONARY ANGIOPLASTY WITH STENT PLACEMENT  07/02/09   Most recent cath by Dr. Gwenlyn Found 07/02/09 was remarkable for patent stents w/an 80% in-stent stenosis within the obtuse marginal branch stent, as well as 60% ostial RCA stenosis which was stented with a Taxus Liberte drug-eluting stent.   . CORONARY ANGIOPLASTY WITH STENT PLACEMENT     "I have 12 stents in my heart" (05/21/2016)  . CRANIOTOMY  08/15/2012   At [facility]. BrainLAB-guided left frontoparietal craniotomy w/removal of a meningioma.   . EP IMPLANTABLE DEVICE N/A 05/21/2016   Procedure: ICD Implant;  Surgeon: Evans Lance, MD;  Location: [place] [facility];  Service: Cardiovascular;  Laterality: N/A;  . INGUINAL HERNIA REPAIR Right 11/23/2014   Procedure: HERNIA REPAIR INGUINAL ADULT;  Surgeon: Rochel Brome, MD;  Location: [facility] ORS;  Service: General;  Laterality: Right;  . INGUINAL HERNIA REPAIR Bilateral 2010-2016  . JOINT REPLACEMENT    . THROAT SURGERY     "laser for Barret's esophagus"  . TONSILLECTOMY    . TOTAL KNEE ARTHROPLASTY Left 09/2010   Surgery was complicated by PAF with a pulmonary embolism documented by  CT angiogram.    Family History  Problem Relation Age of Onset  . Cancer Mother   . Cardiomyopathy Sister   . Cancer Other        Breast  Social History:  reports that he quit smoking about 39 years ago. His smoking use included cigarettes. He has a 35.00 pack-year smoking history. He quit smokeless tobacco use about 30 years ago. He reports that he drinks about 28.0 standard drinks of alcohol per week. He reports that he does not use drugs.  No Known Allergies  MEDICATIONS:                                                                                                                     Scheduled: . apixaban  5 mg Oral BID  . aspirin EC  81 mg Oral Daily  . calcium carbonate  1 tablet Oral Daily  . cholecalciferol  1,000 Units Oral Daily  . docusate sodium  100 mg Oral Daily  . feeding supplement (ENSURE ENLIVE)  237 mL Oral BID BM  . finasteride  5 mg Oral QHS  . lacosamide  200 mg Oral BID  . Melatonin  6 mg Oral QHS  . metoprolol tartrate  37.5 mg Oral BID  . multivitamin with minerals  1 tablet Oral Daily  . pantoprazole  40 mg Oral Daily  . polyethylene glycol  17 g Oral Daily  . QUEtiapine  12.5 mg Oral Q0600  . senna  2 tablet Oral QHS  . tamsulosin  0.4 mg Oral QPC supper  . traZODone  50 mg Oral QHS   Continuous:  EPP:IRJJOACZYSAYT, alum & mag hydroxide-simeth, bisacodyl, diphenhydrAMINE, guaiFENesin-dextromethorphan, polyethylene glycol, prochlorperazine **OR** prochlorperazine **OR** prochlorperazine, QUEtiapine, sodium phosphate   ROS:                                                                                                                                        unobtainable from patient due to mental status   Blood pressure 107/69, pulse 63, temperature 97.6 F (36.4 C), resp. rate 20, height 6' (1.829 m), weight 70.2 kg, SpO2 97 %.   General Examination:  Physical Exam  HEENT-  Normocephalic, no lesions, without obvious abnormality.  Normal external eye and conjunctiva.   Cardiovascular- S1-S2 audible, pulses palpable throughout   Lungs-no rhonchi or wheezing noted, no excessive working breathing.  Saturations within normal limits Abdomen- All 4 quadrants palpated and nontender Extremities- Warm, dry and intact Musculoskeletal-no joint tenderness, deformity or swelling Skin-warm and dry, no hyperpigmentation, vitiligo, or suspicious lesions  Neurological Examination Mental Status: Alert, awake oriented to name and birth date only. Unable to state month, year or place.,  Speech fluent without evidence of aphasia. No dysarthria noted. Able to follow commands though it may take him awhile to think and respond to the command. Patient does exhibit a tremor or RUE.  Cranial Nerves: II:  Visual fields grossly normal,  III,IV, VI: ptosis not present, extra-ocular motions intact bilaterally pupils 52mm, equal, round, reactive to light and  V,VII: smile symmetric, facial light touch sensation normal bilaterally VIII: hearing normal bilaterally IX,X: uvula rises symmetrically XI: bilateral shoulder shrug XII: midline tongue extension Motor: Right : Upper extremity   5/5 Left:     Upper extremity   5/5  Lower extremity   5/5  Lower extremity   5/5 Tone and bulk:normal tone throughout; no atrophy noted Sensory:  light touch intact throughout, bilaterally Deep Tendon Reflexes: 2+ and symmetric biceps and patella Cerebellar: No obvious ataxia Gait: deferred   Lab Results: Basic Metabolic Panel: Recent Labs  Lab 05/27/18 0452  NA 138  K 3.7  CL 105  CO2 26  GLUCOSE 92  BUN 14  CREATININE 0.75  CALCIUM 8.9    CBC: Recent Labs  Lab 05/27/18 0452  WBC 4.2  HGB 11.0*  HCT 34.8*  MCV 100.3*  PLT 259    EEG: This recording shows persistent left parietal occipital temporal slowing indicating focal cerebral disturbance  and/or epileptic focus.  However, there is no evidence of ongoing electrographic seizures or clear epileptiform activities.    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 Neurology consulted for seizure activity 05/31/18 PM shift. Routine EEG pending. The description of this seizure is inconsistent with patient's previous seizures. There is also some question as to whether he has actually taken all of his vimpat doses.    Impression: Breakthrough seizure- ? Medication compliance  Recommendations: -rEEG -BMP -continue vimpat  Laurey Morale, MSN, NP-C [facility] [phone]  Attending neurologist's note to follow   06/01/2018, 7:32 AM   NEUROHOSPITALIST ADDENDUM Performed a face to face diagnostic evaluation.   I have reviewed the contents of history and physical exam as documented by PA/ARNP/Resident and agree with above documentation.  I have discussed and formulated the above plan as documented. Edits to the note have been made as needed.  Patient went traumatic brain injury, meningioma, paroxysmal A. fib on Eliquis, history of seizures on 200 mg twice daily Vimpat consulted for possible seizure-like activity.  He is definitely at risk for seizures and EEG that was repeated today left parieto-occipital slowing showing patient was increased risk for seizure.  There is a concern patient is not taking Vimpat correctly may be cheeking some of his medications.   Plan Continue Vimpat, if patient has more seizures would add Keppra 500mg  BID  Seizure precautions   Karena Addison Kathyann Spaugh MD [facility] [phone]   If 7pm to 7am, please call on call as listed on AMION.

## 2018-06-01 NOTE — Progress Notes (Signed)
EEG completed, results pending. 

## 2018-06-01 NOTE — Progress Notes (Signed)
[facility] PHYSICAL MEDICINE & REHABILITATION PROGRESS NOTE   Subjective/Complaints: Patient seen laying in bed this morning.  Sitter at bedside.  Discussed with nursing overnight regarding?  Seizure activity.  Per nursing, patient was staring off, but able to answer name and follow commands.  This self resolved.  ROS: Limited due to cognition  Objective:   No results found. No results for input(s): WBC, HGB, HCT, PLT in the last 72 hours. No results for input(s): NA, K, CL, CO2, GLUCOSE, BUN, CREATININE, CALCIUM in the last 72 hours.  Intake/Output Summary (Last 24 hours) at 06/01/2018 0701 Last data filed at 05/31/2018 1700 Gross per 24 hour  Intake 480 ml  Output 22 ml  Net 458 ml     Physical Exam: Vital Signs Blood pressure 107/69, pulse 63, temperature 97.6 F (36.4 C), resp. rate 20, height 6' (1.829 m), weight 70.2 kg, SpO2 97 %. Constitutional: No distress . Vital signs reviewed. HENT: Normocephalic.  Atraumatic. Eyes: EOMI. No discharge. Cardiovascular: RRR.  No JVD. Respiratory: CTA bilaterally.  Normal effort. GI: BS +. Non-distended. Musc: No edema or tenderness in extremities. Neurological: Alert Receptive aphasia Motor:?  Limited due to participation Bilateral upper extremities: Grossly 5/5 proximal distal, stable Bilateral lower extremities: Hip flexion 4-/5, distally 5/5, stable Skin: He is not diaphoretic.  Psych: pleasant and cooperative   Assessment/Plan: 1. Functional deficits secondary to TBI which require 3+ hours per day of interdisciplinary therapy in a comprehensive inpatient rehab setting.  Physiatrist is providing close team supervision and 24 hour management of active medical problems listed below.  Physiatrist and rehab team continue to assess barriers to discharge/monitor patient progress toward functional and medical goals  Care Tool:  Bathing  Bathing activity did not occur: Refused Body parts bathed by patient: Chest, Abdomen,  Right arm, Left arm, Front perineal area, Buttocks, Right upper leg, Left upper leg, Face, Right lower leg, Left lower leg   Body parts bathed by helper: Buttocks, Front perineal area, Chest, Right arm, Left arm     Bathing assist Assist Level: Contact Guard/Touching assist     Upper Body Dressing/Undressing Upper body dressing   What is the patient wearing?: Pull over shirt    Upper body assist Assist Level: Supervision/Verbal cueing    Lower Body Dressing/Undressing Lower body dressing      What is the patient wearing?: Underwear/pull up, Pants     Lower body assist Assist for lower body dressing: Minimal Assistance - Patient > 75%     Toileting Toileting    Toileting assist Assist for toileting: Moderate Assistance - Patient 50 - 74%     Transfers Chair/bed transfer  Transfers assist     Chair/bed transfer assist level: Moderate Assistance - Patient 50 - 74%     Locomotion Ambulation   Ambulation assist      Assist level: Minimal Assistance - Patient > 75% Assistive device: Hand held assist Max distance: 150 ft   Walk 10 feet activity   Assist     Assist level: Minimal Assistance - Patient > 75% Assistive device: Hand held assist   Walk 50 feet activity   Assist Walk 50 feet with 2 turns activity did not occur: Safety/medical concerns  Assist level: Minimal Assistance - Patient > 75% Assistive device: Hand held assist    Walk 150 feet activity   Assist Walk 150 feet activity did not occur: Safety/medical concerns  Assist level: Minimal Assistance - Patient > 75% Assistive device: Hand held assist  Walk 10 feet on uneven surface  activity   Assist Walk 10 feet on uneven surfaces activity did not occur: Safety/medical concerns         Wheelchair     Assist Will patient use wheelchair at discharge?: Yes Type of Wheelchair: Manual    Wheelchair assist level: Dependent - Patient 0%      Wheelchair 50 feet with 2  turns activity    Assist        Assist Level: Dependent - Patient 0%   Wheelchair 150 feet activity     Assist          Medical Problem List and Plan: 1.  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  However per report patient was able to answer name and follow commands during episode of staring off.  Neurology will order EEG. 2.  DVT Prophylaxis/Anticoagulation: Pharmaceutical: Other (comment)--Eliquis 3. Pain Management: tylenol prn.  4. Mood: LCSW to follow for evaluation and support.  5. Neuropsych: This patient is not capable of making decisions on his own behalf. 6. Skin/Wound Care: Venous pressure relief measures.  Moisturization as needed recurrent seborrhea 7. Fluids/Electrolytes/Nutrition:     Prealbumin increased to 21   -continue to encourage PO  -follow up labs tomorrow 8. CAD with ICM/CHF/ICD: Monitor for symptoms with activity.  Monitor for signs of overload and check weights daily.  Continue aspirin and metoprolol  Filed Weights   05/28/18 0545 05/30/18 0639 05/31/18 0500  Weight: 66.2 kg 70.4 kg 70.2 kg     -weights stable 11/16 9. A fib with RVR: Monitor heart rate twice daily.  Continue metoprolol twice daily. 10.  Urinary retention:  .     -Urine + for 100k E coli: amoxicillin course completed on 11/4  -continue flomax and proscar  -recheck ucx negative 11.  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  Bouts of agitation/sundowning:  Much improved  -more consistent sleep    -environmental mods  -continue trazodone 50mg  at 2100  -continue sleep chart.   13.  History of Barrett's esophagus with dysphagia: Continue PPI 14.  Seizures?/ At risk for seizures: On Vimpat twice daily 15. OSA: CPAP 16.  Acute blood loss  anemia  Hemoglobin 11.0 on 11/12  Continue to monitor    LOS: 17 days A FACE TO FACE EVALUATION WAS PERFORMED  Ankit Lorie Phenix 06/01/2018, 7:01 AM

## 2018-06-01 NOTE — Progress Notes (Signed)
Occupational Therapy Session Note  Patient Details  Name: [name] MRN: [id number] Date of Birth: [birth year]  Today's Date: 06/01/2018 OT Group Time: 1100-1200 OT Group Time Calculation (min): 60 min  Skilled Therapeutic Interventions/Progress Updates:    Pt engaged in therapeutic w/c level dance group focusing on patient choice, UE/LE strengthening, salience, activity tolerance, and social participation. Pt was guided through various dance-based exercises involving UEs/LEs and trunk. All music was selected by group members. Emphasis placed on motor planning and sustained attention. Pt with improvement in attention, restlessness, and motor planning abilities compared to participation during group last week! He was able to continue with dance tasks for duration of song once given demonstrational cues. Exhibited increased social awareness by helping a group member return to sitting in w/c, and then patting him gently on the back while smiling. Pt stood to dance with OT during 1 song with steady assist. Multiple LOBs with Mod A to correct. Family from other group members danced with pt while seated, which appeared to increase motivation and affect. He was taken back to room by son at end of tx.       Therapy Documentation Precautions:  Precautions Precautions: Fall Precaution Comments: Impulsive Restrictions Weight Bearing Restrictions: No Vital Signs: Therapy Vitals Temp: 97.7 F (36.5 C) Pulse Rate: 62 Resp: 16 BP: 125/79 Patient Position (if appropriate): Lying Oxygen Therapy SpO2: 100 % O2 Device: Room Air Pain: No s/s pain during tx    ADL:       Therapy/Group: Group Therapy  Jasime Westergren A Abby Tucholski 06/01/2018, 4:15 PM

## 2018-06-01 NOTE — Plan of Care (Signed)
  Problem: RH BOWEL ELIMINATION Goal: RH STG MANAGE BOWEL WITH ASSISTANCE Description STG Manage Bowel with min Assistance.  Outcome: Progressing Goal: RH STG MANAGE BOWEL W/MEDICATION W/ASSISTANCE Description STG Manage Bowel with Medication with min Assistance.  Outcome: Progressing   Problem: RH BLADDER ELIMINATION Goal: RH STG MANAGE BLADDER WITH ASSISTANCE Description STG Manage Bladder With min Assistance  Outcome: Progressing Goal: RH STG MANAGE BLADDER WITH MEDICATION WITH ASSISTANCE Description STG Manage Bladder With Medication With min Assistance.  Outcome: Progressing   Problem: RH SKIN INTEGRITY Goal: RH STG SKIN FREE OF INFECTION/BREAKDOWN Description Skin to remain free from breakdown while on rehab with min assist.  Outcome: Progressing Goal: RH STG MAINTAIN SKIN INTEGRITY WITH ASSISTANCE Description STG Maintain Skin Integrity With min Assistance.  Outcome: Progressing   Problem: RH PAIN MANAGEMENT Goal: RH STG PAIN MANAGED AT OR BELOW PT'S PAIN GOAL Description <3 on a 1-10 pain scale  Outcome: Progressing   Problem: RH KNOWLEDGE DEFICIT BRAIN INJURY Goal: RH STG INCREASE KNOWLEDGE OF SELF CARE AFTER BRAIN INJURY Description Patient and family will demonstrate knowledge of how to take care of the patient, manage medications, knowledge of MD appointments with min assist.  Outcome: Progressing

## 2018-06-02 ENCOUNTER — Inpatient Hospital Stay (HOSPITAL_COMMUNITY): Payer: Medicare Other | Admitting: Speech Pathology

## 2018-06-02 ENCOUNTER — Inpatient Hospital Stay (HOSPITAL_COMMUNITY): Payer: Medicare Other | Admitting: Physical Therapy

## 2018-06-02 ENCOUNTER — Inpatient Hospital Stay (HOSPITAL_COMMUNITY): Payer: Medicare Other

## 2018-06-02 LAB — CBC
HCT: 39.3 % (ref 39.0–52.0)
HEMOGLOBIN: 12.1 g/dL — AB (ref 13.0–17.0)
MCH: 31.6 pg (ref 26.0–34.0)
MCHC: 30.8 g/dL (ref 30.0–36.0)
MCV: 102.6 fL — ABNORMAL HIGH (ref 80.0–100.0)
NRBC: 0 % (ref 0.0–0.2)
PLATELETS: 222 10*3/uL (ref 150–400)
RBC: 3.83 MIL/uL — AB (ref 4.22–5.81)
RDW: 16.3 % — ABNORMAL HIGH (ref 11.5–15.5)
WBC: 4.2 10*3/uL (ref 4.0–10.5)

## 2018-06-02 LAB — BASIC METABOLIC PANEL
ANION GAP: 6 (ref 5–15)
BUN: 16 mg/dL (ref 8–23)
CALCIUM: 9.1 mg/dL (ref 8.9–10.3)
CO2: 29 mmol/L (ref 22–32)
Chloride: 105 mmol/L (ref 98–111)
Creatinine, Ser: 0.83 mg/dL (ref 0.61–1.24)
Glucose, Bld: 131 mg/dL — ABNORMAL HIGH (ref 70–99)
POTASSIUM: 3.7 mmol/L (ref 3.5–5.1)
Sodium: 140 mmol/L (ref 135–145)

## 2018-06-02 LAB — PREALBUMIN: Prealbumin: 24.6 mg/dL (ref 18–38)

## 2018-06-02 MED ORDER — TRAZODONE HCL 50 MG PO TABS
50.0000 mg | ORAL_TABLET | Freq: Every day | ORAL | Status: DC
  Administered 2018-06-02 – 2018-06-10 (×9): 50 mg via ORAL
  Filled 2018-06-02 (×9): qty 1

## 2018-06-02 MED ORDER — QUETIAPINE FUMARATE 25 MG PO TABS
12.5000 mg | ORAL_TABLET | Freq: Two times a day (BID) | ORAL | Status: DC
  Administered 2018-06-02 – 2018-06-10 (×17): 12.5 mg via ORAL
  Filled 2018-06-02 (×18): qty 1

## 2018-06-02 NOTE — Progress Notes (Signed)
Physical Therapy Session Note  Patient Details  Name: [name] MRN: [id number] Date of Birth: 02/13/1941  Today's Date: 06/02/2018 PT Individual Time: 1000-1030; 1100-1200 PT Individual Time Calculation (min): 30 min and 60 min  Short Term Goals: Week 3:  PT Short Term Goal 1 (Week 3): = LTG  Skilled Therapeutic Interventions/Progress Updates:    Session 1: Pt received seated in w/c in room, agreeable to PT. See pain details below. Sit to stand with min A and no AD. Ambulation x 150 ft with no AD and occasional hand-held min assist for balance. Standing balance playing bean bag toss cornhole game with min A for balance, focus on scanning environment to find bean bags and for safety when reaching for bean bags. Stand pivot transfer chair to/from chair with min A with multimodal cueing for safety. Pt is impulsive when sitting and attempts to sit multiple times throughout therapy session before he is turned all the way around in front of chair. Pt left seated in w/c in room with needs in reach, quick-release belt in place, telesitter present, family present.  Session 2: Pt received seated in w/c in room, agreeable to PT. See pain details below. Sit to stand with min A and no AD. Ambulation x 150 ft with no AD and min A/hand-held assist for balance. V/c with gait for increased BLE clearance and decreased shuffling with gait, pt unable to follow cues to improve gait pattern. Pt exhibits decreased gait speed and increase in shuffling with onset of fatigue. Seated 3# dowel therex 3 x 10 reps: bicep curls, chest press, tricep ext. Seated 4# weighted ball core therex while seated EOM: punch-outs 3 x 10 reps. Attempt to have pt perform PNF diagonals with 4# ball but pt unable to sequence to perform exercise correctly and perseverates on punch-out motion from previous exercise. Standing balance shooting basketball goals with min A for balance, one instance of near LOB requiring mod A to correct and  safely sit. Attempt to have pt count to 10 while shooting baskets to keep track, pt able to repeat 5/10 numbers but unable to keep track of what number he is on. Nustep level 5 x 10 min with B UE/LE for global strengthening and endurance. Pt left seated in w/c in room with needs in reach, chair alarm in place, family present to assist pt with lunch.  Therapy Documentation Precautions:  Precautions Precautions: Fall Precaution Comments: Impulsive Restrictions Weight Bearing Restrictions: No Pain: Pain Assessment Pain Scale: Faces Faces Pain Scale: No hurt    Therapy/Group: Individual Therapy  Excell Seltzer, PT, DPT  06/02/2018, 11:30 AM

## 2018-06-02 NOTE — Progress Notes (Signed)
Physical Therapy Session Note  Patient Details  Name: [name] MRN: [id number] Date of Birth: 11/04/1940  Today's Date: 06/02/2018 PT Individual Time: 1300-1330 PT Individual Time Calculation (min): 30 min   Short Term Goals: Week 3:  PT Short Term Goal 1 (Week 3): = LTG  Skilled Therapeutic Interventions/Progress Updates:    Patient received in bed, very pleasant and willing to participate in therapy but appears oriented to self only and had difficulty remembering family members today. Performed gait training in hallway with MinA for balance and ongoing Mod-max cues for improved step length and BOS as he tends to take very short, shuffling steps and had frequent LOB on turns today. Also incorporated Nustep on level 5 with B UEs/LEs for reciprocal motion training with some carryover noted in gait immediately after Nustep however he quickly reverted to usual gait pattern without cues from therapist. Worked on balance training by playing basketball in PT gym with level of assist varying from min guard to [place] as patient was very unsteady on turns and with poor safety awareness when picking up ball even with cues from PT. Had a difficult time with transfers today with patient "diving" for chair from a distance instead of getting close to chair and following therapist's cues for safety, required [place] for all bed to chair or chair to mat transfers today. He was left in bed with family providing immediate supervision and awaiting next therapist, all needs otherwise met this afternoon.   Therapy Documentation Precautions:  Precautions Precautions: Fall Precaution Comments: Impulsive Restrictions Weight Bearing Restrictions: No General:   Pain: Pain Assessment Pain Scale: Faces Pain Score: 0-No pain Faces Pain Scale: No hurt    Therapy/Group: Individual Therapy  Deniece Ree PT, DPT, CBIS  Supplemental Physical Therapist [facility]    Pager [phone] Acute Rehab Office  [phone]   06/02/2018, 3:36 PM

## 2018-06-02 NOTE — Progress Notes (Signed)
Occupational Therapy Session Note  Patient Details  Name: [name] MRN: [id number] Date of Birth: 09-Jul-1941  Today's Date: 06/02/2018 OT Individual Time: 1400-1500 OT Individual Time Calculation (min): 60 min    Short Term Goals: Week 3:  OT Short Term Goal 1 (Week 3): STG=LTG d/t ELOS  Skilled Therapeutic Interventions/Progress Updates:    Session focused on b.d tasks at shower level and functional mobility. Pt completed functional mobility with min HHA into shower. Pt doffed all clothing in standing with min HHA for balance support. Pt completed all bathing with CGA for balance. Good improvement observed this session with thoroughness and spontaneous participation in bathing at shower level. Pt donned shirt with set up only. CGA provided during LB dressing. Pt then completed 150 ft of functional mobility with min HHA with 2 rest breaks. Pt participated in [facility] task sitting EOM. Pt attempted to lay down several times so assisted pt in returning to room. Pt completed toileting at seated level, voiding bladder with increased time. Pt returned to supine in bed with all needs met, daughter present, and telesitter on. No indications of pain throughout session.   Therapy Documentation Precautions:  Precautions Precautions: Fall Precaution Comments: Impulsive Restrictions Weight Bearing Restrictions: No    Vital Signs: Therapy Vitals Temp: 97.6 F (36.4 C) Temp Source: Oral Pulse Rate: 67 Resp: 18 BP: 110/75 Patient Position (if appropriate): Lying Oxygen Therapy SpO2: 99 % O2 Device: Room Air Pain: Pain Assessment Pain Scale: Faces Pain Score: 0-No pain Faces Pain Scale: No hurt   Therapy/Group: Individual Therapy  Curtis Sites 06/02/2018, 3:47 PM

## 2018-06-02 NOTE — Progress Notes (Signed)
[place] PHYSICAL MEDICINE & REHABILITATION PROGRESS NOTE   Subjective/Complaints: ?seizures this weekend. Sitter states he has been up a lot at night and restless. Sleeping soundly upon arrival toda  ROS: Limited due to cognitive/behavioral    Objective:   No results found. No results for input(s): WBC, HGB, HCT, PLT in the last 72 hours. No results for input(s): NA, K, CL, CO2, GLUCOSE, BUN, CREATININE, CALCIUM in the last 72 hours.  Intake/Output Summary (Last 24 hours) at 06/02/2018 0823 Last data filed at 06/02/2018 0428 Gross per 24 hour  Intake 480 ml  Output 600 ml  Net -120 ml     Physical Exam: Vital Signs Blood pressure 112/67, pulse 67, temperature 98.9 F (37.2 C), temperature source Oral, resp. rate 19, height 6' (1.829 m), weight 67.5 kg, SpO2 100 %. Constitutional: No distress . Vital signs reviewed. HEENT: EOMI, oral membranes moist Neck: supple Cardiovascular: RRR without murmur. No JVD    Respiratory: CTA Bilaterally without wheezes or rales. Normal effort    GI: BS +, non-tender, non-distended  Musc: No edema or tenderness in extremities. Neurological: sleeping. Slow to stir   Skin: He is not diaphoretic.  Psych: sleeping   Assessment/Plan: 1. Functional deficits secondary to TBI which require 3+ hours per day of interdisciplinary therapy in a comprehensive inpatient rehab setting.  Physiatrist is providing close team supervision and 24 hour management of active medical problems listed below.  Physiatrist and rehab team continue to assess barriers to discharge/monitor patient progress toward functional and medical goals  Care Tool:  Bathing  Bathing activity did not occur: Refused Body parts bathed by patient: Chest, Abdomen, Right arm, Left arm, Front perineal area, Buttocks, Right upper leg, Left upper leg, Face, Right lower leg, Left lower leg   Body parts bathed by helper: Buttocks, Front perineal area, Chest, Right arm, Left arm      Bathing assist Assist Level: Contact Guard/Touching assist     Upper Body Dressing/Undressing Upper body dressing   What is the patient wearing?: Pull over shirt    Upper body assist Assist Level: Supervision/Verbal cueing    Lower Body Dressing/Undressing Lower body dressing      What is the patient wearing?: Underwear/pull up, Pants     Lower body assist Assist for lower body dressing: Minimal Assistance - Patient > 75%     Toileting Toileting    Toileting assist Assist for toileting: Moderate Assistance - Patient 50 - 74%     Transfers Chair/bed transfer  Transfers assist     Chair/bed transfer assist level: Moderate Assistance - Patient 50 - 74%     Locomotion Ambulation   Ambulation assist      Assist level: Minimal Assistance - Patient > 75% Assistive device: Hand held assist Max distance: 150 ft   Walk 10 feet activity   Assist     Assist level: Minimal Assistance - Patient > 75% Assistive device: Hand held assist   Walk 50 feet activity   Assist Walk 50 feet with 2 turns activity did not occur: Safety/medical concerns  Assist level: Minimal Assistance - Patient > 75% Assistive device: Hand held assist    Walk 150 feet activity   Assist Walk 150 feet activity did not occur: Safety/medical concerns  Assist level: Minimal Assistance - Patient > 75% Assistive device: Hand held assist    Walk 10 feet on uneven surface  activity   Assist Walk 10 feet on uneven surfaces activity did not occur: Safety/medical concerns  Wheelchair     Assist Will patient use wheelchair at discharge?: Yes Type of Wheelchair: Manual    Wheelchair assist level: Dependent - Patient 0%      Wheelchair 50 feet with 2 turns activity    Assist        Assist Level: Dependent - Patient 0%   Wheelchair 150 feet activity     Assist          Medical Problem List and Plan: 1.  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 2.  DVT Prophylaxis/Anticoagulation: Pharmaceutical: Other (comment)--Eliquis 3. Pain Management: tylenol prn.  4. Mood: LCSW to follow for evaluation and support.  5. Neuropsych: This patient is not capable of making decisions on his own behalf. 6. Skin/Wound Care: Venous pressure relief measures.  Moisturization as needed recurrent seborrhea 7. Fluids/Electrolytes/Nutrition:     Prealbumin increased to 21   -continue to encourage PO  -follow up labs pending 8. CAD with ICM/CHF/ICD: Monitor for symptoms with activity.  Monitor for signs of overload and check weights daily.  Continue aspirin and metoprolol  Filed Weights   05/31/18 0500 06/01/18 0716 06/02/18 0428  Weight: 70.2 kg 70.2 kg 67.5 kg     -weights stable 11/16 9. A fib with RVR: Monitor heart rate twice daily.  Continue metoprolol twice daily. 10.  Urinary retention:  .     -Urine + for 100k E coli: amoxicillin course completed on 11/4  -continue flomax and proscar  -recheck ucx negative 11.  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  Bouts of agitation/sundowning:  Has been struggling at night over last few days.     -environmental mods  -continue trazodone 50mg  at 2100  -consider scheduling low dose seroquel  -continue sleep chart.   13.  History of Barrett's esophagus with dysphagia: Continue PPI 14.  Seizures?/ At risk for seizures: On Vimpat twice daily  -see above 15. OSA: CPAP 16.  Acute blood loss anemia  Hemoglobin 11.0 on 11/12  Continue to monitor    LOS: 18 days A FACE TO FACE EVALUATION WAS PERFORMED  Meredith Staggers 06/02/2018, 8:23 AM

## 2018-06-02 NOTE — Progress Notes (Addendum)
Speech Language Pathology Daily Session Note  Patient Details  Name: [name] MRN: [id number] Date of Birth: June 03, 1941  Today's Date: 06/02/2018 SLP Individual Time: 0915-1000 SLP Individual Time Calculation (min): 45 min  Short Term Goals: Week 3: SLP Short Term Goal 1 (Week 3): Patient will identify functional items from a field of 2 with 50% accuracy and Max A multimodal cues. SLP Short Term Goal 2 (Week 3): Patient will follow basic commands within functional tasks with 75% accuracy and Min A multimodal cues. SLP Short Term Goal 3 (Week 3): Patient will answer basic yes/no questions with 50% accuracy and Mod A verbal cues.  SLP Short Term Goal 4 (Week 3): Patient will verbalize basic wants/needs at word/phrase level with Mod A multimodal cues.  SLP Short Term Goal 5 (Week 3): Patient will demonstrate sustained attention to functional task for 10 minutes with Max A multimodal cues.  SLP Short Term Goal 6 (Week 3): Patient will demonstrate functional problem solving for basic and familiar tasks with Mod A verbal cues.   Skilled Therapeutic Interventions:     Skilled treatment session focused on cognition goals. SLP facilitated session by providing Max A to self-fed breakfast. SLP provided set-up. Pt perseverative on stirring food and rearranging items on tray. SLP facilitated consumption by providing hand over hand assistance and continued redirection to task (pt wanted to have SLP eat part of meal "try that one right there"). After meal, SLP further facilitated session by providing Max A to demonstrate brief periods of sustained attention (< 1 minute) to stringing bead on thread. Pt was returned to room, left upright in wheelchair with lap belt alarm on and granddaughter present. Continue per current plan of care.   SLP made aware that pt is frequently pocketing whole pills and spits them out later. Request made to have medicine crushed in puree. Order given to promote increase  consumption of medicine d/t cognitive deficits.   Pain Pain Assessment Pain Scale: Faces Faces Pain Scale: No hurt  Therapy/Group: Individual Therapy  Lakeria Starkman 06/02/2018, 10:42 AM

## 2018-06-02 NOTE — Progress Notes (Signed)
Nutrition Follow-up  DOCUMENTATION CODES:   Severe malnutrition in context of chronic illness  INTERVENTION:    Continue Ensure Enlive po BID, each supplement provides 350 kcal and 20 grams of protein  Add Magic cup BID mixed with Ensure, each supplement provides 290 kcal and 9 grams of protein  Continue MVI daily  NUTRITION DIAGNOSIS:   Severe Malnutrition related to chronic illness(CHF, TBI) as evidenced by severe fat depletion, severe muscle depletion.  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 He had a prolonged hospitalization and required PEG tube for nutritional support. Pt transferred to [facility] on 04/03/18. Pt was found down on 04/23/18 after being unresponsive for a prolonged period of time. He was transferred to [facility] and Code Stroke initiated for workp. CT head was negative for acute changes but showed left temporal and right frontal encephalomalacia due to hemorrhagic contusions. CT also showed evidence of ventriculomegaly with question of normal pressure hydrocephalus. Pt admitted to CIR with functional deficits secondary to bifrontal and bitemporal intracranial hemorrhage resulting in encephalomalacia.   11/4 - PEG tube removed 11/18- diet advanced to DYS 3  Spoke with daughter at beside. States pt is eating very well and has a great appetite. Meal completions charted as 50-100% for his last 8 meals. He is taking Ensure BID. Daughter states pt loves magic cups and likes to mix it in Ensure like a milkshake. RD to order.    Daughter states they had trouble with pt pocketing pills in his mouth and spiting them down the drain. All pills should now be crushed per MD order.   Weight noted to decrease from 150 lb on 11/11 to 148 lb today. Will continue to monitor.   Medications reviewed and include: Vit D, MVI with minerals, colace, miralax Labs reviewed.   Diet Order:   Diet Order            DIET DYS 3 Room service appropriate? Yes; Fluid consistency: Thin  Diet effective now              EDUCATION NEEDS:   No education needs have been identified at this time  Skin:  Skin Assessment: Skin Integrity Issues: Skin Integrity Issues:: Other (Comment) Other: MASD to coccyx  Last BM:  06/01/18  Height:   Ht Readings from Last 1 Encounters:  05/15/18 6' (1.829 m)    Weight:   Wt Readings from Last 1 Encounters:  06/02/18 67.5 kg    Ideal Body Weight:  80.9 kg  BMI:  Body mass index is 20.18 kg/m.  Estimated Nutritional Needs:   Kcal:  2100-2300  Protein:  100-115 grams  Fluid:  > 2.1 L   Mariana Single RD, LDN Clinical Nutrition Pager # [phone]

## 2018-06-02 NOTE — Progress Notes (Signed)
Discussed patient's episode this weekend with granddaughter as well as negative EEG. Nurse does report that patient occasionally spits out medications and this may have contributed to lowering eizure. He has also recent sleep wake disruption --poor sleep hygiene. She is agreeable to addition of low dose Seroquel at bedtime.

## 2018-06-03 ENCOUNTER — Inpatient Hospital Stay (HOSPITAL_COMMUNITY): Payer: Medicare Other | Admitting: Physical Therapy

## 2018-06-03 ENCOUNTER — Telehealth: Payer: Self-pay

## 2018-06-03 ENCOUNTER — Inpatient Hospital Stay (HOSPITAL_COMMUNITY): Payer: Medicare Other

## 2018-06-03 ENCOUNTER — Inpatient Hospital Stay (HOSPITAL_COMMUNITY): Payer: Medicare Other | Admitting: Speech Pathology

## 2018-06-03 NOTE — Progress Notes (Signed)
[place] PHYSICAL MEDICINE & REHABILITATION PROGRESS NOTE   Subjective/Complaints: Pt had a better night. Only was up to urinate. Awake and in good spirits when I entered today  ROS: Limited due to cognitive/behavioral   Objective:   No results found. Recent Labs    06/02/18 1039  WBC 4.2  HGB 12.1*  HCT 39.3  PLT 222   Recent Labs    06/02/18 1039  NA 140  K 3.7  CL 105  CO2 29  GLUCOSE 131*  BUN 16  CREATININE 0.83  CALCIUM 9.1    Intake/Output Summary (Last 24 hours) at 06/03/2018 0829 Last data filed at 06/02/2018 1859 Gross per 24 hour  Intake 840 ml  Output -  Net 840 ml     Physical Exam: Vital Signs Blood pressure 126/74, pulse 67, temperature 97.9 F (36.6 C), resp. rate 18, height 6' (1.829 m), weight 69.5 kg, SpO2 100 %. Constitutional: No distress . Vital signs reviewed. HEENT: EOMI, oral membranes moist Neck: supple Cardiovascular: RRR without murmur. No JVD    Respiratory: CTA Bilaterally without wheezes or rales. Normal effort    GI: BS +, non-tender, non-distended  Musc: No edema or tenderness in extremities. Neurological: alert and moving all 4's. Attentive, aphasic   Skin: He is not diaphoretic.  Psych: pleasant  Assessment/Plan: 1. Functional deficits secondary to TBI which require 3+ hours per day of interdisciplinary therapy in a comprehensive inpatient rehab setting.  Physiatrist is providing close team supervision and 24 hour management of active medical problems listed below.  Physiatrist and rehab team continue to assess barriers to discharge/monitor patient progress toward functional and medical goals  Care Tool:  Bathing  Bathing activity did not occur: Refused Body parts bathed by patient: Chest, Abdomen, Right arm, Left arm, Front perineal area, Buttocks, Right upper leg, Left upper leg, Face, Right lower leg, Left lower leg   Body parts bathed by helper: Buttocks, Front perineal area, Chest, Right arm, Left arm      Bathing assist Assist Level: Contact Guard/Touching assist     Upper Body Dressing/Undressing Upper body dressing   What is the patient wearing?: Pull over shirt    Upper body assist Assist Level: Supervision/Verbal cueing    Lower Body Dressing/Undressing Lower body dressing      What is the patient wearing?: Underwear/pull up, Pants     Lower body assist Assist for lower body dressing: Supervision/Verbal cueing     Toileting Toileting    Toileting assist Assist for toileting: Moderate Assistance - Patient 50 - 74%     Transfers Chair/bed transfer  Transfers assist     Chair/bed transfer assist level: Moderate Assistance - Patient 50 - 74%     Locomotion Ambulation   Ambulation assist      Assist level: Minimal Assistance - Patient > 75% Assistive device: Hand held assist Max distance: 150   Walk 10 feet activity   Assist     Assist level: Minimal Assistance - Patient > 75% Assistive device: Hand held assist   Walk 50 feet activity   Assist Walk 50 feet with 2 turns activity did not occur: Safety/medical concerns  Assist level: Minimal Assistance - Patient > 75% Assistive device: Hand held assist    Walk 150 feet activity   Assist Walk 150 feet activity did not occur: Safety/medical concerns  Assist level: Minimal Assistance - Patient > 75% Assistive device: Hand held assist    Walk 10 feet on uneven surface  activity  Assist Walk 10 feet on uneven surfaces activity did not occur: Safety/medical concerns         Wheelchair     Assist Will patient use wheelchair at discharge?: Yes Type of Wheelchair: Manual    Wheelchair assist level: Dependent - Patient 0%      Wheelchair 50 feet with 2 turns activity    Assist        Assist Level: Dependent - Patient 0%   Wheelchair 150 feet activity     Assist          Medical Problem List and Plan: 1.  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 2.  DVT Prophylaxis/Anticoagulation: Pharmaceutical: Other (comment)--Eliquis 3. Pain Management: tylenol prn.  4. Mood: LCSW to follow for evaluation and support.  5. Neuropsych: This patient is not capable of making decisions on his own behalf. 6. Skin/Wound Care: Venous pressure relief measures.  Moisturization as needed recurrent seborrhea 7. Fluids/Electrolytes/Nutrition:     Prealbumin increased to 21   -continue to encourage PO  -I personally reviewed the patient's labs today.   8. CAD with ICM/CHF/ICD: Monitor for symptoms with activity.  Monitor for signs of overload and check weights daily.  Continue aspirin and metoprolol  Filed Weights   06/01/18 0716 06/02/18 0428 06/03/18 0413  Weight: 70.2 kg 67.5 kg 69.5 kg     -weights stable 11/19 9. A fib with RVR: Monitor heart rate twice daily.  Continue metoprolol twice daily. 10.  Urinary retention:  .     -Urine + for 100k E coli: amoxicillin course completed on 11/4  -continue flomax and proscar  -f/u ucx negative 11.  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  Bouts of agitation/sundowning:  Has been struggling at night over last few days.     -environmental mods  -continue trazodone 50mg  at 2100  -seroquel in late afternoon and again at night for sundowning/sleep  -continue sleep chart.   13.  History of Barrett's esophagus with dysphagia: Continue PPI 14.  Seizures?/ At risk for seizures: On Vimpat twice daily  -see above, need to make sure he's taking meds in total 15. OSA: CPAP 16.  Acute blood loss anemia  Hemoglobin 12.1 11/18  Continue to monitor    LOS: 19 days A FACE TO [place] 06/03/2018, 8:29 AM

## 2018-06-03 NOTE — Progress Notes (Signed)
Speech Language Pathology Daily Session Note  Patient Details  Name: [name] MRN: [id number] Date of Birth: Jan 09, 1941  Today's Date: 06/03/2018 SLP Individual Time: 1300-1400 SLP Individual Time Calculation (min): 60 min  Short Term Goals: Week 3: SLP Short Term Goal 1 (Week 3): Patient will identify functional items from a field of 2 with 50% accuracy and Max A multimodal cues. SLP Short Term Goal 2 (Week 3): Patient will follow basic commands within functional tasks with 75% accuracy and Min A multimodal cues. SLP Short Term Goal 3 (Week 3): Patient will answer basic yes/no questions with 50% accuracy and Mod A verbal cues.  SLP Short Term Goal 4 (Week 3): Patient will verbalize basic wants/needs at word/phrase level with Mod A multimodal cues.  SLP Short Term Goal 5 (Week 3): Patient will demonstrate sustained attention to functional task for 10 minutes with Max A multimodal cues.  SLP Short Term Goal 6 (Week 3): Patient will demonstrate functional problem solving for basic and familiar tasks with Mod A verbal cues.   Skilled Therapeutic Interventions: Skilled treatment session focused on cognitive-linguistic goals. SLP facilitated session by providing Mod A verbal cues for problem solving during basic self-care tasks of voiding in the commode and washing his hands at the sink. SLP also facilitated session by providing overall Mod A multimodal cues to alternate between CV words "me" and "my" with Total A verbal cues needed to self-monitor and correct errors. Patient also able to verbalize CV word "knee" but unable to verbalize "no" on command despite Max A multimodal cues. Patient continues to demonstrate neologisms at the phrase level with intermittent islands of intelligible speech. Patient left upright in wheelchair with alarm on and fiance present. Continue with current plan of care.       Pain No/Denies Pain   Therapy/Group: Individual Therapy  [name] 06/03/2018,  2:08 PM

## 2018-06-03 NOTE — Progress Notes (Signed)
ICD on [facility] [address] Marya Amsler [place] [phone], was last checked at [facility] in August 2019 per family.

## 2018-06-03 NOTE — Progress Notes (Signed)
Occupational Therapy Session Note  Patient Details  Name: [name] MRN: [id number] Date of Birth: Feb 22, 1941  Today's Date: 06/03/2018 OT Individual Time: 1100-1200 OT Individual Time Calculation (min): 60 min    Short Term Goals: Week 3:  OT Short Term Goal 1 (Week 3): STG=LTG d/t ELOS  Skilled Therapeutic Interventions/Progress Updates:    Session focused on b/d tasks at shower level and self feeding. Pt completed functional mobility into bathroom with min HHA. Pt required moderate cueing to transition into shower. He completed bathing standing in shower with CGA. Pt completed UB dressing with no cueing or physical assist! Min cueing and CGA for donning pants sit <> stand. Pt then sat in w/c and ate lunch. Distractions were minimized and gradually added back to challenge sustained attention. Pt required min cueing throughout to initiate feeding. Min HOH provided to regulate bite size. Pt left supine in bed with all needs met and fiance/telesitter present.   Therapy Documentation Precautions:  Precautions Precautions: Fall Precaution Comments: Impulsive Restrictions Weight Bearing Restrictions: No    Pain:   No pain reported throughout session.   Therapy/Group: Individual Therapy  Curtis Sites 06/03/2018, 8:34 AM

## 2018-06-03 NOTE — Progress Notes (Addendum)
Physical Therapy Session Note  Patient Details  Name: [name] MRN: [id number] Date of Birth: Sep 25, 1940  Today's Date: 06/03/2018 PT Individual Time: 7473-4037 and 1400-1426 PT Individual Time Calculation (min): 54 min and 26 min  Short Term Goals: Week 3:  PT Short Term Goal 1 (Week 3): = LTG  Skilled Therapeutic Interventions/Progress Updates:    pt performs gait throughout unit with HHA with close supervision/min A.  Pt performs gait ladder to improve step and stride length and is able to carry over x 150' with min cuing.  Pt performs gait with ball toss with min A for posterior lean, mod cues for divided attention.  Pt performs furniture transfers with min A in ADL apartment.  Seated UE therex with pt counting out loud to 10 with min/mod multimodal cues.  Seated LE stretching with multimodal cues to follow PT instructions, pt able to stretch hips and hamstrings and calves.  Sitting balance with sitting on theraball with wt shifts and circles with mod A.  Pt performs nustep x 8 minutes for UE/LE strength and endurance. Pt left in room with RN present.  Session 2: pt with no c/o pain.  Gait throughout unit with family providing HHA, cues to increase step length and reduce toe drag, pt able to improve with cuing.  standing therex with 2# wts on bilat LEs with pt requiring mod/max multimodal cues to follow exercises, increased cuing needed when fatigued. Pt left in bed with family present, alarm set, needs at hand.  Therapy Documentation Precautions:  Precautions Precautions: Fall Precaution Comments: Impulsive Restrictions Weight Bearing Restrictions: No Pain:  no c/o pain   Therapy/Group: Individual Therapy  Shelbi Vaccaro 06/03/2018, 10:53 AM

## 2018-06-03 NOTE — Telephone Encounter (Signed)
Pt wife called stating that the pt is in the hospital rehab for a brain injury. She cancelled his home remote appointment. I stated to her to have the patient send a transmission as soon as he get home.

## 2018-06-03 NOTE — NC FL2 (Signed)
[place] LEVEL OF CARE SCREENING TOOL     IDENTIFICATION  Patient Name: [name] Birthdate: Jul 01, 1941 Sex: male Admission Date (Current Location): 05/15/2018  [facility] and [place] Number:  Herbalist and Address:  The [place], [place] [address] [place],  [address]      Provider Number: [id number]  Attending Physician Name and Address:  Meredith Staggers, MD  Relative Name and Phone Number:       Current Level of Care: Other (Comment)(Acute Inpatient Rehab) Recommended Level of Care: [place] Prior Approval Number:    Date Approved/Denied:   PASRR Number: [id number]  Discharge Plan: SNF    Current Diagnoses: Patient Active Problem List   Diagnosis Date Noted  . Seizures ([place])   . Acute blood loss anemia   . Urinary retention   . Chronic congestive heart failure ([place])   . TBI (traumatic brain injury) ([place]) 05/15/2018  . ICD (implantable cardioverter-defibrillator) in place 05/21/2016  . 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 Claudication of right lower extremity ([place]) 10/04/2015  . Preop cardiovascular exam 11/16/2014  . Palpitations 08/13/2013  . Chronic atrial fibrillation ([place]) 03/26/2013  . Hyperlipidemia 03/26/2013  . Barrett's esophagus 03/26/2013  . Obstructive sleep apnea 03/26/2013  . Cardiomyopathy, ischemic 12/19/2012  . HTN (hypertension) 12/19/2012  . Hx pulmonary embolism 12/19/2012  . NSVT (nonsustained ventricular tachycardia) ([place]) 10/23/2012  . CAD S/P multiple PCIs 10/23/2012  . PSVT (paroxysmal supraventricular tachycardia) ([place]) 09/30/2012  . Long term (current) use of anticoagulants 09/30/2012    Orientation RESPIRATION BLADDER Height & Weight     Self  Normal Continent, Incontinent(I/O caths prn ) Weight: 69.5 kg Height:  6' (182.9 cm)  BEHAVIORAL SYMPTOMS/MOOD NEUROLOGICAL BOWEL NUTRITION STATUS      Continent, Incontinent Diet(Dys 3, thin)  AMBULATORY  STATUS COMMUNICATION OF NEEDS Skin   Limited Assist Non-Verbally Other (Comment)(MASD to sacrum)                       Personal Care Assistance Level of Assistance  Bathing, Dressing Bathing Assistance: Limited assistance   Dressing Assistance: Limited assistance     Functional Limitations Info  Speech     Speech Info: Impaired(expressive aphasiz)    SPECIAL CARE FACTORS FREQUENCY  PT (By licensed PT), OT (By licensed OT), Speech therapy     PT Frequency: 5x/wk OT Frequency: 5x/wk     Speech Therapy Frequency: 5x/wk      Contractures Contractures Info: Not present    Additional Factors Info  Psychotropic, Code Status, Allergies Code Status Info: Full Allergies Info: NKDA           Current Medications (06/03/2018):  This is the current hospital active medication list Current Facility-Administered Medications  Medication Dose Route Frequency Provider Last Rate Last Dose  . acetaminophen (TYLENOL) tablet 325-650 mg  325-650 mg Oral Q4H PRN Bary Leriche, PA-C   650 mg at 06/03/18 0855  . alum & mag hydroxide-simeth (MAALOX/MYLANTA) 200-200-20 MG/5ML suspension 30 mL  30 mL Oral Q4H PRN Love, Pamela S, PA-C      . apixaban (ELIQUIS) tablet 5 mg  5 mg Oral BID Love, Pamela S, PA-C   5 mg at 06/03/18 1045  . aspirin EC tablet 81 mg  81 mg Oral Daily Bary Leriche, PA-C   81 mg at 06/03/18 0856  . bisacodyl (DULCOLAX) suppository 10 mg  10 mg Rectal Daily PRN Reesa Chew  S, PA-C   10 mg at 05/27/18 1603  . calcium carbonate (TUMS - dosed in mg elemental calcium) chewable tablet 200 mg of elemental calcium  1 tablet Oral Daily Love, Ivan Anchors, PA-C   200 mg of elemental calcium at 06/03/18 0854  . cholecalciferol (VITAMIN D) tablet 1,000 Units  1,000 Units Oral Daily Bary Leriche, PA-C   1,000 Units at 06/03/18 0856  . diphenhydrAMINE (BENADRYL) 12.5 MG/5ML elixir 12.5-25 mg  12.5-25 mg Oral Q6H PRN Bary Leriche, PA-C   25 mg at 05/17/18 0407  . docusate sodium  (COLACE) capsule 100 mg  100 mg Oral Daily Bary Leriche, PA-C   100 mg at 06/03/18 0855  . feeding supplement (ENSURE ENLIVE) (ENSURE ENLIVE) liquid 237 mL  237 mL Oral BID BM Meredith Staggers, MD   237 mL at 06/03/18 1306  . finasteride (PROSCAR) tablet 5 mg  5 mg Oral QHS Love, Pamela S, PA-C   5 mg at 06/02/18 2217  . guaiFENesin-dextromethorphan (ROBITUSSIN DM) 100-10 MG/5ML syrup 5-10 mL  5-10 mL Oral Q6H PRN Love, Pamela S, PA-C      . lacosamide (VIMPAT) tablet 200 mg  200 mg Oral BID Love, Pamela S, PA-C   200 mg at 06/03/18 1045  . Melatonin TABS 6 mg  6 mg Oral QHS Meredith Staggers, MD   6 mg at 06/02/18 2216  . metoprolol tartrate (LOPRESSOR) tablet 37.5 mg  37.5 mg Oral BID Reesa Chew S, PA-C   37.5 mg at 06/03/18 1045  . multivitamin with minerals tablet 1 tablet  1 tablet Oral Daily Meredith Staggers, MD   1 tablet at 06/03/18 0855  . pantoprazole (PROTONIX) EC tablet 40 mg  40 mg Oral Daily Bary Leriche, PA-C   40 mg at 06/03/18 0855  . polyethylene glycol (MIRALAX / GLYCOLAX) packet 17 g  17 g Oral Daily Bary Leriche, PA-C   17 g at 06/03/18 0854  . polyethylene glycol (MIRALAX / GLYCOLAX) packet 17 g  17 g Oral Daily PRN Love, Pamela S, PA-C      . prochlorperazine (COMPAZINE) tablet 5-10 mg  5-10 mg Oral Q6H PRN Love, Pamela S, PA-C       Or  . prochlorperazine (COMPAZINE) injection 5-10 mg  5-10 mg Intramuscular Q6H PRN Love, Pamela S, PA-C       Or  . prochlorperazine (COMPAZINE) suppository 12.5 mg  12.5 mg Rectal Q6H PRN Love, Pamela S, PA-C      . QUEtiapine (SEROQUEL) tablet 12.5 mg  12.5 mg Oral BID PRN Meredith Staggers, MD   12.5 mg at 06/02/18 0409  . QUEtiapine (SEROQUEL) tablet 12.5 mg  12.5 mg Oral BID Bary Leriche, PA-C   12.5 mg at 06/02/18 2217  . senna (SENOKOT) tablet 17.2 mg  2 tablet Oral QHS Bary Leriche, PA-C   17.2 mg at 06/02/18 2216  . sodium phosphate (FLEET) 7-19 GM/118ML enema 1 enema  1 enema Rectal Once PRN Love, Ivan Anchors, PA-C      .  tamsulosin (FLOMAX) capsule 0.4 mg  0.4 mg Oral QPC supper Meredith Staggers, MD   0.4 mg at 06/02/18 1827  . traZODone (DESYREL) tablet 50 mg  50 mg Oral QHS Love, Pamela S, PA-C   50 mg at 06/02/18 2217     Discharge Medications: Please see discharge summary for a list of discharge medications.  Relevant Imaging Results:  Relevant Lab Results:  Additional Information SS# [id number]  Lennart Pall, LCSW

## 2018-06-04 ENCOUNTER — Inpatient Hospital Stay (HOSPITAL_COMMUNITY): Payer: Medicare Other | Admitting: Occupational Therapy

## 2018-06-04 ENCOUNTER — Inpatient Hospital Stay (HOSPITAL_COMMUNITY): Payer: Medicare Other | Admitting: Speech Pathology

## 2018-06-04 ENCOUNTER — Inpatient Hospital Stay (HOSPITAL_COMMUNITY): Payer: Medicare Other

## 2018-06-04 NOTE — Progress Notes (Signed)
[place] PHYSICAL MEDICINE & REHABILITATION PROGRESS NOTE   Subjective/Complaints: Had another good night. In good spirits this morning. Sitter at bedside  ROS: Limited due to cognitive/behavioral    Objective:   No results found. Recent Labs    06/02/18 1039  WBC 4.2  HGB 12.1*  HCT 39.3  PLT 222   Recent Labs    06/02/18 1039  NA 140  K 3.7  CL 105  CO2 29  GLUCOSE 131*  BUN 16  CREATININE 0.83  CALCIUM 9.1    Intake/Output Summary (Last 24 hours) at 06/04/2018 0744 Last data filed at 06/03/2018 1847 Gross per 24 hour  Intake 360 ml  Output -  Net 360 ml     Physical Exam: Vital Signs Blood pressure 117/67, pulse 74, temperature 98.9 F (37.2 C), temperature source Oral, resp. rate 18, height 6' (1.829 m), weight 70.1 kg, SpO2 98 %. Constitutional: No distress . Vital signs reviewed. HEENT: EOMI, oral membranes moist Neck: supple Cardiovascular: RRR without murmur. No JVD    Respiratory: CTA Bilaterally without wheezes or rales. Normal effort    GI: BS +, non-tender, non-distended  Musc: No edema or tenderness in extremities. Neurological: alert and moving all 4's. Attentive, aphasic, better with automatic answers   Skin: He is not diaphoretic.  Psych: pleasant  Assessment/Plan: 1. Functional deficits secondary to TBI which require 3+ hours per day of interdisciplinary therapy in a comprehensive inpatient rehab setting.  Physiatrist is providing close team supervision and 24 hour management of active medical problems listed below.  Physiatrist and rehab team continue to assess barriers to discharge/monitor patient progress toward functional and medical goals  Care Tool:  Bathing  Bathing activity did not occur: Refused Body parts bathed by patient: Chest, Abdomen, Right arm, Left arm, Front perineal area, Buttocks, Right upper leg, Left upper leg, Face, Right lower leg, Left lower leg   Body parts bathed by helper: Buttocks, Front perineal  area, Chest, Right arm, Left arm     Bathing assist Assist Level: Contact Guard/Touching assist     Upper Body Dressing/Undressing Upper body dressing   What is the patient wearing?: Pull over shirt    Upper body assist Assist Level: Supervision/Verbal cueing    Lower Body Dressing/Undressing Lower body dressing      What is the patient wearing?: Underwear/pull up, Pants     Lower body assist Assist for lower body dressing: Contact Guard/Touching assist     Toileting Toileting    Toileting assist Assist for toileting: Moderate Assistance - Patient 50 - 74%     Transfers Chair/bed transfer  Transfers assist     Chair/bed transfer assist level: Contact Guard/Touching assist     Locomotion Ambulation   Ambulation assist      Assist level: Minimal Assistance - Patient > 75% Assistive device: Hand held assist Max distance: 150   Walk 10 feet activity   Assist     Assist level: Minimal Assistance - Patient > 75% Assistive device: Hand held assist   Walk 50 feet activity   Assist Walk 50 feet with 2 turns activity did not occur: Safety/medical concerns  Assist level: Minimal Assistance - Patient > 75% Assistive device: Hand held assist    Walk 150 feet activity   Assist Walk 150 feet activity did not occur: Safety/medical concerns  Assist level: Minimal Assistance - Patient > 75% Assistive device: Hand held assist    Walk 10 feet on uneven surface  activity   Assist  Walk 10 feet on uneven surfaces activity did not occur: Safety/medical concerns         Wheelchair     Assist Will patient use wheelchair at discharge?: Yes Type of Wheelchair: Manual    Wheelchair assist level: Dependent - Patient 0%      Wheelchair 50 feet with 2 turns activity    Assist        Assist Level: Dependent - Patient 0%   Wheelchair 150 feet activity     Assist          Medical Problem List and Plan: 1.  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  DVT Prophylaxis/Anticoagulation: Pharmaceutical: Other (comment)--Eliquis 3. Pain Management: tylenol prn.  4. Mood: LCSW to follow for evaluation and support.  5. Neuropsych: This patient is not capable of making decisions on his own behalf. 6. Skin/Wound Care: Venous pressure relief measures.  Moisturization as needed recurrent seborrhea 7. Fluids/Electrolytes/Nutrition:     Prealbumin increased to 35most recently  -continue to encourage PO   .   8. CAD with ICM/CHF/ICD: Monitor for symptoms with activity.  Monitor for signs of overload and check weights daily.  Continue aspirin and metoprolol  Filed Weights   06/03/18 0413 06/04/18 0327 06/04/18 0723  Weight: 69.5 kg 70 kg 70.1 kg     -weights stable 11/20 9. A fib with RVR: Monitor heart rate twice daily.  Continue metoprolol twice daily. 10.  Urinary retention:  .     -Urine + for 100k E coli: amoxicillin course completed on 11/4  -continue flomax and proscar  -f/u ucx negative 11.  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  Bouts of agitation/sundowning:     -sleep/behavior better over last 2 nights     -environmental mods  -continue trazodone 50mg  at 2100  -seroquel in late afternoon and again at night for sundowning/sleep  -continue sleep chart.   13.  History of Barrett's esophagus with dysphagia: Continue PPI 14.  Seizures?/ At risk for seizures: On Vimpat twice daily  -see above, need to make sure he's taking meds in total 15. OSA: CPAP 16.  Acute blood loss anemia  Hemoglobin 12.1 11/18  Continue to monitor    LOS: 20 days A FACE TO FACE EVALUATION WAS PERFORMED  Meredith Staggers 06/04/2018, 7:44 AM

## 2018-06-04 NOTE — Progress Notes (Signed)
Occupational Therapy Session Note  Patient Details  Name: [name] MRN: [id number] Date of Birth: [birth year]  Today's Date: 06/04/2018 OT Individual Time: 1330-1400 OT Individual Time Calculation (min): 30 min    Short Term Goals: Week 2:  OT Short Term Goal 1 (Week 2): t will appropriately use toothbrush/comn wiht min VC OT Short Term Goal 1 - Progress (Week 2): Partly met OT Short Term Goal 2 (Week 2): Pt will sequence bathing body parts with min VC OT Short Term Goal 2 - Progress (Week 2): Met OT Short Term Goal 3 (Week 2): Pt will initate doffing clothing prior to bathing wiht min VC OT Short Term Goal 3 - Progress (Week 2): Met OT Short Term Goal 4 (Week 2): Pt will transfer to toilet consistently wiht min A and LRAD OT Short Term Goal 4 - Progress (Week 2): Met  Skilled Therapeutic Interventions/Progress Updates:    Patient seated on the bed with wife at start of session.  He is pleasant and cooperative, does not appear to be in any pain or distress.  Appropriate verbal responses at times with social conversation.  Continues with significant communication deficit.   He ambulates in room and to/from therapy gym with CS/CG no AD.  Completed basketball activity with no LOB - some humor when tossing the ball to his wife.  Completed bimanual maze activity in stance without difficulty.  Returned to room with wife present to continue supervision.  Wife expressed her happiness that his balance and automatic responses are improving.  Therapy Documentation Precautions:  Precautions Precautions: Fall Precaution Comments: Impulsive Restrictions Weight Bearing Restrictions: No General:   Vital Signs: Therapy Vitals Temp: 98 F (36.7 C) Pulse Rate: (!) 58 Resp: 18 BP: 104/70 Patient Position (if appropriate): Lying Oxygen Therapy SpO2: 96 % O2 Device: Room Air Pain: Pain Assessment Pain Scale: 0-10 Pain Score: 0-No pain   Therapy/Group: Individual Therapy  Carlos Levering 06/04/2018, 3:50 PM

## 2018-06-04 NOTE — Progress Notes (Signed)
Speech Language Pathology Daily Session Note  Patient Details  Name: [name] MRN: [id number] Date of Birth: 1941-06-02  Today's Date: 06/04/2018 SLP Individual Time: 0930-1025 SLP Individual Time Calculation (min): 55 min  Short Term Goals: Week 3: SLP Short Term Goal 1 (Week 3): Patient will identify functional items from a field of 2 with 50% accuracy and Max A multimodal cues. SLP Short Term Goal 2 (Week 3): Patient will follow basic commands within functional tasks with 75% accuracy and Min A multimodal cues. SLP Short Term Goal 3 (Week 3): Patient will answer basic yes/no questions with 50% accuracy and Mod A verbal cues.  SLP Short Term Goal 4 (Week 3): Patient will verbalize basic wants/needs at word/phrase level with Mod A multimodal cues.  SLP Short Term Goal 5 (Week 3): Patient will demonstrate sustained attention to functional task for 10 minutes with Max A multimodal cues.  SLP Short Term Goal 6 (Week 3): Patient will demonstrate functional problem solving for basic and familiar tasks with Mod A verbal cues.   Skilled Therapeutic Interventions: Skilled treatment session focused on cognitive-linguistic goals. SLP facilitated session by providing overall Min A visual and phonemic cues for alternating between CV phonemes "me," "my," "no," and "knee" with decreased verbal perseveration noted. Patient also appeared to demonstrate increased awareness of errors with Max A multimodal cues to self-monitor and correct. Patient attended to task for ~30 minutes with overall Min-Mod A verbal cues but then stop verbalizing for ~10 minutes with only using gestures. However, when new task introduced that focused on counting money, patient utilized verbalizations again. Max-Total A multimodal cues needed for problem solving with counting money. Patient left upright in wheelchair with fiance present. Continue with current plan of care.      Pain Pain Assessment Pain Scale: 0-10 Pain Score:  0-No pain  Therapy/Group: Individual Therapy  Dorann Davidson 06/04/2018, 12:41 PM

## 2018-06-04 NOTE — Progress Notes (Signed)
Physical Therapy Session Note  Patient Details  Name: [name] MRN: [id number] Date of Birth: [birth year]  Today's Date: 06/04/2018 PT Individual Time: 1103-1200 PT Individual Time Calculation (min): 57 min   Short Term Goals: Week 3:  PT Short Term Goal 1 (Week 3): = LTG     Skilled Therapeutic Interventions/Progress Updates:  Pt resting in bed, [name] in attendance.  Bed mobility with supervision.  neuromuscular re-education seated in w/c, via forced use and multimodal cues for bil LE alternating reciprocal movement x 25 cycles 3 focusing on gluteal muscles.    Gait training CGA>superivison over level tile x 100' without LOB.  Up/down 12 steps 2 rails with min assist, max cues for putting R foot fully on tread. No buckling of knees.  Therapeutic activities promoting R hand use: in standing, hanging ankle weights on overhead hooks with hand over hand assist; in biased -to-R standing while assembling and dissembling a small PVC puzzle via diagram with max cues (and choices of 2 for each piece); in quadruped to wipe down therapy mat while wearing gloves.  Max cues for attention to right.  Knee walking R>< L with manual cues, no LOB.  Pt left resting in w/c with [name] pushing him back to room.      Therapy Documentation Precautions:  Precautions Precautions: Fall Precaution Comments: Impulsive Restrictions Weight Bearing Restrictions: No Pain: Pain Assessment Pain Scale: 0-10 Pain Score: 0-No pain     Therapy/Group: Individual Therapy  Ranjit Ashurst 06/04/2018, 12:08 PM

## 2018-06-04 NOTE — Progress Notes (Signed)
Occupational Therapy Session Note  Patient Details  Name: [name] MRN: [id number] Date of Birth: 09-26-1940  Today's Date: 06/04/2018 OT Individual Time: 4098-1191 OT Individual Time Calculation (min): 60 min    Short Term Goals: Week 3:  OT Short Term Goal 1 (Week 3): STG=LTG d/t ELOS  Skilled Therapeutic Interventions/Progress Updates:    Session focused on b/d tasks at shower level and dynamic standing balance for fall prevention. Pt required only 1 vc to complete functional mobility into bathroom for shower. He spontaneously undressed, requiring CGA for standing balance when removing LB clothing in standing. Pt adjusted water with no assist and then completed full body bathing with cueing for hair washing only. Pt completed UB dressing with (S) only and LB dressing with CGA. Pt then completed 150 ft of functional mobility with min HHA. CGA was provided during functional reaching task distally to challenge dynamic standing balance. Pt returned to room and was left sitting up in w/c with fiance present, all needs met.   Therapy Documentation Precautions:  Precautions Precautions: Fall Precaution Comments: Impulsive Restrictions Weight Bearing Restrictions: No    Pain:  No pain reported throughout session.    Therapy/Group: Individual Therapy  Curtis Sites 06/04/2018, 8:09 AM

## 2018-06-05 ENCOUNTER — Inpatient Hospital Stay (HOSPITAL_COMMUNITY): Payer: Medicare Other | Admitting: Speech Pathology

## 2018-06-05 ENCOUNTER — Inpatient Hospital Stay (HOSPITAL_COMMUNITY): Payer: Medicare Other

## 2018-06-05 ENCOUNTER — Inpatient Hospital Stay (HOSPITAL_COMMUNITY): Payer: Medicare Other | Admitting: Physical Therapy

## 2018-06-05 NOTE — Progress Notes (Signed)
Physical Therapy Session Note  Patient Details  Name: [name] MRN: [id number] Date of Birth: 02/25/1941  Today's Date: 06/05/2018 PT Individual Time: 1100-1155 PT Individual Time Calculation (min): 55 min   Short Term Goals: Week 3:  PT Short Term Goal 1 (Week 3): = LTG  Skilled Therapeutic Interventions/Progress Updates:    pt performs gait throughout unit with CGA without AD.  Standing balance with folding towels with pt with 1 LOB requiring mod A to correct.  Seated UE therex with 5# dowel with pt requiring only min multimodal cues to follow exercises.  seated hi p, hamstring and calf stretching with min cues. Standing balance with dynavision with pt initially requiring mod/max A for completing, progresses to min A/supervision.  nustep x 8 minutes level 5 for UE/LE strength and endurance. Pt left in bed with family present, needs at hand.  Therapy Documentation Precautions:  Precautions Precautions: Fall Precaution Comments: Impulsive Restrictions Weight Bearing Restrictions: No Pain:  no c/o pain   Therapy/Group: Individual Therapy  [name] 06/05/2018, 1:07 PM

## 2018-06-05 NOTE — Progress Notes (Signed)
Physical Therapy Session Note  Patient Details  Name: [name] MRN: [id number] Date of Birth: [birth year]  Today's Date: 06/05/2018 PT Individual Time: 6226-3335 PT Individual Time Calculation (min): 45 min   Short Term Goals: Week 3:  PT Short Term Goal 1 (Week 3): = LTG    Skilled Therapeutic Interventions/Progress Updates:   Pt's daughter Suanne Marker in attendance.  She stated that pt had been very "fidgety" today.  Pt was sound asleep and difficult to awaken.  A warm washcloth to his face and stating his name was effective.  Bed mobility with supervision, extra time.    Pt initially seemed to want to use toilet; gait training in room to toilet with min assist for balance; pt had difficulty wt shifting to R.   Once in the BR, pt became resistant and ambulated back out to bed with min assist for balance.  RLE appeared to be tired/painful with ambulation.  Pt eventually agreed again to ambulate, with hand hold assist of [name] and PT, but then resisted when he reached the door of his room. He climbed over the bed rail to get back into bed, with no difficulty, before PT could lower it.  While sitting EOB, pt did participate in tossing a ball with PT and [name].  He was accurate catching 100%.  He tired, and laid back down onto bed.    In side lying R/L, pt performed 10 x 1 and 5 x 1 L/R hip abduction with flexed hips and knees, for neuro re-ed via multimodal cues.  Pt tried repeatedly during session to pull this PT down into bed with him.  Pt left resting in bed with dtr [name] in attendance.      Therapy Documentation Precautions:  Precautions Precautions: Fall Precaution Comments: Impulsive Restrictions Weight Bearing Restrictions: No  Pain: Pain Assessment No S/S of pain at rest, but reluctant to shift wt to R when ambulating   Therapy/Group: Individual Therapy  Jerita Wimbush 06/05/2018, 4:42 PM

## 2018-06-05 NOTE — Progress Notes (Signed)
Occupational Therapy Session Note  Patient Details  Name: [name] MRN: [id number] Date of Birth: 02-Nov-1940  Today's Date: 06/05/2018 OT Individual Time: 1300-1400 OT Individual Time Calculation (min): 60 min  15 min missed d/t fatigue   Short Term Goals: Week 3:  OT Short Term Goal 1 (Week 3): STG=LTG d/t ELOS  Skilled Therapeutic Interventions/Progress Updates:    Session focused on b/d tasks at shower level. Pt completed functional mobility into shower with CGA. Pt difficult to redirect this session, more internally distracted. Pt completed brief shower with CGA for balance support. He donned shirt and pants with (S). Min A provided for donning shoes. Pt completed bed making task- completing functional mobility around bed with intermittent CGA for balance support. Pt bimanually manipulating blankets and sheets in standing. Pt laid down in bed and almost immediately fell asleep. 15 min missed d/t this fatigue. Pt left in bed with daughter present and telesitter on.   Therapy Documentation Precautions:  Precautions Precautions: Fall Precaution Comments: Impulsive Restrictions Weight Bearing Restrictions: No General: General OT Amount of Missed Time: 15 Minutes Vital Signs: Therapy Vitals Temp: 98.1 F (36.7 C) Pulse Rate: 65 Resp: 18 BP: 111/72 Patient Position (if appropriate): Lying Oxygen Therapy SpO2: (!) 77 % O2 Device: Room Air Pain: Pain Assessment Pain Scale: 0-10 Pain Score: 0-No pain   Therapy/Group: Individual Therapy  Curtis Sites 06/05/2018, 3:27 PM

## 2018-06-05 NOTE — Progress Notes (Signed)
Sitter at bedside and tele monitor in place; patient slept most of shift. Woke up mid morning agitated and removed lower clothing and refused to replace them. Comfort measured and several reattempts made to replace lower garments; patient refused; Patients assisted to bathroom and repositioned back in bed; Meds given as ordered this shift. Will continue to monitor

## 2018-06-05 NOTE — Progress Notes (Signed)
Speech Language Pathology Weekly Progress and Session Note  Patient Details  Name: [name] MRN: [id number] Date of Birth: Dec 10, 1940  Beginning of progress report period: May 29, 2018 End of progress report period: June 05, 2018  Today's Date: 06/05/2018 SLP Individual Time: 0930-1030 SLP Individual Time Calculation (min): 60 min  Short Term Goals: Week 3: SLP Short Term Goal 1 (Week 3): Patient will identify functional items from a field of 2 with 50% accuracy and Max A multimodal cues. SLP Short Term Goal 1 - Progress (Week 3): Met SLP Short Term Goal 2 (Week 3): Patient will follow basic commands within functional tasks with 75% accuracy and Min A multimodal cues. SLP Short Term Goal 2 - Progress (Week 3): Met SLP Short Term Goal 3 (Week 3): Patient will answer basic yes/no questions with 50% accuracy and Mod A verbal cues.  SLP Short Term Goal 3 - Progress (Week 3): Met SLP Short Term Goal 4 (Week 3): Patient will verbalize basic wants/needs at word/phrase level with Mod A multimodal cues.  SLP Short Term Goal 4 - Progress (Week 3): Not met SLP Short Term Goal 5 (Week 3): Patient will demonstrate sustained attention to functional task for 10 minutes with Max A multimodal cues.  SLP Short Term Goal 5 - Progress (Week 3): Met SLP Short Term Goal 6 (Week 3): Patient will demonstrate functional problem solving for basic and familiar tasks with Mod A verbal cues.  SLP Short Term Goal 6 - Progress (Week 3): Not met    New Short Term Goals: Week 4: SLP Short Term Goal 1 (Week 4): STGs=LTGs  Weekly Progress Updates: Patient continues to make functional gains and has met 4 of 6 STGs this reporting period. Currently, patient demonstrates increased verbalizations at the phrase level with increased ability to alternate between 4 CV phonemes with structured cues resulting in less perseveration and increased awareness of verbal errors. Patient also demonstrates increased attention  to tasks but continues to require Max verbal cues for problem solving, recall, awareness and safety with intermittent restlessness. Patient and family education ongoing. Patient would benefit from continued skilled SLP intervention to maximize his cognitive-linguistic function prior to discharge.      Intensity: Minumum of 1-2 x/day, 30 to 90 minutes Frequency: 3 to 5 out of 7 days Duration/Length of Stay: TBD due to SNF placement  Treatment/Interventions: Cognitive remediation/compensation;Environmental controls;Multimodal communication approach;Speech/Language Firefighter;Therapeutic Activities;Internal/external aids;Dysphagia/aspiration precaution training;Patient/family education   Daily Session  Skilled Therapeutic Interventions: Skilled treatment session focused on cognitive goals. Upon arrival, patient restless and attempting to ambulate off the unit. Despite Max encouragement and multimodal cues, patient could not be redirected. Therefore, SLP and another staff member ambulated with the patient with Min A. Patient eventually fatigued and sat down in his wheelchair.   However, once clinician attempted to push the patient back to his room, he became resistive by dragging his feet on the floor. Patient reclined back in wheelchair for safety and pushed back to his room. [name] daughter present and patient eventually agreeable to sitting on EOB while consuming a small snack. Patient with intermittent verbalizations that were essentially unintelligible but patient could clearly verbalize "no" and basic wants in regards to going home. Patient left upright with daughter present. Continue with current plan of care.    Pain No/Denies Pain   Therapy/Group: Individual Therapy  Trenton Passow 06/05/2018, 3:20 PM

## 2018-06-05 NOTE — Progress Notes (Signed)
[place] PHYSICAL MEDICINE & REHABILITATION PROGRESS NOTE   Subjective/Complaints: No new complaints. Feeling well. Slept most of night  ROS: Limited due to cognitive/behavioral    Objective:   No results found. Recent Labs    06/02/18 1039  WBC 4.2  HGB 12.1*  HCT 39.3  PLT 222   Recent Labs    06/02/18 1039  NA 140  K 3.7  CL 105  CO2 29  GLUCOSE 131*  BUN 16  CREATININE 0.83  CALCIUM 9.1    Intake/Output Summary (Last 24 hours) at 06/05/2018 0900 Last data filed at 06/05/2018 0753 Gross per 24 hour  Intake 800 ml  Output -  Net 800 ml     Physical Exam: Vital Signs Blood pressure 117/89, pulse 90, temperature 97.9 F (36.6 C), resp. rate 18, height 6' (1.829 m), weight 68.6 kg, SpO2 99 %. Constitutional: No distress . Vital signs reviewed. HEENT: EOMI, oral membranes moist Neck: supple Cardiovascular: RRR without murmur. No JVD    Respiratory: CTA Bilaterally without wheezes or rales. Normal effort    GI: BS +, non-tender, non-distended  Musc: No edema or tenderness in extremities. Neurological: alert and moving all 4's. Attentive, aphasic, improving automatic language  Skin: He is not diaphoretic.  Psych: pleasant  Assessment/Plan: 1. Functional deficits secondary to TBI which require 3+ hours per day of interdisciplinary therapy in a comprehensive inpatient rehab setting.  Physiatrist is providing close team supervision and 24 hour management of active medical problems listed below.  Physiatrist and rehab team continue to assess barriers to discharge/monitor patient progress toward functional and medical goals  Care Tool:  Bathing  Bathing activity did not occur: Refused Body parts bathed by patient: Chest, Abdomen, Right arm, Left arm, Front perineal area, Buttocks, Right upper leg, Left upper leg, Face, Right lower leg, Left lower leg   Body parts bathed by helper: Buttocks, Front perineal area, Chest, Right arm, Left arm     Bathing  assist Assist Level: Contact Guard/Touching assist     Upper Body Dressing/Undressing Upper body dressing   What is the patient wearing?: Pull over shirt    Upper body assist Assist Level: Supervision/Verbal cueing    Lower Body Dressing/Undressing Lower body dressing      What is the patient wearing?: Underwear/pull up, Pants     Lower body assist Assist for lower body dressing: Contact Guard/Touching assist     Toileting Toileting    Toileting assist Assist for toileting: Moderate Assistance - Patient 50 - 74%     Transfers Chair/bed transfer  Transfers assist     Chair/bed transfer assist level: Contact Guard/Touching assist     Locomotion Ambulation   Ambulation assist      Assist level: Contact Guard/Touching assist Assistive device: Hand held assist Max distance: 100   Walk 10 feet activity   Assist     Assist level: Contact Guard/Touching assist Assistive device: Hand held assist   Walk 50 feet activity   Assist Walk 50 feet with 2 turns activity did not occur: Safety/medical concerns  Assist level: Contact Guard/Touching assist Assistive device: Hand held assist    Walk 150 feet activity   Assist Walk 150 feet activity did not occur: Safety/medical concerns  Assist level: Minimal Assistance - Patient > 75% Assistive device: Hand held assist    Walk 10 feet on uneven surface  activity   Assist Walk 10 feet on uneven surfaces activity did not occur: Safety/medical concerns  Wheelchair     Assist Will patient use wheelchair at discharge?: Yes Type of Wheelchair: Manual    Wheelchair assist level: Dependent - Patient 0%      Wheelchair 50 feet with 2 turns activity    Assist        Assist Level: Dependent - Patient 0%   Wheelchair 150 feet activity     Assist          Medical Problem List and Plan: 1.  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  DVT Prophylaxis/Anticoagulation: Pharmaceutical: Other (comment)--Eliquis 3. Pain Management: tylenol prn.  4. Mood: LCSW to follow for evaluation and support.  5. Neuropsych: This patient is not capable of making decisions on his own behalf. 6. Skin/Wound Care: Venous pressure relief measures.  Moisturization as needed recurrent seborrhea 7. Fluids/Electrolytes/Nutrition:     Prealbumin increased to 21   -po intake improved   .   8. CAD with ICM/CHF/ICD: Monitor for symptoms with activity.  Monitor for signs of overload and check weights daily.  Continue aspirin and metoprolol  Filed Weights   06/04/18 0327 06/04/18 0723 06/05/18 0447  Weight: 70 kg 70.1 kg 68.6 kg     -weights generally stable 9. A fib with RVR: Monitor heart rate twice daily.  Continue metoprolol twice daily. 10.  Urinary retention:  .     -UTI rx'ed, voiding without issues 11.  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  Bouts of agitation/sundowning:      -continue trazodone 50mg  at 2100  -seroquel in late afternoon and again at night for sundowning/sleep  -improved sleep pattern   13.  History of Barrett's esophagus with dysphagia: Continue PPI 14.  Seizures?/ At risk for seizures: On Vimpat twice daily    15. OSA: CPAP 16.  Acute blood loss anemia  Hemoglobin 12.1 11/18  Continue to monitor    LOS: 21 days A FACE TO FACE EVALUATION WAS PERFORMED  Meredith Staggers 06/05/2018, 9:00 AM

## 2018-06-05 NOTE — Progress Notes (Signed)
Social Work Patient ID: [name], male   DOB: 1941-01-16, 77 y.o.   MRN: [id number]   Have reviewed team conf with pt and family.  Plan now for pt to go SNF, however, family plans for this to be short term only.  Bed search underway.  Will keep team posted.  Shae Hinnenkamp, LCSW

## 2018-06-05 NOTE — Discharge Summary (Signed)
Physician Discharge Summary  Patient ID: [name] MRN: [id number] DOB/AGE: 09-27-1940 77 y.o.  Admit date: 05/15/2018 Discharge date: 06/11/2018  Discharge Diagnoses:  Principal Problem:   TBI (traumatic brain injury) ([place]) Active Problems:   NSVT (nonsustained ventricular tachycardia) (HCC)   HTN (hypertension)   Chronic atrial fibrillation (HCC)   Barrett's esophagus   COPD with emphysema (HCC)   ICD (implantable cardioverter-defibrillator) in place   Acute blood loss anemia   Chronic congestive heart failure ([place])   Seizures ([place])   Discharged Condition: stable   Significant Diagnostic Studies: No results found.  Labs:  Basic Metabolic Panel: BMP Latest Ref Rng & Units 06/10/2018 06/02/2018 05/27/2018  Glucose 70 - 99 mg/dL 115(H) 131(H) 92  BUN 8 - 23 mg/dL 17 16 14   Creatinine 0.61 - 1.24 mg/dL 0.89 0.83 0.75  Sodium 135 - 145 mmol/L 139 140 138  Potassium 3.5 - 5.1 mmol/L 4.3 3.7 3.7  Chloride 98 - 111 mmol/L 107 105 105  CO2 22 - 32 mmol/L 26 29 26   Calcium 8.9 - 10.3 mg/dL 8.9 9.1 8.9    CBC: CBC Latest Ref Rng & Units 06/10/2018 06/02/2018 05/27/2018  WBC 4.0 - 10.5 K/uL 4.5 4.2 4.2  Hemoglobin 13.0 - 17.0 g/dL 11.8(L) 12.1(L) 11.0(L)  Hematocrit 39.0 - 52.0 % 37.4(L) 39.3 34.8(L)  Platelets 150 - 400 K/uL 182 222 259    CBG: No results for input(s): GLUCAP in the last 168 hours.  Brief HPI:   [name] is a 77 year old male with history of A. fib, CAD/chronic systolic CHF EF 41% status post ICD, Barrett's esophagus, history of PE, OSA; who was originally hospitalized at [facility] anterior spillage and intermittent oral holding/2019 after TBI post fall with multifocal ICH, SAH treated with a EVD.  Hospital course prolonged requiring PEG for nutritional support as well as a Foley due to issues with urinary retention.  He was discharged to [facility] rehab but readmitted to [facility] 04/23/2018 after found unresponsive for prolonged period of time.  CT of head  negative for acute changes however EEG showed evidence of epileptiform discharges in left parietal region.  He was started on Vimpat per neurology input.    Hospital course significant for issues with orthostatic hypotension, A. fib with RVR urinary retention requiring replacement of Foley as well as sleep-wake disruption with bouts of agitation.  He did have CT chest revealing pleural plaques with question asbestosis and pulmonary follow-up recommended.  Repeat CT head 10/29 showed stable regions of encephalomalacia in bilateral frontal and temporal lobes left greater than right global volume loss with stable ventriculomegaly.  Patient continued to be limited by cognitive deficits with weakness, aphasia, variable p.o. intake as well as deficits in ADL and mobility.  CIR was recommended for follow-up therapy   Hospital Course: [name] was admitted to rehab 05/15/2018 for inpatient therapies to consist of PT, ST and OT at least three hours five days a week. Past admission physiatrist, therapy team and rehab RN have worked together to provide customized collaborative inpatient rehab.  He continues to have poor safety awareness with perseverative behaviors, distractibility due to Foley as well as PEG and sleep-wake disruption.  As per intake was noted to be covered PEG was DC'd without difficulty.  Foley was pulled out once and then replaced due to issues with retention.  He was found to have E. coli UTI and was treated with 1 week course of amoxicillin. Flomax was added without SE and foley was removed  11/09 with initiation of voiding program.  With improvement in activity and mentation, he is showing showing improvement in voiding function.   His mentation has improved with resolution of lethargy.    He did have breakthrough seizure during his stay felt to be due to missing doses of Vimpat.  EEG done showing persistent left parietal occipital temporal slowing indicating focal cerebral disturbance and  or epileptic focus however no evidence of ongoing seizures noted.  No changes in medications at this time and compliance recommended.  He has refused medications multiple times and these are now being given crushed in applesauce to help with compliance.  He is incontinent of bowel and bladder.  He has also had his poor sleep with tendency to sundown starting early evening.  Low-dose Seroquel has been scheduled at 4 PM with repeat at bedtime to help with sleep hygiene.  Melatonin and trazodone have also been scheduled to help manage insomnia.  Weights have been monitored daily and are relatively stable without signs of fluid overload.  Heart rate has been controlled on metoprolol twice daily.  He is tolerating Eliquis without any signs of bleeding.  Serial CBC shows H&H and platelets are stable.  Nutritional supplements has been offered due to low calorie malnutrition with prealbumin improving from 19.8-24.6.  Check of lites shows sodium as well as renal status is WNL.  He has made good gains in mobility and is currently at supervision to min assist level.  He continues to require assistance due to significant cognitive deficits and lack of awareness of deficits.  Family has elected on SNF for further therapy.  He was discharged to [facility] and rehab on 06/11/2018   Rehab course: During patient's stay in rehab weekly team conferences were held to monitor patient's progress, set goals and discuss barriers to discharge. At admission, patient required max assist with basic self-care task and mod assist with mobility.  He demonstrated moderate to severe global aphasia and dysphagia without signs of aspiration but required max to total assist to self-monitor and correct for intermittent oral holding.  He demonstrated behaviors consistent with RLAS IV.  He  has had improvement in activity tolerance, balance, postural control as well as ability to compensate for deficits.  He is able to complete bathing and  dressing with supervision and verbal cues. He requires supervision with verbal cues for bed mobility and transfers.  He is able to ambulate 130 feet with contact-guard assist and no assistive device.  He recovered he requires standby assist for static standing and standing balance.. Verbal output is decreased with jargon and errors.  He requires mod verbal cues with supervision for for safety during meals. He requires extra time with max multimodal cues for basic problem solving, attention and orientation  Disposition: [place].   Diet: Heart Healthy/dysphagia 3, thin liquids.   Special Instructions: 1 Needs supervision with meals. Offer supplements between meals and encourage intake. 2. Toilet patient every 4 hours while awake.  3. Follow up with neurology 2-3 weeks post discharge.   Discharge Instructions    Ambulatory referral to Physical Medicine Rehab   Complete by:  As directed    4-6 weeks follow up appointment     Allergies as of 06/11/2018   No Known Allergies     Medication List    STOP taking these medications   atorvastatin 80 MG tablet Commonly known as:  LIPITOR   calcium-vitamin D 500-200 MG-UNIT tablet Commonly known as:  OSCAL WITH  D   carvedilol 25 MG tablet Commonly known as:  COREG   dabigatran 150 MG Caps capsule Commonly known as:  PRADAXA   lisinopril 10 MG tablet Commonly known as:  PRINIVIL,ZESTRIL   omeprazole 40 MG capsule Commonly known as:  PRILOSEC     TAKE these medications   acetaminophen 500 MG tablet Commonly known as:  TYLENOL Take 500 mg by mouth every 6 (six) hours as needed (for pain.).   apixaban 5 MG Tabs tablet Commonly known as:  ELIQUIS Take 1 tablet (5 mg total) by mouth 2 (two) times daily.   aspirin EC 81 MG tablet Take 81 mg by mouth daily.   docusate sodium 100 MG capsule Commonly known as:  COLACE Take 1 capsule (100 mg total) by mouth daily.   finasteride 5 MG tablet Commonly known as:   PROSCAR Take 1 tablet (5 mg total) by mouth at bedtime.   lacosamide 200 MG Tabs tablet Commonly known as:  VIMPAT Take 1 tablet (200 mg total) by mouth 2 (two) times daily.   Melatonin 3 MG Tabs Take 2 tablets (6 mg total) by mouth at bedtime.   Metoprolol Tartrate 37.5 MG Tabs Take 37.5 mg by mouth 2 (two) times daily.   multivitamin with minerals tablet Take 1 tablet by mouth daily.   pantoprazole 40 MG tablet Commonly known as:  PROTONIX Take 1 tablet (40 mg total) by mouth daily.   polyethylene glycol packet Commonly known as:  MIRALAX / GLYCOLAX Take 17 g by mouth daily.   polyvinyl alcohol 1.4 % ophthalmic solution Commonly known as:  LIQUIFILM TEARS Place 1 drop into both eyes as needed for dry eyes.   QUEtiapine 25 MG tablet Commonly known as:  SEROQUEL Take 0.5 tablets (12.5 mg total) by mouth 2 (two) times daily as needed (agitation).   QUEtiapine 25 MG tablet Commonly known as:  SEROQUEL Take 0.5 tablets (12.5 mg total) by mouth 2 (two) times daily. At 4 pm and 10 pm as well as prn.   senna 8.6 MG Tabs tablet Commonly known as:  SENOKOT Take 2 tablets (17.2 mg total) by mouth at bedtime.   tamsulosin 0.4 MG Caps capsule Commonly known as:  FLOMAX Take 1 capsule (0.4 mg total) by mouth daily after supper.   traZODone 50 MG tablet Commonly known as:  DESYREL Take 1 tablet (50 mg total) by mouth at bedtime.   vitamin B-12 1000 MCG tablet Commonly known as:  CYANOCOBALAMIN Take 1,000 mcg by mouth daily.      Follow-up Information    Meredith Staggers, MD Follow up.   Specialty:  Physical Medicine and Rehabilitation Why:  office will call you with follow up appointment Contact information: [address] [place] [address] [phone]           Signed: Bary Leriche 06/11/2018, 8:13 AM

## 2018-06-05 NOTE — Patient Care Conference (Signed)
Inpatient RehabilitationTeam Conference and Plan of Care Update Date: 06/03/2018   Time: 2:30 PM    Patient Name: [name]      Medical Record Number: [id number]  Date of Birth: 06/17/1941 Sex: Male         Room/Bed: 4W07C/4W07C-01 Payor Info: Payor: MEDICARE / Plan: MEDICARE PART A AND B / Product Type: *No Product type* /    Admitting Diagnosis: TBI  Admit Date/Time:  05/15/2018  1:04 PM Admission Comments: No comment available   Primary Diagnosis:  TBI (traumatic brain injury) ([place]) Principal Problem: TBI (traumatic brain injury) [facility])  Patient Active Problem List   Diagnosis Date Noted  . Seizures ([place])   . Acute blood loss anemia   . Urinary retention   . Chronic congestive heart failure ([place])   . TBI (traumatic brain injury) ([place]) 05/15/2018  . ICD (implantable cardioverter-defibrillator) in place 05/21/2016  . COPD with emphysema ([place]) 04/10/2016  . Claudication of right lower extremity ([place]) 10/04/2015  . Preop cardiovascular exam 11/16/2014  . Palpitations 08/13/2013  . Chronic atrial fibrillation ([place]) 03/26/2013  . Hyperlipidemia 03/26/2013  . Barrett's esophagus 03/26/2013  . Obstructive sleep apnea 03/26/2013  . Cardiomyopathy, ischemic 12/19/2012  . HTN (hypertension) 12/19/2012  . Hx pulmonary embolism 12/19/2012  . NSVT (nonsustained ventricular tachycardia) ([place]) 10/23/2012  . CAD S/P multiple PCIs 10/23/2012  . PSVT (paroxysmal supraventricular tachycardia) ([place]) 09/30/2012  . Long term (current) use of anticoagulants 09/30/2012    Expected Discharge Date: Expected Discharge Date: (SNF)  Team Members Present: Physician leading conference: Dr. Alger Simons Social Worker Present: Lennart Pall, LCSW Nurse Present: Isla Pence, RN PT Present: Roderic Ovens, PT OT Present: Other (comment)(Sandra Rosana Hoes, OT) SLP Present: Weston Anna, SLP PPS Coordinator present : Daiva Nakayama, RN, CRRN     Current Status/Progress Goal Weekly Team Focus   Medical   sundowning, sleep issues. voiding well  see prior  ongoing modulation of psychotropics,    Bowel/Bladder   Patient is continent of bowel/bladder but incontinent of bowel/bladder at times. Requires I&O cath PRN for urinary retention  Able to have normal pattern of bowel/bladder function with mni assist  Assess bowel/bladder function q shift and as needed   Swallow/Nutrition/ Hydration   Dys. 3 textures with thin liquids, Min A  Min A  Family Education    ADL's   CGA bathing at shower level, (S) UB dressing, CGA LB dressing. min A toileting overall. Min A transfers  24/7 (S) ADLs and transfers  cognitive remediation, family education, ADL retraining, dynamic balance training   Mobility   min A transfers and gait  supervision overall  balance, gait, attention   Communication   Max A  Mod-Max A   verbal expression of wants/needs    Safety/Cognition/ Behavioral Observations  Max A  Mod-Max A  attention, problem solving    Pain   patient denied pain  <2  Assess and treat pain q shift and as neede   Skin   MASD to sacrum and old peg site to left lower abd.  maintain skin integrity with min assist  Assess skin q shift and as needed    Rehab Goals Patient on target to meet rehab goals: Yes *See Care Plan and progress notes for long and short-term goals.     Barriers to Discharge  Current Status/Progress Possible Resolutions Date Resolved   Physician    Medical stability;Behavior               Nursing  PT                    OT                  SLP                SW                Discharge Planning/Teaching Needs:  Plan has changed to SNF  NA   Team Discussion:  Better behavioral management with seroquel added at night.  Supervision - CGA - min assist overall.  Improved appetite.  Still working on language and seeing slow improvements.  SW reports dc plan changed to SNF.  Revisions to Treatment Plan:  Change in d/c plan.    Continued Need for  Acute Rehabilitation Level of Care: The patient requires daily medical management by a physician with specialized training in physical medicine and rehabilitation for the following conditions: Daily direction of a multidisciplinary physical rehabilitation program to ensure safe treatment while eliciting the highest outcome that is of practical value to the patient.: Yes Daily medical management of patient stability for increased activity during participation in an intensive rehabilitation regime.: Yes Daily analysis of laboratory values and/or radiology reports with any subsequent need for medication adjustment of medical intervention for : Neurological problems;Post surgical problems   I attest that I was present, lead the team conference, and concur with the assessment and plan of the team.   Saadiya Wilfong 06/05/2018, 2:11 PM

## 2018-06-06 ENCOUNTER — Inpatient Hospital Stay (HOSPITAL_COMMUNITY): Payer: Medicare Other | Admitting: Physical Therapy

## 2018-06-06 ENCOUNTER — Inpatient Hospital Stay (HOSPITAL_COMMUNITY): Payer: Medicare Other | Admitting: Speech Pathology

## 2018-06-06 ENCOUNTER — Inpatient Hospital Stay (HOSPITAL_COMMUNITY): Payer: Medicare Other

## 2018-06-06 NOTE — Progress Notes (Signed)
Occupational Therapy Weekly Progress Note  Patient Details  Name: [name] MRN: [id number] Date of Birth: 08/23/1940  Beginning of progress report period: May 30, 2018 End of progress report period: June 06, 2018  Today's Date: 06/06/2018 OT Individual Time: 1300-1400 OT Individual Time Calculation (min): 60 min    Patient has met 4 of 16 long term goals.  Short term goals not set due to estimated length of stay.  Pt is making great gains physically toward his LTGs. Pt consistently dons shirt with (S). He completes ADL transfers with CGA and his dynamic standing balance has improved significantly.   Patient continues to demonstrate the following deficits: muscle weakness, decreased initiation, decreased attention, decreased awareness, decreased problem solving, decreased safety awareness, decreased memory and delayed processing and decreased standing balance and decreased balance strategies and therefore will continue to benefit from skilled OT intervention to enhance overall performance with BADL and Reduce care partner burden.  See [name] Care Plan for progression toward long term goals.  Patient progressing toward long term goals..  Continue plan of care.  Skilled Therapeutic Interventions/Progress Updates:    Session focused on b/d tasks at shower level. Pt required only 1 cue to transfer into shower, doffing clothing in standing with CGA for balance. Pt initiated all bathing tasks in shower, requiring only close (S) for safety. Pt was easily redirected to completing LB dressing seated, requiring only occasional CGA for balance. With 1 questioning cue, pt able to identify pants on backwards and fix. Pt completed oral care at sink with HOH to correct improper use of toothbrush, but once initiated, able to brush teeth with (S). Pt completed 100 ft of functional mobility with min HHA. He sat on Nustep and completed 6 min in order to improve cardiorespiratory support. Pt  returned to room and was left sitting up in w/c with all needs met. Several instances of inappropriate touching of therapist by pt, but pt able to be redirected with verbal cues. No indications of pain throughout session.   Therapy Documentation Precautions:  Precautions Precautions: Fall Precaution Comments: Impulsive Restrictions Weight Bearing Restrictions: No   Vital Signs: Therapy Vitals Pulse Rate: 69 BP: 104/67 Pain: Pain Assessment Pain Scale: 0-10 Pain Score: 0-No pain  Therapy/Group: Individual Therapy  Curtis Sites 06/06/2018, 2:57 PM

## 2018-06-06 NOTE — Progress Notes (Signed)
Speech Language Pathology Daily Session Note  Patient Details  Name: QUENCY TOBER MRN: [id number] Date of Birth: July 16, 1941  Today's Date: 06/06/2018 SLP Individual Time: 1035-1130 SLP Individual Time Calculation (min): 55 min  Short Term Goals: Week 4: SLP Short Term Goal 1 (Week 4): STGs=LTGs  Skilled Therapeutic Interventions:  Pt was seen for skilled ST targeting goals for communication and dysphagia.  Pt was asleep upon therapist's arrival but awakened easily to voice and light touch and was agreeable to participate in ST.  SLP facilitated the session with a functional snack of regular textures to continue working towards diet progression.  Pt demonstrated grossly WFL mastication of advanced textures and complete clearance of residuals from the oral cavity without difficulty.  As a result recommend diet advancement to regular textures at this time with ongoing need for full supervision due to cognitive-linguistic deficits.  Pt was able to produced previously targeted CV syllables with min assist to correct errors when alternating between them.  Therapist also incorporated 5 additional CV syllables with new initial or final phonemes which pt was able to produce with min sentence completion cues. CVs targeted on this date were as follows: me, knee, my, no, moo, way, bay, may, boo, bye.  SLP facilitated the session with automatic singing tasks to elicit more language.  Pt was able to produce ~50% of words during the chorus and/or most familiar parts of the song with mod assist visual cues for phonemic placement.  Pt was returned to room and left in wheelchair with seat belt alarm in place and all needs within reach, family at bedside.  Continue per current plan of care.   Pain Pain Assessment Pain Scale: 0-10 Pain Score: 0-No pain  Therapy/Group: Individual Therapy  Nanetta Wiegman, Selinda Orion 06/06/2018, 3:55 PM

## 2018-06-06 NOTE — Progress Notes (Signed)
[facility] PHYSICAL MEDICINE & REHABILITATION PROGRESS NOTE   Subjective/Complaints: A little restless this morning per caregiver. Had a good night though. Only up to urinate.   ROS: Limited due to cognitive/behavioral   Objective:   No results found. No results for input(s): WBC, HGB, HCT, PLT in the last 72 hours. No results for input(s): NA, K, CL, CO2, GLUCOSE, BUN, CREATININE, CALCIUM in the last 72 hours.  Intake/Output Summary (Last 24 hours) at 06/06/2018 0919 Last data filed at 06/05/2018 2300 Gross per 24 hour  Intake 720 ml  Output -  Net 720 ml     Physical Exam: Vital Signs Blood pressure 129/80, pulse 73, temperature 98.1 F (36.7 C), temperature source Oral, resp. rate 16, height 6' (1.829 m), weight 69.2 kg, SpO2 99 %. Constitutional: No distress . Vital signs reviewed. HEENT: EOMI, oral membranes moist Neck: supple Cardiovascular: RRR without murmur. No JVD    Respiratory: CTA Bilaterally without wheezes or rales. Normal effort    GI: BS +, non-tender, non-distended  Musc: No edema or tenderness in extremities. Neurological: alert and moving all 4's. Attentive, aphasic, improving automatic language  Skin: He is not diaphoretic.  Psych: pleasant  Assessment/Plan: 1. Functional deficits secondary to TBI which require 3+ hours per day of interdisciplinary therapy in a comprehensive inpatient rehab setting.  Physiatrist is providing close team supervision and 24 hour management of active medical problems listed below.  Physiatrist and rehab team continue to assess barriers to discharge/monitor patient progress toward functional and medical goals  Care Tool:  Bathing  Bathing activity did not occur: Refused Body parts bathed by patient: Chest, Abdomen, Right arm, Left arm, Front perineal area, Buttocks, Face   Body parts bathed by helper: Buttocks, Front perineal area, Chest, Right arm, Left arm     Bathing assist Assist Level: Contact Guard/Touching  assist     Upper Body Dressing/Undressing Upper body dressing   What is the patient wearing?: Pull over shirt    Upper body assist Assist Level: Supervision/Verbal cueing    Lower Body Dressing/Undressing Lower body dressing      What is the patient wearing?: Underwear/pull up, Pants     Lower body assist Assist for lower body dressing: Supervision/Verbal cueing     Toileting Toileting    Toileting assist Assist for toileting: Minimal Assistance - Patient > 75%     Transfers Chair/bed transfer  Transfers assist     Chair/bed transfer assist level: Contact Guard/Touching assist     Locomotion Ambulation   Ambulation assist      Assist level: Contact Guard/Touching assist Assistive device: Hand held assist Max distance: 150   Walk 10 feet activity   Assist     Assist level: Contact Guard/Touching assist Assistive device: Hand held assist   Walk 50 feet activity   Assist Walk 50 feet with 2 turns activity did not occur: Safety/medical concerns  Assist level: Contact Guard/Touching assist Assistive device: Hand held assist    Walk 150 feet activity   Assist Walk 150 feet activity did not occur: Safety/medical concerns  Assist level: Contact Guard/Touching assist Assistive device: Hand held assist    Walk 10 feet on uneven surface  activity   Assist Walk 10 feet on uneven surfaces activity did not occur: Safety/medical concerns   Assist level: Minimal Assistance - Patient > 75%     Wheelchair     Assist Will patient use wheelchair at discharge?: Yes Type of Wheelchair: Manual    Wheelchair assist  level: Dependent - Patient 0%      Wheelchair 50 feet with 2 turns activity    Assist        Assist Level: Dependent - Patient 0%   Wheelchair 150 feet activity     Assist          Medical Problem List and Plan: 1.  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 Spoke with son regarding ongoing rehab needs moving forward    2.  DVT Prophylaxis/Anticoagulation: Pharmaceutical: Other (comment)--Eliquis 3. Pain Management: tylenol prn.  4. Mood: LCSW to follow for evaluation and support.  5. Neuropsych: This patient is not capable of making decisions on his own behalf. 6. Skin/Wound Care: Venous pressure relief measures.  Moisturization as needed recurrent seborrhea 7. Fluids/Electrolytes/Nutrition:     Prealbumin increased to 21   -po intake much improved   .   8. CAD with ICM/CHF/ICD: Monitor for symptoms with activity.  Monitor for signs of overload and check weights daily.  Continue aspirin and metoprolol  Filed Weights   06/04/18 0723 06/05/18 0447 06/06/18 0426  Weight: 70.1 kg 68.6 kg 69.2 kg     -weights   stable 9. A fib with RVR: Monitor heart rate twice daily.  Continue metoprolol twice daily. 10.  Urinary retention:  .     -UTI rx'ed, voiding without issues 11.  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  Bouts of agitation/sundowning:      -continue trazodone 50mg  at 2100  -seroquel in late afternoon and again at night for sundowning/sleep  -improved sleep pattern overall   13.  History of Barrett's esophagus with dysphagia: Continue PPI 14.  Seizures?/ At risk for seizures: On Vimpat twice daily    15. OSA: CPAP 16.  Acute blood loss anemia  Hemoglobin 12.1 11/18     LOS: 22 days A FACE TO [place] 06/06/2018, 9:19 AM

## 2018-06-06 NOTE — Progress Notes (Signed)
Physical Therapy Weekly Progress Note  Patient Details  Name: MASON DIBIASIO MRN: [id number] Date of Birth: [birth year]  Beginning of progress report period: May 30, 2018 End of progress report period: June 06, 2018  Patient has met 3 of 7 long term goals.  Pt requires min A for balance with gait but is improving bed mobility and transfers to supervision level. Pt continues with cognitive deficits requiring mod/max cuing for safety at all times.  Patient continues to demonstrate the following deficits muscle weakness, decreased coordination and decreased motor planning, decreased attention, decreased awareness, decreased problem solving, decreased safety awareness, decreased memory and delayed processing and decreased standing balance, decreased postural control and decreased balance strategies and therefore will continue to benefit from skilled PT intervention to increase functional independence with mobility.  Patient progressing toward long term goals..  Continue plan of care.  PT Short Term Goals Week 3:  PT Short Term Goal 1 (Week 3): = LTG Week 4:  PT Short Term Goal 1 (Week 4): = LTG  Skilled Therapeutic Interventions/Progress Updates:  Ambulation/gait training;Discharge planning;Functional mobility training;Therapeutic Activities;Balance/vestibular training;Neuromuscular re-education;Therapeutic Exercise;Wheelchair propulsion/positioning;Cognitive remediation/compensation;DME/adaptive equipment instruction;Pain management;Splinting/orthotics;UE/LE Strength taining/ROM;Community reintegration;Functional electrical stimulation;Patient/family education;Stair training;UE/LE Coordination activities   [place] 06/06/2018, 1:47 PM

## 2018-06-06 NOTE — Progress Notes (Signed)
Physical Therapy Session Note  Patient Details  Name: [name] MRN: [id number] Date of Birth: May 06, 1941  Today's Date: 06/06/2018 PT Individual Time: 9201-0071 PT Individual Time Calculation (min): 28 min   Short Term Goals: Week 4:  PT Short Term Goal 1 (Week 4): = LTG  Skilled Therapeutic Interventions/Progress Updates:  Patient received up in [facility] eating mac'n'cheese with family present and supportive/encouraging of participation in therapy today. Patient generally requires min guard for transfers and gait throughout session today except for one episode where he did not move his feet while trying to reach for towel in room despite PT cues and lost balance requiring maxA to prevent fall forwards. Able to gait train 123f with min guard and cues for improved step lengths, then played jenga in alternating sitting and standing positions for dynamic balance training and functional problem solving ability, and patient able to follow rules and general process of game with Min-Mod cues for correct sequencing and performance of game. Ambulated back to his room with min guard and [facility] providing direct supervision and case manager present, all needs otherwise met.   Therapy Documentation Precautions:  Precautions Precautions: Fall Precaution Comments: Impulsive Restrictions Weight Bearing Restrictions: No General:   Vital Signs:   Pain: Pain Assessment Pain Scale: 0-10 Pain Score: 0-No pain    Therapy/Group: Individual Therapy  KDeniece ReePT, DPT, CBIS  Supplemental Physical Therapist [facility]   Pager [phone] Rehab Office [phone]  06/06/2018, 3:43 PM

## 2018-06-06 NOTE — Progress Notes (Signed)
Physical Therapy Session Note  Patient Details  Name: [name] MRN: [id number] Date of Birth: 02-Dec-1940  Today's Date: 06/06/2018 PT Individual Time: 0830-0920 PT Individual Time Calculation (min): 50 min   Short Term Goals: Week 3:  PT Short Term Goal 1 (Week 3): = LTG  Skilled Therapeutic Interventions/Progress Updates:    pt up in w/c, ready for therapy. Pt performs gait throughout unit with HHA, CGA.  Side stepping and backward stepping with CGA for side stepping, min A for backward stepping and mod/max cuing for initiation and motor planning backward steps.  Seated LE therex for strengthening with mod cues to follow multimodal cues.  Stair negotiation x 12 stairs with bilat handrails with min A, increased time for motor planning this session.  Supine hip and LE stretching as pt is very fatigued and lays down after stair negotiation.  Pt then performs kinetron in standing 3 x 2 minutes.  Pt falling asleep frequently throughout session at each rest break, snoring and requiring cues to awaken.  RN made aware. Pt left in bed asleep with alarm set, needs at hand.  Therapy Documentation Precautions:  Precautions Precautions: Fall Precaution Comments: Impulsive Restrictions Weight Bearing Restrictions: No General: PT Amount of Missed Time (min): 10 Minutes PT Missed Treatment Reason: Patient fatigue Pain:  no c/o pain   Therapy/Group: Individual Therapy  Lona Six 06/06/2018, 9:21 AM

## 2018-06-06 NOTE — Progress Notes (Signed)
Up X 2 during night to void. At 0430, bladder scan=108cc's. Rested good, easily redirectable when OOB. No agitation. [name] A

## 2018-06-07 ENCOUNTER — Inpatient Hospital Stay (HOSPITAL_COMMUNITY): Payer: Medicare Other | Admitting: Occupational Therapy

## 2018-06-07 DIAGNOSIS — I482 Chronic atrial fibrillation, unspecified: Secondary | ICD-10-CM

## 2018-06-07 DIAGNOSIS — K22719 Barrett's esophagus with dysplasia, unspecified: Secondary | ICD-10-CM

## 2018-06-07 DIAGNOSIS — I10 Essential (primary) hypertension: Secondary | ICD-10-CM

## 2018-06-07 MED ORDER — METOPROLOL TARTRATE 25 MG PO TABS
37.5000 mg | ORAL_TABLET | Freq: Two times a day (BID) | ORAL | Status: DC
  Administered 2018-06-07 – 2018-06-11 (×8): 37.5 mg via ORAL
  Filled 2018-06-07 (×8): qty 1

## 2018-06-07 NOTE — Progress Notes (Addendum)
[name] is a 77 y.o. male Nov 13, 1940 [id number]  Subjective: No new complaints. Feeling OK.  Objective: Vital signs in last 24 hours: Temp:  [97.6 F (36.4 C)-98 F (36.7 C)] 97.6 F (36.4 C) (11/23 0302) Pulse Rate:  [78-81] 78 (11/23 0302) Resp:  [16] 16 (11/23 0302) BP: (93-123)/(53-71) 123/71 (11/23 0302) SpO2:  [96 %-99 %] 99 % (11/23 0302) Weight change:  Last BM Date: 06/06/18  Intake/Output from previous day: 11/22 0701 - 11/23 0700 In: 720 [P.O.:720] Out: -  Last cbgs: CBG (last 3)  No results for input(s): GLUCAP in the last 72 hours.   Physical Exam General: No apparent distress   HEENT: not dry Lungs: Normal effort. Lungs clear to auscultation, no crackles or wheezes. Cardiovascular: Regular rate and rhythm, no edema Abdomen: S/NT/ND; BS(+) Musculoskeletal:  unchanged Neurological: No new neurological deficits Wounds: N/A    Skin: clear  Aging changes Mental state: Alert, cooperative    Lab Results: BMET    Component Value Date/Time   NA 140 06/02/2018 1039   K 3.7 06/02/2018 1039   CL 105 06/02/2018 1039   CO2 29 06/02/2018 1039   GLUCOSE 131 (H) 06/02/2018 1039   BUN 16 06/02/2018 1039   CREATININE 0.83 06/02/2018 1039   CREATININE 0.77 05/11/2016 0926   CALCIUM 9.1 06/02/2018 1039   GFRNONAA >60 06/02/2018 1039   GFRAA >60 06/02/2018 1039   CBC    Component Value Date/Time   WBC 4.2 06/02/2018 1039   RBC 3.83 (L) 06/02/2018 1039   HGB 12.1 (L) 06/02/2018 1039   HCT 39.3 06/02/2018 1039   PLT 222 06/02/2018 1039   MCV 102.6 (H) 06/02/2018 1039   MCH 31.6 06/02/2018 1039   MCHC 30.8 06/02/2018 1039   RDW 16.3 (H) 06/02/2018 1039   LYMPHSABS 1.2 05/16/2018 0545   MONOABS 0.5 05/16/2018 0545   EOSABS 0.1 05/16/2018 0545   BASOSABS 0.0 05/16/2018 0545    Studies/Results: No results found.  Medications: I have reviewed the patient's current medications.  Assessment/Plan:   1.  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  Cognitive deficits.  Continue CIR therapies including PT, OT, SLP.  SNF still pending 2.  DVT prophylaxis/anticoagulation.  On Eliquis 3.  Pain management with Tylenol PRN 4.  Coronary artery disease.  Monitor for signs of fluid overload.  History of ICM/CHF/ICD.  On aspirin and metoprolol 5.  Atrial fibrillation.  Continue metoprolol and Eliquis 6.  Urinary retention 7.  Pleural plaques.  Possible asbestosis exposure.  Follow-up with pulmonary if needed after discharge 8.  Bouts of agitation and sundowning.  Trazodone at night.  Seroquel at night 9.  History of Barrett's esophagitis.  Continue with PPI 10.  At risk for seizures.  On Vimpat twice daily 11.  Obstructive sleep apnea on CPAP                     Length of stay, days: 23  Walker Kehr , MD 06/07/2018, 2:12 PM

## 2018-06-07 NOTE — Progress Notes (Signed)
Occupational Therapy Session Note  Patient Details  Name: [name] MRN: [id number] Date of Birth: May 30, 1941  Today's Date: 06/07/2018 OT Individual Time: 0900-1000 OT Individual Time Calculation (min): 60 min    Short Term Goals: Week 2:  OT Short Term Goal 1 (Week 2): t will appropriately use toothbrush/comn wiht min VC OT Short Term Goal 1 - Progress (Week 2): Partly met OT Short Term Goal 2 (Week 2): Pt will sequence bathing body parts with min VC OT Short Term Goal 2 - Progress (Week 2): Met OT Short Term Goal 3 (Week 2): Pt will initate doffing clothing prior to bathing wiht min VC OT Short Term Goal 3 - Progress (Week 2): Met OT Short Term Goal 4 (Week 2): Pt will transfer to toilet consistently wiht min A and LRAD OT Short Term Goal 4 - Progress (Week 2): Met  Skilled Therapeutic Interventions/Progress Updates: patient eating with setup upon approach for OT.   Offered shower, but he motioned to his male cousin to help him.   Provided education to his cousin as follows:   Bed to toilet HHA for functional mobility for toilet transfer.  He maintained safe dynamic standing for toileting with is cousin providing HHA to  [facility] as [name] fatigued from the functional ambulation. [name] was abl e to complete balance and clothing mgmt as he stood for voiding. [name] turned the water on and off and decided he did not want to shower.  (nursing had already helped him down clean pants earlier per his cousin due to an accident).   [name] was able to complete oral care with Min A to process putting paste on the brush and then sitting (due to reaching to sit in his chair).   His cousin and wife took [name] to ride around the unit at the end of the session.     Therapy Documentation Precautions:  Precautions Precautions: Fall Precaution Comments: Impulsive Restrictions Weight Bearing Restrictions: No    Pain:denied   Therapy/Group: Individual Therapy  Alfredia Ferguson  [facility] 06/07/2018, 12:32 PM

## 2018-06-08 NOTE — Progress Notes (Signed)
Pt is becoming agitated and son is trying to keep pt in the room and comfortable and pt repeatedly trying to get up and walk and mumbling, disoriented to place, time, situation, attempt to reorient and calm pt fail, prn dose of seroquel given.

## 2018-06-08 NOTE — Progress Notes (Signed)
[name] is a 77 y.o. male 1941-06-16 [id number]  Subjective: No new complaints. No new problems. Slept well. Feeling OK.  Objective: Vital signs in last 24 hours: Temp:  [97.4 F (36.3 C)-97.9 F (36.6 C)] 97.5 F (36.4 C) (11/24 0420) Pulse Rate:  [74-87] 74 (11/24 0822) Resp:  [18-20] 18 (11/24 0420) BP: (116-132)/(70-71) 116/70 (11/24 0420) SpO2:  [97 %-100 %] 97 % (11/24 0420) Weight change:  Last BM Date: 06/07/18  Intake/Output from previous day: 11/23 0701 - 11/24 0700 In: 1080 [P.O.:1080] Out: -  Last cbgs: CBG (last 3)  No results for input(s): GLUCAP in the last 72 hours.   Physical Exam General: No apparent distress   HEENT: not dry Lungs: Normal effort. Lungs clear to auscultation, no crackles or wheezes. Cardiovascular: Regular rate and rhythm, no edema Abdomen: S/NT/ND; BS(+) Musculoskeletal:  unchanged Neurological: No new neurological deficits Wounds: N/A    Skin: clear  Aging changes Mental state: Alert, cooperative    Lab Results: BMET    Component Value Date/Time   NA 140 06/02/2018 1039   K 3.7 06/02/2018 1039   CL 105 06/02/2018 1039   CO2 29 06/02/2018 1039   GLUCOSE 131 (H) 06/02/2018 1039   BUN 16 06/02/2018 1039   CREATININE 0.83 06/02/2018 1039   CREATININE 0.77 05/11/2016 0926   CALCIUM 9.1 06/02/2018 1039   GFRNONAA >60 06/02/2018 1039   GFRAA >60 06/02/2018 1039   CBC    Component Value Date/Time   WBC 4.2 06/02/2018 1039   RBC 3.83 (L) 06/02/2018 1039   HGB 12.1 (L) 06/02/2018 1039   HCT 39.3 06/02/2018 1039   PLT 222 06/02/2018 1039   MCV 102.6 (H) 06/02/2018 1039   MCH 31.6 06/02/2018 1039   MCHC 30.8 06/02/2018 1039   RDW 16.3 (H) 06/02/2018 1039   LYMPHSABS 1.2 05/16/2018 0545   MONOABS 0.5 05/16/2018 0545   EOSABS 0.1 05/16/2018 0545   BASOSABS 0.0 05/16/2018 0545    Studies/Results: No results found.  Medications: I have reviewed the patient's current medications.  Assessment/Plan:   1.   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  Cognitive deficits.  Continue with CIR including PT, OT, SLP.  SNF still pending 2.  DVT prophylaxis/anticoagulation.  On Eliquis 3.  Pain management with PRN Tylenol 4.  Coronary artery disease.  Monitor for signs of fluid overload.  History of ICM/CHF/ICD.  On aspirin and metoprolol 5.  Atrial fibrillation.  Continue metoprolol and Eliquis 6.  Urinary retention.  Continue to monitor 7.  Pleural plaques.  Possible asbestosis exposure.  Follow-up with pulmonary as an outpatient after discharge if needed 8.  Bouts of agitation with sundowning.  On trazodone at night.  Seroquel at night 9.  History of Barrett's esophagitis.  Continue with PPI 10.  At risk for seizures.  On the Vimpat twice daily 11.  Obstructive sleep apnea.  CPAP    Length of stay, days: 24  Walker Kehr , MD 06/08/2018, 2:02 PM

## 2018-06-08 NOTE — Progress Notes (Signed)
Physical Therapy Session Note  Patient Details  Name: [name] MRN: [id number] Date of Birth: 04-05-1941  Today's Date: 06/08/2018 PT Individual Time: 0930-1012 PT Individual Time Calculation (min): 42 min   Short Term Goals: Week 4:  PT Short Term Goal 1 (Week 4): = LTG  Skilled Therapeutic Interventions/Progress Updates:    pt performs gait throughout unit with CGA in controlled and home environments.  Seated UE therex with 5# dowel with pt able to follow multimodal cues with supervision this session.  Standing balance on tilt board, bosu ball with focus on wt shifts with 1 UE and 0 UE support with min A.  Gait with basketball dribbling with min/mod A as pt attempts to reach ball far out of BOS.  nustep x 8 minutes level 5 for UE/LE strength and coordination. Pt left in bed with family present, needs at hand.  Therapy Documentation Precautions:  Precautions Precautions: Fall Precaution Comments: Impulsive Restrictions Weight Bearing Restrictions: No Pain:  no c/o pain   Therapy/Group: Individual Therapy  DONAWERTH,KAREN 06/08/2018, 10:13 AM

## 2018-06-09 ENCOUNTER — Inpatient Hospital Stay (HOSPITAL_COMMUNITY): Payer: Medicare Other | Admitting: Speech Pathology

## 2018-06-09 ENCOUNTER — Inpatient Hospital Stay (HOSPITAL_COMMUNITY): Payer: Medicare Other | Admitting: Physical Therapy

## 2018-06-09 ENCOUNTER — Inpatient Hospital Stay (HOSPITAL_COMMUNITY): Payer: Medicare Other | Admitting: Occupational Therapy

## 2018-06-09 ENCOUNTER — Inpatient Hospital Stay (HOSPITAL_COMMUNITY): Payer: Medicare Other

## 2018-06-09 DIAGNOSIS — I472 Ventricular tachycardia: Secondary | ICD-10-CM

## 2018-06-09 NOTE — Progress Notes (Signed)
Speech Language Pathology Daily Session Note  Patient Details  Name: [name] MRN: [id number] Date of Birth: 12/21/1940  Today's Date: 06/09/2018 SLP Individual Time: 1030-1130 SLP Individual Time Calculation (min): 60 min  Short Term Goals: Week 4: SLP Short Term Goal 1 (Week 4): STGs=LTGs  Skilled Therapeutic Interventions: Skilled treatment session focused on cognitive and dysphagia goals. SLP facilitated session by providing Min A verbal cues for patient to identify the larger number in a basic card task. SLP increased complexity of task by having patient identify which item did not belong from a field of 4 with Max A multimodal cues and 30% accuracy.  Patient demonstrated decreased attention to tasks as well as decreased verbal expression with increased jargon and errors noted, suspect due to fatigue.  Patient consumed lunch meal of regular textures with thin liquids without overt s/s of aspiration but required Mod A verbal cues for use of small bites. Patient left upright consuming lunch with granddaughter present. Continue with current plan of care.      Pain Pain Assessment Pain Scale: 0-10 Pain Score: 0-No pain  Therapy/Group: Individual Therapy  Efraim Vanallen, [place] 06/09/2018, 1:08 PM

## 2018-06-09 NOTE — Progress Notes (Signed)
Occupational Therapy Session Note  Patient Details  Name: [name] MRN: [id number] Date of Birth: Dec 12, 1940  Today's Date: 06/09/2018 OT Individual Time: 6286-3817 OT Individual Time Calculation (min): 67 min    Short Term Goals: Week 4:  OT Short Term Goal 1 (Week 3): STG=LTG d/t ELOS  Skilled Therapeutic Interventions/Progress Updates:    Session focused on ADL retraining and cognitive remediation. Pt required mod cueing for ambulation into shower. Pt undressed with CGA in standing. Pt stood in shower and completed UB/LB bathing with (S) only. Good awareness of safety while in shower. Mod cueing for safety when transferring out of shower. Attempted to continue reinforcing edu re safety during LB dressing- including sitting to don socks/pants. Pt completed oral care at sink with mod cueing for correct use of toothbrush and for initiation of each step. Pt completed 130 ft of functional mobility with CGA and 1 seated rest break. Pt completed B UE strengthening circuit with 4lb dumbbells, with pt following demonstration with 100% accuracy. Pt returned to room and was left sitting up in w/c with family present.   Therapy Documentation Precautions:  Precautions Precautions: Fall Precaution Comments: Impulsive Restrictions Weight Bearing Restrictions: No Pain: Pain Assessment Pain Scale: 0-10 Pain Score: 0-No pain  Therapy/Group: Individual Therapy  Curtis Sites 06/09/2018, 11:24 AM

## 2018-06-09 NOTE — Progress Notes (Signed)
Occupational Therapy Session Note  Patient Details  Name: [name] MRN: [id number] Date of Birth: 1941/04/21  Today's Date: 06/09/2018 OT Individual Time: 1205-1230 OT Individual Time Calculation (min): 25 min   Skilled Therapeutic Interventions/Progress Updates:    Pt greeted seated in wc with granddaughter present. OT pushed wc to therapy gym for time management. Worked on hand eye coordination and counting with ball toss. With max verbal cues, but able to dictate numbers 1-20, but unable to sequence counting. Dynamic standing balance and problem solving with standing horse shoe toss. Max demonstrational cues to initiate toss, but then was able to understand concept of tossing horse shoe with repetition. Min A for balance when reaching outside base of support. Pt returned to room and left seated in wc with granddaughter present and needs met.   Therapy Documentation Precautions:  Precautions Precautions: Fall Precaution Comments: Impulsive Restrictions Weight Bearing Restrictions: No Pain: Pain Assessment Pain Scale: 0-10 Pain Score: 0-No pain   Therapy/Group: Individual Therapy  Valma Cava 06/09/2018, 12:20 PM

## 2018-06-09 NOTE — Progress Notes (Signed)
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 He had a prolonged hospitalization and required PEG tube for nutritional support. Pt transferred to [facility] on 04/03/18. Pt was found down on 04/23/18 after being unresponsive for a prolonged period of time. He was transferred to [facility] and Code Stroke initiated for workp. CT head was negative for acute changes but showed left temporal and right frontal encephalomalacia due to hemorrhagic contusions. CT also showed evidence of ventriculomegaly with question of normal pressure hydrocephalus. Pt admitted to CIR with functional deficits secondary to bifrontal and bitemporal intracranial hemorrhage resulting in encephalomalacia.  11/4 - PEG tube removed 11/18 - diet advanced to DYS 3 11/22 - diet advanced to Heart Healthy  Noted change in d/c plans; pt will now d/c to SNF.  Spoke with pt and family member at bedside. Noted 90% completed tray at bedside. Per pt's family member, pt ate everything except dessert.  Pt  and pt's family member confirm that pt is eating well. Pt states that he has a good appetite. Pt's family member reports that pt has not yet received an Ensure Enlive oral nutrition supplement. RD brought one to pt at end of visit.  Weight stable when compared to CIR admission weight. Noted weight has fluctuated since admission but has remained fairly stable over the past week.  Meal Completion: 100% x last 7 meals  Medications reviewed and include: calcium carbonate, vitamin D, Colace, Ensure Enlive BID, MVI with minerals, Protonix, Miralax, Senokot  Labs reviewed.  Diet Order:   Diet Order            Diet Heart Room service appropriate? Yes; Fluid consistency: Thin  Diet effective now              EDUCATION NEEDS:   No education needs have been identified at this time  Skin:  Skin Assessment: Skin Integrity Issues: Other: MASD to coccyx  Last BM:  11/24 (small type 5)  Height:   Ht Readings from Last 1 Encounters:  05/15/18 6' (1.829 m)    Weight:   Wt Readings from Last 1 Encounters:  06/09/18 69.5 kg    Ideal Body Weight:  80.9 kg  BMI:  Body mass index is 20.78 kg/m.  Estimated Nutritional Needs:   Kcal:  2100-2300  Protein:  100-115 grams  Fluid:  > 2.1 L    Gaynell Face, MS, RD, LDN Inpatient Clinical Dietitian Pager: [phone] Weekend/After Hours: [phone]

## 2018-06-09 NOTE — Progress Notes (Signed)
[place] PHYSICAL MEDICINE & REHABILITATION PROGRESS NOTE   Subjective/Complaints: Patient seen sitting up in his chair this morning.  He states he slept well overnight, confirmed by sleep chart.  Family at bedside.  ROS: Limited due to cognition  Objective:   No results found. No results for input(s): WBC, HGB, HCT, PLT in the last 72 hours. No results for input(s): NA, K, CL, CO2, GLUCOSE, BUN, CREATININE, CALCIUM in the last 72 hours.  Intake/Output Summary (Last 24 hours) at 06/09/2018 1103 Last data filed at 06/09/2018 0800 Gross per 24 hour  Intake 660 ml  Output -  Net 660 ml     Physical Exam: Vital Signs Blood pressure 120/71, pulse 63, temperature 97.6 F (36.4 C), temperature source Oral, resp. rate 18, height 6' (1.829 m), weight 69.5 kg, SpO2 93 %. Constitutional: No distress . Vital signs reviewed. HENT: Normocephalic.  Atraumatic. Eyes: EOMI. No discharge. Cardiovascular: RRR. No JVD. Respiratory: CTA Bilaterally. Normal effort. GI: BS +. Non-distended. Musc: No edema or tenderness in extremities. Neurological: Alert and moving all 4's without difficulty.  Expressive greater than receptive aphasia Skin: He is not diaphoretic.   Warm and dry.  Intact. Psych: pleasant  Assessment/Plan: 1. Functional deficits secondary to TBI which require 3+ hours per day of interdisciplinary therapy in a comprehensive inpatient rehab setting.  Physiatrist is providing close team supervision and 24 hour management of active medical problems listed below.  Physiatrist and rehab team continue to assess barriers to discharge/monitor patient progress toward functional and medical goals  Care Tool:  Bathing  Bathing activity did not occur: Refused Body parts bathed by patient: Chest, Abdomen, Right arm, Left arm, Front perineal area, Buttocks, Face, Right upper leg, Right lower leg, Left upper leg, Left lower leg   Body parts bathed by helper: Buttocks, Front perineal  area, Chest, Right arm, Left arm     Bathing assist Assist Level: Supervision/Verbal cueing     Upper Body Dressing/Undressing Upper body dressing   What is the patient wearing?: Pull over shirt    Upper body assist Assist Level: Supervision/Verbal cueing    Lower Body Dressing/Undressing Lower body dressing      What is the patient wearing?: Underwear/pull up, Pants     Lower body assist Assist for lower body dressing: Supervision/Verbal cueing     Toileting Toileting    Toileting assist Assist for toileting: Contact Guard/Touching assist     Transfers Chair/bed transfer  Transfers assist     Chair/bed transfer assist level: Contact Guard/Touching assist     Locomotion Ambulation   Ambulation assist      Assist level: Contact Guard/Touching assist Assistive device: Other (comment)(none ) Max distance: 150   Walk 10 feet activity   Assist     Assist level: Contact Guard/Touching assist Assistive device: Other (comment)(none )   Walk 50 feet activity   Assist Walk 50 feet with 2 turns activity did not occur: Safety/medical concerns  Assist level: Contact Guard/Touching assist Assistive device: Other (comment)(none )    Walk 150 feet activity   Assist Walk 150 feet activity did not occur: Safety/medical concerns  Assist level: Contact Guard/Touching assist Assistive device: Other (comment)(none )    Walk 10 feet on uneven surface  activity   Assist Walk 10 feet on uneven surfaces activity did not occur: Safety/medical concerns   Assist level: Minimal Assistance - Patient > 75%     Wheelchair     Assist Will patient use wheelchair at discharge?: Yes  Type of Wheelchair: Manual    Wheelchair assist level: Dependent - Patient 0%      Wheelchair 50 feet with 2 turns activity    Assist        Assist Level: Dependent - Patient 0%   Wheelchair 150 feet activity     Assist          Medical Problem List and  Plan: 1.  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  DVT Prophylaxis/Anticoagulation: Pharmaceutical: Other (comment)--Eliquis 3. Pain Management: tylenol prn.  4. Mood: LCSW to follow for evaluation and support.  5. Neuropsych: This patient is not capable of making decisions on his own behalf. 6. Skin/Wound Care: Venous pressure relief measures.  Moisturization as needed recurrent seborrhea 7. Fluids/Electrolytes/Nutrition:    Labs ordered for tomorrow 8. CAD with ICM/CHF/ICD: Monitor for symptoms with activity.  Monitor for signs of overload and check weights daily.  Continue aspirin and metoprolol  Filed Weights   06/05/18 0447 06/06/18 0426 06/09/18 0652  Weight: 68.6 kg 69.2 kg 69.5 kg     -weights  Stable on 11/25 9. A fib with RVR: Monitor heart rate twice daily.  Continue metoprolol twice daily. 10.  Urinary retention:  .     -UTI rx'ed, voiding without issues 11.  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  Bouts of agitation/sundowning:      -continue trazodone 50mg  at 2100  -seroquel in late afternoon and again at night for sundowning/sleep  -improved sleep pattern overall   13.  History of Barrett's esophagus with dysphagia: Continue PPI 14.  Seizures?/ At risk for seizures: On Vimpat twice daily 15. OSA: CPAP 16.  Acute blood loss anemia  Hemoglobin 12.1 11/18  Labs ordered for tomorrow     LOS: 25 days A FACE TO FACE EVALUATION WAS PERFORMED  Chinara Hertzberg Lorie Phenix 06/09/2018, 11:03 AM

## 2018-06-09 NOTE — Progress Notes (Signed)
Physical Therapy Session Note  Patient Details  Name: CHIVAS NOTZ MRN: [id number] Date of Birth: [birth year]  Today's Date: 06/09/2018 PT Individual Time: 6553-7482 PT Individual Time Calculation (min): 56 min   Short Term Goals: Week 4:  PT Short Term Goal 1 (Week 4): = LTG  Skilled Therapeutic Interventions/Progress Updates:    pt performs gait throughout unit with min A, continues with episodes of scissoring and LOB requiring min/mod A to correct.  Attempt to have pt engage in therapeutic tasks of zoom ball, pt able to perform with max multimodal cuing, requires max cues for attention to task.  Pt then performs UE therex with 5# dowel with mod multimodal cues, mod assist for attention to task.  Pt performs nustep x 5 minutes with max cues for attention to task.  Pt more with more perseverative speech this session, frequently asking "how old is your dad".  Pt also with decreased attention, unable to attend to any therapeutic task more than 1 minute without cuing.   Pt left in bed with alarm set, family present.  Therapy Documentation Precautions:  Precautions Precautions: Fall Precaution Comments: Impulsive Restrictions Weight Bearing Restrictions: No Pain: Pain Assessment Pain Scale: 0-10 Pain Score: 0-No pain   Therapy/Group: Individual Therapy  Alvin Diffee 06/09/2018, 2:59 PM

## 2018-06-10 ENCOUNTER — Inpatient Hospital Stay (HOSPITAL_COMMUNITY): Payer: Medicare Other | Admitting: Speech Pathology

## 2018-06-10 ENCOUNTER — Inpatient Hospital Stay (HOSPITAL_COMMUNITY): Payer: Medicare Other | Admitting: Physical Therapy

## 2018-06-10 ENCOUNTER — Inpatient Hospital Stay (HOSPITAL_COMMUNITY): Payer: Medicare Other | Admitting: Occupational Therapy

## 2018-06-10 LAB — BASIC METABOLIC PANEL
ANION GAP: 6 (ref 5–15)
BUN: 17 mg/dL (ref 8–23)
CALCIUM: 8.9 mg/dL (ref 8.9–10.3)
CO2: 26 mmol/L (ref 22–32)
Chloride: 107 mmol/L (ref 98–111)
Creatinine, Ser: 0.89 mg/dL (ref 0.61–1.24)
GFR calc Af Amer: >60 mL/min (ref >60)
GLUCOSE: 115 mg/dL — AB (ref 70–99)
Potassium: 4.3 mmol/L (ref 3.5–5.1)
Sodium: 139 mmol/L (ref 135–145)

## 2018-06-10 LAB — CBC WITH DIFFERENTIAL/PLATELET
Abs Immature Granulocytes: 0.01 10*3/uL (ref 0.00–0.07)
BASOS PCT: 0 %
Basophils Absolute: 0 10*3/uL (ref 0.0–0.1)
EOS ABS: 0.1 10*3/uL (ref 0.0–0.5)
EOS PCT: 2 %
HCT: 37.4 % — ABNORMAL LOW (ref 39.0–52.0)
Hemoglobin: 11.8 g/dL — ABNORMAL LOW (ref 13.0–17.0)
Immature Granulocytes: 0 %
Lymphocytes Relative: 15 %
Lymphs Abs: 0.7 10*3/uL (ref 0.7–4.0)
MCH: 32.2 pg (ref 26.0–34.0)
MCHC: 31.6 g/dL (ref 30.0–36.0)
MCV: 101.9 fL — AB (ref 80.0–100.0)
MONO ABS: 0.4 10*3/uL (ref 0.1–1.0)
MONOS PCT: 8 %
Neutro Abs: 3.3 10*3/uL (ref 1.7–7.7)
Neutrophils Relative %: 75 %
PLATELETS: 182 10*3/uL (ref 150–400)
RBC: 3.67 MIL/uL — ABNORMAL LOW (ref 4.22–5.81)
RDW: 15.9 % — AB (ref 11.5–15.5)
WBC: 4.5 10*3/uL (ref 4.0–10.5)
nRBC: 0 % (ref 0.0–0.2)

## 2018-06-10 NOTE — Progress Notes (Signed)
Speech Language Pathology Daily Session Note  Patient Details  Name: [name] MRN: [id number] Date of Birth: [birth year]  Today's Date: 06/10/2018 SLP Individual Time: 0930-1025 SLP Individual Time Calculation (min): 55 min  Short Term Goals: Week 4: SLP Short Term Goal 1 (Week 4): STGs=LTGs  Skilled Therapeutic Interventions: Skilled treatment session focused on cognitive goals. SLP facilitated session by providing extra time and Max A multimodal cues for problem solving, attention and error awareness during a pipe tree task. Patient also required Max A multimodal cues for attention and problem solving during a basic card task and identified the cards proper number and/or colors with Min A verbal cues. Patient with increased attention compared to previous session but continues to require increased cueing with all cognitive tasks.  Patient left upright in wheelchair with fiance present. Continue with current plan of care.      Pain No/Denies Pain   Therapy/Group: Individual Therapy  Samanta Gal 06/10/2018, 1:07 PM

## 2018-06-10 NOTE — Plan of Care (Signed)
  Problem: Consults Goal: RH BRAIN INJURY PATIENT EDUCATION Description Description: See Patient Education module for eduction specifics Outcome: Progressing   Problem: RH BOWEL ELIMINATION Goal: RH STG MANAGE BOWEL WITH ASSISTANCE Description STG Manage Bowel with min Assistance.  Outcome: Progressing   Problem: RH BLADDER ELIMINATION Goal: RH STG MANAGE BLADDER WITH ASSISTANCE Description STG Manage Bladder With min Assistance  Outcome: Progressing   Problem: RH SAFETY Goal: RH STG ADHERE TO SAFETY PRECAUTIONS W/ASSISTANCE/DEVICE Description STG Adhere to Safety Precautions With min Assistance and appropriate assistive Device.  Outcome: Progressing   Problem: RH PAIN MANAGEMENT Goal: RH STG PAIN MANAGED AT OR BELOW PT'S PAIN GOAL Description <3 on a 1-10 pain scale  Outcome: Progressing

## 2018-06-10 NOTE — Plan of Care (Signed)
  Problem: RH Toilet Transfers Goal: LTG Patient will perform toilet transfers w/assist (OT) Description LTG: Patient will perform toilet transfers with assist, with/without cues using equipment (OT) Outcome: Not Met (add Reason) Note:  Pt continues to require CGA for functional transfers   Problem: RH Tub/Shower Transfers Goal: LTG Patient will perform tub/shower transfers w/assist (OT) Description LTG: Patient will perform tub/shower transfers with assist, with/without cues using equipment (OT) 06/10/2018 2124 by Valma Cava, OT Note:  Pt continues to require CGA for functional transfers 06/10/2018 2123 by Willodene Stallings, Daneen Schick, OT Outcome: Not Met (add Reason)

## 2018-06-10 NOTE — Progress Notes (Signed)
Social Work Patient ID: [name], male   DOB: 04-29-1941, 77 y.o.   MRN: [id number]   Received SNF bed offer today from [facility] who can admit pt to private room tomorrow.  Daughter and fiance have accepted bed offer and request ambulance transfer.  Will plan d/c after lunch tomorrow.  Ko Bardon, LCSW

## 2018-06-10 NOTE — Progress Notes (Signed)
[place] PHYSICAL MEDICINE & REHABILITATION PROGRESS NOTE   Subjective/Complaints: Patient seen sitting up in his chair this morning.  Therapy is attempting to work with him.  Patient is confused and perseverative.  Increased confusion per therapies.  Patient slept fairly overnight per sleep chart.  ROS: Limited due to cognition  Objective:   No results found. No results for input(s): WBC, HGB, HCT, PLT in the last 72 hours. No results for input(s): NA, K, CL, CO2, GLUCOSE, BUN, CREATININE, CALCIUM in the last 72 hours.  Intake/Output Summary (Last 24 hours) at 06/10/2018 0935 Last data filed at 06/10/2018 0845 Gross per 24 hour  Intake 1080 ml  Output -  Net 1080 ml     Physical Exam: Vital Signs Blood pressure 112/75, pulse 70, temperature 98.7 F (37.1 C), resp. rate 19, height 6' (1.829 m), weight 69.5 kg, SpO2 97 %. Constitutional: No distress . Vital signs reviewed. HENT: Normocephalic.  Atraumatic. Eyes: EOMI. No discharge. Cardiovascular: RRR.  No JVD. Respiratory: CTA bilaterally.  Normal effort. GI: BS +. Non-distended. Musc: No edema or tenderness in extremities. Neurological: Alert and moving all 4's without difficulty.  Global aphasia Skin: He is not diaphoretic.   Warm and dry.  Intact. Psych: pleasant  Assessment/Plan: 1. Functional deficits secondary to TBI which require 3+ hours per day of interdisciplinary therapy in a comprehensive inpatient rehab setting.  Physiatrist is providing close team supervision and 24 hour management of active medical problems listed below.  Physiatrist and rehab team continue to assess barriers to discharge/monitor patient progress toward functional and medical goals  Care Tool:  Bathing  Bathing activity did not occur: Refused Body parts bathed by patient: Chest, Abdomen, Right arm, Left arm, Front perineal area, Buttocks, Face, Right upper leg, Right lower leg, Left upper leg, Left lower leg   Body parts bathed by  helper: Buttocks, Front perineal area, Chest, Right arm, Left arm     Bathing assist Assist Level: Supervision/Verbal cueing     Upper Body Dressing/Undressing Upper body dressing   What is the patient wearing?: Pull over shirt    Upper body assist Assist Level: Supervision/Verbal cueing    Lower Body Dressing/Undressing Lower body dressing      What is the patient wearing?: Underwear/pull up, Pants     Lower body assist Assist for lower body dressing: Contact Guard/Touching assist     Toileting Toileting    Toileting assist Assist for toileting: Contact Guard/Touching assist     Transfers Chair/bed transfer  Transfers assist     Chair/bed transfer assist level: Contact Guard/Touching assist     Locomotion Ambulation   Ambulation assist      Assist level: Contact Guard/Touching assist Assistive device: Other (comment)(none ) Max distance: 130   Walk 10 feet activity   Assist     Assist level: Contact Guard/Touching assist Assistive device: Other (comment)(none )   Walk 50 feet activity   Assist Walk 50 feet with 2 turns activity did not occur: Safety/medical concerns  Assist level: Contact Guard/Touching assist Assistive device: Other (comment)(none )    Walk 150 feet activity   Assist Walk 150 feet activity did not occur: Safety/medical concerns  Assist level: Contact Guard/Touching assist Assistive device: Other (comment)(none )    Walk 10 feet on uneven surface  activity   Assist Walk 10 feet on uneven surfaces activity did not occur: Safety/medical concerns   Assist level: Minimal Assistance - Patient > 75%     Wheelchair  Assist Will patient use wheelchair at discharge?: Yes Type of Wheelchair: Manual    Wheelchair assist level: Dependent - Patient 0%      Wheelchair 50 feet with 2 turns activity    Assist        Assist Level: Dependent - Patient 0%   Wheelchair 150 feet activity     Assist           Medical Problem List and Plan: 1.  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  DVT Prophylaxis/Anticoagulation: Pharmaceutical: Other (comment)--Eliquis 3. Pain Management: tylenol prn.  4. Mood: LCSW to follow for evaluation and support.  5. Neuropsych: This patient is not capable of making decisions on his own behalf. 6. Skin/Wound Care: Venous pressure relief measures.  Moisturization as needed recurrent seborrhea 7. Fluids/Electrolytes/Nutrition:    BMP was acceptable range on 11/18, labs pending 8. CAD with ICM/CHF/ICD: Monitor for symptoms with activity.  Monitor for signs of overload and check weights daily.  Continue aspirin and metoprolol  Filed Weights   06/05/18 0447 06/06/18 0426 06/09/18 0652  Weight: 68.6 kg 69.2 kg 69.5 kg     -weights  Stable on 11/26 9. A fib with RVR: Monitor heart rate twice daily.  Continue metoprolol twice daily. 10.  Urinary retention:      -UTI rx'ed, voiding without issues 11.  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  Bouts of agitation/sundowning:      -continue trazodone 50mg  at 2100  -seroquel in late afternoon and again at night for sundowning/sleep  -improved sleep pattern overall   13.  History of Barrett's esophagus with dysphagia: Continue PPI 14.  Seizures?/ At risk for seizures: On Vimpat twice daily 15. OSA: CPAP 16.  Acute blood loss anemia  Hemoglobin 12.1 11/18  Labs pending     LOS: 26 days A FACE TO FACE EVALUATION WAS PERFORMED   Lorie Phenix 06/10/2018, 9:35 AM

## 2018-06-10 NOTE — Progress Notes (Signed)
Occupational Therapy Session Note  Patient Details  Name: [name] MRN: [id number] Date of Birth: 03-28-1941  Today's Date: 06/10/2018  Session 1 OT Individual Time: 5852-7782 OT Individual Time Calculation (min): 40 min   Session 2 OT Individual Time: 1303-1330 OT Individual Time Calculation (min): 27 min    Short Term Goals: Week 3:  OT Short Term Goal 1 (Week 3): STG=LTG d/t ELOS  Skilled Therapeutic Interventions/Progress Updates:  Session 1 Pt greeted sitting in wc at the sink with girlfriend trimming pt's beard. Pt agreeable to shower this am. Pt needed verbal cues to initiate clothing removal prior to shower, but was able to sequence 2 steps at a time within familiar undressing task with instructional cues. Pt perseverative on washing R arm initially. OT directed pt to wash other body parts and he was able to continue washing 75% of body with only min verbal cues and CGA. Dressing completed wc at the sink with instructional cues and demonstration for UB dressing 2/2 difficulty orienting shirt and pants. 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 Pt left seated in wc at end of session with girlfriend present and needs met.   Session 2 Pt greeted semi-reclined in bed and agreeable to OT. Pt completed stand-pivot to wc with increased time, verbal cues, and close supervision. OT propelled wc to gym for time management. Utilized dynavision for activity focused on attention, initiation, balance, and finger isolation. Pt needed demonstrational cues and increased time to initiate hitting light buttons initially, but with repetition, was eventually able to hit up to 10 buttons in a row without cues. Increased difficulty attending to task when standing requring max verbal cues to hit buttons. Pt returned to room at end of session and left seated in wc with family present and needs met.     Therapy  Documentation Precautions:  Precautions Precautions: Fall Precaution Comments: Impulsive Restrictions Weight Bearing Restrictions: No Pain: None/denies pain    Therapy/Group: Individual Therapy  Valma Cava 06/10/2018, 3:32 PM

## 2018-06-10 NOTE — Progress Notes (Signed)
Physical Therapy Session Note  Patient Details  Name: EAIN MULLENDORE MRN: [id number] Date of Birth: 11/05/1940  Today's Date: 06/10/2018 PT Individual Time: 0700-0755 PT Individual Time Calculation (min): 55 min   Short Term Goals: Week 4:  PT Short Term Goal 1 (Week 4): = LTG  Skilled Therapeutic Interventions/Progress Updates:    pt in bed, agreeable to therapy.  Pt performs gait with min A throughout unit. Pt unable to attend to therapeutic activities of corn hole, ball toss or nu step for more than 30 seconds with max cuing.  PT then takes pt to bathroom. Pt requires max cues for clothing negotiation and toilet transfer. Pt incontinent of bowel.  Pt then requires increased time and max cuing for changing clothes. PT then attempted therapeutic activities, pt refusing, saying "what the fuck" and pt then returned to room and set up for breakfast.  MD aware of change in pt's behavior.  Therapy Documentation Precautions:  Precautions Precautions: Fall Precaution Comments: Impulsive Restrictions Weight Bearing Restrictions: No Pain:  no c/o pain   Therapy/Group: Individual Therapy  Kemuel Buchmann 06/10/2018, 7:56 AM

## 2018-06-10 NOTE — Progress Notes (Signed)
Physical Therapy Session Note  Patient Details  Name: [name] MRN: [id number] Date of Birth: 12-Feb-1941  Today's Date: 06/10/2018 PT Individual Time: 1400-1448 PT Individual Time Calculation (min): 48 min   Short Term Goals: Week 4:  PT Short Term Goal 1 (Week 4): = LTG  Skilled Therapeutic Interventions/Progress Updates:    Patient received sitting in [facility], family present and observed session today, and willing to participate in therapy. Able to perform transfers with min guard and cues for safety, and gait multiple distances of 211ft with min guard and cues for improved step length. Worked on Agricultural consultant in PT gym to attempt to focus attention and attendance to task with patient able to participate for 2 games but unable to follow correct rules even with Max cuing from PT, perseverating on straightening tiles and adjusting tower. He then began to behave as if he needed to have a bowel movement, ambulated back to his room and attempted to use toilet however he kept stating "no" and would not allow his brief to be pulled down, continued to state "no" when cued to attempt to use bathroom. Returned to gait training and attempted to perform sorting/organizing task in kitchen for cognitive exercise and practice attending to task for extended period of time, however patient became mildly dyspneic and flushed and perseverated on holding pants/bladder area. Symptoms (dyspnea and flushing, fatigue) appeared to resolve when sitting in chair in apartment, patient continues to perseverate on holding pants/bladder area and only states "I feel bad" and "no" when asked to try using the restroom. Unable to remove perseveration/focus from bladder/groin area for participation in therapeutic activities and ambulated back to room with min guard; he was left in bed with bed alarm active and RN aware of patient status, preparing to perform bladder scan. Education provided to family regarding patient performance and  increased confusion today as well as intent and purpose of activities working on task attendance and cognitive problem solving performed as able this session. 12 minutes of skilled therapy time missed due to nursing care.   Therapy Documentation Precautions:  Precautions Precautions: Fall Precaution Comments: Impulsive Restrictions Weight Bearing Restrictions: No General: PT Amount of Missed Time (min): 12 Minutes PT Missed Treatment Reason: Nursing care Pain: Pain Assessment Pain Scale: Faces Pain Score: 0-No pain Faces Pain Scale: No hurt    Therapy/Group: Individual Therapy  Deniece Ree PT, DPT, CBIS  Supplemental Physical Therapist [facility]    Pager [phone] Acute Rehab Office [phone]   06/10/2018, 3:48 PM

## 2018-06-10 NOTE — Progress Notes (Signed)
Occupational Therapy Discharge Summary  Patient Details  Name: [name] MRN: [id number] Date of Birth: 1940-10-14  Patient has met 11 of 13  long term goals due to improved activity tolerance, improved balance, postural control, ability to compensate for deficits, improved attention, improved awareness and improved coordination.  Patient to discharge at overall supervision/ Min Assist level.  [name] care partner unavailable to provide the necessary physical and cognitive assistance at discharge therefore recommend dc to SNF for continued OT at next level of care.    Reasons goals not met: Pt continues to need CGA for functional toilet and shower transfers.   Recommendation:  Patient will benefit from ongoing skilled OT services in skilled nursing facility setting to continue to advance functional skills in the area of BADL and Reduce care partner burden.  Equipment: No equipment provided  Reasons for discharge: treatment goals met and discharge from hospital  Patient/family agrees with progress made and goals achieved: Yes  OT Discharge Precautions/Restrictions  Precautions Precautions: Fall Precaution Comments: Impulsive Restrictions Weight Bearing Restrictions: No Pain  none/denies pain ADL ADL Eating: Supervision/safety, Set up Grooming: Setup, Supervision/safety Where Assessed-Grooming: Sitting at sink Upper Body Bathing: Supervision/safety, Setup Where Assessed-Upper Body Bathing: Shower Lower Body Bathing: Supervision/safety, Setup Where Assessed-Lower Body Bathing: Shower Upper Body Dressing: Setup, Supervision/safety, Moderate cueing Where Assessed-Upper Body Dressing: Sitting at sink Lower Body Dressing: Setup, Supervision/safety Where Assessed-Lower Body Dressing: Sitting at sink Toileting: Supervision/safety, Setup Toilet Transfer: Therapist, music Method: Stand pivot Tub/Shower Equipment: Walk in Facilities manager Transfer: Agricultural engineer Method: Stand pivot Perception  Perception: Within Functional Limits Praxis Praxis: Impaired Praxis Impairment Details: Ideation;Initiation;Ideomotor;Motor planning;Perseveration Cognition Overall Cognitive Status: Impaired/Different from baseline Arousal/Alertness: Awake/alert Orientation Level: Oriented to person;Disoriented to place;Disoriented to time;Disoriented to situation Focused Attention: Impaired Sustained Attention: Impaired Memory: Impaired Memory Impairment: Decreased long term memory;Decreased short term memory(Difficult to objectively assess d/t aphasia) Awareness: Impaired Awareness Impairment: Intellectual impairment Decision Making: Impaired Initiating: Impaired Self Monitoring: Impaired Self Correcting: Impaired Behaviors: Perseveration;Restless Safety/Judgment: Impaired Rancho [employer] Scales of Cognitive Functioning: Confused/appropriate Sensation Sensation Light Touch: Appears Intact Coordination Gross Motor Movements are Fluid and Coordinated: No Fine Motor Movements are Fluid and Coordinated: No Coordination and Movement Description: improved since eval Mobility  Bed Mobility Bed Mobility: Supine to Sit;Sit to Supine Supine to Sit: Supervision/Verbal cueing Sit to Supine: Supervision/Verbal cueing Transfers Sit to Stand: Supervision/Verbal cueing  Trunk/Postural Assessment  Cervical Assessment Cervical Assessment: Within Functional Limits Thoracic Assessment Thoracic Assessment: Exceptions to WFL(mild kyphosis) Lumbar Assessment Lumbar Assessment: Exceptions to WFL(posterior pelvic tilt)  Balance Balance Balance Assessed: Yes Static Sitting Balance Static Sitting - Balance Support: Feet supported Static Sitting - Level of Assistance: 5: Stand by assistance Dynamic Sitting Balance Dynamic Sitting - Balance Support: Feet supported Dynamic Sitting - Level of Assistance: 5: Stand by assistance Static Standing  Balance Static Standing - Balance Support: During functional activity;Right upper extremity supported Static Standing - Level of Assistance: 5: Stand by assistance Dynamic Standing Balance Dynamic Standing - Balance Support: During functional activity Dynamic Standing - Level of Assistance: 5: Stand by assistance Extremity/Trunk Assessment RUE Assessment RUE Assessment: Within Functional Limits General Strength Comments: Unable to formally test, but during ADL B UE observed to be [facility] [place] for AROM/strength LUE Assessment LUE Assessment: Within Functional Limits General Strength Comments: Unable to formally test, but during ADL B UE observed to be [facility] for AROM/strength   [name] [name] 06/10/2018, 9:29 PM

## 2018-06-10 NOTE — Patient Care Conference (Signed)
Inpatient RehabilitationTeam Conference and Plan of Care Update Date: 06/10/2018   Time: 10:30 AM    Patient Name: [name]      Medical Record Number: [id number]  Date of Birth: 04-10-1941 Sex: Male         Room/Bed: 4W07C/4W07C-01 Payor Info: Payor: MEDICARE / Plan: MEDICARE PART A AND B / Product Type: *No Product type* /    Admitting Diagnosis: TBI  Admit Date/Time:  05/15/2018  1:04 PM Admission Comments: No comment available   Primary Diagnosis:  TBI (traumatic brain injury) ([place]) Principal Problem: TBI (traumatic brain injury) [facility])  Patient Active Problem List   Diagnosis Date Noted  . Seizures ([employer])   . Acute blood loss anemia   . Chronic congestive heart failure ([place])   . TBI (traumatic brain injury) ([place]) 05/15/2018  . ICD (implantable cardioverter-defibrillator) in place 05/21/2016  . COPD with emphysema ([place]) 04/10/2016  . Claudication of right lower extremity ([place]) 10/04/2015  . Preop cardiovascular exam 11/16/2014  . Palpitations 08/13/2013  . Chronic atrial fibrillation ([place]) 03/26/2013  . Hyperlipidemia 03/26/2013  . Barrett's esophagus 03/26/2013  . Obstructive sleep apnea 03/26/2013  . Cardiomyopathy, ischemic 12/19/2012  . HTN (hypertension) 12/19/2012  . Hx pulmonary embolism 12/19/2012  . NSVT (nonsustained ventricular tachycardia) ([place]) 10/23/2012  . CAD S/P multiple PCIs 10/23/2012  . PSVT (paroxysmal supraventricular tachycardia) ([place]) 09/30/2012  . Long term (current) use of anticoagulants 09/30/2012    Expected Discharge Date: Expected Discharge Date: (SNF)  Team Members Present: Physician leading conference: Dr. 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  Improve mobility, safety, cognition  See above   Bowel/Bladder   continent of bowel & bladder, LBM 11/24  remain continent  monitor with PVR   Swallow/Nutrition/ Hydration   Regular textures with thin liquids, Min-Mod A  Min A  tolerance of diet upgrade    ADL's   CGA- (S) level bathing at shower level, CGA toileting and LB dressing  24/7 (S) ADLs and transfers  cognitive remediation, ADL retraining, family education, fall prevention/safety awareness   Mobility   supervision transfers, min A gait  supervision overall  d/c planning, family ed, attention, awareness, balance   Communication   Max A  Mod-Max A   verbal expression of wants/needs, auditory comprehension    Safety/Cognition/ Behavioral Observations  Max A  Mod-Max A  attention, problem solving, awareness    Pain   no c/o pain, has tylenol prn  pain scale <2/10  assess & treat as needed   Skin   MASD tio perineum/scrotum  no skin break down  asses q shift    Rehab Goals Patient on target to meet rehab goals: Yes *See Care Plan and progress notes for long and short-term goals.     Barriers to Discharge  Current Status/Progress Possible Resolutions Date Resolved   Physician    Medical stability;Behavior;Decreased caregiver support;Home environment access/layout     See above  Therapies, plan for SNF      Nursing  Behavior               PT  OT                  SLP                SW                Discharge Planning/Teaching Needs:  Plan has changed to SNF  NA   Team Discussion:  Little change.  SW reports SNF bed offer expected to confirm today and hopes for d/c tomorrow.  MD and team say pt ready for transition to SNF.  Revisions to Treatment Plan:  NA    Continued Need for Acute Rehabilitation Level of Care: The patient requires daily medical management by a  physician with specialized training in physical medicine and rehabilitation for the following conditions: Daily direction of a multidisciplinary physical rehabilitation program to ensure safe treatment while eliciting the highest outcome that is of practical value to the patient.: Yes Daily medical management of patient stability for increased activity during participation in an intensive rehabilitation regime.: Yes Daily analysis of laboratory values and/or radiology reports with any subsequent need for medication adjustment of medical intervention for : Neurological problems;Post surgical problems;Mood/behavior problems   I attest that I was present, lead the team conference, and concur with the assessment and plan of the team.   Jaimee Corum 06/10/2018, 4:41 PM

## 2018-06-11 ENCOUNTER — Inpatient Hospital Stay (HOSPITAL_COMMUNITY): Payer: Medicare Other

## 2018-06-11 DIAGNOSIS — Z95810 Presence of automatic (implantable) cardiac defibrillator: Secondary | ICD-10-CM

## 2018-06-11 MED ORDER — FINASTERIDE 5 MG PO TABS: 5.0000 mg | ORAL_TABLET | Freq: Every day | ORAL | Status: AC

## 2018-06-11 MED ORDER — DOCUSATE SODIUM 100 MG PO CAPS: 100.0000 mg | ORAL_CAPSULE | Freq: Every day | ORAL | 0 refills | Status: DC

## 2018-06-11 MED ORDER — QUETIAPINE FUMARATE 25 MG PO TABS: 12.5000 mg | ORAL_TABLET | Freq: Two times a day (BID) | ORAL | 0 refills | Status: AC

## 2018-06-11 MED ORDER — TRAZODONE HCL 50 MG PO TABS: 50.0000 mg | ORAL_TABLET | Freq: Every day | ORAL | Status: AC

## 2018-06-11 MED ORDER — LACOSAMIDE 200 MG PO TABS: 200.0000 mg | ORAL_TABLET | Freq: Two times a day (BID) | ORAL | 0 refills | Status: AC

## 2018-06-11 MED ORDER — TAMSULOSIN HCL 0.4 MG PO CAPS: 0.4000 mg | ORAL_CAPSULE | Freq: Every day | ORAL | Status: AC

## 2018-06-11 MED ORDER — PANTOPRAZOLE SODIUM 40 MG PO TBEC: 40.0000 mg | DELAYED_RELEASE_TABLET | Freq: Every day | ORAL | Status: AC

## 2018-06-11 MED ORDER — METOPROLOL TARTRATE 37.5 MG PO TABS: 37.5000 mg | ORAL_TABLET | Freq: Two times a day (BID) | ORAL | Status: DC

## 2018-06-11 MED ORDER — SENNA 8.6 MG PO TABS: 2.0000 | ORAL_TABLET | Freq: Every day | ORAL | 0 refills | Status: DC

## 2018-06-11 MED ORDER — QUETIAPINE FUMARATE 25 MG PO TABS: 12.5000 mg | ORAL_TABLET | Freq: Two times a day (BID) | ORAL | Status: DC | PRN

## 2018-06-11 MED ORDER — MELATONIN 3 MG PO TABS: 6.0000 mg | ORAL_TABLET | Freq: Every day | ORAL | 0 refills | Status: AC

## 2018-06-11 MED ORDER — POLYETHYLENE GLYCOL 3350 17 G PO PACK: 17.0000 g | PACK | Freq: Every day | ORAL | 0 refills | Status: AC

## 2018-06-11 MED ORDER — APIXABAN 5 MG PO TABS: 5.0000 mg | ORAL_TABLET | Freq: Two times a day (BID) | ORAL | Status: AC

## 2018-06-11 NOTE — Progress Notes (Signed)
Social Work  Discharge Note  The overall goal for the admission was met for:   Discharge location: No - plan changed to SNF  Length of Stay: No - 27 days - extended slightly due to change in d/c plan and SNF bed search process  Discharge activity level: Yes - supervision/ min assist  Home/community participation: No  Services provided included: MD, RD, PT, OT, SLP, RN, Pharmacy and SW  Financial Services: Medicare  Follow-up services arranged: Other: SNF at [place] (or additional information):  Patient/Family verbalized understanding of follow-up arrangements: Yes  Individual responsible for coordination of the follow-up plan: daughter  Confirmed correct DME delivered: NA Khoa Opdahl

## 2018-06-11 NOTE — Progress Notes (Signed)
Pt is more calmer at this time but still wants to get out of bed will try to keep patient from trying to get out of bed to prevent him from falling. Telesitter in room, personal sitter at bedside, fall mats are in place, bed in low position, bed alarm is on, room is clutter free.

## 2018-06-11 NOTE — Progress Notes (Signed)
Patient discharged to [facility], transported via ambulance. Report called.

## 2018-06-11 NOTE — Progress Notes (Signed)
Physical Therapy Session Note  Patient Details  Name: [name] MRN: [id number] Date of Birth: 1941-02-09  Today's Date: 06/11/2018 PT Individual Time: 1030-1130 PT Individual Time Calculation (min): 60 min   Short Term Goals: Week 3:  PT Short Term Goal 1 (Week 3): = LTG Week 4:  PT Short Term Goal 1 (Week 4): = LTG      Skilled Therapeutic Interventions/Progress Updates:   Pt resting in bed, with Phylis Bougie with him.  When this PT introduced herself to pt, he said "well hello, Chrys Racer, how do you do?"  Pt was cooperative and attended to activities for 3-5 minutes this session.   Bed mobility to sit up and come to standing with supervision.  Gait without AD in room to w/c, with CGA.  Gait x 40' in gym with CGA, no AD.    neuromuscular re-education via forced use, multimodal cues for alternating reciprocal movement bil LEs seated in w/c, using KInetron at level 40 cm/sec.  Gait training up/down 12 steps 2 rails using self selected step-through method, with min> mod assist for safety in order to place his entire R foot onto tread.  Pt rested in wc, performing R/L ankle pumps and circles with demo.  He repeated 12 steps 2 rails with slightly better position of R foot onto tread.  PT assisted pt in donning gloves.  In standing, kneeling and quadruped, he used R hand, primarily, to wipe down mat with cleaning wipes.  Pt indicated that he needed to use toilet.  Toilet transfer in room with CGA.  Pt continent of bowel. Max assist to doff slightly soiled diaper, peri care and don clean diaper.  Pt pullled up pants with min assist.  Hand hygiene at sink with set up and max visual cues.    Pt left resting in w/c, set up for lunch, with Phylis Bougie in room.   Therapy Documentation Precautions:  Precautions Precautions: Fall Precaution Comments: Impulsive Restrictions Weight Bearing Restrictions: No Pain: pt denies     Therapy/Group: Individual Therapy  [name] 06/11/2018,  12:38 PM

## 2018-06-11 NOTE — Progress Notes (Signed)
[facility] PHYSICAL MEDICINE & REHABILITATION PROGRESS NOTE   Subjective/Complaints: Patient seen being wheeled around in his wheelchair in the hallways.  No reported issues overnight.  ROS: Limited due to cognition  Objective:   No results found. Recent Labs    06/10/18 1011  WBC 4.5  HGB 11.8*  HCT 37.4*  PLT 182   Recent Labs    06/10/18 1011  NA 139  K 4.3  CL 107  CO2 26  GLUCOSE 115*  BUN 17  CREATININE 0.89  CALCIUM 8.9    Intake/Output Summary (Last 24 hours) at 06/11/2018 1312 Last data filed at 06/11/2018 0825 Gross per 24 hour  Intake 580 ml  Output 725 ml  Net -145 ml     Physical Exam: Vital Signs Blood pressure 124/72, pulse 74, temperature 97.7 F (36.5 C), temperature source Oral, resp. rate 17, height 6' (1.829 m), weight 69.5 kg, SpO2 97 %. Constitutional: No distress . Vital signs reviewed. HENT: Normocephalic.  Atraumatic. Eyes: EOMI. No discharge. Cardiovascular: RRR.  No JVD. Respiratory: CTA bilaterally.  Normal effort. GI: BS +. Non-distended. Musc: No edema or tenderness in extremities. Neurological: Alert and moving all 4's without difficulty.  Global aphasia Skin: He is not diaphoretic.   Warm and dry.  Intact. Psych: pleasant  Assessment/Plan: 1. Functional deficits secondary to TBI which require 3+ hours per day of interdisciplinary therapy in a comprehensive inpatient rehab setting.  Physiatrist is providing close team supervision and 24 hour management of active medical problems listed below.  Physiatrist and rehab team continue to assess barriers to discharge/monitor patient progress toward functional and medical goals  Care Tool:  Bathing  Bathing activity did not occur: Refused Body parts bathed by patient: Chest, Abdomen, Right arm, Left arm, Front perineal area, Buttocks, Face, Right upper leg, Right lower leg, Left upper leg, Left lower leg   Body parts bathed by helper: Buttocks, Front perineal area, Chest,  Right arm, Left arm     Bathing assist Assist Level: Supervision/Verbal cueing     Upper Body Dressing/Undressing Upper body dressing   What is the patient wearing?: Pull over shirt    Upper body assist Assist Level: Supervision/Verbal cueing    Lower Body Dressing/Undressing Lower body dressing      What is the patient wearing?: Underwear/pull up, Pants     Lower body assist Assist for lower body dressing: Contact Guard/Touching assist     Toileting Toileting    Toileting assist Assist for toileting: Maximal Assistance - Patient 25 - 49%     Transfers Chair/bed transfer  Transfers assist     Chair/bed transfer assist level: Contact Guard/Touching assist     Locomotion Ambulation   Ambulation assist      Assist level: Contact Guard/Touching assist Assistive device: Other (comment)(none ) Max distance: 200   Walk 10 feet activity   Assist     Assist level: Contact Guard/Touching assist Assistive device: Other (comment)(none )   Walk 50 feet activity   Assist Walk 50 feet with 2 turns activity did not occur: Safety/medical concerns  Assist level: Contact Guard/Touching assist Assistive device: Other (comment)(none )    Walk 150 feet activity   Assist Walk 150 feet activity did not occur: Safety/medical concerns  Assist level: Contact Guard/Touching assist Assistive device: Other (comment)(none )    Walk 10 feet on uneven surface  activity   Assist Walk 10 feet on uneven surfaces activity did not occur: Safety/medical concerns   Assist level: Minimal Assistance -  Patient > 75%     Wheelchair     Assist Will patient use wheelchair at discharge?: Yes Type of Wheelchair: Manual    Wheelchair assist level: Dependent - Patient 0%      Wheelchair 50 feet with 2 turns activity    Assist        Assist Level: Dependent - Patient 0%   Wheelchair 150 feet activity     Assist          Medical Problem List and  Plan: 1.  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  DVT Prophylaxis/Anticoagulation: Pharmaceutical: Other (comment)--Eliquis 3. Pain Management: tylenol prn.  4. Mood: LCSW to follow for evaluation and support.  5. Neuropsych: This patient is not capable of making decisions on his own behalf. 6. Skin/Wound Care: Venous pressure relief measures.  Moisturization as needed recurrent seborrhea 7. Fluids/Electrolytes/Nutrition:    BMP was acceptable range on 11/26 8. CAD with ICM/CHF/ICD: Monitor for symptoms with activity.  Monitor for signs of overload and check weights daily.  Continue aspirin and metoprolol  Filed Weights   06/05/18 0447 06/06/18 0426 06/09/18 0652  Weight: 68.6 kg 69.2 kg 69.5 kg     -weights  Stable on 11/27 9. A fib with RVR: Monitor heart rate twice daily.  Continue metoprolol twice daily. 10.  Urinary retention:      -UTI rx'ed, voiding without issues 11.  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  Bouts of agitation/sundowning:      -continue trazodone 50mg  at 2100  -seroquel in late afternoon and again at night for sundowning/sleep  -improved sleep pattern overall   13.  History of Barrett's esophagus with dysphagia: Continue PPI 14.  Seizures?/ At risk for seizures: On Vimpat twice daily 15. OSA: CPAP 16.  Acute blood loss anemia  Hemoglobin 11.8 on 11/26    LOS: 27 days A FACE TO FACE EVALUATION WAS PERFORMED  [name] [name] [name] 06/11/2018, 1:12 PM

## 2018-06-11 NOTE — Progress Notes (Signed)
Pt is calm and cooperative at this time. Pt is urinating but not fully emptying his bladder per bladder scanner. In and out cath performed. Pt tolerated well.

## 2018-06-11 NOTE — Progress Notes (Signed)
At beginning of shift (1900-1930), pt is trying out of bed with nursing staff by his side.  Pt refuses to go back to bed. Pt is restless at this time. Pt safley escorted back to bed. Telesitter at bedside, bed alarm on, bed in low position, family at bedside. Will continue to monitor patient.

## 2018-06-11 NOTE — Progress Notes (Signed)
Speech Language Pathology Discharge Summary  Patient Details  Name: [name] MRN: [id number] Date of Birth: 11-05-1940   Patient has met 6 of 8 long term goals.  Patient to discharge at overall Mod;Max level.   Reasons goals not met: Patient continues to require overall Max-Total A for problem solving and expression of wants/needs    Clinical Impression/Discharge Summary: Patient has made functional but inconsistent gains and has met 6 of 8 LTGs this admission. Currently, patient demonstrates behaviors consistent with a Rancho Level VI and requires overall Mod A multimodal cues for sustained attention and Max A multimodal cues for orientation, problem solving, awareness and recall which impacts his safety with functional and familiar tasks. Patient also demonstrates a severe aphasia impacting all four modalities of language. Patient demonstrates reduced yes/no accuracy and verbal expression consists of perseveration, jargon, neologisms and minimal awareness of errors. Patient is consuming regular textures with thin liquids without overt s/s of aspiration but requires Min A verbal cues for use of small bites/sips and a slow rate of self-feeding. Patient and family education complete. [name] family is unable to provide the necessary physical and cognitive assistance needed at this time, therefore, patient will discharge to a SNF. Patient would benefit from f/u SLP services to maximize his cognitive-linguistic function and overall functional independence in order to reduce caregiver burden.   Care Partner:  Caregiver Able to Provide Assistance: No  Type of Caregiver Assistance: Physical;Cognitive  Recommendation:  24 hour supervision/assistance;Skilled Nursing facility  Rationale for SLP Follow Up: Maximize functional communication;Maximize cognitive function and independence;Reduce caregiver burden   Equipment: N/A    Reasons for discharge: Discharged from hospital   Patient/Family  Agrees with Progress Made and Goals Achieved: Yes    [place], [place] 06/11/2018, 6:34 AM

## 2018-06-11 NOTE — Progress Notes (Signed)
Pt is very agitated, restless, and combative. Pt wants to get out of bed. Pt will not listen to staff. Pt's personal sitter is also at bedside. Pt will not listen to personal sitter as well. Pt was able to get in wheel chair but trys to get out of wheel chair. To calm pt down pt was taken in wheel chair around the unit. Pt at this time will not take any medications for agitation. Due to the patient's agitation Im unable to bladder scan pt and do a in and out if needed. Will continue to try to calm patient down.

## 2018-06-13 ENCOUNTER — Inpatient Hospital Stay (HOSPITAL_COMMUNITY): Payer: Medicare Other | Admitting: Physical Therapy

## 2018-06-26 ENCOUNTER — Other Ambulatory Visit: Payer: Self-pay

## 2018-06-26 ENCOUNTER — Emergency Department (HOSPITAL_COMMUNITY): Payer: Medicare Other

## 2018-06-26 ENCOUNTER — Emergency Department (HOSPITAL_COMMUNITY)
Admission: EM | Admit: 2018-06-26 | Discharge: 2018-06-26 | Disposition: A | Discharge status: Home / Self Care | Payer: Medicare Other | Attending: Emergency Medicine | Admitting: Emergency Medicine

## 2018-06-26 DIAGNOSIS — Y999 Unspecified external cause status: Secondary | ICD-10-CM | POA: Insufficient documentation

## 2018-06-26 DIAGNOSIS — Z85828 Personal history of other malignant neoplasm of skin: Secondary | ICD-10-CM | POA: Diagnosis not present

## 2018-06-26 DIAGNOSIS — S0993XA Unspecified injury of face, initial encounter: Secondary | ICD-10-CM | POA: Diagnosis present

## 2018-06-26 DIAGNOSIS — Z87891 Personal history of nicotine dependence: Secondary | ICD-10-CM | POA: Diagnosis not present

## 2018-06-26 DIAGNOSIS — Z95810 Presence of automatic (implantable) cardiac defibrillator: Secondary | ICD-10-CM | POA: Diagnosis not present

## 2018-06-26 DIAGNOSIS — Z79899 Other long term (current) drug therapy: Secondary | ICD-10-CM | POA: Diagnosis not present

## 2018-06-26 DIAGNOSIS — D320 Benign neoplasm of cerebral meninges: Secondary | ICD-10-CM | POA: Insufficient documentation

## 2018-06-26 DIAGNOSIS — S0083XA Contusion of other part of head, initial encounter: Secondary | ICD-10-CM | POA: Diagnosis not present

## 2018-06-26 DIAGNOSIS — I110 Hypertensive heart disease with heart failure: Secondary | ICD-10-CM | POA: Diagnosis not present

## 2018-06-26 DIAGNOSIS — Z955 Presence of coronary angioplasty implant and graft: Secondary | ICD-10-CM | POA: Insufficient documentation

## 2018-06-26 DIAGNOSIS — W010XXA Fall on same level from slipping, tripping and stumbling without subsequent striking against object, initial encounter: Secondary | ICD-10-CM | POA: Insufficient documentation

## 2018-06-26 DIAGNOSIS — Z96652 Presence of left artificial knee joint: Secondary | ICD-10-CM | POA: Insufficient documentation

## 2018-06-26 DIAGNOSIS — Z7982 Long term (current) use of aspirin: Secondary | ICD-10-CM | POA: Insufficient documentation

## 2018-06-26 DIAGNOSIS — J449 Chronic obstructive pulmonary disease, unspecified: Secondary | ICD-10-CM | POA: Diagnosis not present

## 2018-06-26 DIAGNOSIS — Y939 Activity, unspecified: Secondary | ICD-10-CM | POA: Insufficient documentation

## 2018-06-26 DIAGNOSIS — R296 Repeated falls: Secondary | ICD-10-CM | POA: Diagnosis not present

## 2018-06-26 DIAGNOSIS — I2510 Atherosclerotic heart disease of native coronary artery without angina pectoris: Secondary | ICD-10-CM | POA: Diagnosis not present

## 2018-06-26 DIAGNOSIS — I509 Heart failure, unspecified: Secondary | ICD-10-CM | POA: Insufficient documentation

## 2018-06-26 DIAGNOSIS — Z7901 Long term (current) use of anticoagulants: Secondary | ICD-10-CM | POA: Insufficient documentation

## 2018-06-26 DIAGNOSIS — Y92129 Unspecified place in nursing home as the place of occurrence of the external cause: Secondary | ICD-10-CM | POA: Diagnosis not present

## 2018-06-26 NOTE — ED Notes (Signed)
Patient verbalizes understanding of discharge instructions. Opportunity for questioning and answers were provided. Armband removed by staff, pt discharged from ED via wheelchair to home.  

## 2018-06-26 NOTE — ED Provider Notes (Signed)
[facility] EMERGENCY DEPARTMENT Provider Note   CSN: [id number] Arrival date & time: 06/26/18  1612     History   Chief Complaint Chief Complaint  Patient presents with  . Fall    HPI [name] is a 77 y.o. male.  The history is provided by the EMS personnel and the nursing home. No language interpreter was used.  Fall  This is a new problem. The current episode started less than 1 hour ago. The problem has not changed since onset.Nothing aggravates the symptoms. Nothing relieves the symptoms. He has tried nothing for the symptoms.  Pt is in Rehab for frequent falls.  Pt is reported to have tripped and fell forward hitting his head today.  Pt is on eliquis   Past Medical History:  Diagnosis Date  . A-fib ([place])   . AICD (automatic cardioverter/defibrillator) present 05/21/2016  . Arthritis    "back of my neck extending to back of head" (05/21/2016)  . Barrett's esophagus    Gets periodic endoscopies & biopsies. Coumadin has been held in the past for these precedures.   . Basal cell carcinoma    "scalp; chest; back"  . Blurred vision   . CAD (coronary artery disease)    S/P stenting of his LAD, circumflex & marginal branch as well as his RCA in 2008.  . Cardiomyopathy ([place])    Significant ischemic cardiomyopathy. ECHO 08/27/12: EF = 45-50% (Previous Echo revealed EF = 25%)  . Carotid artery occlusion    Carotid Doppler 08/27/12 SUMMARY - Bilateral ICAs: Demonstrated normal patency without evidence of a significant diamenter reduction, tortuosity or any other vascular abnormality. This was a Normal carotid duplex Doppler Evaluation.  . Cerebral atherosclerosis    Carotid Doppler 08/27/12 SUMMARY - Bilateral ICAs: Demonstrated normal patency without evidence of a significant diamenter reduction, tortuosity or any other vascular abnormality. This was a Normal carotid duplex Doppler Evaluation.  . CHF (congestive heart failure) ([place])   . Chronic  anticoagulation    For PE & PAF  . Claudication ([place])    right ABI of 0.84  . Coronary artery disease    Most recent cath by Dr. Gwenlyn Found 07/02/09 was remarkable for patent stents w/an 80% in-stent stenosis within the obtuse marginal branch stent, as well as 60% ostial RCA stenosis which was stented with a Taxus Liberte drug-eluting stent. Nuc stress test 09/03/12 was low risk & showed an inferolateral scar. & an EF of 39%  . Daily headache    "from the arthritis in back of neck" (05/21/2016)  . DVT (deep venous thrombosis) ([place]) 09/2010   "after knee replacement"  . GERD (gastroesophageal reflux disease)   . H/O mitral valve disorder   . History of hiatal hernia   . History of kidney stones   . History of stomach ulcers   . Hyperlipidemia   . Hypertension   . Meningioma ([place]) 08/15/12   BrainLAB-guided left frontoparietal craniotomy w/removal of a meningioma.   . Obstructive sleep apnea    Sleep Study in 2011 AHI 3.2. "occasionally wear my mask" (05/21/2016)  . Paroxysmal atrial fibrillation (H[facility])   . PSVT (paroxysmal supraventricular tachycardia) ([place])    2D ECHO 08/27/12: EF = 45-50% (Previous Echo revealed EF = 25%)  . Pulmonary embolism ([place]) 09/2010   "after knee replacement"  . Shortness of breath dyspnea   . Sick sinus syndrome ([place])   . TIA (transient ischemic attack)     Patient Active Problem List  Diagnosis Date Noted  . Seizures ([place])   . Acute blood loss anemia   . Chronic congestive heart failure ([place])   . 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 ICD (implantable cardioverter-defibrillator) in place 05/21/2016  . 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 Claudication of right lower extremity ([place]) 10/04/2015  . Preop cardiovascular exam 11/16/2014  . Palpitations 08/13/2013  . Chronic atrial fibrillation ([place]) 03/26/2013  . Hyperlipidemia 03/26/2013  . Barrett's esophagus 03/26/2013  . Obstructive sleep apnea 03/26/2013  . Cardiomyopathy, ischemic  12/19/2012  . HTN (hypertension) 12/19/2012  . Hx pulmonary embolism 12/19/2012  . NSVT (nonsustained ventricular tachycardia) ([place]) 10/23/2012  . CAD S/P multiple PCIs 10/23/2012  . PSVT (paroxysmal supraventricular tachycardia) ([place]) 09/30/2012  . Long term (current) use of anticoagulants 09/30/2012    Past Surgical History:  Procedure Laterality Date  . BASAL CELL CARCINOMA EXCISION     "head X 2; back, chest" (05/21/2016)  . CARDIAC CATHETERIZATION     Past caths in 2008, 2006, 2005 & 2003  . CATARACT EXTRACTION W/ INTRAOCULAR LENS  IMPLANT, BILATERAL Bilateral 2005  . CORONARY ANGIOPLASTY WITH STENT PLACEMENT  07/02/09   Most recent cath by Dr. Gwenlyn Found 07/02/09 was remarkable for patent stents w/an 80% in-stent stenosis within the obtuse marginal branch stent, as well as 60% ostial RCA stenosis which was stented with a Taxus Liberte drug-eluting stent.   . CORONARY ANGIOPLASTY WITH STENT PLACEMENT     "I have 12 stents in my heart" (05/21/2016)  . CRANIOTOMY  08/15/2012   At [facility]. BrainLAB-guided left frontoparietal craniotomy w/removal of a meningioma.   . EP IMPLANTABLE DEVICE N/A 05/21/2016   Procedure: ICD Implant;  Surgeon: Evans Lance, MD;  Location: [facility];  Service: Cardiovascular;  Laterality: N/A;  . INGUINAL HERNIA REPAIR Right 11/23/2014   Procedure: HERNIA REPAIR INGUINAL ADULT;  Surgeon: Rochel Brome, MD;  Location: [facility] ORS;  Service: General;  Laterality: Right;  . INGUINAL HERNIA REPAIR Bilateral 2010-2016  . JOINT REPLACEMENT    . THROAT SURGERY     "laser for Barret's esophagus"  . TONSILLECTOMY    . TOTAL KNEE ARTHROPLASTY Left 09/2010   Surgery was complicated by PAF with a pulmonary embolism documented by CT angiogram.        Home Medications    Prior to Admission medications   Medication Sig Start Date End Date Taking? Authorizing Provider  acetaminophen (TYLENOL) 500 MG tablet Take 500 mg by mouth every 6 (six) hours as needed (for  pain.).    Yes [provider]  apixaban (ELIQUIS) 5 MG TABS tablet Take 1 tablet (5 mg total) by mouth 2 (two) times daily. 06/11/18  Yes Love, Ivan Anchors, PA-C  aspirin EC 81 MG tablet Take 81 mg by mouth daily.   Yes [provider]  finasteride (PROSCAR) 5 MG tablet Take 1 tablet (5 mg total) by mouth at bedtime. 06/11/18  Yes Love, Ivan Anchors, PA-C  lacosamide (VIMPAT) 200 MG TABS tablet Take 1 tablet (200 mg total) by mouth 2 (two) times daily. 06/11/18  Yes Love, Ivan Anchors, PA-C  Melatonin 3 MG TABS Take 2 tablets (6 mg total) by mouth at bedtime. 06/11/18  Yes Love, Ivan Anchors, PA-C  metoprolol tartrate (LOPRESSOR) 25 MG tablet Take 37.5 mg by mouth 2 (two) times daily.   Yes [provider]  pantoprazole (PROTONIX) 40 MG tablet Take 1 tablet (40 mg total) by mouth daily. 06/11/18  Yes Love, Ivan Anchors,  PA-C  polyethylene glycol (MIRALAX / GLYCOLAX) packet Take 17 g by mouth daily. 06/11/18  Yes Love, Ivan Anchors, PA-C  polyvinyl alcohol (LIQUIFILM TEARS) 1.4 % ophthalmic solution Place 1 drop into both eyes as needed for dry eyes.    Yes [provider]  QUEtiapine (SEROQUEL) 25 MG tablet Take 0.5 tablets (12.5 mg total) by mouth 2 (two) times daily as needed (agitation). 06/11/18  Yes Love, Ivan Anchors, PA-C  QUEtiapine (SEROQUEL) 25 MG tablet Take 0.5 tablets (12.5 mg total) by mouth 2 (two) times daily. At 4 pm and 10 pm as well as prn. 06/11/18  Yes Love, Ivan Anchors, PA-C  senna (SENOKOT) 8.6 MG TABS tablet Take 2 tablets (17.2 mg total) by mouth at bedtime. 06/11/18  Yes Love, Ivan Anchors, PA-C  tamsulosin (FLOMAX) 0.4 MG CAPS capsule Take 1 capsule (0.4 mg total) by mouth daily after supper. 06/11/18  Yes Love, Ivan Anchors, PA-C  traZODone (DESYREL) 50 MG tablet Take 1 tablet (50 mg total) by mouth at bedtime. 06/11/18  Yes Love, Ivan Anchors, PA-C  vitamin B-12 (CYANOCOBALAMIN) 1000 MCG tablet Take 1,000 mcg by mouth daily.   Yes [provider]  docusate sodium  (COLACE) 100 MG capsule Take 1 capsule (100 mg total) by mouth daily. Patient not taking: Reported on 06/26/2018 06/11/18   Love, Ivan Anchors, PA-C  Metoprolol Tartrate 37.5 MG TABS Take 37.5 mg by mouth 2 (two) times daily. Patient not taking: Reported on 06/26/2018 06/11/18   Love, Ivan Anchors, PA-C    Family History Family History  Problem Relation Age of Onset  . Cancer Mother   . Cardiomyopathy Sister   . Cancer Other        Breast    Social History Social History   Tobacco Use  . Smoking status: Former Smoker    Packs/day: 1.00    Years: 35.00    Pack years: 35.00    Types: Cigarettes    Last attempt to quit: 1980    Years since quitting: 39.9  . Smokeless tobacco: Former Systems developer    Quit date: 1989  Substance Use Topics  . Alcohol use: Yes    Alcohol/week: 28.0 standard drinks    Types: 28 Cans of beer per week    Comment: 4 beers per day  . Drug use: No     Allergies   Patient has no known allergies.   Review of Systems Review of Systems  All other systems reviewed and are negative.    Physical Exam Updated Vital Signs BP (!) 154/78 (BP Location: Right Arm)   Pulse 98   Temp 97.7 F (36.5 C) (Oral)   Resp (!) 22   SpO2 100%   Physical Exam Vitals signs and nursing note reviewed.  Constitutional:      Appearance: Normal appearance.  HENT:     Head: Normocephalic.     Right Ear: Tympanic membrane normal.     Left Ear: Tympanic membrane normal.     Nose: Nose normal.     Mouth/Throat:     Mouth: Mucous membranes are moist.  Eyes:     Pupils: Pupils are equal, round, and reactive to light.  Neck:     Musculoskeletal: Normal range of motion.  Cardiovascular:     Rate and Rhythm: Normal rate.     Pulses: Normal pulses.  Pulmonary:     Effort: Pulmonary effort is normal.  Abdominal:     General: Abdomen is flat.  Musculoskeletal: Normal range of  motion.  Skin:    General: Skin is warm.  Neurological:     General: No focal deficit present.      Mental Status: He is alert. Mental status is at baseline.     Cranial Nerves: No cranial nerve deficit.  Psychiatric:        Mood and Affect: Mood normal.      ED Treatments / Results  Labs (all labs ordered are listed, but only abnormal results are displayed) Labs Reviewed - No data to display  EKG None  Radiology Ct Head Wo Contrast  Result Date: 06/26/2018 CLINICAL DATA:  Multiple falls recently. Hit head on a bed rail today. On Eliquis. EXAM: CT HEAD WITHOUT CONTRAST CT CERVICAL SPINE WITHOUT CONTRAST TECHNIQUE: Multidetector CT imaging of the head and cervical spine was performed following the standard protocol without intravenous contrast. Multiplanar CT image reconstructions of the cervical spine were also generated. COMPARISON:  CT head dated March 08, 2018. CT cervical spine dated December 25, 2017. FINDINGS: CT HEAD FINDINGS Brain: No evidence of acute infarction, hemorrhage, hydrocephalus, extra-axial collection or mass lesion/mass effect. Expected evolution of previously seen hemorrhagic contusions with encephalomalacia in the left greater than right anterior inferior frontal lobes and anterior temporal lobes. Encephalomalacia in the peripheral posterior left frontal and anterior left parietal lobes at the site of prior subarachnoid hemorrhage. Progressive ex vacuo dilatation of the lateral ventricles. Vascular: Calcified atherosclerosis at the skullbase. No hyperdense vessel. Skull: Prior left parietal craniotomy. Nearly healed nondisplaced left parietal skull fracture. No acute fracture or focal lesion. Sinuses/Orbits: No acute finding. Other: None. CT CERVICAL SPINE FINDINGS Alignment: Facet mediated 2 mm anterolisthesis at C4-C5 and C7-T1, unchanged. Skull base and vertebrae: No acute fracture. No primary bone lesion or focal pathologic process. Soft tissues and spinal canal: No prevertebral fluid or swelling. No visible canal hematoma. Disc levels: Moderate to severe disc height  loss at C6-C7, unchanged. Mild disc height loss at the remaining cervical levels. Multilevel moderate to severe facet arthropathy, greater on the left, similar to prior study. Upper chest: Negative. Other: Bilateral carotid artery calcific atherosclerosis. IMPRESSION: 1.  No acute intracranial abnormality. 2. Expected evolution of previously seen hemorrhagic contusions with bilateral cerebral hemisphere encephalomalacia as described above. 3. No acute cervical spine fracture. Unchanged multilevel cervical spondylosis. Electronically Signed   By: Titus Dubin M.D.   On: 06/26/2018 17:42   Ct Cervical Spine Wo Contrast  Result Date: 06/26/2018 CLINICAL DATA:  Multiple falls recently. Hit head on a bed rail today. On Eliquis. EXAM: CT HEAD WITHOUT CONTRAST CT CERVICAL SPINE WITHOUT CONTRAST TECHNIQUE: Multidetector CT imaging of the head and cervical spine was performed following the standard protocol without intravenous contrast. Multiplanar CT image reconstructions of the cervical spine were also generated. COMPARISON:  CT head dated March 08, 2018. CT cervical spine dated December 25, 2017. FINDINGS: CT HEAD FINDINGS Brain: No evidence of acute infarction, hemorrhage, hydrocephalus, extra-axial collection or mass lesion/mass effect. Expected evolution of previously seen hemorrhagic contusions with encephalomalacia in the left greater than right anterior inferior frontal lobes and anterior temporal lobes. Encephalomalacia in the peripheral posterior left frontal and anterior left parietal lobes at the site of prior subarachnoid hemorrhage. Progressive ex vacuo dilatation of the lateral ventricles. Vascular: Calcified atherosclerosis at the skullbase. No hyperdense vessel. Skull: Prior left parietal craniotomy. Nearly healed nondisplaced left parietal skull fracture. No acute fracture or focal lesion. Sinuses/Orbits: No acute finding. Other: None. CT CERVICAL SPINE FINDINGS Alignment: Facet mediated 2 mm  anterolisthesis at C4-C5 and C7-T1, unchanged. Skull base and vertebrae: No acute fracture. No primary bone lesion or focal pathologic process. Soft tissues and spinal canal: No prevertebral fluid or swelling. No visible canal hematoma. Disc levels: Moderate to severe disc height loss at C6-C7, unchanged. Mild disc height loss at the remaining cervical levels. Multilevel moderate to severe facet arthropathy, greater on the left, similar to prior study. Upper chest: Negative. Other: Bilateral carotid artery calcific atherosclerosis. IMPRESSION: 1.  No acute intracranial abnormality. 2. Expected evolution of previously seen hemorrhagic contusions with bilateral cerebral hemisphere encephalomalacia as described above. 3. No acute cervical spine fracture. Unchanged multilevel cervical spondylosis. Electronically Signed   By: Titus Dubin M.D.   On: 06/26/2018 17:42    Procedures Procedures (including critical care time)  Medications Ordered in ED Medications - No data to display   Initial Impression / Assessment and Plan / ED Course  I have reviewed the triage vital signs and the nursing notes.  Pertinent labs & imaging results that were available during my care of the patient were reviewed by me and considered in my medical decision making (see chart for details).     MDM Ct head and Ct cspine shows no acute change.    Final Clinical Impressions(s) / ED Diagnoses   Final diagnoses:  Contusion of face, initial encounter    ED Discharge Orders    None    An After Visit Summary was printed and given to the patient.    Fransico Meadow, PA-C 06/26/18 1805    Gareth Morgan, MD 06/28/18 [phone]

## 2018-06-26 NOTE — Discharge Instructions (Signed)
Return if any problems.

## 2018-06-26 NOTE — ED Triage Notes (Signed)
Pt to ED via EMS from [facility] for rehab with multiple falls over the last few days unwitnessed. Today had witnessed fall and hit head on bed rail. Pt on Eliquis for afib. Pt at baseline mentation. Denies pain. C collar in place. Hx TIA.

## 2018-07-01 ENCOUNTER — Ambulatory Visit (INDEPENDENT_AMBULATORY_CARE_PROVIDER_SITE_OTHER): Payer: Medicare Other

## 2018-07-01 DIAGNOSIS — I255 Ischemic cardiomyopathy: Secondary | ICD-10-CM

## 2018-07-02 ENCOUNTER — Encounter: Payer: Self-pay | Admitting: Cardiology

## 2018-07-02 ENCOUNTER — Ambulatory Visit: Payer: Medicare Other | Attending: Physician Assistant

## 2018-07-02 DIAGNOSIS — R2689 Other abnormalities of gait and mobility: Secondary | ICD-10-CM | POA: Insufficient documentation

## 2018-07-02 DIAGNOSIS — M6281 Muscle weakness (generalized): Secondary | ICD-10-CM | POA: Diagnosis present

## 2018-07-02 DIAGNOSIS — R2681 Unsteadiness on feet: Secondary | ICD-10-CM | POA: Insufficient documentation

## 2018-07-02 DIAGNOSIS — R4701 Aphasia: Secondary | ICD-10-CM | POA: Insufficient documentation

## 2018-07-02 DIAGNOSIS — R41841 Cognitive communication deficit: Secondary | ICD-10-CM | POA: Diagnosis present

## 2018-07-02 NOTE — Progress Notes (Signed)
Remote ICD transmission.   

## 2018-07-02 NOTE — Patient Instructions (Signed)
  Access Code: [email or web address]  URL: [email or web address]  Date: 07/02/2018  Prepared by: Janna Arch   Exercises  Seated March - 10 reps - 1 sets - 5 hold - 1x daily - 7x weekly  Seated Long Arc Quad - 10 reps - 1 sets - 5 hold - 1x daily - 7x weekly  Seated Heel Toe Raises - 10 reps - 1 sets - 5 hold - 1x daily - 7x weekly  Sit to stand with walker - 5 reps - 1 sets - 5 hold - 1x daily - 7x weekly

## 2018-07-02 NOTE — Therapy (Signed)
[place] MAIN [facility] SERVICES [address] [place], [place], [address] Phone: [phone]   Fax:  [phone]  Physical Therapy Evaluation  Patient Details  Name: [name] MRN: [id number] Date of Birth: 28-Dec-1940 Referring Provider (PT): Lavon Paganini Anguilini   Encounter Date: 07/02/2018  PT End of Session - 07/02/18 1618    Visit Number  1    Number of Visits  16    Date for PT Re-Evaluation  08/27/18    Authorization Type  1/10 eval 12/18    PT Start Time  1015    PT Stop Time  1111    PT Time Calculation (min)  56 min    Equipment Utilized During Treatment  Gait belt    Activity Tolerance  Treatment limited secondary to medical complications (Comment);Other (comment)   cognitive status   Behavior During Therapy  Impulsive;Flat affect       Past Medical History:  Diagnosis Date  . A-fib ([place])   . AICD (automatic cardioverter/defibrillator) present 05/21/2016  . Arthritis    "back of my neck extending to back of head" (05/21/2016)  . Barrett's esophagus    Gets periodic endoscopies & biopsies. Coumadin has been held in the past for these precedures.   . Basal cell carcinoma    "scalp; chest; back"  . Blurred vision   . CAD (coronary artery disease)    S/P stenting of his LAD, circumflex & marginal branch as well as his RCA in 2008.  . Cardiomyopathy ([place])    Significant ischemic cardiomyopathy. ECHO 08/27/12: EF = 45-50% (Previous Echo revealed EF = 25%)  . Carotid artery occlusion    Carotid Doppler 08/27/12 SUMMARY - Bilateral ICAs: Demonstrated normal patency without evidence of a significant diamenter reduction, tortuosity or any other vascular abnormality. This was a Normal carotid duplex Doppler Evaluation.  . Cerebral atherosclerosis    Carotid Doppler 08/27/12 SUMMARY - Bilateral ICAs: Demonstrated normal patency without evidence of a significant diamenter reduction, tortuosity or any other vascular abnormality. This was  a Normal carotid duplex Doppler Evaluation.  . CHF (congestive heart failure) ([place])   . Chronic anticoagulation    For PE & PAF  . Claudication ([place])    right ABI of 0.84  . Coronary artery disease    Most recent cath by Dr. Gwenlyn Found 07/02/09 was remarkable for patent stents w/an 80% in-stent stenosis within the obtuse marginal branch stent, as well as 60% ostial RCA stenosis which was stented with a Taxus Liberte drug-eluting stent. Nuc stress test 09/03/12 was low risk & showed an inferolateral scar. & an EF of 39%  . Daily headache    "from the arthritis in back of neck" (05/21/2016)  . DVT (deep venous thrombosis) ([place]) 09/2010   "after knee replacement"  . GERD (gastroesophageal reflux disease)   . H/O mitral valve disorder   . History of hiatal hernia   . History of kidney stones   . History of stomach ulcers   . Hyperlipidemia   . Hypertension   . Meningioma ([place]) 08/15/12   BrainLAB-guided left frontoparietal craniotomy w/removal of a meningioma.   . Obstructive sleep apnea    Sleep Study in 2011 AHI 3.2. "occasionally wear my mask" (05/21/2016)  . Paroxysmal atrial fibrillation ([facility])   . PSVT (paroxysmal supraventricular tachycardia) ([place])    2D ECHO 08/27/12: EF = 45-50% (Previous Echo revealed EF = 25%)  . Pulmonary embolism ([place]) 09/2010   "after knee replacement"  . Shortness  of breath dyspnea   . Sick sinus syndrome ([place])   . TIA (transient ischemic attack)     Past Surgical History:  Procedure Laterality Date  . BASAL CELL CARCINOMA EXCISION     "head X 2; back, chest" (05/21/2016)  . CARDIAC CATHETERIZATION     Past caths in 2008, 2006, 2005 & 2003  . CATARACT EXTRACTION W/ INTRAOCULAR LENS  IMPLANT, BILATERAL Bilateral 2005  . CORONARY ANGIOPLASTY WITH STENT PLACEMENT  07/02/09   Most recent cath by Dr. Gwenlyn Found 07/02/09 was remarkable for patent stents w/an 80% in-stent stenosis within the obtuse marginal branch stent, as well as 60% ostial RCA stenosis which was  stented with a Taxus Liberte drug-eluting stent.   . CORONARY ANGIOPLASTY WITH STENT PLACEMENT     "I have 12 stents in my heart" (05/21/2016)  . CRANIOTOMY  08/15/2012   At [facility]. BrainLAB-guided left frontoparietal craniotomy w/removal of a meningioma.   . EP IMPLANTABLE DEVICE N/A 05/21/2016   Procedure: ICD Implant;  Surgeon: Evans Lance, MD;  Location: [place] [facility];  Service: Cardiovascular;  Laterality: N/A;  . INGUINAL HERNIA REPAIR Right 11/23/2014   Procedure: HERNIA REPAIR INGUINAL ADULT;  Surgeon: Rochel Brome, MD;  Location: [facility] ORS;  Service: General;  Laterality: Right;  . INGUINAL HERNIA REPAIR Bilateral 2010-2016  . JOINT REPLACEMENT    . THROAT SURGERY     "laser for Barret's esophagus"  . TONSILLECTOMY    . TOTAL KNEE ARTHROPLASTY Left 09/2010   Surgery was complicated by PAF with a pulmonary embolism documented by CT angiogram.    There were no vitals filed for this visit.    Subjective Assessment - 07/02/18 1021    Subjective  Patient is a pleasant 77 year old male who presents s/p TBI with fiance and daughter who assist with majority of the history due to patient's inconsistant ability to follow commands/conversation.    Pertinent History  Patient is a pleasant 77 year old male who was admitted to hospital on 03/08/18 after TBI post fall that was treated with EVD. He had a prolonged hospitalization that required a PEG tube for nutrionary support and foley. He was then sent to [facility] on 04/03/18 and had an episode on 04/23/18 where he was found unresponsive and taken to the hospital. Upon this stay patient had significant issues per physician reports with orthostasis and exertional tachyarrhytmias due to A fib with  RVR.  Patient was transferred to CIR on 05/15/18 for PT, ST, and OT at least 3 hours, 5 days a week.  Patient discharged from [facility] and [facility] on 12/17. Extensive TBI rehab program performed. Prior to hospitalization patient was independent  with mobility and lived with significant other in home.  Patient was in rehab for frequent falls, is on elliquis. PMH includes: A fib, AICD, arthritis, Barrett's esophagus, Basal cell carcinoma, blurred vision, CAD, Cardiomyopathy, carotid artery occlusion, cerebral atherosclerosis, CHF, claudication, CAD, DVT, GERD, HLD, HTN, Meningioma, PE, TIA, seizures, ICD in place, and COPD.  Has absent seizures: blank stare with longest for hour and half. Patient is at home now and is supposed to use rollator. Fiance reports R leg stiffnes and "locks up on him" causing him to fall.     Limitations  Standing;Walking;House hold activities;Other (comment);Lifting;Sitting    How long can you stand comfortably?  loses his balance as soon as he stands up    How long can you walk comfortably?  loses stability immediately  Patient Stated Goals  improve RLE strength, core, balance    Currently in Pain?  No/denies       Patient is a pleasant 77 year old male who was admitted to hospital on 03/08/18 after TBI post fall that was treated with EVD. He had a prolonged hospitalization that required a PEG tube for nutrionary support and foley. He was then sent to [facility] on 04/03/18 and had an episode on 04/23/18 where he was found unresponsive and taken to the hospital. Upon this stay patient had significant issues per physician reports with orthostasis and exertional tachyarrhytmias due to A fib with  RVR.  Patient was transferred to CIR on 05/15/18 for PT, ST, and OT at least 3 hours, 5 days a week.  Patient discharged from [facility] and [place] on 12/17. Extensive TBI rehab program performed. Prior to hospitalization patient was independent with mobility and lived with significant other in home.  Patient was in rehab for frequent falls, is on elliquis. PMH includes: A fib, AICD, arthritis, Barrett's esophagus, Basal cell carcinoma, blurred vision, CAD, Cardiomyopathy, carotid artery occlusion, cerebral atherosclerosis,  CHF, claudication, CAD, DVT, GERD, HLD, HTN, Meningioma, PE, TIA, seizures, ICD in place, and COPD.  Has absent seizures: blank stare with longest for hour and half. Patient is at home now and is supposed to use rollator. Fiance reports R leg stiffnes and "locks up on him" causing him to fall.     Communication/ command following Patient perseverates on concluding words/tasks and has sporadic bursts of understanding and confusion that made evaluating challenging.   -occasionally able to follow simple and moderate complexity cueing, other times is only able to follow demonstration.   PAIN: Reports no pain  POSTURE: Seated: Slight forward head rounded shoulders Standing: Weight shift onto LLE, R knee flexion, hip flexion with posterior weight shift.    STRENGTH:  Graded on a 0-5 scale Muscle testing limited by patient's ability to follow commands. Patient demonstrates ability to partially move LEs in : hip flexion, abduction, knee flexion, knee extension (limited by 10-15 degrees bilaterally), and ankle df/pf.   SENSATION/Coordination Unable to assess sensation due to patient cognitive status. Coordination: hypometria and dysmetria. Limited task orientation with perseveration upon last command.   SPECIAL TESTS: Cranial Nerves: vision: limited by patient cognitive status: generalized R peripheral vision loss seems to be present however could also be from difficulty with task orientation.  Able to follow H pattern with eyes.   FUNCTIONAL MOBILITY: Stand Pivot Transfer:  Use of rollator, requires PT to lock wheels, Mod A for transition with excessive posterior lean and shuffling of steps. Max verbal and visual cueing.  Festinating gait pattern.   Sit to Stand from transfer chair:  Use of rollator. Mod A with excessive posterior lean with weight shift over LLE and RLE having bent knee and pf position (on tip toe). Upon standing requires tactile cueing to gluteals and chest for upright  posture.   Sit to stand from plinth table: Use of rollator. Mod A with excessive posterior lean with weight shift over LLE.[name] Patient challenged with max verbal and tactile cueing for positioning with excessive hip flexion and knee flexion of RLE resulting in pf position.  BALANCE:  Static Sitting Balance  Normal Able to maintain balance against maximal resistance   Good Able to maintain balance against moderate resistance   Good-/Fair+ Accepts minimal resistance   Fair Able to sit unsupported without balance loss and without UE support   Poor+ Able to  maintain with Minimal assistance from individual or chair x  Poor Unable to maintain balance-requires mod/max support from individual or chair     Static Standing Balance  Normal Able to maintain standing balance against maximal resistance   Good Able to maintain standing balance against moderate resistance   Good-/Fair+ Able to maintain standing balance against minimal resistance   Fair Able to stand unsupported without UE support and without LOB for 1-2 min   Fair- Requires Min A and UE support to maintain standing without loss of balance   Poor+ Requires mod A and UE support to maintain standing without loss of balance x  Poor Requires max A and UE support to maintain standing balance without loss    Dynamic Sitting Balance  Normal Able to sit unsupported and weight shift across midline maximally   Good Able to sit unsupported and weight shift across midline moderately   Good-/Fair+ Able to sit unsupported and weight shift across midline minimally   Fair Minimal weight shifting ipsilateral/front, difficulty crossing midline   Fair- Reach to ipsilateral side and unable to weight shift   Poor + Able to sit unsupported with min A and reach to ipsilateral side, unable to weight shift x  Poor Able to sit unsupported with mod A and reach ipsilateral/front-can't cross midline     Standing Dynamic Balance  Normal Stand independently  unsupported, able to weight shift and cross midline maximally   Good Stand independently unsupported, able to weight shift and cross midline moderately   Good-/Fair+ Stand independently unsupported, able to weight shift across midline minimally   Fair Stand independently unsupported, weight shift, and reach ipsilaterally, loss of balance when crossing midline   Poor+ Able to stand with Min A and reach ipsilaterally, unable to weight shift   Poor Able to stand with Mod A and minimally reach ipsilaterally, unable to cross midline. x    GAIT: Ambulates with rolling walker at home (rollator) per daughter and fiance. Ambulated with rollator during evaluation with noted festinating gait pattern; excessive posterior trunk lean, shortened step length, and limited weight acceptance onto RLE.   OUTCOME MEASURES: TEST Outcome Interpretation  ABC 4.4% Low stability; filled out by fiance                        This patient presents with  3, personal factors/ comorbidities and 4  body elements including body structures and functions, activity limitations and or participation restrictions. [name] condition is unstable  Treat: Access Code: [email or web address]  URL: [email or web address]  Date: 07/02/2018  Prepared by: Janna Arch   Exercises  Seated March - 10 reps - 1 sets - 5 hold - 1x daily - 7x weekly  Seated Long Arc Quad - 10 reps - 1 sets - 5 hold - 1x daily - 7x weekly  Seated Heel Toe Raises - 10 reps - 1 sets - 5 hold - 1x daily - 7x weekly  Sit to stand with walker - 5 reps - 1 sets - 5 hold - 1x daily - 7x weekly   [facility] PT Assessment - 07/02/18 0001      Assessment   Medical Diagnosis  TBI    Referring Provider (PT)  Lavon Paganini Anguilini    Onset Date/Surgical Date  03/08/18    Hand Dominance  Right    Prior Therapy  in rehab after hospitalization      Precautions   Precautions  Fall      Restrictions  Weight Bearing Restrictions  No      Balance Screen   Has the  patient fallen in the past 6 months  Yes    How many times?  7    Has the patient had a decrease in activity level because of a fear of falling?   Yes    Is the patient reluctant to leave their home because of a fear of falling?   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              PT Education - 07/02/18 1618    Education Details  goals, POC, HEP     Person(s) Educated  Patient    Methods  Explanation;Demonstration;Tactile cues;Verbal cues    Comprehension  Verbalized understanding;Returned demonstration       PT Short Term Goals - 07/02/18 1633      PT SHORT TERM GOAL #1   Title  Patient will be independent in home exercise program to improve strength/mobility for  better functional independence with ADLs.    Baseline  HEP given     Time  4    Period  Weeks    Status  New    Target Date  07/30/18      PT SHORT TERM GOAL #2   Title  Patient will perform a sit to stand independently to increase independence with ADL performance and decrease fall risk    Baseline  12/18: mod A     Time  4    Period  Weeks    Status  New    Target Date  07/30/18      PT SHORT TERM GOAL #3   Title  Patient will demonstrate safe usage of rollator for household mobility to decrease fall risk.     Baseline  12/18: unaware of how to lock rollator, unable to demonstrate carryover    Time  4    Period  Weeks    Status  New    Target Date  07/30/18        PT Long Term Goals - 07/02/18 1634      PT LONG  TERM GOAL #1   Title  Patient (> 32 years old) will complete five times sit to stand test in < 30 seconds indicating an increased LE strength and improved balance.    Baseline  12/18; requires Mod A for 1 STS    Time  8    Period  Weeks    Status  New    Target Date  08/27/18      PT LONG TERM GOAL #2   Title  Patient will demonstrate ability to ambulate 100 ft independently without LOB to increase household mobility     Baseline  12/18: unable to perform >6 ft with rollator     Time  8    Period  Weeks    Status  New    Target Date  08/27/18      PT LONG TERM GOAL #3   Title  Patient will demonstrate ability to stand with minimal UE support for >10 minutes independently for performance of ADLs    Baseline  12/18: requires Mod A     Time  8    Period  Weeks    Status  New    Target Date  08/27/18      PT LONG TERM GOAL #4   Title  Patient will increase BLE gross strength to 4+/5 as to improve functional strength for independent gait, increased standing tolerance and increased ADL ability.    Baseline  12/18: 3/5 gross assessment    Time  8    Period  Weeks    Status  New    Target Date  08/27/18      PT LONG TERM GOAL #5   Title  Patient will deny  any falls over past 4 weeks to demonstrate improved safety awareness at home and work.     Baseline  12/18: falls frequently     Time  8    Period  Weeks    Status  New    Target Date  08/27/18             Plan - 07/02/18 1627    Clinical Impression Statement  Patient is a pleasant 77 year old male s/p TBI 03/08/18. Patient has just returned home from extensive stays in rehab and the hospital from multiple complications. [name] evaluation was limited by intermittent ability to follow commands, perseveration upon last command, and frequent loss of orientation to task. Patient responded better to demonstrative cueing with visualization assisting in performance. RLE is weaker than LLE and has noted decreased weight acceptance with transfers and ambulation. Unable to assess prolonged ambulation due to patient confusion and excessive posterior trunk lean resulting in Mod A to maintain standing balance. Patient required Mod A for transitions. Patient does have absent seizures. Patient will benefit from skilled physical therapy to increase patient's independence with transfers, mobility, and increase strength and balance to decrease falls risk.     History and Personal Factors relevant to plan of care:  This patient presents with  3, personal factors/ comorbidities and 4  body elements including body structures and functions, activity limitations and or participation restrictions. [name] condition is unstable    Clinical Presentation  Unstable    Clinical Presentation due to:  PMH includes: A fib, AICD, arthritis, Barrett's esophagus, Basal cell carcinoma, blurred vision, CAD, Cardiomyopathy, carotid artery occlusion, cerebral atherosclerosis, CHF, claudication, CAD, DVT, GERD, HLD, HTN, Meningioma, PE, TIA, seizures, ICD in place, and COPD.  Has absent seizures    Clinical Decision  Making  High    Rehab Potential  Fair    Clinical Impairments Affecting Rehab Potential  (+) good family support,  high previous level of funciton (-) age, frequent falls in short amount of time    PT Frequency  2x / week    PT Duration  8 weeks    PT Treatment/Interventions  ADLs/Self Care Home Management;Aquatic Therapy;Moist Heat;Electrical Stimulation;Cryotherapy;Ultrasound;DME Instruction;Gait training;Therapeutic exercise;Therapeutic activities;Functional mobility training;Stair training;Balance training;Neuromuscular re-education;Cognitive remediation;Patient/family education;Manual techniques;Wheelchair mobility training;Orthotic Fit/Training;Passive range of motion;Vestibular;Taping;Splinting;Energy conservation;Dry needling;Visual/perceptual remediation/compensation    PT Next Visit Plan  review HEP, standing in // bars    PT Home Exercise Plan  see sheet    Recommended Other Services  will see OT and ST per physician referral.     Consulted and Agree with Plan of Care  Patient;Family member/caregiver    Family Member Consulted  daughter, fiance       Patient will benefit from skilled therapeutic intervention in order to improve the following deficits and impairments:  Abnormal gait, Cardiopulmonary status limiting activity, Decreased activity tolerance, Decreased coordination, Decreased cognition, Decreased balance, Decreased endurance, Decreased knowledge of precautions, Decreased knowledge of use of DME, Decreased safety awareness, Decreased range of motion, Decreased mobility, Decreased strength, Difficulty walking, Impaired perceived functional ability, Impaired flexibility, Improper body mechanics, Postural dysfunction  Visit Diagnosis: Other abnormalities of gait and mobility  Unsteadiness on feet  Muscle weakness (generalized)     Problem List Patient Active Problem List   Diagnosis Date Noted  . Seizures ([place])   . Acute blood loss anemia   . Chronic congestive heart failure ([place])   . TBI (traumatic brain injury) ([place]) 05/15/2018  . ICD (implantable cardioverter-defibrillator) in  place 05/21/2016  . 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 Claudication of right lower extremity ([place]) 10/04/2015  . Preop cardiovascular exam 11/16/2014  . Palpitations 08/13/2013  . Chronic atrial fibrillation ([place]) 03/26/2013  . Hyperlipidemia 03/26/2013  . Barrett's esophagus 03/26/2013  . Obstructive sleep apnea 03/26/2013  . Cardiomyopathy, ischemic 12/19/2012  . HTN (hypertension) 12/19/2012  . Hx pulmonary embolism 12/19/2012  . NSVT (nonsustained ventricular tachycardia) ([place]) 10/23/2012  . CAD S/P multiple PCIs 10/23/2012  . PSVT (paroxysmal supraventricular tachycardia) ([place]) 09/30/2012  . Long term (current) use of anticoagulants 09/30/2012   Janna Arch, PT, DPT   07/02/2018, 4:43 PM  [place] MAIN [facility] SERVICES [address] [place], [place], [address] Phone: [phone]   Fax:  [phone]  Name: [name] MRN: [id number] Date of Birth: 1941/01/14

## 2018-07-04 ENCOUNTER — Emergency Department (HOSPITAL_COMMUNITY): Payer: Medicare Other

## 2018-07-04 ENCOUNTER — Ambulatory Visit: Payer: Medicare Other | Admitting: Speech Pathology

## 2018-07-04 ENCOUNTER — Other Ambulatory Visit: Payer: Self-pay

## 2018-07-04 ENCOUNTER — Ambulatory Visit: Payer: Medicare Other

## 2018-07-04 ENCOUNTER — Emergency Department (HOSPITAL_COMMUNITY)
Admission: EM | Admit: 2018-07-04 | Discharge: 2018-07-04 | Disposition: A | Discharge status: Home / Self Care | Payer: Medicare Other | Attending: Emergency Medicine | Admitting: Emergency Medicine

## 2018-07-04 DIAGNOSIS — I2510 Atherosclerotic heart disease of native coronary artery without angina pectoris: Secondary | ICD-10-CM | POA: Insufficient documentation

## 2018-07-04 DIAGNOSIS — Z87891 Personal history of nicotine dependence: Secondary | ICD-10-CM | POA: Insufficient documentation

## 2018-07-04 DIAGNOSIS — D72819 Decreased white blood cell count, unspecified: Secondary | ICD-10-CM

## 2018-07-04 DIAGNOSIS — Z79899 Other long term (current) drug therapy: Secondary | ICD-10-CM | POA: Insufficient documentation

## 2018-07-04 DIAGNOSIS — R569 Unspecified convulsions: Secondary | ICD-10-CM | POA: Diagnosis present

## 2018-07-04 DIAGNOSIS — Z96652 Presence of left artificial knee joint: Secondary | ICD-10-CM | POA: Insufficient documentation

## 2018-07-04 DIAGNOSIS — Z7901 Long term (current) use of anticoagulants: Secondary | ICD-10-CM | POA: Diagnosis not present

## 2018-07-04 DIAGNOSIS — J449 Chronic obstructive pulmonary disease, unspecified: Secondary | ICD-10-CM | POA: Diagnosis not present

## 2018-07-04 DIAGNOSIS — I110 Hypertensive heart disease with heart failure: Secondary | ICD-10-CM | POA: Diagnosis not present

## 2018-07-04 DIAGNOSIS — Z955 Presence of coronary angioplasty implant and graft: Secondary | ICD-10-CM | POA: Diagnosis not present

## 2018-07-04 DIAGNOSIS — Z7982 Long term (current) use of aspirin: Secondary | ICD-10-CM | POA: Insufficient documentation

## 2018-07-04 DIAGNOSIS — I509 Heart failure, unspecified: Secondary | ICD-10-CM | POA: Diagnosis not present

## 2018-07-04 DIAGNOSIS — I10 Essential (primary) hypertension: Secondary | ICD-10-CM

## 2018-07-04 LAB — URINALYSIS, COMPLETE (UACMP) WITH MICROSCOPIC
Bilirubin Urine: NEGATIVE
GLUCOSE, UA: NEGATIVE mg/dL
Hgb urine dipstick: NEGATIVE
Ketones, ur: 5 mg/dL — AB
Leukocytes, UA: NEGATIVE
Nitrite: NEGATIVE
Protein, ur: NEGATIVE mg/dL
Specific Gravity, Urine: 1.016 (ref 1.005–1.030)
pH: 6 (ref 5.0–8.0)

## 2018-07-04 LAB — I-STAT VENOUS BLOOD GAS, ED
Acid-Base Excess: 2 mmol/L (ref 0.0–2.0)
Bicarbonate: 27.7 mmol/L (ref 20.0–28.0)
O2 Saturation: 48 %
TCO2: 29 mmol/L (ref 22–32)
pCO2, Ven: 46 mmHg (ref 44.0–60.0)
pH, Ven: 7.387 (ref 7.250–7.430)
pO2, Ven: 27 mmHg — CL (ref 32.0–45.0)

## 2018-07-04 LAB — COMPREHENSIVE METABOLIC PANEL
ALT: 16 U/L (ref 0–44)
AST: 19 U/L (ref 15–41)
Albumin: 3.6 g/dL (ref 3.5–5.0)
Alkaline Phosphatase: 62 U/L (ref 38–126)
Anion gap: 8 (ref 5–15)
BUN: 11 mg/dL (ref 8–23)
CO2: 26 mmol/L (ref 22–32)
Calcium: 8.9 mg/dL (ref 8.9–10.3)
Chloride: 109 mmol/L (ref 98–111)
Creatinine, Ser: 0.76 mg/dL (ref 0.61–1.24)
GFR calc Af Amer: >60 mL/min (ref >60)
Glucose, Bld: 103 mg/dL — ABNORMAL HIGH (ref 70–99)
Potassium: 4.2 mmol/L (ref 3.5–5.1)
Sodium: 143 mmol/L (ref 135–145)
Total Bilirubin: 0.9 mg/dL (ref 0.3–1.2)
Total Protein: 6.2 g/dL — ABNORMAL LOW (ref 6.5–8.1)

## 2018-07-04 LAB — CBC WITH DIFFERENTIAL/PLATELET
Abs Immature Granulocytes: 0.01 10*3/uL (ref 0.00–0.07)
Basophils Absolute: 0 10*3/uL (ref 0.0–0.1)
Basophils Relative: 1 %
Eosinophils Absolute: 0.1 10*3/uL (ref 0.0–0.5)
Eosinophils Relative: 1 %
HCT: 35.2 % — ABNORMAL LOW (ref 39.0–52.0)
HEMOGLOBIN: 11.5 g/dL — AB (ref 13.0–17.0)
Immature Granulocytes: 0 %
Lymphocytes Relative: 28 %
Lymphs Abs: 1 10*3/uL (ref 0.7–4.0)
MCH: 33 pg (ref 26.0–34.0)
MCHC: 32.7 g/dL (ref 30.0–36.0)
MCV: 100.9 fL — ABNORMAL HIGH (ref 80.0–100.0)
MONO ABS: 0.3 10*3/uL (ref 0.1–1.0)
Monocytes Relative: 8 %
Neutro Abs: 2.2 10*3/uL (ref 1.7–7.7)
Neutrophils Relative %: 62 %
Platelets: 166 10*3/uL (ref 150–400)
RBC: 3.49 MIL/uL — AB (ref 4.22–5.81)
RDW: 14.6 % (ref 11.5–15.5)
WBC: 3.6 10*3/uL — ABNORMAL LOW (ref 4.0–10.5)
nRBC: 0 % (ref 0.0–0.2)

## 2018-07-04 LAB — CK: Total CK: 41 U/L — ABNORMAL LOW (ref 49–397)

## 2018-07-04 MED ORDER — VALPROATE SODIUM 500 MG/5ML IV SOLN
1000.0000 mg | Freq: Once | INTRAVENOUS | Status: AC
  Administered 2018-07-04: 1000 mg via INTRAVENOUS
  Filled 2018-07-04: qty 10

## 2018-07-04 MED ORDER — QUETIAPINE 12.5 MG HALF TABLET
12.5000 mg | ORAL_TABLET | Freq: Once | ORAL | Status: DC
  Filled 2018-07-04: qty 1

## 2018-07-04 MED ORDER — LORAZEPAM 2 MG/ML IJ SOLN
0.2500 mg | Freq: Once | INTRAMUSCULAR | Status: AC
  Administered 2018-07-04: 0.25 mg via INTRAVENOUS

## 2018-07-04 MED ORDER — LORAZEPAM 2 MG/ML IJ SOLN
INTRAMUSCULAR | Status: AC
  Filled 2018-07-04: qty 1

## 2018-07-04 MED ORDER — VALPROIC ACID 250 MG PO CAPS: 250.0000 mg | ORAL_CAPSULE | Freq: Two times a day (BID) | ORAL | 0 refills | Status: DC

## 2018-07-04 MED ORDER — SODIUM CHLORIDE 0.9 % IV BOLUS
250.0000 mL | Freq: Once | INTRAVENOUS | Status: AC
  Administered 2018-07-04: 250 mL via INTRAVENOUS

## 2018-07-04 MED ORDER — SODIUM CHLORIDE 0.9 % IV SOLN
200.0000 mg | Freq: Once | INTRAVENOUS | Status: AC
  Administered 2018-07-04: 200 mg via INTRAVENOUS
  Filled 2018-07-04: qty 20

## 2018-07-04 NOTE — ED Triage Notes (Signed)
Pt to ER by EMS - reported unresponsive episode this morning, hx of same multiple times with seizure workups, on seizure meds. Family reports was unresponsive and rigid this morning, on EMS arrival and once placed in the truck became more alert. Incontinent episode did occur.

## 2018-07-04 NOTE — ED Notes (Signed)
Patient very agitated at this time. Ripping all cords off and attempting to climb out of the bed. Family reports when patient does this he needs to have a bowel movement. With gait belt and NT assistance, patient was taken to the restroom with no bowel movement. Brought back to room, patient refusing to get in the bed, attempting to pull IV out, PA Brimson at bedside. Verbal orders given for 0.25 mg ativan.

## 2018-07-04 NOTE — ED Provider Notes (Signed)
[facility] EMERGENCY DEPARTMENT Provider Note   CSN: [id number] Arrival date & time: 07/04/18  3664     History   Chief Complaint Chief Complaint  Patient presents with  . Altered Mental Status    HPI [name] is a 77 y.o. male.  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  Patient was found by his family around 8:30 AM as they were trying to awaken him.  They report that he was "unresponsive" and his entire body was rigid.  Patient had had incontinence of urine.  He had gotten up to use the bathroom around 5 AM with no change in his mental status at that time.  [name] son was with him at the time and states that he had a slight left down turning of his lip.  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  Patient returned from rehab 3 days ago and has 24-hour care at home.  He has had a slow recovery, and family states that most recently he is developed some right lower extremity weakness over the past several weeks of therapy.  Patient did have a fall 1 week ago while still at his rehab facility.  He had a negative initial CT scan.  [name] family currently states compliance with his Vimpat AED medication.  This is patient's fourth "blackout spell" since his TBI in August 2019.  Past Medical History:  Diagnosis Date  . A-fib ([place])   . AICD (automatic cardioverter/defibrillator) present 05/21/2016  . Arthritis    "back of my neck extending to back of head" (05/21/2016)  . Barrett's esophagus    Gets periodic endoscopies & biopsies. Coumadin has been held in the past for these precedures.   . Basal cell carcinoma    "scalp; chest; back"  . Blurred vision    . CAD (coronary artery disease)    S/P stenting of his LAD, circumflex & marginal branch as well as his RCA in 2008.  . Cardiomyopathy ([place])    Significant ischemic cardiomyopathy. ECHO 08/27/12: EF = 45-50% (Previous Echo revealed EF = 25%)  . Carotid artery occlusion    Carotid Doppler 08/27/12 SUMMARY - Bilateral ICAs: Demonstrated normal patency without evidence of a significant diamenter reduction, tortuosity or any other vascular abnormality. This was a Normal carotid duplex Doppler Evaluation.  . Cerebral atherosclerosis    Carotid Doppler 08/27/12 SUMMARY - Bilateral ICAs: Demonstrated normal patency without evidence of a significant diamenter reduction, tortuosity or any other vascular abnormality. This was a Normal carotid duplex Doppler Evaluation.  . CHF (congestive heart failure) ([place])   . Chronic anticoagulation    For PE & PAF  . Claudication ([place])    right ABI of 0.84  . Coronary artery disease    Most recent cath by Dr. Gwenlyn Found 07/02/09 was remarkable for patent stents w/an 80% in-stent stenosis within the obtuse marginal branch stent, as well as 60% ostial RCA stenosis which was stented with a Taxus Liberte drug-eluting stent. Nuc stress test 09/03/12 was low risk & showed an inferolateral scar. & an EF of 39%  . Daily headache    "from the arthritis in back of neck" (05/21/2016)  . DVT (deep venous thrombosis) (  [place]) 09/2010   "after knee replacement"  . GERD (gastroesophageal reflux disease)   . H/O mitral valve disorder   . History of hiatal hernia   . History of kidney stones   . History of stomach ulcers   . Hyperlipidemia   . Hypertension   . Meningioma ([place]) 08/15/12   BrainLAB-guided left frontoparietal craniotomy w/removal of a meningioma.   . Obstructive sleep apnea    Sleep Study in 2011 AHI 3.2. "occasionally wear my mask" (05/21/2016)  . Paroxysmal atrial fibrillation (HCC)   . PSVT (paroxysmal supraventricular tachycardia) ([place])    2D ECHO 08/27/12: EF = 45-50%  (Previous Echo revealed EF = 25%)  . Pulmonary embolism ([place]) 09/2010   "after knee replacement"  . Shortness of breath dyspnea   . Sick sinus syndrome ([place])   . TIA (transient ischemic attack)     Patient Active Problem List   Diagnosis Date Noted  . Seizures ([place])   . Acute blood loss anemia   . Chronic congestive heart failure ([place])   . TBI (traumatic brain injury) ([place]) 05/15/2018  . ICD (implantable cardioverter-defibrillator) in place 05/21/2016  . 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 Claudication of right lower extremity ([place]) 10/04/2015  . Preop cardiovascular exam 11/16/2014  . Palpitations 08/13/2013  . Chronic atrial fibrillation ([place]) 03/26/2013  . Hyperlipidemia 03/26/2013  . Barrett's esophagus 03/26/2013  . Obstructive sleep apnea 03/26/2013  . Cardiomyopathy, ischemic 12/19/2012  . HTN (hypertension) 12/19/2012  . Hx pulmonary embolism 12/19/2012  . NSVT (nonsustained ventricular tachycardia) ([place]) 10/23/2012  . CAD S/P multiple PCIs 10/23/2012  . PSVT (paroxysmal supraventricular tachycardia) ([place]) 09/30/2012  . Long term (current) use of anticoagulants 09/30/2012    Past Surgical History:  Procedure Laterality Date  . BASAL CELL CARCINOMA EXCISION     "head X 2; back, chest" (05/21/2016)  . CARDIAC CATHETERIZATION     Past caths in 2008, 2006, 2005 & 2003  . CATARACT EXTRACTION W/ INTRAOCULAR LENS  IMPLANT, BILATERAL Bilateral 2005  . CORONARY ANGIOPLASTY WITH STENT PLACEMENT  07/02/09   Most recent cath by Dr. Gwenlyn Found 07/02/09 was remarkable for patent stents w/an 80% in-stent stenosis within the obtuse marginal branch stent, as well as 60% ostial RCA stenosis which was stented with a Taxus Liberte drug-eluting stent.   . CORONARY ANGIOPLASTY WITH STENT PLACEMENT     "I have 12 stents in my heart" (05/21/2016)  . CRANIOTOMY  08/15/2012   At [facility]. BrainLAB-guided left frontoparietal craniotomy w/removal of a meningioma.   . EP IMPLANTABLE DEVICE N/A  05/21/2016   Procedure: ICD Implant;  Surgeon: Evans Lance, MD;  Location: [facility] CV LAB;  Service: Cardiovascular;  Laterality: N/A;  . INGUINAL HERNIA REPAIR Right 11/23/2014   Procedure: HERNIA REPAIR INGUINAL ADULT;  Surgeon: Rochel Brome, MD;  Location: [facility] ORS;  Service: General;  Laterality: Right;  . INGUINAL HERNIA REPAIR Bilateral 2010-2016  . JOINT REPLACEMENT    . THROAT SURGERY     "laser for Barret's esophagus"  . TONSILLECTOMY    . TOTAL KNEE ARTHROPLASTY Left 09/2010   Surgery was complicated by PAF with a pulmonary embolism documented by CT angiogram.        Home Medications    Prior to Admission medications   Medication Sig Start Date End Date Taking? Authorizing Provider  acetaminophen (TYLENOL) 500 MG tablet Take 500 mg by mouth every 6 (six) hours as needed (for pain.).    Yes [provider]  apixaban Arne [place])  5 MG TABS tablet Take 1 tablet (5 mg total) by mouth 2 (two) times daily. 06/11/18  Yes Love, Ivan Anchors, PA-C  aspirin EC 81 MG tablet Take 81 mg by mouth daily.   Yes [provider]  finasteride (PROSCAR) 5 MG tablet Take 1 tablet (5 mg total) by mouth at bedtime. 06/11/18  Yes Love, Ivan Anchors, PA-C  lacosamide (VIMPAT) 200 MG TABS tablet Take 1 tablet (200 mg total) by mouth 2 (two) times daily. 06/11/18  Yes Love, Ivan Anchors, PA-C  Melatonin 3 MG TABS Take 2 tablets (6 mg total) by mouth at bedtime. 06/11/18  Yes Love, Ivan Anchors, PA-C  metoprolol tartrate (LOPRESSOR) 25 MG tablet Take 50 mg by mouth 2 (two) times daily.    Yes [provider]  pantoprazole (PROTONIX) 40 MG tablet Take 1 tablet (40 mg total) by mouth daily. 06/11/18  Yes Love, Ivan Anchors, PA-C  polyethylene glycol (MIRALAX / GLYCOLAX) packet Take 17 g by mouth daily. 06/11/18  Yes Love, Ivan Anchors, PA-C  polyvinyl alcohol (LIQUIFILM TEARS) 1.4 % ophthalmic solution Place 1 drop into both eyes as needed for dry eyes.    Yes [provider]  QUEtiapine  (SEROQUEL) 25 MG tablet Take 0.5 tablets (12.5 mg total) by mouth 2 (two) times daily. At 4 pm and 10 pm as well as prn. 06/11/18  Yes Love, Ivan Anchors, PA-C  senna (SENOKOT) 8.6 MG TABS tablet Take 2 tablets (17.2 mg total) by mouth at bedtime. 06/11/18  Yes Love, Ivan Anchors, PA-C  tamsulosin (FLOMAX) 0.4 MG CAPS capsule Take 1 capsule (0.4 mg total) by mouth daily after supper. 06/11/18  Yes Love, Ivan Anchors, PA-C  traZODone (DESYREL) 50 MG tablet Take 1 tablet (50 mg total) by mouth at bedtime. 06/11/18  Yes Love, Ivan Anchors, PA-C  vitamin B-12 (CYANOCOBALAMIN) 1000 MCG tablet Take 1,000 mcg by mouth daily.   Yes [provider]  docusate sodium (COLACE) 100 MG capsule Take 1 capsule (100 mg total) by mouth daily. Patient not taking: Reported on 06/26/2018 06/11/18   Love, Ivan Anchors, PA-C  Metoprolol Tartrate 37.5 MG TABS Take 37.5 mg by mouth 2 (two) times daily. Patient not taking: Reported on 06/26/2018 06/11/18   Love, Ivan Anchors, PA-C  QUEtiapine (SEROQUEL) 25 MG tablet Take 0.5 tablets (12.5 mg total) by mouth 2 (two) times daily as needed (agitation). Patient not taking: Reported on 07/04/2018 06/11/18   Love, Ivan Anchors, PA-C    Family History Family History  Problem Relation Age of Onset  . Cancer Mother   . Cardiomyopathy Sister   . Cancer Other        Breast    Social History Social History   Tobacco Use  . Smoking status: Former Smoker    Packs/day: 1.00    Years: 35.00    Pack years: 35.00    Types: Cigarettes    Last attempt to quit: 1980    Years since quitting: 39.9  . Smokeless tobacco: Former Systems developer    Quit date: 1989  Substance Use Topics  . Alcohol use: Yes    Alcohol/week: 28.0 standard drinks    Types: 28 Cans of beer per week    Comment: 4 beers per day  . Drug use: No     Allergies   Patient has no known allergies.   Review of Systems Review of Systems  Constitutional: Negative for chills and fever.  HENT: Negative for congestion and sore  throat.   Eyes:  Negative for visual disturbance.  Respiratory: Negative for cough, chest tightness and shortness of breath.   Cardiovascular: Negative for chest pain.  Gastrointestinal: Negative for abdominal pain, nausea and vomiting.  Genitourinary: Negative for dysuria and flank pain.  Musculoskeletal: Negative for back pain and myalgias.  Skin: Negative for rash.  Neurological: Positive for seizures and weakness. Negative for dizziness, syncope, light-headedness and headaches.     Physical Exam Updated Vital Signs BP (!) 157/93 (BP Location: Right Arm)   Pulse 78   Temp 97.6 F (36.4 C) (Rectal)   Resp 16   SpO2 93%   Physical Exam Vitals signs and nursing note reviewed.  Constitutional:      General: He is not in acute distress.    Appearance: He is well-developed.  HENT:     Head: Normocephalic and atraumatic.  Eyes:     Conjunctiva/sclera: Conjunctivae normal.     Pupils: Pupils are equal, round, and reactive to light.  Neck:     Musculoskeletal: Normal range of motion and neck supple.  Cardiovascular:     Rate and Rhythm: Normal rate and regular rhythm.     Heart sounds: S1 normal and S2 normal. No murmur.  Pulmonary:     Effort: Pulmonary effort is normal.     Breath sounds: Normal breath sounds. No wheezing or rales.  Abdominal:     General: There is no distension.     Palpations: Abdomen is soft.     Tenderness: There is no abdominal tenderness. There is no guarding.  Musculoskeletal: Normal range of motion.        General: No deformity.  Lymphadenopathy:     Cervical: No cervical adenopathy.  Skin:    General: Skin is warm and dry.     Findings: No erythema or rash.  Neurological:     Mental Status: He is alert.     Comments: Mental Status:  Patient exhibits a aphasia.  He is able to start a sentence, but unable to finish it. Able to follow 2 step commands without difficulty.  Cranial Nerves:  II:  Peripheral visual fields grossly normal, pupils  equal, round, reactive to light III,IV, VI: ptosis not present, extra-ocular motions intact bilaterally  V,VII: smile symmetric, facial light touch sensation equal VIII: hearing grossly normal to voice  X: uvula elevates symmetrically  XI: bilateral shoulder shrug symmetric and strong XII: midline tongue extension without fassiculations Motor:  Increased muscle tone throughout upper and lower extremities. 5/5 in upper extremities bilaterally including strong and equal grip strength and strength 4/5 in bilateral LEs including dorsiflexion/plantar flexion Sensory: Light touch normal in all extremities.  Gait: Not assessed.  Stance: No pronator drift and good coordination, strength, and position sense with tapping of bilateral arms (performed in sitting position). CV: distal pulses palpable throughout    Psychiatric:        Behavior: Behavior normal.        Thought Content: Thought content normal.        Judgment: Judgment normal.      ED Treatments / Results  Labs (all labs ordered are listed, but only abnormal results are displayed) Labs Reviewed  CBC WITH DIFFERENTIAL/PLATELET - Abnormal; Notable for the following components:      Result Value   WBC 3.6 (*)    RBC 3.49 (*)    Hemoglobin 11.5 (*)    HCT 35.2 (*)    MCV 100.9 (*)    All other components within normal limits  I-STAT VENOUS  BLOOD GAS, ED - Abnormal; Notable for the following components:   pO2, Ven 27.0 (*)    All other components within normal limits  COMPREHENSIVE METABOLIC PANEL  URINALYSIS, COMPLETE (UACMP) WITH MICROSCOPIC  CK  CBG MONITORING, ED    EKG EKG Interpretation  Date/Time:  Friday July 04 2018 10:13:41 EST Ventricular Rate:  81 PR Interval:    QRS Duration: 118 QT Interval:  397 QTC Calculation: 461 R Axis:   -38 Text Interpretation:  Atrial fibrillation Nonspecific IVCD with LAD Low voltage, precordial leads Borderline T abnormalities, inferior leads No significant change since  last tracing Confirmed by Wandra Arthurs [phone]) on 07/04/2018 1:11:35 PM   Radiology Dg Chest 2 View  Result Date: 07/04/2018 CLINICAL DATA:  Seizure EXAM: CHEST - 2 VIEW COMPARISON:  06/11/2017 FINDINGS: The heart remains markedly enlarged. Left subclavian pacemaker device and leads are stable. Pleural calcifications are stable. Normal vascularity. No pneumothorax or pleural effusion. IMPRESSION: Cardiomegaly without decompensation. Stable pleural calcifications. Electronically Signed   By: Marybelle Killings M.D.   On: 07/04/2018 12:52   Ct Head Wo Contrast  Result Date: 07/04/2018 CLINICAL DATA:  77 year old male found unresponsive this morning. Left skull fracture and cerebral hemorrhagic contusions in August this year. Remote prior vertex craniotomy related to meningioma. EXAM: CT HEAD WITHOUT CONTRAST TECHNIQUE: Contiguous axial images were obtained from the base of the skull through the vertex without intravenous contrast. COMPARISON:  06/26/2018 and earlier. FINDINGS: Brain: Mostly anterior bifrontal and bitemporal encephalomalacia, moderate on the left. There is some stable encephalomalacia also along the left middle frontal gyrus. Ex vacuo enlargement of the ventricular system, especially the left temporal horn. No midline shift, mass effect, or evidence of intracranial mass lesion. No acute intracranial hemorrhage identified. Stable gray-white matter differentiation throughout the brain. No cortically based acute infarct identified. Vascular: Calcified atherosclerosis at the skull base. No suspicious intracranial vascular hyperdensity. Skull: Prior posterior vertex craniotomy and right superior frontal burr hole. Healed nondisplaced left lateral skull fracture since August. No acute osseous abnormality identified. Sinuses/Orbits: Visualized paranasal sinuses and mastoids are stable and well pneumatized. Other: No acute orbit or scalp soft tissue findings. Occasional chronic scalp and face dystrophic  soft tissue calcifications. IMPRESSION: 1. No acute intracranial abnormality. 2. Stable non contrast CT appearance of the brain with multifocal posttraumatic appearing chronic encephalomalacia with ex vacuo ventricular enlargement. Electronically Signed   By: Genevie Ann M.D.   On: 07/04/2018 12:56    Procedures Procedures (including critical care time)  Medications Ordered in ED Medications  sodium chloride 0.9 % bolus 250 mL (250 mLs Intravenous New Bag/Given 07/04/18 1104)  lacosamide (VIMPAT) 200 mg in sodium chloride 0.9 % 25 mL IVPB (200 mg Intravenous New Bag/Given 07/04/18 1117)     Initial Impression / Assessment and Plan / ED Course  I have reviewed the triage vital signs and the nursing notes.  Pertinent labs & imaging results that were available during my care of the patient were reviewed by me and considered in my medical decision making (see chart for details).  Clinical Course as of Jul 04 1812  Fri Jul 04, 2018  1412 Reassessed.  Resting comfortably.   [AM]  1515 Patient becoming agitated and family reporting this is the time of day that he begins to sundown.  Patient is awaiting his Depakote loading dose. Patient given 0.25 mg Ativan as he is not taking PO at this time.    [AM]    Clinical Course User Index [AM]  Langston Masker B, PA-C    Patient is nontoxic-appearing, afebrile, and hemodynamically stable.  He has been slight hypertensive here in the emergency department.  After diagnosis for patient's presentation includes seizure, CVA, intracranial hemorrhage, subdural hematoma, metabolic derangement.  Does have increased muscular tone, no recent inciting events for possible muscle breakdown, will assess for rhabdomyolysis.  Case discussed with Dr. Lorraine Lax of neurology.  Per Dr. Lorraine Lax, he feels that patient does need a second antiepileptic medication.  Would recommend valproic acid given patient's previous intolerance of Keppra.  Would like pharmacy to check interactions  prior to initiating a loading dose with 1 g of Keppra.  States that if there are no interactions, would continue valproic acid 500 mg twice daily.  If there are interactions, would load patient with Keppra and then defer anti-epileptic therapy to patient's outpatient neurologist.  Work-up is very reassuring.  CT of the head without contrast with stable ventriculomegaly and encephalomalacia, but no acute findings.  There does not appear to be any secondary cause of patient's seizure-like activity today.  Patient did have isolated leukopenia.  Recommended follow-up with his primary care physician for this for repeat testing to make sure that none of his medications are causing agranulocytosis.  Will proceed with prescribing valproic acid.  Given that this medication can cause sedation, will start at low dose of 250 mg per discussion with attending physician.  Patient has upcoming appointment within the next 3 weeks with his neurologist through [facility].  Spoke with ED pharmacist who stated that there were no interactions with valproic acid and patient can proceed with being loaded with this medication.  Final Clinical Impressions(s) / ED Diagnoses   Final diagnoses:  Seizure-like activity ([place])  Elevated blood pressure reading with diagnosis of hypertension  Leukopenia, unspecified type    ED Discharge Orders         Ordered    valproic acid (DEPAKENE) 250 MG capsule  2 times daily     07/04/18 1530           Albesa Seen, [place] 07/04/18 1813    Drenda Freeze, MD 07/08/18 [phone]

## 2018-07-04 NOTE — Discharge Instructions (Addendum)
Please see the information and instructions below regarding your visit.  Your diagnoses today include:  1. Seizure-like activity (HCC)   2. Elevated blood pressure reading with diagnosis of hypertension   3. Leukopenia, unspecified type     Tests performed today include: See side panel of your discharge paperwork for testing performed today. Vital signs are listed at the bottom of these instructions.   Lab work is overall reassuring today.  His white blood cell count is slightly low today.  This will just need to be recessed to make sure it is not a medication adverse effect.  He will need repeat lab work at the time of his appointment with his neurologist.  Medications prescribed:    Take any prescribed medications only as prescribed, and any over the counter medications only as directed on the packaging.  He will start Depakote 250 mg twice a day. He will continue it until he sees his neurologist on January 14.   Home care instructions:  Please follow any educational materials contained in this packet.   Follow-up instructions: Please follow-up with your primary care provider in  for further evaluation of your symptoms if they are not completely improved.   Please follow up with neurology at [facility].   Return instructions:  Please return to the Emergency Department if you experience worsening symptoms.  Please return to the emergency department if you develop any further seizures, worsening weakness, any new neurologic symptoms such as facial weakness, extremity weakness, regression in speech. Please return if you have any other emergent concerns.  Additional Information:   Your vital signs today were: BP (!) 138/91    Pulse 79    Temp 97.6 F (36.4 C) (Rectal)    Resp (!) 26    SpO2 97%  If your blood pressure (BP) was elevated on multiple readings during this visit above 130 for the top number or above 80 for the bottom number, please have this repeated by your primary care  provider within one month. --------------  Thank you for allowing [place] to participate in your care today.

## 2018-07-04 NOTE — ED Notes (Signed)
Pt transported to xray/CT

## 2018-07-07 ENCOUNTER — Ambulatory Visit: Payer: Medicare Other | Admitting: Speech Pathology

## 2018-07-07 DIAGNOSIS — R2689 Other abnormalities of gait and mobility: Secondary | ICD-10-CM | POA: Diagnosis not present

## 2018-07-07 DIAGNOSIS — R41841 Cognitive communication deficit: Secondary | ICD-10-CM

## 2018-07-07 DIAGNOSIS — R4701 Aphasia: Secondary | ICD-10-CM

## 2018-07-08 ENCOUNTER — Other Ambulatory Visit: Payer: Self-pay

## 2018-07-08 ENCOUNTER — Encounter: Payer: Self-pay | Admitting: Speech Pathology

## 2018-07-08 NOTE — Therapy (Signed)
[name] [address] [place], [place], [address] Phone: [phone]   Fax:  [phone]  Speech Language Pathology Evaluation  Patient Details  Name: [name] MRN: [id number] Date of Birth: [birth year] Referring Provider (SLP): Cathlyn Parsons    Encounter Date: 07/07/2018  End of Session - 07/08/18 1037    Visit Number  1    Number of Visits  25    Date for SLP Re-Evaluation  10/07/18    SLP Start Time  57    SLP Stop Time   1500    SLP Time Calculation (min)  60 min    Activity Tolerance  Patient tolerated treatment well       Past Medical History:  Diagnosis Date  . A-fib ([place])   . AICD (automatic cardioverter/defibrillator) present 05/21/2016  . Arthritis    "back of my neck extending to back of head" (05/21/2016)  . Barrett's esophagus    Gets periodic endoscopies & biopsies. Coumadin has been held in the past for these precedures.   . Basal cell carcinoma    "scalp; chest; back"  . Blurred vision   . CAD (coronary artery disease)    S/P stenting of his LAD, circumflex & marginal branch as well as his RCA in 2008.  . Cardiomyopathy ([place])    Significant ischemic cardiomyopathy. ECHO 08/27/12: EF = 45-50% (Previous Echo revealed EF = 25%)  . Carotid artery occlusion    Carotid Doppler 08/27/12 SUMMARY - Bilateral ICAs: Demonstrated normal patency without evidence of a significant diamenter reduction, tortuosity or any other vascular abnormality. This was a Normal carotid duplex Doppler Evaluation.  . Cerebral atherosclerosis    Carotid Doppler 08/27/12 SUMMARY - Bilateral ICAs: Demonstrated normal patency without evidence of a significant diamenter reduction, tortuosity or any other vascular abnormality. This was a Normal carotid duplex Doppler Evaluation.  . CHF (congestive heart failure) ([place])   . Chronic anticoagulation    For PE & PAF  . Claudication ([place])    right ABI of 0.84  . Coronary  artery disease    Most recent cath by Dr. Gwenlyn Found 07/02/09 was remarkable for patent stents w/an 80% in-stent stenosis within the obtuse marginal branch stent, as well as 60% ostial RCA stenosis which was stented with a Taxus Liberte drug-eluting stent. Nuc stress test 09/03/12 was low risk & showed an inferolateral scar. & an EF of 39%  . Daily headache    "from the arthritis in back of neck" (05/21/2016)  . DVT (deep venous thrombosis) ([place]) 09/2010   "after knee replacement"  . GERD (gastroesophageal reflux disease)   . H/O mitral valve disorder   . History of hiatal hernia   . History of kidney stones   . History of stomach ulcers   . Hyperlipidemia   . Hypertension   . Meningioma ([place]) 08/15/12   BrainLAB-guided left frontoparietal craniotomy w/removal of a meningioma.   . Obstructive sleep apnea    Sleep Study in 2011 AHI 3.2. "occasionally wear my mask" (05/21/2016)  . Paroxysmal atrial fibrillation ([facility])   . PSVT (paroxysmal supraventricular tachycardia) ([place])    2D ECHO 08/27/12: EF = 45-50% (Previous Echo revealed EF = 25%)  . Pulmonary embolism ([place]) 09/2010   "after knee replacement"  . Shortness of breath dyspnea   . Sick sinus syndrome ([place])   . TIA (transient ischemic attack)     Past Surgical History:  Procedure Laterality Date  . BASAL CELL CARCINOMA  EXCISION     "head X 2; back, chest" (05/21/2016)  . CARDIAC CATHETERIZATION     Past caths in 2008, 2006, 2005 & 2003  . CATARACT EXTRACTION W/ INTRAOCULAR LENS  IMPLANT, BILATERAL Bilateral 2005  . CORONARY ANGIOPLASTY WITH STENT PLACEMENT  07/02/09   Most recent cath by Dr. Gwenlyn Found 07/02/09 was remarkable for patent stents w/an 80% in-stent stenosis within the obtuse marginal branch stent, as well as 60% ostial RCA stenosis which was stented with a Taxus Liberte drug-eluting stent.   . CORONARY ANGIOPLASTY WITH STENT PLACEMENT     "I have 12 stents in my heart" (05/21/2016)  . CRANIOTOMY  08/15/2012   At [facility].  BrainLAB-guided left frontoparietal craniotomy w/removal of a meningioma.   . EP IMPLANTABLE DEVICE N/A 05/21/2016   Procedure: ICD Implant;  Surgeon: Evans Lance, MD;  Location: [place] [facility];  Service: Cardiovascular;  Laterality: N/A;  . INGUINAL HERNIA REPAIR Right 11/23/2014   Procedure: HERNIA REPAIR INGUINAL ADULT;  Surgeon: Rochel Brome, MD;  Location: [facility] ORS;  Service: General;  Laterality: Right;  . INGUINAL HERNIA REPAIR Bilateral 2010-2016  . JOINT REPLACEMENT    . THROAT SURGERY     "laser for Barret's esophagus"  . TONSILLECTOMY    . TOTAL KNEE ARTHROPLASTY Left 09/2010   Surgery was complicated by PAF with a pulmonary embolism documented by CT angiogram.    There were no vitals filed for this visit.      SLP Evaluation [facility] - 07/08/18 0001      SLP Visit Information   SLP Received On  07/07/18    Referring Provider (SLP)  Lauraine Rinne J     Onset Date  03/08/2018    Medical Diagnosis  Intracranial hemorrhage      Subjective   Subjective  Patient cooperative with SLP    Patient/Family Stated Goal  Improved/functional communication      Pain Assessment   Pain Score  0-No pain      General Information   HPI  [name] is a 77 year old male with history of A. fib, CAD/chronic systolic CHF EF 39% status post ICD, Barrett's esophagus, history of PE, OSA; who was originally hospitalized at [facility] August/2019 after TBI post fall with multifocal ICH, SAH treated with a EVD.  Hospital course prolonged requiring PEG for nutritional support as well as a Foley due to issues with urinary retention.  He was discharged to [name] but readmitted to [facility] 04/23/2018 after found unresponsive for prolonged period of time.  CT of head negative for acute changes however EEG showed evidence of epileptiform discharges in left parietal region.  He was started on Vimpat per neurology input.  CIR was recommended for follow-up therapy. DAMARRI RAMPY was admitted to rehab  05/15/2018 for inpatient therapies to consist of PT, ST and OT at least three hours five days a week. Past admission physiatrist, therapy team and rehab RN have worked together to provide customized collaborative inpatient rehab.  He continues to have poor safety awareness with perseverative behaviors, distractibility, and sleep-wake disruption.  As per intake was noted to be covered PEG was DC'd without difficulty. His mentation has improved with resolution of lethargy.   Speech therapy reported that the patient made functional, but inconsistent, cognitive communication gains.  He was discharged to [facility] and rehab on 06/11/2018.  He is now home and coming to [facility] for outpatient PT/ST/OT.      Prior Functional Status   Cognitive/Linguistic Baseline  Within functional limits      Cognition   Overall Cognitive Status  Impaired/Different from baseline    Area of Impairment  Orientation;Attention;Memory;Following commands;Safety/judgement;Awareness;Problem solving    Behaviors  Restless;Perseveration      Auditory Comprehension   Overall Auditory Comprehension  Impaired    Yes/No Questions  Impaired    Basic Biographical Questions  26-50% accurate    Basic Immediate Environment Questions  25-49% accurate    Other Yes/No Questions Comments`  Other (comment)   Perservation and ambiguous yes/no   Commands  Impaired    One Step Basic Commands  25-49% accurate    Conversation  Other (comment)    Other Conversation Comments  Functional comprehension of paralinguistic aspects    Overall Auditory Comprehension Comments  Severe receptive aphasia      Reading Comprehension   Reading Status  Impaired      Expression   Primary Mode of Expression  Verbal      Verbal Expression   Overall Verbal Expression  Impaired    Initiation  Impaired    Automatic Speech  Name;Social Response    Level of Generative/Spontaneous Verbalization  Word;Phrase    Repetition  Impaired    Level of  Impairment  Phrase level    Responsive  0-25% accurate    Pragmatics  No impairment      Oral Motor/Sensory Function   Overall Oral Motor/Sensory Function  Appears within functional limits for tasks assessed      Motor Speech   Overall Motor Speech  Impaired    Respiration  Within functional limits    Phonation  Normal    Resonance  Within functional limits    Articulation  Within functional limitis    Intelligibility  Intelligibility reduced    Motor Planning  Impaired    Level of Impairment  Word    Motor Speech Errors  Groping for words;Inconsistent      Standardized Assessments   Standardized Assessments   Western Aphasia Battery revised        Western Aphasia Battery- Revised  Spontaneous Speech      Information content  4/10       Fluency   2/10     Comprehension     Yes/No questions  24/60        Auditory Word Recognition 1/60        Sequential Commands 4/80    Repetition   24/100     Naming    Object Naming  3/60        Word Fluency   0/20        Sentence Completion 2/10        Responsive Speech   2/10     Aphasia Quotient  20.7/100  SLP Education - 07/08/18 1036    Education Details  Suggested goals    Person(s) Educated  Patient;Spouse;Child(ren)    Methods  Explanation    Comprehension  Verbalized understanding         SLP Long Term Goals - 07/08/18 1040      SLP LONG TERM GOAL #1   Title  Patient will name objects using multi-modal strategies with 80% accuracy.    Time  12    Period  Weeks    Status  New    Target Date  10/07/18      SLP LONG TERM GOAL #2   Title  Patient will complete 1 unit processing tasks (yes/no questions, commands) with 80% accuracy without the need  of repetition of task instructions or significant delays in responding.    Time  12    Period  Weeks    Status  New    Target Date  10/07/18      SLP LONG TERM GOAL #3   Title  Patient will identify stated object/picture, given multi-modal cues, with 80% accuracy.    Time   12    Period  Weeks    Status  New    Target Date  10/07/18      SLP LONG TERM GOAL #4   Title  Patient will read aloud 3 word phrases with 80% accuracy.    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    The patient would benefit from skilled speech therapy for restorative and compensatory treatment of aphasia and cognitive communication deficits.     Speech Therapy Frequency  2x / week    Duration  Other (comment)   12 weeks   Treatment/Interventions  Language facilitation;Cognitive reorganization;Multimodal communcation approach;Patient/family education;SLP instruction and feedback    Potential to Achieve Goals  Good    Potential Considerations  Ability to learn/carryover information;Pain level;Family/community support;Co-morbidities;Previous level of function;Cooperation/participation level;Severity of impairments;Medical prognosis    SLP Home Exercise Plan  TBD    Consulted and Agree with Plan of Care  Patient;Family member/caregiver    Family Member Consulted  Significant other, daughter       Patient will benefit from skilled therapeutic intervention in order to improve the following deficits and impairments:   Aphasia - Plan: SLP plan of care cert/re-cert  Cognitive communication deficit - Plan: SLP plan of care cert/re-cert    Problem List Patient  Active Problem List   Diagnosis Date Noted  . Seizures ([place])   . Acute blood loss anemia   . Chronic congestive heart failure ([place])   . TBI (traumatic brain injury) ([place]) 05/15/2018  . ICD (implantable cardioverter-defibrillator) in place 05/21/2016  . 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 Claudication of right lower extremity ([place]) 10/04/2015  . Preop cardiovascular exam 11/16/2014  . Palpitations 08/13/2013  . Chronic atrial fibrillation ([place]) 03/26/2013  . Hyperlipidemia 03/26/2013  . Barrett's esophagus 03/26/2013  . Obstructive sleep apnea 03/26/2013  . Cardiomyopathy, ischemic 12/19/2012  . HTN (hypertension) 12/19/2012  . Hx pulmonary embolism 12/19/2012  . NSVT (nonsustained ventricular tachycardia) ([place]) 10/23/2012  . CAD S/P multiple PCIs 10/23/2012  . PSVT (paroxysmal supraventricular tachycardia) ([place]) 09/30/2012  . Long term (current) use of anticoagulants 09/30/2012   Leroy Sea, MS/CCC- SLP  Lou Miner 07/08/2018, 10:57 AM  [place] [facility] [address] [place], [place], [address] Phone: [phone]   Fax:  [phone]  Name: SAMIE REASONS MRN: [id number] Date of Birth: 06-Dec-1940

## 2018-07-13 ENCOUNTER — Encounter: Payer: Self-pay | Admitting: Emergency Medicine

## 2018-07-13 ENCOUNTER — Emergency Department
Admission: EM | Admit: 2018-07-13 | Discharge: 2018-07-13 | Disposition: A | Discharge status: Home / Self Care | Payer: Medicare Other | Source: Home / Self Care | Attending: Emergency Medicine | Admitting: Emergency Medicine

## 2018-07-13 ENCOUNTER — Emergency Department: Payer: Medicare Other

## 2018-07-13 ENCOUNTER — Other Ambulatory Visit: Payer: Self-pay

## 2018-07-13 DIAGNOSIS — I2510 Atherosclerotic heart disease of native coronary artery without angina pectoris: Secondary | ICD-10-CM | POA: Insufficient documentation

## 2018-07-13 DIAGNOSIS — R531 Weakness: Secondary | ICD-10-CM

## 2018-07-13 DIAGNOSIS — I5022 Chronic systolic (congestive) heart failure: Secondary | ICD-10-CM | POA: Insufficient documentation

## 2018-07-13 DIAGNOSIS — Z7982 Long term (current) use of aspirin: Secondary | ICD-10-CM | POA: Insufficient documentation

## 2018-07-13 DIAGNOSIS — R4701 Aphasia: Secondary | ICD-10-CM | POA: Diagnosis not present

## 2018-07-13 DIAGNOSIS — G4733 Obstructive sleep apnea (adult) (pediatric): Secondary | ICD-10-CM | POA: Diagnosis not present

## 2018-07-13 DIAGNOSIS — Z79899 Other long term (current) drug therapy: Secondary | ICD-10-CM | POA: Diagnosis not present

## 2018-07-13 DIAGNOSIS — Z7901 Long term (current) use of anticoagulants: Secondary | ICD-10-CM

## 2018-07-13 DIAGNOSIS — W19XXXA Unspecified fall, initial encounter: Secondary | ICD-10-CM

## 2018-07-13 DIAGNOSIS — Y999 Unspecified external cause status: Secondary | ICD-10-CM

## 2018-07-13 DIAGNOSIS — Z85828 Personal history of other malignant neoplasm of skin: Secondary | ICD-10-CM | POA: Insufficient documentation

## 2018-07-13 DIAGNOSIS — R569 Unspecified convulsions: Secondary | ICD-10-CM | POA: Diagnosis present

## 2018-07-13 DIAGNOSIS — S8991XA Unspecified injury of right lower leg, initial encounter: Secondary | ICD-10-CM

## 2018-07-13 DIAGNOSIS — G40409 Other generalized epilepsy and epileptic syndromes, not intractable, without status epilepticus: Secondary | ICD-10-CM | POA: Diagnosis not present

## 2018-07-13 DIAGNOSIS — N39498 Other specified urinary incontinence: Secondary | ICD-10-CM | POA: Diagnosis not present

## 2018-07-13 DIAGNOSIS — K219 Gastro-esophageal reflux disease without esophagitis: Secondary | ICD-10-CM | POA: Diagnosis not present

## 2018-07-13 DIAGNOSIS — I495 Sick sinus syndrome: Secondary | ICD-10-CM | POA: Diagnosis not present

## 2018-07-13 DIAGNOSIS — Z9841 Cataract extraction status, right eye: Secondary | ICD-10-CM | POA: Diagnosis not present

## 2018-07-13 DIAGNOSIS — F0390 Unspecified dementia without behavioral disturbance: Secondary | ICD-10-CM

## 2018-07-13 DIAGNOSIS — M4692 Unspecified inflammatory spondylopathy, cervical region: Secondary | ICD-10-CM | POA: Diagnosis not present

## 2018-07-13 DIAGNOSIS — K2270 Barrett's esophagus without dysplasia: Secondary | ICD-10-CM | POA: Diagnosis not present

## 2018-07-13 DIAGNOSIS — R443 Hallucinations, unspecified: Secondary | ICD-10-CM | POA: Diagnosis not present

## 2018-07-13 DIAGNOSIS — E785 Hyperlipidemia, unspecified: Secondary | ICD-10-CM | POA: Diagnosis not present

## 2018-07-13 DIAGNOSIS — N401 Enlarged prostate with lower urinary tract symptoms: Secondary | ICD-10-CM | POA: Diagnosis not present

## 2018-07-13 DIAGNOSIS — I110 Hypertensive heart disease with heart failure: Secondary | ICD-10-CM

## 2018-07-13 DIAGNOSIS — Z79890 Hormone replacement therapy (postmenopausal): Secondary | ICD-10-CM | POA: Diagnosis not present

## 2018-07-13 DIAGNOSIS — J439 Emphysema, unspecified: Secondary | ICD-10-CM | POA: Diagnosis not present

## 2018-07-13 DIAGNOSIS — Y939 Activity, unspecified: Secondary | ICD-10-CM

## 2018-07-13 DIAGNOSIS — I480 Paroxysmal atrial fibrillation: Secondary | ICD-10-CM | POA: Diagnosis not present

## 2018-07-13 DIAGNOSIS — Y92009 Unspecified place in unspecified non-institutional (private) residence as the place of occurrence of the external cause: Secondary | ICD-10-CM

## 2018-07-13 DIAGNOSIS — Z9842 Cataract extraction status, left eye: Secondary | ICD-10-CM | POA: Diagnosis not present

## 2018-07-13 DIAGNOSIS — Z87891 Personal history of nicotine dependence: Secondary | ICD-10-CM | POA: Insufficient documentation

## 2018-07-13 DIAGNOSIS — I255 Ischemic cardiomyopathy: Secondary | ICD-10-CM | POA: Diagnosis not present

## 2018-07-13 LAB — CBC WITH DIFFERENTIAL/PLATELET
Abs Immature Granulocytes: 0.02 10*3/uL (ref 0.00–0.07)
Basophils Absolute: 0 10*3/uL (ref 0.0–0.1)
Basophils Relative: 0 %
Eosinophils Absolute: 0.1 10*3/uL (ref 0.0–0.5)
Eosinophils Relative: 1 %
HCT: 36.4 % — ABNORMAL LOW (ref 39.0–52.0)
Hemoglobin: 11.7 g/dL — ABNORMAL LOW (ref 13.0–17.0)
Immature Granulocytes: 0 %
Lymphocytes Relative: 19 %
Lymphs Abs: 0.9 10*3/uL (ref 0.7–4.0)
MCH: 32 pg (ref 26.0–34.0)
MCHC: 32.1 g/dL (ref 30.0–36.0)
MCV: 99.5 fL (ref 80.0–100.0)
Monocytes Absolute: 0.4 10*3/uL (ref 0.1–1.0)
Monocytes Relative: 8 %
Neutro Abs: 3.3 10*3/uL (ref 1.7–7.7)
Neutrophils Relative %: 72 %
Platelets: 160 10*3/uL (ref 150–400)
RBC: 3.66 MIL/uL — ABNORMAL LOW (ref 4.22–5.81)
RDW: 14.8 % (ref 11.5–15.5)
WBC: 4.7 10*3/uL (ref 4.0–10.5)
nRBC: 0 % (ref 0.0–0.2)

## 2018-07-13 LAB — BASIC METABOLIC PANEL
Anion gap: 7 (ref 5–15)
BUN: 12 mg/dL (ref 8–23)
CO2: 26 mmol/L (ref 22–32)
Calcium: 8.5 mg/dL — ABNORMAL LOW (ref 8.9–10.3)
Chloride: 109 mmol/L (ref 98–111)
Creatinine, Ser: 0.59 mg/dL — ABNORMAL LOW (ref 0.61–1.24)
GFR calc Af Amer: >60 mL/min (ref >60)
GFR calc non Af Amer: >60 mL/min (ref >60)
Glucose, Bld: 107 mg/dL — ABNORMAL HIGH (ref 70–99)
Potassium: 3.4 mmol/L — ABNORMAL LOW (ref 3.5–5.1)
Sodium: 142 mmol/L (ref 135–145)

## 2018-07-13 LAB — VALPROIC ACID LEVEL: Valproic Acid Lvl: 37 ug/mL — ABNORMAL LOW (ref 50.0–100.0)

## 2018-07-13 NOTE — ED Notes (Signed)
Patient assisted to stand and use urinal. Patient able to stand in place with assistance, however when asked to take a few steps, patient became very shaky and unable to move his right leg forward. Patient able to sit back in bed and slowly scoot himself around the edge of the bed. Repositioned in bed.

## 2018-07-13 NOTE — ED Provider Notes (Addendum)
[facility] Emergency Department Provider Note  ____________________________________________   I have reviewed the triage vital signs and the nursing notes. Where available I have reviewed prior notes and, if possible and indicated, outside hospital notes.    HISTORY  Chief Complaint Fall and Leg Injury    HPI [name] is a 77 y.o. male  With a complicated past medical history including dementia seizure disorder according to family who is on Vimpat and recently started on Depakote, AICD, A. fib, on Eliquis, and a host of other different medical problems which I have reviewed, see below, presents today family concerns that his leg hurts.  Patient has a wheelchair at home but when he walks he states that his right leg hurts.  He has had multiple frequent falls.  He has not fallen recently.  He is not complaining of any pain at this time he is otherwise at his baseline.  Level 5 chart caveat; no further history available due to patient status.     Past Medical History:  Diagnosis Date  . A-fib ([place])   . AICD (automatic cardioverter/defibrillator) present 05/21/2016  . Arthritis    "back of my neck extending to back of head" (05/21/2016)  . Barrett's esophagus    Gets periodic endoscopies & biopsies. Coumadin has been held in the past for these precedures.   . Basal cell carcinoma    "scalp; chest; back"  . Blurred vision   . CAD (coronary artery disease)    S/P stenting of his LAD, circumflex & marginal branch as well as his RCA in 2008.  . Cardiomyopathy ([place])    Significant ischemic cardiomyopathy. ECHO 08/27/12: EF = 45-50% (Previous Echo revealed EF = 25%)  . Carotid artery occlusion    Carotid Doppler 08/27/12 SUMMARY - Bilateral ICAs: Demonstrated normal patency without evidence of a significant diamenter reduction, tortuosity or any other vascular abnormality. This was a Normal carotid duplex Doppler Evaluation.  . Cerebral atherosclerosis     Carotid Doppler 08/27/12 SUMMARY - Bilateral ICAs: Demonstrated normal patency without evidence of a significant diamenter reduction, tortuosity or any other vascular abnormality. This was a Normal carotid duplex Doppler Evaluation.  . CHF (congestive heart failure) ([place])   . Chronic anticoagulation    For PE & PAF  . Claudication ([place])    right ABI of 0.84  . Coronary artery disease    Most recent cath by Dr. Gwenlyn Found 07/02/09 was remarkable for patent stents w/an 80% in-stent stenosis within the obtuse marginal branch stent, as well as 60% ostial RCA stenosis which was stented with a Taxus Liberte drug-eluting stent. Nuc stress test 09/03/12 was low risk & showed an inferolateral scar. & an EF of 39%  . Daily headache    "from the arthritis in back of neck" (05/21/2016)  . DVT (deep venous thrombosis) ([place]) 09/2010   "after knee replacement"  . GERD (gastroesophageal reflux disease)   . H/O mitral valve disorder   . History of hiatal hernia   . History of kidney stones   . History of stomach ulcers   . Hyperlipidemia   . Hypertension   . Meningioma ([place]) 08/15/12   BrainLAB-guided left frontoparietal craniotomy w/removal of a meningioma.   . Obstructive sleep apnea    Sleep Study in 2011 AHI 3.2. "occasionally wear my mask" (05/21/2016)  . Paroxysmal atrial fibrillation (HCC)   . PSVT (paroxysmal supraventricular tachycardia) ([place])    2D ECHO 08/27/12: EF = 45-50% (Previous Echo revealed EF =  25%)  . Pulmonary embolism ([place]) 09/2010   "after knee replacement"  . Shortness of breath dyspnea   . Sick sinus syndrome ([place])   . TIA (transient ischemic attack)     Patient Active Problem List   Diagnosis Date Noted  . Seizures ([place])   . Acute blood loss anemia   . Chronic congestive heart failure ([place])   . TBI (traumatic brain injury) ([place]) 05/15/2018  . ICD (implantable cardioverter-defibrillator) in place 05/21/2016  . 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 Claudication of right lower  extremity ([place]) 10/04/2015  . Preop cardiovascular exam 11/16/2014  . Palpitations 08/13/2013  . Chronic atrial fibrillation ([place]) 03/26/2013  . Hyperlipidemia 03/26/2013  . Barrett's esophagus 03/26/2013  . Obstructive sleep apnea 03/26/2013  . Cardiomyopathy, ischemic 12/19/2012  . HTN (hypertension) 12/19/2012  . Hx pulmonary embolism 12/19/2012  . NSVT (nonsustained ventricular tachycardia) ([place]) 10/23/2012  . CAD S/P multiple PCIs 10/23/2012  . PSVT (paroxysmal supraventricular tachycardia) ([place]) 09/30/2012  . Long term (current) use of anticoagulants 09/30/2012    Past Surgical History:  Procedure Laterality Date  . BASAL CELL CARCINOMA EXCISION     "head X 2; back, chest" (05/21/2016)  . CARDIAC CATHETERIZATION     Past caths in 2008, 2006, 2005 & 2003  . CATARACT EXTRACTION W/ INTRAOCULAR LENS  IMPLANT, BILATERAL Bilateral 2005  . CORONARY ANGIOPLASTY WITH STENT PLACEMENT  07/02/09   Most recent cath by Dr. Gwenlyn Found 07/02/09 was remarkable for patent stents w/an 80% in-stent stenosis within the obtuse marginal branch stent, as well as 60% ostial RCA stenosis which was stented with a Taxus Liberte drug-eluting stent.   . CORONARY ANGIOPLASTY WITH STENT PLACEMENT     "I have 12 stents in my heart" (05/21/2016)  . CRANIOTOMY  08/15/2012   At [facility]. BrainLAB-guided left frontoparietal craniotomy w/removal of a meningioma.   . EP IMPLANTABLE DEVICE N/A 05/21/2016   Procedure: ICD Implant;  Surgeon: Evans Lance, MD;  Location: [facility];  Service: Cardiovascular;  Laterality: N/A;  . INGUINAL HERNIA REPAIR Right 11/23/2014   Procedure: HERNIA REPAIR INGUINAL ADULT;  Surgeon: Rochel Brome, MD;  Location: [facility] ORS;  Service: General;  Laterality: Right;  . INGUINAL HERNIA REPAIR Bilateral 2010-2016  . JOINT REPLACEMENT    . THROAT SURGERY     "laser for Barret's esophagus"  . TONSILLECTOMY    . TOTAL KNEE ARTHROPLASTY Left 09/2010   Surgery was complicated by PAF with a  pulmonary embolism documented by CT angiogram.    Prior to Admission medications   Medication Sig Start Date End Date Taking? Authorizing Provider  acetaminophen (TYLENOL) 500 MG tablet Take 500 mg by mouth every 6 (six) hours as needed (for pain.).     [provider]  apixaban (ELIQUIS) 5 MG TABS tablet Take 1 tablet (5 mg total) by mouth 2 (two) times daily. 06/11/18   Love, Ivan Anchors, PA-C  aspirin EC 81 MG tablet Take 81 mg by mouth daily.    [provider]  docusate sodium (COLACE) 100 MG capsule Take 1 capsule (100 mg total) by mouth daily. Patient not taking: Reported on 06/26/2018 06/11/18   Love, Ivan Anchors, PA-C  finasteride (PROSCAR) 5 MG tablet Take 1 tablet (5 mg total) by mouth at bedtime. 06/11/18   Love, Ivan Anchors, PA-C  lacosamide (VIMPAT) 200 MG TABS tablet Take 1 tablet (200 mg total) by mouth 2 (two) times daily. 06/11/18   Bary Leriche, PA-C  Melatonin 3  MG TABS Take 2 tablets (6 mg total) by mouth at bedtime. 06/11/18   Love, Ivan Anchors, PA-C  metoprolol tartrate (LOPRESSOR) 25 MG tablet Take 50 mg by mouth 2 (two) times daily.     [provider]  Metoprolol Tartrate 37.5 MG TABS Take 37.5 mg by mouth 2 (two) times daily. Patient not taking: Reported on 06/26/2018 06/11/18   Love, Ivan Anchors, PA-C  pantoprazole (PROTONIX) 40 MG tablet Take 1 tablet (40 mg total) by mouth daily. 06/11/18   Love, Ivan Anchors, PA-C  polyethylene glycol (MIRALAX / GLYCOLAX) packet Take 17 g by mouth daily. 06/11/18   Love, Ivan Anchors, PA-C  polyvinyl alcohol (LIQUIFILM TEARS) 1.4 % ophthalmic solution Place 1 drop into both eyes as needed for dry eyes.     [provider]  QUEtiapine (SEROQUEL) 25 MG tablet Take 0.5 tablets (12.5 mg total) by mouth 2 (two) times daily as needed (agitation). Patient not taking: Reported on 07/04/2018 06/11/18   Love, Ivan Anchors, PA-C  QUEtiapine (SEROQUEL) 25 MG tablet Take 0.5 tablets (12.5 mg total) by mouth 2 (two) times daily. At 4  pm and 10 pm as well as prn. 06/11/18   Love, Ivan Anchors, PA-C  senna (SENOKOT) 8.6 MG TABS tablet Take 2 tablets (17.2 mg total) by mouth at bedtime. 06/11/18   Love, Ivan Anchors, PA-C  tamsulosin (FLOMAX) 0.4 MG CAPS capsule Take 1 capsule (0.4 mg total) by mouth daily after supper. 06/11/18   Love, Ivan Anchors, PA-C  traZODone (DESYREL) 50 MG tablet Take 1 tablet (50 mg total) by mouth at bedtime. 06/11/18   Love, Ivan Anchors, PA-C  valproic acid (DEPAKENE) 250 MG capsule Take 1 capsule (250 mg total) by mouth 2 (two) times daily for 26 days. 07/04/18 07/30/18  Langston Masker B, PA-C  vitamin B-12 (CYANOCOBALAMIN) 1000 MCG tablet Take 1,000 mcg by mouth daily.    [provider]    Allergies Patient has no known allergies.  Family History  Problem Relation Age of Onset  . Cancer Mother   . Cardiomyopathy Sister   . Cancer Other        Breast    Social History Social History   Tobacco Use  . Smoking status: Former Smoker    Packs/day: 1.00    Years: 35.00    Pack years: 35.00    Types: Cigarettes    Last attempt to quit: 1980    Years since quitting: 40.0  . Smokeless tobacco: Former Systems developer    Quit date: 1989  Substance Use Topics  . Alcohol use: Yes    Alcohol/week: 28.0 standard drinks    Types: 28 Cans of beer per week    Comment: 4 beers per day  . Drug use: No    Review of Systems, per patient and family, patient's contribution is quite limited Constitutional: No fever/chills Eyes: No visual changes. ENT: No sore throat. No stiff neck no neck pain Cardiovascular: Denies chest pain. Respiratory: Denies shortness of breath. Gastrointestinal:   no vomiting.  No diarrhea.  No constipation. Genitourinary: Negative for dysuria. Musculoskeletal: Negative lower extremity swelling Skin: Negative for rash. Neurological: Negative for severe headaches, focal weakness or numbness.   ____________________________________________   PHYSICAL EXAM:  VITAL SIGNS: ED Triage  Vitals  Enc Vitals Group     BP 07/13/18 1049 (!) 153/83     Pulse Rate 07/13/18 1049 68     Resp 07/13/18 1049 18     Temp 07/13/18 1049 97.8 F (  36.6 C)     Temp Source 07/13/18 1049 Oral     SpO2 07/13/18 1049 98 %     Weight 07/13/18 1054 162 lb (73.5 kg)     Height 07/13/18 1054 6' (1.829 m)     Head Circumference --      Peak Flow --      Pain Score --      Pain Loc --      Pain Edu? --      Excl. in [place]? --     Constitutional: Alert and oriented to self. Well appearing and in no acute distress. Eyes: Conjunctivae are normal Head: Atraumatic HEENT: No congestion/rhinnorhea. Mucous membranes are moist.  Oropharynx non-erythematous Neck:   Nontender with no meningismus, no masses, no stridor Cardiovascular: Normal rate, regular rhythm. Grossly normal heart sounds.  Good peripheral circulation. Respiratory: Normal respiratory effort.  No retractions. Lungs CTAB. Abdominal: Soft and nontender. No distention. No guarding no rebound Back:  There is no focal tenderness or step off.  there is no midline tenderness there are no lesions noted. there is no CVA tenderness Musculoskeletal: No lower extremity tenderness, no upper extremity tenderness.  Patient has strong pulses distally, legs are warm and well-perfused, I can lift up his right leg which is the leg in question, can move it in abduction and abduction in the hip, I can flex and extend the knee, flexion extend the ankle and palpate its entire length and there is no evidence of discomfort.  Compartments are soft.  Sensation appears to be intact.  No joint effusions, no DVT signs strong distal pulses no edema Neurologic:  Normal speech and language. No gross focal neurologic deficits are appreciated.  Patient able to hold his left and right leg both up with no evidence of discomfort or significant weakness relative to each other for 15 seconds bilaterally Skin:  Skin is warm, dry and intact. No rash noted. Psychiatric: Mood and  affect are normal. Speech and behavior are normal.  ____________________________________________   LABS (all labs ordered are listed, but only abnormal results are displayed)  Labs Reviewed  CBC WITH DIFFERENTIAL/PLATELET - Abnormal; Notable for the following components:      Result Value   RBC 3.66 (*)    Hemoglobin 11.7 (*)    HCT 36.4 (*)    All other components within normal limits  BASIC METABOLIC PANEL - Abnormal; Notable for the following components:   Potassium 3.4 (*)    Glucose, Bld 107 (*)    Creatinine, Ser 0.59 (*)    Calcium 8.5 (*)    All other components within normal limits  VALPROIC ACID LEVEL    Pertinent labs  results that were available during my care of the patient were reviewed by me and considered in my medical decision making (see chart for details). ____________________________________________  EKG  I personally interpreted any EKGs ordered by me or triage  ____________________________________________  RADIOLOGY  Pertinent labs & imaging results that were available during my care of the patient were reviewed by me and considered in my medical decision making (see chart for details). If possible, patient and/or family made aware of any abnormal findings.  No results found. ____________________________________________    PROCEDURES  Procedure(s) performed: None  Procedures  Critical Care performed: None  ____________________________________________   INITIAL IMPRESSION / ASSESSMENT AND PLAN / ED COURSE  Pertinent labs & imaging results that were available during my care of the patient were reviewed by me and considered in  my medical decision making (see chart for details).  Patient here with reported difficulty ambulation after frequent falls there is no evidence of acute fracture on exam however I will obtain x-ray of the hip to be on the safe side.  Certainly no knee tenderness or ankle tenderness is elicited and I have very low  suspicion for hip fracture although sometimes acetabular other fractures are a little bit more difficult to elicit when patient is not weightbearing.  Family also feel that he seems to be a little bit weaker in general although at this time he seems quite robust.  They are worried that his new Depakote medication could be causing some of this.  I will certainly get a Depakote level, however, at this time there does not appear to be any evidence of CVA to the extent that I can determine, nor is there evidence of arterial occlusion, infection, etc.  I cannot rule out radicular disease but there is no evidence of neurologic manifestation of it aside from possible pain which the patient is currently denying andwhich I cannot elicit.  ----------------------------------------- 1:56 PM on 07/13/2018 -----------------------------------------  Patient remains comfortable in the bed.  He did have a meningioma resection in 2014, he did have a head bleed in August of this year.  The family states that he has been getting gradually more generally weak since he came home from rehab.  They feel perhaps it is contributed to by his new medication of Depakote which was started in the emergency room about a week ago.  His valproic acid level is 37.  They feel it is making him weak.  They do have a neurologist at [facility] I did advise that they talk to him/her tomorrow about the patient's medication.  I do not have a clear explanation for his generalized weakness which is beginning progressively worse especially since his head injury however, I do suspect that this is a organic process related to TBI.  Patient was able to walk here with support, I did offer the family home health care but he already has home health nursing, walker, wheelchair for this, as well as a personal neurologist.  He declined further home health.  In addition, the patient has a 24-hour sitter it appears.  In any event, because of his generalized weakness  recent head injury with a head bleed return to Eliquis I will obtain a CT scan to make sure there is no hydrocephalus or other pathology causing him to feel generally weak although he is certainly nonfocal and I have low suspicion for CVA.  Is no evidence of infection is not febrile, no white count elevation at center.  This appears to be a gradually progressive pathology which I think would be best handled as an outpatient  ----------------------------------------- 2:34 PM on 07/13/2018 -----------------------------------------  Offered to keep the patient for urinalysis family would prefer to go home I do not think this is unreasonable, there is does not appear to be anything acute that I can treat today, extensive return precautions follow-up given understood.    ____________________________________________   FINAL CLINICAL IMPRESSION(S) / ED DIAGNOSES  Final diagnoses:  None      This chart was dictated using voice recognition software.  Despite best efforts to proofread,  errors can occur which can change meaning.      Schuyler Amor, MD 07/13/18 1207    Schuyler Amor, MD 07/13/18 1359    Schuyler Amor, MD 07/13/18 1434

## 2018-07-13 NOTE — ED Triage Notes (Signed)
Pt to ED via POV for recent falls and problems with right leg. Pt family states that over the last week pt has not been able to move leg, they state that when he tried to put weight on his right leg he keeps saying "ouch". Family is concerned pt may have broken something when he fell. Pt is in NAD at this time.

## 2018-07-13 NOTE — Discharge Instructions (Addendum)
Return to the emergency room for any new or worrisome symptoms, continue to support [name] to the best of your ability, and please follow closely with your primary care doctor and your neurologist tomorrow.  If you feel that there is weakness is a side effect of the Depakote he certainly may hold the Depakote until you talk to your neurologist.  If he has any seizure activity, any numbness or weakness in 1 part of his body, or any other new or worrisome symptoms please return to the emergency department.  We wish you happy new year and the best of luck

## 2018-07-13 NOTE — Progress Notes (Signed)
LCSW Product manager) received call from Dr Rolan Lipa- He is requesting Home health for this patient and wanted the care manager- to set this up. LCSW texted care manager Merrily Pew [phone], Dr Rolan Lipa Can be reached at [place]  No further SW needs  [employer] LCSW [phone]

## 2018-07-14 ENCOUNTER — Ambulatory Visit: Payer: Medicare Other

## 2018-07-14 ENCOUNTER — Ambulatory Visit: Payer: Medicare Other | Admitting: Occupational Therapy

## 2018-07-14 DIAGNOSIS — R2689 Other abnormalities of gait and mobility: Secondary | ICD-10-CM

## 2018-07-14 DIAGNOSIS — R2681 Unsteadiness on feet: Secondary | ICD-10-CM

## 2018-07-14 DIAGNOSIS — M6281 Muscle weakness (generalized): Secondary | ICD-10-CM

## 2018-07-14 NOTE — Therapy (Signed)
[place] MAIN [facility] SERVICES [address] [place], [place], [address] Phone: [phone]   Fax:  [phone]  Physical Therapy Treatment  Patient Details  Name: [name] MRN: [id number] Date of Birth: 05-26-1941 Referring Provider (PT): Lavon Paganini [place]gu[place]   Encounter Date: 07/14/2018  PT End of Session - 07/14/18 1228    Visit Number  2    Number of Visits  16    Date for PT Re-Evaluation  08/27/18    Authorization Type  2/10 eval 12/18    PT Start Time  1015    PT Stop Time  1059    PT Time Calculation (min)  44 min    Equipment Utilized During Treatment  Gait belt    Activity Tolerance  Treatment limited secondary to medical complications (Comment);Other (comment)   cognitive status   Behavior During Therapy  Impulsive;Flat affect       Past Medical History:  Diagnosis Date  . A-fib ([place])   . AICD (automatic cardioverter/defibrillator) present 05/21/2016  . Arthritis    "back of my neck extending to back of head" (05/21/2016)  . Barrett's esophagus    Gets periodic endoscopies & biopsies. Coumadin has been held in the past for these precedures.   . Basal cell carcinoma    "scalp; chest; back"  . Blurred vision   . CAD (coronary artery disease)    S/P stenting of his LAD, circumflex & marginal branch as well as his RCA in 2008.  . Cardiomyopathy ([place])    Significant ischemic cardiomyopathy. ECHO 08/27/12: EF = 45-50% (Previous Echo revealed EF = 25%)  . Carotid artery occlusion    Carotid Doppler 08/27/12 SUMMARY - Bilateral ICAs: Demonstrated normal patency without evidence of a significant diamenter reduction, tortuosity or any other vascular abnormality. This was a Normal carotid duplex Doppler Evaluation.  . Cerebral atherosclerosis    Carotid Doppler 08/27/12 SUMMARY - Bilateral ICAs: Demonstrated normal patency without evidence of a significant diamenter reduction, tortuosity or any other vascular abnormality. This was a  Normal carotid duplex Doppler Evaluation.  . CHF (congestive heart failure) ([place])   . Chronic anticoagulation    For PE & PAF  . Claudication ([place])    right ABI of 0.84  . Coronary artery disease    Most recent cath by Dr. Gwenlyn Found 07/02/09 was remarkable for patent stents w/an 80% in-stent stenosis within the obtuse marginal branch stent, as well as 60% ostial RCA stenosis which was stented with a Taxus Liberte drug-eluting stent. Nuc stress test 09/03/12 was low risk & showed an inferolateral scar. & an EF of 39%  . Daily headache    "from the arthritis in back of neck" (05/21/2016)  . DVT (deep venous thrombosis) ([place]) 09/2010   "after knee replacement"  . GERD (gastroesophageal reflux disease)   . H/O mitral valve disorder   . History of hiatal hernia   . History of kidney stones   . History of stomach ulcers   . Hyperlipidemia   . Hypertension   . Meningioma ([place]) 08/15/12   BrainLAB-guided left frontoparietal craniotomy w/removal of a meningioma.   . Obstructive sleep apnea    Sleep Study in 2011 AHI 3.2. "occasionally wear my mask" (05/21/2016)  . Paroxysmal atrial fibrillation ([facility])   . PSVT (paroxysmal supraventricular tachycardia) ([place])    2D ECHO 08/27/12: EF = 45-50% (Previous Echo revealed EF = 25%)  . Pulmonary embolism ([place]) 09/2010   "after knee replacement"  . Shortness  of breath dyspnea   . Sick sinus syndrome ([place])   . TIA (transient ischemic attack)     Past Surgical History:  Procedure Laterality Date  . BASAL CELL CARCINOMA EXCISION     "head X 2; back, chest" (05/21/2016)  . CARDIAC CATHETERIZATION     Past caths in 2008, 2006, 2005 & 2003  . CATARACT EXTRACTION W/ INTRAOCULAR LENS  IMPLANT, BILATERAL Bilateral 2005  . CORONARY ANGIOPLASTY WITH STENT PLACEMENT  07/02/09   Most recent cath by Dr. Gwenlyn Found 07/02/09 was remarkable for patent stents w/an 80% in-stent stenosis within the obtuse marginal branch stent, as well as 60% ostial RCA stenosis which was stented  with a Taxus Liberte drug-eluting stent.   . CORONARY ANGIOPLASTY WITH STENT PLACEMENT     "I have 12 stents in my heart" (05/21/2016)  . CRANIOTOMY  08/15/2012   At [facility]. BrainLAB-guided left frontoparietal craniotomy w/removal of a meningioma.   . EP IMPLANTABLE DEVICE N/A 05/21/2016   Procedure: ICD Implant;  Surgeon: Evans Lance, MD;  Location: [facility];  Service: Cardiovascular;  Laterality: N/A;  . INGUINAL HERNIA REPAIR Right 11/23/2014   Procedure: HERNIA REPAIR INGUINAL ADULT;  Surgeon: Rochel Brome, MD;  Location: [facility] ORS;  Service: General;  Laterality: Right;  . INGUINAL HERNIA REPAIR Bilateral 2010-2016  . JOINT REPLACEMENT    . THROAT SURGERY     "laser for Barret's esophagus"  . TONSILLECTOMY    . TOTAL KNEE ARTHROPLASTY Left 09/2010   Surgery was complicated by PAF with a pulmonary embolism documented by CT angiogram.    There were no vitals filed for this visit.  Subjective Assessment - 07/14/18 1006    Subjective  Patient went to ER twice since evaluation for seizure like symptoms "unresponsiveness" as well as for a fall.   Has been put on new medication and seeing the side effects of breathing and balance. Has fallen this morning and has fallen twice total per fiance's knowledge since last week.  Patients family interested in home health due to high fall risk and unstable health.     Pertinent History  Patient is a pleasant 77 year old male who was admitted to hospital on 03/08/18 after TBI post fall that was treated with EVD. He had a prolonged hospitalization that required a PEG tube for nutrionary support and foley. He was then sent to [facility] on 04/03/18 and had an episode on 04/23/18 where he was found unresponsive and taken to the hospital. Upon this stay patient had significant issues per physician reports with orthostasis and exertional tachyarrhytmias due to A fib with  RVR.  Patient was transferred to CIR on 05/15/18 for PT, ST, and OT at least 3  hours, 5 days a week.  Patient discharged from [facility] on 12/17. Extensive TBI rehab program performed. Prior to hospitalization patient was independent with mobility and lived with significant other in home.  Patient was in rehab for frequent falls, is on elliquis. PMH includes: A fib, AICD, arthritis, Barrett's esophagus, Basal cell carcinoma, blurred vision, CAD, Cardiomyopathy, carotid artery occlusion, cerebral atherosclerosis, CHF, claudication, CAD, DVT, GERD, HLD, HTN, Meningioma, PE, TIA, seizures, ICD in place, and COPD.  Has absent seizures: blank stare with longest for hour and half. Patient is at home now and is supposed to use rollator. Fiance reports R leg stiffnes and "locks up on him" causing him to fall.     Limitations  Standing;Walking;House hold activities;Other (comment);Lifting;Sitting  How long can you stand comfortably?  loses his balance as soon as he stands up    How long can you walk comfortably?  loses stability immediately     Patient Stated Goals  improve RLE strength, core, balance    Currently in Pain?  No/denies       Patient went to ER twice since evaluation for seizure like symptoms "unresponsiveness" as well as for a fall.   Has been put on new medication and seeing the side effects of breathing and balance. Has fallen this morning and has fallen twice total per fiance's knowledge since last week.  Patients family interested in home health due to high fall risk and unstable health.      Stand pivot transfer with walker from transport chair to plinth table, Mod  A for walker placement and upright position, requiring Mod- Max A for final postioning due to starting to sit to early not following verbal or tactile commands.   Seated on plinth table:   -soccer ball kicks for quadriceps activation, focus on reaction timing and coordination. Patient perseverates greatly and takes ~2 minutes to return to task at hand; Mod A to maintain upright posture  during kick.   -high knees : knee ball with  Cueing for sequencing, timing and amplitude of lift , max verbal cueing.   -balloon taps; only able to perform with one UE at a time due to requiring opp UE for support/stability. Reaching inside and outside BOS  Sit to stand from plinth table with RW and CGA. Requires tactile cueing to abdomen, gluteals, and trunk for sequencing of contraction for full movement, excessive posterior weight shift with toes up off ground, right leg further posterior than left                   PT Education - 07/14/18 1226    Education Details  patient awareness, Home Health     Person(s) Educated  Patient    Methods  Explanation;Demonstration    Comprehension  Verbalized understanding;Returned demonstration       PT Short Term Goals - 07/02/18 1633      PT SHORT TERM GOAL #1   Title  Patient will be independent in home exercise program to improve strength/mobility for better functional independence with ADLs.    Baseline  HEP given     Time  4    Period  Weeks    Status  New    Target Date  07/30/18      PT SHORT TERM GOAL #2   Title  Patient will perform a sit to stand independently to increase independence with ADL performance and decrease fall risk    Baseline  12/18: mod A     Time  4    Period  Weeks    Status  New    Target Date  07/30/18      PT SHORT TERM GOAL #3   Title  Patient will demonstrate safe usage of rollator for household mobility to decrease fall risk.     Baseline  12/18: unaware of how to lock rollator, unable to demonstrate carryover    Time  4    Period  Weeks    Status  New    Target Date  07/30/18        PT Long Term Goals - 07/02/18 1634      PT LONG TERM GOAL #1   Title  Patient (> 47 years old) will complete five  times sit to stand test in < 30 seconds indicating an increased LE strength and improved balance.    Baseline  12/18; requires Mod A for 1 STS    Time  8    Period  Weeks    Status  New     Target Date  08/27/18      PT LONG TERM GOAL #2   Title  Patient will demonstrate ability to ambulate 100 ft independently without LOB to increase household mobility     Baseline  12/18: unable to perform >6 ft with rollator     Time  8    Period  Weeks    Status  New    Target Date  08/27/18      PT LONG TERM GOAL #3   Title  Patient will demonstrate ability to stand with minimal UE support for >10 minutes independently for performance of ADLs    Baseline  12/18: requires Mod A     Time  8    Period  Weeks    Status  New    Target Date  08/27/18      PT LONG TERM GOAL #4   Title  Patient will increase BLE gross strength to 4+/5 as to improve functional strength for independent gait, increased standing tolerance and increased ADL ability.    Baseline  12/18: 3/5 gross assessment    Time  8    Period  Weeks    Status  New    Target Date  08/27/18      PT LONG TERM GOAL #5   Title  Patient will deny any falls over past 4 weeks to demonstrate improved safety awareness at home and work.     Baseline  12/18: falls frequently     Time  8    Period  Weeks    Status  New    Target Date  08/27/18            Plan - 07/14/18 1231    Clinical Impression Statement  Due to patient's unstable health, as seen in the two ER visits since patient was last seen, as well as his limited orientation to task and poor safety awareness the option of home health was discussed with his family and caregiver. [name] family stated they will see their physician today for further discussion of home health vs outpatient. Patient would be a good candidate for home health due to poor safety in his home environment and limited ability to transfer/mobilize. I will be happy to continue to see this patient if outpatient is decided upon.     Rehab Potential  Fair    Clinical Impairments Affecting Rehab Potential  (+) good family support, high previous level of funciton (-) age, frequent falls in short  amount of time    PT Frequency  2x / week    PT Duration  8 weeks    PT Treatment/Interventions  ADLs/Self Care Home Management;Aquatic Therapy;Moist Heat;Electrical Stimulation;Cryotherapy;Ultrasound;DME Instruction;Gait training;Therapeutic exercise;Therapeutic activities;Functional mobility training;Stair training;Balance training;Neuromuscular re-education;Cognitive remediation;Patient/family education;Manual techniques;Wheelchair mobility training;Orthotic Fit/Training;Passive range of motion;Vestibular;Taping;Splinting;Energy conservation;Dry needling;Visual/perceptual remediation/compensation    PT Next Visit Plan  review HEP, standing in // bars    PT Home Exercise Plan  see sheet    Consulted and Agree with Plan of Care  Patient;Family member/caregiver    Family Member Consulted  daughter, fiance       Patient will benefit from skilled therapeutic intervention in order to improve the following deficits and  impairments:  Abnormal gait, Cardiopulmonary status limiting activity, Decreased activity tolerance, Decreased coordination, Decreased cognition, Decreased balance, Decreased endurance, Decreased knowledge of precautions, Decreased knowledge of use of DME, Decreased safety awareness, Decreased range of motion, Decreased mobility, Decreased strength, Difficulty walking, Impaired perceived functional ability, Impaired flexibility, Improper body mechanics, Postural dysfunction  Visit Diagnosis: Other abnormalities of gait and mobility  Unsteadiness on feet  Muscle weakness (generalized)     Problem List Patient Active Problem List   Diagnosis Date Noted  . Seizures ([place])   . Acute blood loss anemia   . Chronic congestive heart failure ([place])   . TBI (traumatic brain injury) ([place]) 05/15/2018  . ICD (implantable cardioverter-defibrillator) in place 05/21/2016  . 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 Claudication of right lower extremity ([place]) 10/04/2015  . Preop cardiovascular  exam 11/16/2014  . Palpitations 08/13/2013  . Chronic atrial fibrillation ([place]) 03/26/2013  . Hyperlipidemia 03/26/2013  . Barrett's esophagus 03/26/2013  . Obstructive sleep apnea 03/26/2013  . Cardiomyopathy, ischemic 12/19/2012  . HTN (hypertension) 12/19/2012  . Hx pulmonary embolism 12/19/2012  . NSVT (nonsustained ventricular tachycardia) ([place]) 10/23/2012  . CAD S/P multiple PCIs 10/23/2012  . PSVT (paroxysmal supraventricular tachycardia) ([place]) 09/30/2012  . Long term (current) use of anticoagulants 09/30/2012    Janna Arch, PT, DPT   07/14/2018, 12:32 PM  [place] MAIN [facility] SERVICES [address] [place], [place], [address] Phone: [phone]   Fax:  [phone]  Name: CASIMIR BARCELLOS MRN: [id number] Date of Birth: May 31, 1941

## 2018-07-15 ENCOUNTER — Inpatient Hospital Stay: Payer: Medicare Other

## 2018-07-15 ENCOUNTER — Encounter: Payer: Self-pay | Admitting: Cardiology

## 2018-07-15 ENCOUNTER — Emergency Department: Payer: Medicare Other

## 2018-07-15 ENCOUNTER — Other Ambulatory Visit: Payer: Self-pay

## 2018-07-15 ENCOUNTER — Inpatient Hospital Stay
Admission: EM | Admit: 2018-07-15 | Discharge: 2018-07-16 | DRG: 101 | Disposition: A | Discharge status: Home / Self Care | Payer: Medicare Other | Attending: Specialist | Admitting: Specialist

## 2018-07-15 DIAGNOSIS — M4692 Unspecified inflammatory spondylopathy, cervical region: Secondary | ICD-10-CM | POA: Diagnosis not present

## 2018-07-15 DIAGNOSIS — F0390 Unspecified dementia without behavioral disturbance: Secondary | ICD-10-CM | POA: Diagnosis present

## 2018-07-15 DIAGNOSIS — N39498 Other specified urinary incontinence: Secondary | ICD-10-CM | POA: Diagnosis present

## 2018-07-15 DIAGNOSIS — G4733 Obstructive sleep apnea (adult) (pediatric): Secondary | ICD-10-CM | POA: Diagnosis not present

## 2018-07-15 DIAGNOSIS — Z8711 Personal history of peptic ulcer disease: Secondary | ICD-10-CM

## 2018-07-15 DIAGNOSIS — Z961 Presence of intraocular lens: Secondary | ICD-10-CM | POA: Diagnosis present

## 2018-07-15 DIAGNOSIS — G40409 Other generalized epilepsy and epileptic syndromes, not intractable, without status epilepticus: Principal | ICD-10-CM | POA: Diagnosis present

## 2018-07-15 DIAGNOSIS — Z87442 Personal history of urinary calculi: Secondary | ICD-10-CM

## 2018-07-15 DIAGNOSIS — I5022 Chronic systolic (congestive) heart failure: Secondary | ICD-10-CM | POA: Diagnosis not present

## 2018-07-15 DIAGNOSIS — J439 Emphysema, unspecified: Secondary | ICD-10-CM | POA: Diagnosis present

## 2018-07-15 DIAGNOSIS — I110 Hypertensive heart disease with heart failure: Secondary | ICD-10-CM | POA: Diagnosis present

## 2018-07-15 DIAGNOSIS — R451 Restlessness and agitation: Secondary | ICD-10-CM

## 2018-07-15 DIAGNOSIS — Z803 Family history of malignant neoplasm of breast: Secondary | ICD-10-CM

## 2018-07-15 DIAGNOSIS — I2510 Atherosclerotic heart disease of native coronary artery without angina pectoris: Secondary | ICD-10-CM | POA: Diagnosis not present

## 2018-07-15 DIAGNOSIS — R443 Hallucinations, unspecified: Secondary | ICD-10-CM | POA: Diagnosis present

## 2018-07-15 DIAGNOSIS — Z79890 Hormone replacement therapy (postmenopausal): Secondary | ICD-10-CM

## 2018-07-15 DIAGNOSIS — I480 Paroxysmal atrial fibrillation: Secondary | ICD-10-CM | POA: Diagnosis present

## 2018-07-15 DIAGNOSIS — N401 Enlarged prostate with lower urinary tract symptoms: Secondary | ICD-10-CM | POA: Diagnosis present

## 2018-07-15 DIAGNOSIS — Z96652 Presence of left artificial knee joint: Secondary | ICD-10-CM | POA: Diagnosis present

## 2018-07-15 DIAGNOSIS — R4182 Altered mental status, unspecified: Secondary | ICD-10-CM

## 2018-07-15 DIAGNOSIS — I495 Sick sinus syndrome: Secondary | ICD-10-CM | POA: Diagnosis not present

## 2018-07-15 DIAGNOSIS — Z86011 Personal history of benign neoplasm of the brain: Secondary | ICD-10-CM

## 2018-07-15 DIAGNOSIS — Z95810 Presence of automatic (implantable) cardiac defibrillator: Secondary | ICD-10-CM

## 2018-07-15 DIAGNOSIS — K2270 Barrett's esophagus without dysplasia: Secondary | ICD-10-CM | POA: Diagnosis not present

## 2018-07-15 DIAGNOSIS — R569 Unspecified convulsions: Secondary | ICD-10-CM | POA: Diagnosis not present

## 2018-07-15 DIAGNOSIS — Z8249 Family history of ischemic heart disease and other diseases of the circulatory system: Secondary | ICD-10-CM

## 2018-07-15 DIAGNOSIS — Z9842 Cataract extraction status, left eye: Secondary | ICD-10-CM

## 2018-07-15 DIAGNOSIS — I255 Ischemic cardiomyopathy: Secondary | ICD-10-CM | POA: Diagnosis present

## 2018-07-15 DIAGNOSIS — Z86718 Personal history of other venous thrombosis and embolism: Secondary | ICD-10-CM

## 2018-07-15 DIAGNOSIS — R4701 Aphasia: Secondary | ICD-10-CM | POA: Diagnosis not present

## 2018-07-15 DIAGNOSIS — Z87891 Personal history of nicotine dependence: Secondary | ICD-10-CM

## 2018-07-15 DIAGNOSIS — K219 Gastro-esophageal reflux disease without esophagitis: Secondary | ICD-10-CM | POA: Diagnosis not present

## 2018-07-15 DIAGNOSIS — Z9841 Cataract extraction status, right eye: Secondary | ICD-10-CM

## 2018-07-15 DIAGNOSIS — Z818 Family history of other mental and behavioral disorders: Secondary | ICD-10-CM

## 2018-07-15 DIAGNOSIS — Z955 Presence of coronary angioplasty implant and graft: Secondary | ICD-10-CM

## 2018-07-15 DIAGNOSIS — Z85828 Personal history of other malignant neoplasm of skin: Secondary | ICD-10-CM

## 2018-07-15 DIAGNOSIS — Z79899 Other long term (current) drug therapy: Secondary | ICD-10-CM

## 2018-07-15 DIAGNOSIS — Z7901 Long term (current) use of anticoagulants: Secondary | ICD-10-CM

## 2018-07-15 DIAGNOSIS — E785 Hyperlipidemia, unspecified: Secondary | ICD-10-CM | POA: Diagnosis not present

## 2018-07-15 DIAGNOSIS — Z8673 Personal history of transient ischemic attack (TIA), and cerebral infarction without residual deficits: Secondary | ICD-10-CM

## 2018-07-15 DIAGNOSIS — Z7982 Long term (current) use of aspirin: Secondary | ICD-10-CM

## 2018-07-15 DIAGNOSIS — Z86711 Personal history of pulmonary embolism: Secondary | ICD-10-CM

## 2018-07-15 LAB — COMPREHENSIVE METABOLIC PANEL
ALBUMIN: 3.8 g/dL (ref 3.5–5.0)
ALT: 14 U/L (ref 0–44)
AST: 26 U/L (ref 15–41)
Alkaline Phosphatase: 65 U/L (ref 38–126)
Anion gap: 9 (ref 5–15)
BUN: 14 mg/dL (ref 8–23)
CO2: 25 mmol/L (ref 22–32)
Calcium: 8.9 mg/dL (ref 8.9–10.3)
Chloride: 105 mmol/L (ref 98–111)
Creatinine, Ser: 0.57 mg/dL — ABNORMAL LOW (ref 0.61–1.24)
GFR calc Af Amer: >60 mL/min (ref >60)
GFR calc non Af Amer: >60 mL/min (ref >60)
GLUCOSE: 130 mg/dL — AB (ref 70–99)
Potassium: 3.7 mmol/L (ref 3.5–5.1)
Sodium: 139 mmol/L (ref 135–145)
Total Bilirubin: 0.7 mg/dL (ref 0.3–1.2)
Total Protein: 7.1 g/dL (ref 6.5–8.1)

## 2018-07-15 LAB — GASTROINTESTINAL PANEL BY PCR, STOOL (REPLACES STOOL CULTURE)
ASTROVIRUS: NOT DETECTED
Adenovirus F40/41: NOT DETECTED
CAMPYLOBACTER SPECIES: NOT DETECTED
Cryptosporidium: NOT DETECTED
Cyclospora cayetanensis: NOT DETECTED
ENTEROTOXIGENIC E COLI (ETEC): NOT DETECTED
Entamoeba histolytica: NOT DETECTED
Enteroaggregative E coli (EAEC): NOT DETECTED
Enteropathogenic E coli (EPEC): NOT DETECTED
Giardia lamblia: NOT DETECTED
Norovirus GI/GII: NOT DETECTED
PLESIMONAS SHIGELLOIDES: NOT DETECTED
Rotavirus A: NOT DETECTED
SHIGA LIKE TOXIN PRODUCING E COLI (STEC): NOT DETECTED
Salmonella species: NOT DETECTED
Sapovirus (I, II, IV, and V): NOT DETECTED
Shigella/Enteroinvasive E coli (EIEC): NOT DETECTED
Vibrio cholerae: NOT DETECTED
Vibrio species: NOT DETECTED
Yersinia enterocolitica: NOT DETECTED

## 2018-07-15 LAB — CBC
HCT: 37.2 % — ABNORMAL LOW (ref 39.0–52.0)
Hemoglobin: 12.1 g/dL — ABNORMAL LOW (ref 13.0–17.0)
MCH: 32 pg (ref 26.0–34.0)
MCHC: 32.5 g/dL (ref 30.0–36.0)
MCV: 98.4 fL (ref 80.0–100.0)
Platelets: 155 10*3/uL (ref 150–400)
RBC: 3.78 MIL/uL — AB (ref 4.22–5.81)
RDW: 14.6 % (ref 11.5–15.5)
WBC: 5 10*3/uL (ref 4.0–10.5)
nRBC: 0 % (ref 0.0–0.2)

## 2018-07-15 LAB — VALPROIC ACID LEVEL: Valproic Acid Lvl: <10 ug/mL — ABNORMAL LOW (ref 50.0–100.0)

## 2018-07-15 MED ORDER — ADULT MULTIVITAMIN W/MINERALS CH
1.0000 | ORAL_TABLET | Freq: Every day | ORAL | Status: DC
  Administered 2018-07-16: 11:00:00 1 via ORAL
  Filled 2018-07-15: qty 1

## 2018-07-15 MED ORDER — ACETAMINOPHEN 500 MG PO TABS: 500.0000 mg | ORAL_TABLET | Freq: Four times a day (QID) | ORAL | Status: DC | PRN

## 2018-07-15 MED ORDER — TAMSULOSIN HCL 0.4 MG PO CAPS
0.4000 mg | ORAL_CAPSULE | Freq: Every day | ORAL | Status: DC
  Administered 2018-07-15 – 2018-07-16 (×2): 0.4 mg via ORAL
  Filled 2018-07-15 (×2): qty 1

## 2018-07-15 MED ORDER — SODIUM CHLORIDE 0.9 % IV SOLN
200.0000 mg | Freq: Two times a day (BID) | INTRAVENOUS | Status: DC
  Filled 2018-07-15: qty 20

## 2018-07-15 MED ORDER — ACETAMINOPHEN 325 MG PO TABS: 650.0000 mg | ORAL_TABLET | Freq: Four times a day (QID) | ORAL | Status: DC | PRN

## 2018-07-15 MED ORDER — APIXABAN 5 MG PO TABS
5.0000 mg | ORAL_TABLET | Freq: Two times a day (BID) | ORAL | Status: DC
  Administered 2018-07-15 – 2018-07-16 (×3): 5 mg via ORAL
  Filled 2018-07-15 (×3): qty 1

## 2018-07-15 MED ORDER — FINASTERIDE 5 MG PO TABS
5.0000 mg | ORAL_TABLET | Freq: Every day | ORAL | Status: DC
  Administered 2018-07-15: 21:00:00 5 mg via ORAL
  Filled 2018-07-15: qty 1

## 2018-07-15 MED ORDER — TRAZODONE HCL 50 MG PO TABS
50.0000 mg | ORAL_TABLET | Freq: Every day | ORAL | Status: DC
  Administered 2018-07-15: 50 mg via ORAL
  Filled 2018-07-15: qty 1

## 2018-07-15 MED ORDER — QUETIAPINE FUMARATE 25 MG PO TABS
12.5000 mg | ORAL_TABLET | Freq: Two times a day (BID) | ORAL | Status: DC
  Administered 2018-07-15 – 2018-07-16 (×2): 12.5 mg via ORAL
  Filled 2018-07-15 (×2): qty 1

## 2018-07-15 MED ORDER — QUETIAPINE FUMARATE 25 MG PO TABS
12.5000 mg | ORAL_TABLET | Freq: Once | ORAL | Status: DC
  Filled 2018-07-15: qty 1

## 2018-07-15 MED ORDER — VITAMIN B-12 1000 MCG PO TABS
1000.0000 ug | ORAL_TABLET | Freq: Every day | ORAL | Status: DC
  Administered 2018-07-16: 1000 ug via ORAL
  Filled 2018-07-15: qty 1

## 2018-07-15 MED ORDER — MELATONIN 5 MG PO TABS
5.0000 mg | ORAL_TABLET | Freq: Every day | ORAL | Status: DC
  Filled 2018-07-15 (×2): qty 1

## 2018-07-15 MED ORDER — PANTOPRAZOLE SODIUM 40 MG PO TBEC
40.0000 mg | DELAYED_RELEASE_TABLET | Freq: Every day | ORAL | Status: DC
  Administered 2018-07-16: 11:00:00 40 mg via ORAL
  Filled 2018-07-15: qty 1

## 2018-07-15 MED ORDER — SODIUM CHLORIDE 0.9 % IV BOLUS
1000.0000 mL | Freq: Once | INTRAVENOUS | Status: AC
  Administered 2018-07-15: 1000 mL via INTRAVENOUS

## 2018-07-15 MED ORDER — ACETAMINOPHEN 650 MG RE SUPP: 650.0000 mg | Freq: Four times a day (QID) | RECTAL | Status: DC | PRN

## 2018-07-15 MED ORDER — POLYVINYL ALCOHOL 1.4 % OP SOLN
1.0000 [drp] | OPHTHALMIC | Status: DC | PRN
  Filled 2018-07-15: qty 15

## 2018-07-15 MED ORDER — ONDANSETRON HCL 4 MG/2ML IJ SOLN: 4.0000 mg | Freq: Four times a day (QID) | INTRAMUSCULAR | Status: DC | PRN

## 2018-07-15 MED ORDER — ASPIRIN EC 81 MG PO TBEC
81.0000 mg | DELAYED_RELEASE_TABLET | Freq: Every day | ORAL | Status: DC
  Administered 2018-07-15: 81 mg via ORAL
  Filled 2018-07-15: qty 1

## 2018-07-15 MED ORDER — ONDANSETRON HCL 4 MG PO TABS: 4.0000 mg | ORAL_TABLET | Freq: Four times a day (QID) | ORAL | Status: DC | PRN

## 2018-07-15 MED ORDER — QUETIAPINE FUMARATE 25 MG PO TABS: 12.5000 mg | ORAL_TABLET | Freq: Two times a day (BID) | ORAL | Status: DC

## 2018-07-15 MED ORDER — VALPROATE SODIUM 500 MG/5ML IV SOLN
500.0000 mg | Freq: Two times a day (BID) | INTRAVENOUS | Status: DC
  Filled 2018-07-15 (×3): qty 5

## 2018-07-15 MED ORDER — METOPROLOL TARTRATE 50 MG PO TABS
50.0000 mg | ORAL_TABLET | Freq: Two times a day (BID) | ORAL | Status: DC
  Administered 2018-07-15 – 2018-07-16 (×3): 50 mg via ORAL
  Filled 2018-07-15 (×3): qty 1

## 2018-07-15 NOTE — ED Notes (Signed)
Patient incontinenet of b/b, peri care performed. CT tech to pick patient up for head ct. Family at bedside.

## 2018-07-15 NOTE — ED Notes (Signed)
Patient placed on enteric precautions. Patient had 2 large bowel movements that were solid and formed, brown in color. Patient has not had any loose stools since received in ED. Will continue to monitor. Peri care provided. Patient incontinent of bladder. Repositioned for comfort. Vss. Family at bedside.

## 2018-07-15 NOTE — ED Notes (Addendum)
Schedule po meds given. Patient was able to hold med cup and take his pills, swallowed with out difficulty. Patient off unit to EEG.

## 2018-07-15 NOTE — Progress Notes (Signed)
eeg completed ° °

## 2018-07-15 NOTE — Therapy (Signed)
[place] MAIN [facility] SERVICES [address] [place], [place], [address] Phone: [phone]   Fax:  [phone]  Patient Details  Name: SATCHEL HEIDINGER MRN: [id number] Date of Birth: 1940/10/31 Referring Provider:  Cathlyn Parsons, PA-C  Encounter Date: 07/14/2018   Patient arrived for OT evaluation, caregiver and girlfriend present and sister who is POA on the phone.  Patient was seen in the ER the day before and was recommended for  Home health services.  Family report patient fell this am at home and it took 3 people to get him up and it also took 3 people to get him out of the house today.  Patient has an appt with primary care MD today and they would like to defer OT evaluation until he is seen for appt and plan to pursue [place].   Once patient has home health and is able to attend OP services we would be glad to evaluate patient at that time.     Jakob Kimberlin, OTR/L, CLT 07/15/2018, 9:17 AM  [place] MAIN [facility] SERVICES [address] [place], [place], [address] Phone: [phone]   Fax:  [phone]

## 2018-07-15 NOTE — ED Notes (Signed)
Patient postictal family at bedside. SZ precautions maintained.

## 2018-07-15 NOTE — Consult Note (Signed)
Reason for Consult:Seizure Referring Physician: [place]  CC: Seizure  HPI: [name] is an 77 y.o. male with a history of TBI and seizures who is currently being cared for at home with 24-7 care who was noted this morning to have what is described as a generalized TC seizure.  Patient at baseline ambulates with a walker but requires close assistance.  Is incontinent and requires assistance with most ADL's.  Has a global aphasia.  Has hallucinations.  Has been maintained on Vimpat.  Had a seizure about 2 weeks ago and again on Sunday.  Has recently had his Depakote discontinued due to concern for medication side effects.  Patient currently not back to his baseline prior to seizure-more confused and agitated.    Past Medical History:  Diagnosis Date  . A-fib ([place])   . AICD (automatic cardioverter/defibrillator) present 05/21/2016  . Arthritis    "back of my neck extending to back of head" (05/21/2016)  . Barrett's esophagus    Gets periodic endoscopies & biopsies. Coumadin has been held in the past for these precedures.   . Basal cell carcinoma    "scalp; chest; back"  . Blurred vision   . CAD (coronary artery disease)    S/P stenting of his LAD, circumflex & marginal branch as well as his RCA in 2008.  . Cardiomyopathy ([place])    Significant ischemic cardiomyopathy. ECHO 08/27/12: EF = 45-50% (Previous Echo revealed EF = 25%)  . Carotid artery occlusion    Carotid Doppler 08/27/12 SUMMARY - Bilateral ICAs: Demonstrated normal patency without evidence of a significant diamenter reduction, tortuosity or any other vascular abnormality. This was a Normal carotid duplex Doppler Evaluation.  . Cerebral atherosclerosis    Carotid Doppler 08/27/12 SUMMARY - Bilateral ICAs: Demonstrated normal patency without evidence of a significant diamenter reduction, tortuosity or any other vascular abnormality. This was a Normal carotid duplex Doppler Evaluation.  . CHF (congestive heart failure) ([place])   .  Chronic anticoagulation    For PE & PAF  . Claudication ([place])    right ABI of 0.84  . Coronary artery disease    Most recent cath by Dr. Gwenlyn Found 07/02/09 was remarkable for patent stents w/an 80% in-stent stenosis within the obtuse marginal branch stent, as well as 60% ostial RCA stenosis which was stented with a Taxus Liberte drug-eluting stent. Nuc stress test 09/03/12 was low risk & showed an inferolateral scar. & an EF of 39%  . Daily headache    "from the arthritis in back of neck" (05/21/2016)  . DVT (deep venous thrombosis) ([place]) 09/2010   "after knee replacement"  . GERD (gastroesophageal reflux disease)   . H/O mitral valve disorder   . History of hiatal hernia   . History of kidney stones   . History of stomach ulcers   . Hyperlipidemia   . Hypertension   . Meningioma ([place]) 08/15/12   BrainLAB-guided left frontoparietal craniotomy w/removal of a meningioma.   . Obstructive sleep apnea    Sleep Study in 2011 AHI 3.2. "occasionally wear my mask" (05/21/2016)  . Paroxysmal atrial fibrillation ([facility])   . PSVT (paroxysmal supraventricular tachycardia) ([place])    2D ECHO 08/27/12: EF = 45-50% (Previous Echo revealed EF = 25%)  . Pulmonary embolism ([place]) 09/2010   "after knee replacement"  . Shortness of breath dyspnea   . Sick sinus syndrome ([place])   . TIA (transient ischemic attack)     Past Surgical History:  Procedure Laterality Date  . BASAL  CELL CARCINOMA EXCISION     "head X 2; back, chest" (05/21/2016)  . CARDIAC CATHETERIZATION     Past caths in 2008, 2006, 2005 & 2003  . CATARACT EXTRACTION W/ INTRAOCULAR LENS  IMPLANT, BILATERAL Bilateral 2005  . CORONARY ANGIOPLASTY WITH STENT PLACEMENT  07/02/09   Most recent cath by Dr. Gwenlyn Found 07/02/09 was remarkable for patent stents w/an 80% in-stent stenosis within the obtuse marginal branch stent, as well as 60% ostial RCA stenosis which was stented with a Taxus Liberte drug-eluting stent.   . CORONARY ANGIOPLASTY WITH STENT PLACEMENT      "I have 12 stents in my heart" (05/21/2016)  . CRANIOTOMY  08/15/2012   At [facility]. BrainLAB-guided left frontoparietal craniotomy w/removal of a meningioma.   . EP IMPLANTABLE DEVICE N/A 05/21/2016   Procedure: ICD Implant;  Surgeon: Evans Lance, MD;  Location: [facility];  Service: Cardiovascular;  Laterality: N/A;  . INGUINAL HERNIA REPAIR Right 11/23/2014   Procedure: HERNIA REPAIR INGUINAL ADULT;  Surgeon: Rochel Brome, MD;  Location: [facility] ORS;  Service: General;  Laterality: Right;  . INGUINAL HERNIA REPAIR Bilateral 2010-2016  . JOINT REPLACEMENT    . THROAT SURGERY     "laser for Barret's esophagus"  . TONSILLECTOMY    . TOTAL KNEE ARTHROPLASTY Left 09/2010   Surgery was complicated by PAF with a pulmonary embolism documented by CT angiogram.    Family History  Problem Relation Age of Onset  . Cancer Mother   . Cardiomyopathy Sister   . Cancer Other        Breast  . Suicidality Father   . Anxiety disorder Father   . Schizophrenia Brother     Social History:  reports that he quit smoking about 40 years ago. His smoking use included cigarettes. He has a 35.00 pack-year smoking history. He quit smokeless tobacco use about 31 years ago. He reports current alcohol use of about 28.0 standard drinks of alcohol per week. He reports that he does not use drugs.  No Known Allergies  Medications: I have reviewed the patient's current medications. Prior to Admission:  Prior to Admission medications   Medication Sig Start Date End Date Taking? Authorizing Provider  acetaminophen (TYLENOL) 500 MG tablet Take 500 mg by mouth every 6 (six) hours as needed (for pain.).     [provider]  apixaban (ELIQUIS) 5 MG TABS tablet Take 1 tablet (5 mg total) by mouth 2 (two) times daily. 06/11/18   Love, Ivan Anchors, PA-C  aspirin EC 81 MG tablet Take 81 mg by mouth daily.    [provider]  docusate sodium (COLACE) 100 MG capsule Take 1 capsule (100 mg total) by mouth  daily. 06/11/18   Love, Ivan Anchors, PA-C  finasteride (PROSCAR) 5 MG tablet Take 1 tablet (5 mg total) by mouth at bedtime. 06/11/18   Love, Ivan Anchors, PA-C  lacosamide (VIMPAT) 200 MG TABS tablet Take 1 tablet (200 mg total) by mouth 2 (two) times daily. 06/11/18   Love, Ivan Anchors, PA-C  Melatonin 3 MG TABS Take 2 tablets (6 mg total) by mouth at bedtime. 06/11/18   Love, Ivan Anchors, PA-C  metoprolol tartrate (LOPRESSOR) 25 MG tablet Take 50 mg by mouth 2 (two) times daily.     [provider]  Multiple Vitamins-Minerals (MULTIVITAMIN ADULT) TABS Take 1 tablet by mouth daily. 04/03/18   [provider]  pantoprazole (PROTONIX) 40 MG tablet Take 1 tablet (40 mg total) by mouth daily.  06/11/18   Love, Ivan Anchors, PA-C  polyethylene glycol (MIRALAX / GLYCOLAX) packet Take 17 g by mouth daily. 06/11/18   Love, Ivan Anchors, PA-C  polyvinyl alcohol (LIQUIFILM TEARS) 1.4 % ophthalmic solution Place 1 drop into both eyes as needed for dry eyes.     [provider]  QUEtiapine (SEROQUEL) 25 MG tablet Take 0.5 tablets (12.5 mg total) by mouth 2 (two) times daily as needed (agitation). 06/11/18   Love, Ivan Anchors, PA-C  QUEtiapine (SEROQUEL) 25 MG tablet Take 0.5 tablets (12.5 mg total) by mouth 2 (two) times daily. At 4 pm and 10 pm as well as prn. 06/11/18   Love, Ivan Anchors, PA-C  tamsulosin (FLOMAX) 0.4 MG CAPS capsule Take 1 capsule (0.4 mg total) by mouth daily after supper. 06/11/18   Love, Ivan Anchors, PA-C  traZODone (DESYREL) 50 MG tablet Take 1 tablet (50 mg total) by mouth at bedtime. 06/11/18   Love, Ivan Anchors, PA-C  valproic acid (DEPAKENE) 250 MG capsule Take 1 capsule (250 mg total) by mouth 2 (two) times daily for 26 days. 07/04/18 07/30/18  Langston Masker B, PA-C  vitamin B-12 (CYANOCOBALAMIN) 1000 MCG tablet Take 1,000 mcg by mouth daily.    [provider]    ROS: Patient unable to provide due to aphasia  Physical Examination: Blood pressure (!) 147/112, pulse 91,  temperature 97.7 F (36.5 C), temperature source Oral, resp. rate 18, height (!) 6" (0.152 m), weight 73 kg, SpO2 96 %.  HEENT-  Normocephalic, no lesions, without obvious abnormality.  Normal external eye and conjunctiva.  Normal TM's bilaterally.  Normal auditory canals and external ears. Normal external nose, mucus membranes and septum.  Normal pharynx. Cardiovascular- S1, S2 normal, pulses palpable throughout   Lungs- chest clear, no wheezing, rales, normal symmetric air entry Abdomen- soft, non-tender; bowel sounds normal; no masses,  no organomegaly Extremities- no edema Lymph-no adenopathy palpable Musculoskeletal-no joint tenderness, deformity or swelling Skin-warm and dry, no hyperpigmentation, vitiligo, or suspicious lesions  Neurological Examination   Mental Status: Alert.  No speech other than an occasional "yes".  Does not follow commands.  Agitated at times.   Cranial Nerves: II: Discs flat bilaterally; Blinks to bilateral confrontationl, pupils equal, round, reactive to light and accommodation III,IV, VI: ptosis not present, extra-ocular motions intact bilaterally V,VII: smile symmetric, facial light touch sensation normal bilaterally VIII: hearing normal bilaterally IX,X: gag reflex present XI: bilateral shoulder shrug XII: midline tongue extension Motor: Increased tone on the left.  Does not use the RLE as well as the LLE but able to lift all extremities against gravity.   Sensory: Responds to stimuli throughout Deep Tendon Reflexes: 2+ in the upper extremities, 2+ at rigiht KJ and absent at the ankles and left KJ. Plantars: Right: mute   Left: mute Cerebellar: Unable to perform due to ability to follow commands. Gait: not tested due to safety concerns    Laboratory Studies:   Basic Metabolic Panel: Recent Labs  Lab 07/13/18 1057 07/15/18 1127  NA 142 139  K 3.4* 3.7  CL 109 105  CO2 26 25  GLUCOSE 107* 130*  BUN 12 14  CREATININE 0.59* 0.57*  CALCIUM  8.5* 8.9    Liver Function Tests: Recent Labs  Lab 07/15/18 1127  AST 26  ALT 14  ALKPHOS 65  BILITOT 0.7  PROT 7.1  ALBUMIN 3.8   No results for input(s): LIPASE, AMYLASE in the last 168 hours. No results for input(s): AMMONIA in the last  168 hours.  CBC: Recent Labs  Lab 07/13/18 1057 07/15/18 1127  WBC 4.7 5.0  NEUTROABS 3.3  --   HGB 11.7* 12.1*  HCT 36.4* 37.2*  MCV 99.5 98.4  PLT 160 155    Cardiac Enzymes: No results for input(s): CKTOTAL, CKMB, CKMBINDEX, TROPONINI in the last 168 hours.  BNP: Invalid input(s): POCBNP  CBG: No results for input(s): GLUCAP in the last 168 hours.  Microbiology: Results for orders placed or performed during the hospital encounter of 05/15/18  Culture, Urine     Status: Abnormal   Collection Time: 05/15/18 11:26 PM  Result Value Ref Range Status   Specimen Description URINE, RANDOM  Final   Special Requests   Final    NONE Performed at [facility], [place] [address], [place], [place] [address]    Culture >=100,000 COLONIES/mL ESCHERICHIA COLI (A)  Final   Report Status 05/18/2018 FINAL  Final   Organism ID, Bacteria ESCHERICHIA COLI (A)  Final      Susceptibility   Escherichia coli - MIC*    AMPICILLIN <=2 SENSITIVE Sensitive     CEFAZOLIN <=4 SENSITIVE Sensitive     CEFTRIAXONE <=1 SENSITIVE Sensitive     CIPROFLOXACIN <=0.25 SENSITIVE Sensitive     GENTAMICIN <=1 SENSITIVE Sensitive     IMIPENEM <=0.25 SENSITIVE Sensitive     NITROFURANTOIN <=16 SENSITIVE Sensitive     TRIMETH/SULFA >=320 RESISTANT Resistant     AMPICILLIN/SULBACTAM <=2 SENSITIVE Sensitive     PIP/TAZO <=4 SENSITIVE Sensitive     Extended ESBL NEGATIVE Sensitive     * >=100,000 COLONIES/mL ESCHERICHIA COLI  Urine Culture     Status: Abnormal   Collection Time: 05/26/18  8:45 AM  Result Value Ref Range Status   Specimen Description URINE, RANDOM  Final   Special Requests NONE  Final   Culture (A)  Final    <10,000 COLONIES/mL  INSIGNIFICANT GROWTH Performed at [facility], [place] [address] [place], [place] [address]    Report Status 05/28/2018 FINAL  Final    Coagulation Studies: No results for input(s): LABPROT, INR in the last 72 hours.  Urinalysis: No results for input(s): COLORURINE, LABSPEC, PHURINE, GLUCOSEU, HGBUR, BILIRUBINUR, KETONESUR, PROTEINUR, UROBILINOGEN, NITRITE, LEUKOCYTESUR in the last 168 hours.  Invalid input(s): APPERANCEUR  Lipid Panel:     Component Value Date/Time   CHOL 154 04/01/2014 0814   TRIG 70 04/01/2014 0814   HDL 69 04/01/2014 0814   CHOLHDL 2.2 04/01/2014 0814   LDLCALC 71 04/01/2014 0814    HgbA1C: No results found for: HGBA1C  Urine Drug Screen:  No results found for: LABOPIA, COCAINSCRNUR, LABBENZ, AMPHETMU, THCU, LABBARB  Alcohol Level: No results for input(s): ETH in the last 168 hours.   Imaging: Ct Head Wo Contrast  Result Date: 07/15/2018 CLINICAL DATA:  Seizure.  Previous craniotomy for meningioma removal EXAM: CT HEAD WITHOUT CONTRAST TECHNIQUE: Contiguous axial images were obtained from the base of the skull through the vertex without intravenous contrast. COMPARISON:  Multiple prior CT examinations, most recent July 13, 2018 FINDINGS: Brain: The ventricles are diffusely enlarged. The ventricles are peer unchanged compared to 2 days prior. Over time, the lateral and third ventricles appear somewhat larger. The ventricles in proportion are larger than the sulci currently. There is no intracranial mass, hemorrhage, extra-axial fluid collection, or midline shift. There is encephalomalacia in the left frontal and temporal lobe regions. There is evidence of a prior small infarct in the medial right frontal lobe,  stable. No acute infarct is demonstrable. Vascular: No hyperdense vessel. There is calcification in the distal right vertebral artery and in the carotid siphon regions bilaterally. Skull: Patient has had a previous left frontal-parietal craniotomy.  Bony calvarium otherwise appears intact. Sinuses/Orbits: There is mucosal thickening in several ethmoid air cells. Other visualized paranasal sinuses are clear. Frontal sinuses are aplastic. Visualized orbits appear symmetric bilaterally. Other: Mastoid air cells are clear. IMPRESSION: Enlarged ventricles which have become larger over time. 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 There is also felt to be mild underlying atrophy. Encephalomalacia in the left frontal and left temporal lobe regions is again noted, stable compared to most recent study. Resolution of prior hemorrhage on the left compared to August 2019. Currently there is no mass or hemorrhage. No acute infarct evident. Foci of arterial vascular calcification noted. Mucosal thickening noted in several ethmoid air cells. Postoperative craniotomy defect involving portions of the left frontal and parietal bone superiorly. No new bone lesion. Electronically Signed   By: Lowella Grip III M.D.   On: 07/15/2018 13:25     Assessment/Plan: 77 year old male with a history of TBI and seizures who presents after having had a seizure this morning and not returning to baseline.  Patient currently on Vimpat at maximum dose.  Has recently had his Depakote discontinued due to concern for medication interactions.  Head CT performed and reviewed.  It shows sequelae of previous TBI but no acute changes.  MRI unable to be performed due to defibrillator.    Recommendations: 1.  Restart Depakote at 500mg  BID.  Discussed with pharmacy and there should be no significant interactions with Eliquis.   2.  Continue Vimpat at 200mg  BID 3.  Will give both anticonvulsants IV at this time.  Will change to po in the morning if patient more cooperative at that time.    4.  Seizure precautions 5.  EEG.   6.  Depakote level in the AM  Alexis Goodell, MD Neurology [phone] 07/15/2018, 3:51 PM

## 2018-07-15 NOTE — ED Notes (Addendum)
Family at bedside. Awaiting Md and plan of care. No sz activity noted. Patient continues to be postictal. Safety maintained.

## 2018-07-15 NOTE — ED Notes (Addendum)
As per ems patient at home with family, sitting in a chair, began to have grand mall sz activity lasting 2 mins. Asper ems patient usually does not have [place] sz with total body movement. Patient arrives to ed postictal, sating 98% on room air, able to follow command but not answer questions appropriately. sz precautions maintained. CM placed, ekg completed, labs drawn and  sent.  Will monitor.

## 2018-07-15 NOTE — ED Notes (Signed)
Patient postictal, incontinent of b/b. Peri care provided. Patient repositioned for safety. Family at bedside. Will continue to monitor.

## 2018-07-15 NOTE — ED Notes (Signed)
pcxr completed for admission status.

## 2018-07-15 NOTE — ED Provider Notes (Signed)
[facility] Emergency Department Provider Note   ____________________________________________   First MD Initiated Contact with Patient 07/15/18 1124     (approximate)  I have reviewed the triage vital signs and the nursing notes.   HISTORY  Chief Complaint Seizures Treat limited by postictal state and dementia most history obtained from old records and family  HPI [name] is a 77 y.o. male who has a very complex history.  He had a meningioma removed from his frontal lobe in 2014 did well with that he had a fall recently with an intracranial hemorrhage about 2 months ago.  Since then patient is had several seizures where he got stiff and could not move the longest was for about an hour.  Today he had a generalized tonic-clonic seizure.  He still not back to his baseline after the most recent seizure 2 hours ago at about 10:00.  He was recently started on Depakote for the seizures but his primary care doctor took him off as he seemed to be very weak and falling on it.  Still taking Vimpat.   Past Medical History:  Diagnosis Date  . A-fib ([place])   . AICD (automatic cardioverter/defibrillator) present 05/21/2016  . Arthritis    "back of my neck extending to back of head" (05/21/2016)  . Barrett's esophagus    Gets periodic endoscopies & biopsies. Coumadin has been held in the past for these precedures.   . Basal cell carcinoma    "scalp; chest; back"  . Blurred vision   . CAD (coronary artery disease)    S/P stenting of his LAD, circumflex & marginal branch as well as his RCA in 2008.  . Cardiomyopathy ([place])    Significant ischemic cardiomyopathy. ECHO 08/27/12: EF = 45-50% (Previous Echo revealed EF = 25%)  . Carotid artery occlusion    Carotid Doppler 08/27/12 SUMMARY - Bilateral ICAs: Demonstrated normal patency without evidence of a significant diamenter reduction, tortuosity or any other vascular abnormality. This was a Normal carotid duplex  Doppler Evaluation.  . Cerebral atherosclerosis    Carotid Doppler 08/27/12 SUMMARY - Bilateral ICAs: Demonstrated normal patency without evidence of a significant diamenter reduction, tortuosity or any other vascular abnormality. This was a Normal carotid duplex Doppler Evaluation.  . CHF (congestive heart failure) ([place])   . Chronic anticoagulation    For PE & PAF  . Claudication ([place])    right ABI of 0.84  . Coronary artery disease    Most recent cath by Dr. Gwenlyn Found 07/02/09 was remarkable for patent stents w/an 80% in-stent stenosis within the obtuse marginal branch stent, as well as 60% ostial RCA stenosis which was stented with a Taxus Liberte drug-eluting stent. Nuc stress test 09/03/12 was low risk & showed an inferolateral scar. & an EF of 39%  . Daily headache    "from the arthritis in back of neck" (05/21/2016)  . DVT (deep venous thrombosis) ([place]) 09/2010   "after knee replacement"  . GERD (gastroesophageal reflux disease)   . H/O mitral valve disorder   . History of hiatal hernia   . History of kidney stones   . History of stomach ulcers   . Hyperlipidemia   . Hypertension   . Meningioma ([place]) 08/15/12   BrainLAB-guided left frontoparietal craniotomy w/removal of a meningioma.   . Obstructive sleep apnea    Sleep Study in 2011 AHI 3.2. "occasionally wear my mask" (05/21/2016)  . Paroxysmal atrial fibrillation (HCC)   . PSVT (paroxysmal supraventricular tachycardia) (  [place])    2D ECHO 08/27/12: EF = 45-50% (Previous Echo revealed EF = 25%)  . Pulmonary embolism ([place]) 09/2010   "after knee replacement"  . Shortness of breath dyspnea   . Sick sinus syndrome ([place])   . TIA (transient ischemic attack)     Patient Active Problem List   Diagnosis Date Noted  . Seizures ([place])   . Acute blood loss anemia   . Chronic congestive heart failure ([place])   . TBI (traumatic brain injury) ([place]) 05/15/2018  . ICD (implantable cardioverter-defibrillator) in place 05/21/2016  . 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 Claudication of right lower extremity ([place]) 10/04/2015  . Preop cardiovascular exam 11/16/2014  . Palpitations 08/13/2013  . Chronic atrial fibrillation ([place]) 03/26/2013  . Hyperlipidemia 03/26/2013  . Barrett's esophagus 03/26/2013  . Obstructive sleep apnea 03/26/2013  . Cardiomyopathy, ischemic 12/19/2012  . HTN (hypertension) 12/19/2012  . Hx pulmonary embolism 12/19/2012  . NSVT (nonsustained ventricular tachycardia) ([place]) 10/23/2012  . CAD S/P multiple PCIs 10/23/2012  . PSVT (paroxysmal supraventricular tachycardia) ([place]) 09/30/2012  . Long term (current) use of anticoagulants 09/30/2012    Past Surgical History:  Procedure Laterality Date  . BASAL CELL CARCINOMA EXCISION     "head X 2; back, chest" (05/21/2016)  . CARDIAC CATHETERIZATION     Past caths in 2008, 2006, 2005 & 2003  . CATARACT EXTRACTION W/ INTRAOCULAR LENS  IMPLANT, BILATERAL Bilateral 2005  . CORONARY ANGIOPLASTY WITH STENT PLACEMENT  07/02/09   Most recent cath by Dr. Gwenlyn Found 07/02/09 was remarkable for patent stents w/an 80% in-stent stenosis within the obtuse marginal branch stent, as well as 60% ostial RCA stenosis which was stented with a Taxus Liberte drug-eluting stent.   . CORONARY ANGIOPLASTY WITH STENT PLACEMENT     "I have 12 stents in my heart" (05/21/2016)  . CRANIOTOMY  08/15/2012   At [facility]. BrainLAB-guided left frontoparietal craniotomy w/removal of a meningioma.   . EP IMPLANTABLE DEVICE N/A 05/21/2016   Procedure: ICD Implant;  Surgeon: Evans Lance, MD;  Location: [facility];  Service: Cardiovascular;  Laterality: N/A;  . INGUINAL HERNIA REPAIR Right 11/23/2014   Procedure: HERNIA REPAIR INGUINAL ADULT;  Surgeon: Rochel Brome, MD;  Location: [facility] OR[facility];  Service: General;  Laterality: Right;  . INGUINAL HERNIA REPAIR Bilateral 2010-2016  . JOINT REPLACEMENT    . THROAT SURGERY     "laser for Barret's esophagus"  . TONSILLECTOMY    . TOTAL KNEE  ARTHROPLASTY Left 09/2010   Surgery was complicated by PAF with a pulmonary embolism documented by CT angiogram.    Prior to Admission medications   Medication Sig Start Date End Date Taking? Authorizing Provider  acetaminophen (TYLENOL) 500 MG tablet Take 500 mg by mouth every 6 (six) hours as needed (for pain.).     [provider]  apixaban (ELIQUIS) 5 MG TABS tablet Take 1 tablet (5 mg total) by mouth 2 (two) times daily. 06/11/18   Love, Ivan Anchors, PA-C  aspirin EC 81 MG tablet Take 81 mg by mouth daily.    [provider]  docusate sodium (COLACE) 100 MG capsule Take 1 capsule (100 mg total) by mouth daily. 06/11/18   Love, Ivan Anchors, PA-C  finasteride (PROSCAR) 5 MG tablet Take 1 tablet (5 mg total) by mouth at bedtime. 06/11/18   Love, Ivan Anchors, PA-C  lacosamide (VIMPAT) 200 MG TABS tablet Take 1 tablet (200 mg total) by mouth 2 (two) times daily. 06/11/18  Love, Ivan Anchors, PA-C  Melatonin 3 MG TABS Take 2 tablets (6 mg total) by mouth at bedtime. 06/11/18   Love, Ivan Anchors, PA-C  metoprolol tartrate (LOPRESSOR) 25 MG tablet Take 50 mg by mouth 2 (two) times daily.     [provider]  Multiple Vitamins-Minerals (MULTIVITAMIN ADULT) TABS Take 1 tablet by mouth daily. 04/03/18   [provider]  pantoprazole (PROTONIX) 40 MG tablet Take 1 tablet (40 mg total) by mouth daily. 06/11/18   Love, Ivan Anchors, PA-C  polyethylene glycol (MIRALAX / GLYCOLAX) packet Take 17 g by mouth daily. 06/11/18   Love, Ivan Anchors, PA-C  polyvinyl alcohol (LIQUIFILM TEARS) 1.4 % ophthalmic solution Place 1 drop into both eyes as needed for dry eyes.     [provider]  QUEtiapine (SEROQUEL) 25 MG tablet Take 0.5 tablets (12.5 mg total) by mouth 2 (two) times daily as needed (agitation). 06/11/18   Love, Ivan Anchors, PA-C  QUEtiapine (SEROQUEL) 25 MG tablet Take 0.5 tablets (12.5 mg total) by mouth 2 (two) times daily. At 4 pm and 10 pm as well as prn. 06/11/18   Love, Ivan Anchors,  PA-C  tamsulosin (FLOMAX) 0.4 MG CAPS capsule Take 1 capsule (0.4 mg total) by mouth daily after supper. 06/11/18   Love, Ivan Anchors, PA-C  traZODone (DESYREL) 50 MG tablet Take 1 tablet (50 mg total) by mouth at bedtime. 06/11/18   Love, Ivan Anchors, PA-C  valproic acid (DEPAKENE) 250 MG capsule Take 1 capsule (250 mg total) by mouth 2 (two) times daily for 26 days. 07/04/18 07/30/18  Langston Masker B, PA-C  vitamin B-12 (CYANOCOBALAMIN) 1000 MCG tablet Take 1,000 mcg by mouth daily.    [provider]    Allergies Patient has no known allergies.  Family History  Problem Relation Age of Onset  . Cancer Mother   . Cardiomyopathy Sister   . Cancer Other        Breast    Social History Social History   Tobacco Use  . Smoking status: Former Smoker    Packs/day: 1.00    Years: 35.00    Pack years: 35.00    Types: Cigarettes    Last attempt to quit: 1980    Years since quitting: 40.0  . Smokeless tobacco: Former Systems developer    Quit date: 1989  Substance Use Topics  . Alcohol use: Yes    Alcohol/week: 28.0 standard drinks    Types: 28 Cans of beer per week    Comment: 4 beers per day  . Drug use: No    Review of Systems  Unavailable due to postictal state Neurological: Negative for headaches, focal weakness    ____________________________________________   PHYSICAL EXAM:  VITAL SIGNS: ED Triage Vitals  Enc Vitals Group     BP 07/15/18 1114 (!) 170/98     Pulse Rate 07/15/18 1114 82     Resp 07/15/18 1130 (!) 26     Temp 07/15/18 1114 97.7 F (36.5 C)     Temp Source 07/15/18 1114 Oral     SpO2 07/15/18 1114 96 %     Weight 07/15/18 1116 160 lb 15 oz (73 kg)     Height 07/15/18 1116 (!) 6" (0.152 m)     Head Circumference --      Peak Flow --      Pain Score 07/15/18 1116 0     Pain Loc --      Pain Edu? --  Excl. in [place]? --     Constitutional: Awake but confused.  Not following commands. Eyes: Conjunctivae are normal. PER. EOMI. Head:  Atraumatic. Nose: No congestion/rhinnorhea. Mouth/Throat: Mucous membranes are moist.  Oropharynx non-erythematous. Neck: No stridor.  Cardiovascular: Normal rate, regular rhythm. Grossly normal heart sounds.  Good peripheral circulation. Respiratory: Normal respiratory effort.  No retractions. Lungs CTAB. Gastrointestinal: Soft and nontender. No distention. No abdominal bruits.  Musculoskeletal: No lower extremity tenderness nor edema.   Neurologic: Patient moving all extremities legs very slightly Skin:  Skin is warm, dry and intact. No rash noted.   ____________________________________________   LABS (all labs ordered are listed, but only abnormal results are displayed)  Labs Reviewed  COMPREHENSIVE METABOLIC PANEL - Abnormal; Notable for the following components:      Result Value   Glucose, Bld 130 (*)    Creatinine, Ser 0.57 (*)    All other components within normal limits  CBC - Abnormal; Notable for the following components:   RBC 3.78 (*)    Hemoglobin 12.1 (*)    HCT 37.2 (*)    All other components within normal limits  VALPROIC ACID LEVEL  CBG MONITORING, ED   ____________________________________________  EKG   ____________________________________________  RADIOLOGY  ED MD interpretation: CT the head read as 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  Official radiology report(s): No results found.  ____________________________________________   PROCEDURES  Procedure(s) performed:  Procedures  Critical Care performed:  ____________________________________________   INITIAL IMPRESSION / ASSESSMENT AND PLAN / ED COURSE  Patient still with altered mental status almost 4 hours after his seizure.  We will plan on getting him in the hospital until he clears.  Neurology can consult on him hopefully tomorrow.         ____________________________________________   FINAL CLINICAL IMPRESSION(S) / ED DIAGNOSES  Final diagnoses:  None      ED Discharge Orders    None       Note:  This document was prepared using Dragon voice recognition software and may include unintentional dictation errors.    Nena Polio, MD 07/15/18 [phone]

## 2018-07-15 NOTE — ED Notes (Signed)
Seizure  precautions maintained. Awaiting nuero consult. Ct head negative for stroke.

## 2018-07-15 NOTE — ED Notes (Signed)
Patient continues to be postictal. Safety maintained. Vss.

## 2018-07-15 NOTE — ED Triage Notes (Signed)
Patient from home. Presents with Hx of sz, having  [place] sz at home lasting 75mins.  Patient postictal upon ed arrival. Family in waiting room

## 2018-07-15 NOTE — Procedures (Signed)
ELECTROENCEPHALOGRAM REPORT   Patient: [name]       Room #: 126A-AA EEG No. ID: [id number] Age: 77 y.o.        Sex: male Referring Physician: Leslye Peer Report Date:  07/15/2018        Interpreting Physician: Alexis Goodell  History: [name] is an 77 y.o. male with a history of TBI and seizures presenting with seizure and AMS  Medications:  Flomax, B12, Desyrel, Flomax, Seroquel, Protonix, Lopressor, MVI, Proscar, Eliquis, ASA, Melatonin  Conditions of Recording:  This is a 21 channel routine scalp EEG performed with bipolar and monopolar montages arranged in accordance to the international 10/20 system of electrode placement. One channel was dedicated to EKG recording.  The patient is in the awake state.  Description:  Artifact is prominent during the recording, particularly over the left hemisphere, often obscuring the background rhythm. When able to be visualized the background is slow and poorly organized.   It consists of a low voltage, mixture of polymorphic delta and theta activity.  This activiy is continuous.  No distinct epileptiform activity is noted.   Hyperventilation was not performed.  Intermittent photic stimulation was performed but failed to illicit any change in the tracing.   IMPRESSION: This is an abnormal EEG secondary to general background slowing.  This finding may be seen with a diffuse disturbance that is etiologically nonspecific, but may include a metabolic encephalopathy, among other possibilities.  No epileptiform activity was noted.     Alexis Goodell, MD Neurology [phone] 07/15/2018, 6:09 PM

## 2018-07-15 NOTE — ED Notes (Addendum)
Neurology  at bedside to consult patient.. Patient continues to be postictal. Follows commands when directed to.

## 2018-07-15 NOTE — ED Notes (Signed)
Report to [facility]is receiving nurse.

## 2018-07-15 NOTE — Progress Notes (Signed)
Patient ID: [name], male   DOB: 1941-03-20, 77 y.o.   MRN: [id number]  ACP note  Patient present but unable to participate in this conversation.  Daughter and son at the bedside.  Diagnosis: Seizure, traumatic brain injury and aphasia, atrial fibrillation, accelerated hypertension, history of hallucinations and agitation, BPH, history of CAD and cardiomyopathy  CODE STATUS discussed and they wish him to be a full code  Plan.  Case discussed with neurology and Dr. Doy Mince will order seizure medications.  Patient on Vimpat at home and recently taken off Depakote she will likely add another medication.  EEG ordered.  We will get physical therapy evaluation.  Check stool studies urine analysis and chest x-ray.  Time spent on ACP discussion 20 minutes Dr. Loletha Grayer

## 2018-07-15 NOTE — H&P (Signed)
[place] [facility] [place] NAME: [name]    MR#:  [id number]  DATE OF BIRTH:  18-Feb-1941  DATE OF ADMISSION:  07/15/2018  PRIMARY CARE PHYSICIAN: Dr. Frazier Richards  REQUESTING/REFERRING PHYSICIAN: Dr Conni Slipper  CHIEF COMPLAINT:   Chief Complaint  Patient presents with  . Seizures    HISTORY OF PRESENT ILLNESS:  [name]  is a 77 y.o. male with a known history of traumatic brain injury, aphasia and seizures.  Presents after a grand mal seizure today.  Patient unable to give much history.  As per family on August 24 he had a fall off a ladder and hit his head and had a traumatic brain injury requiring craniotomy.  He was at [facility] for long period time then [place] [facility] then [facility] then: Rehab in [facility] and now home.  He had a seizure on Friday.  He has had trouble with his right leg and mobility.  He now has 24/7 care in the home.  He used to be able to walk with a walker but not able to do that anymore.  Patient has had 3 bowel movements in the emergency room.  PAST MEDICAL HISTORY:   Past Medical History:  Diagnosis Date  . A-fib ([place])   . AICD (automatic cardioverter/defibrillator) present 05/21/2016  . Arthritis    "back of my neck extending to back of head" (05/21/2016)  . Barrett's esophagus    Gets periodic endoscopies & biopsies. Coumadin has been held in the past for these precedures.   . Basal cell carcinoma    "scalp; chest; back"  . Blurred vision   . CAD (coronary artery disease)    S/P stenting of his LAD, circumflex & marginal branch as well as his RCA in 2008.  . Cardiomyopathy ([place])    Significant ischemic cardiomyopathy. ECHO 08/27/12: EF = 45-50% (Previous Echo revealed EF = 25%)  . Carotid artery occlusion    Carotid Doppler 08/27/12 SUMMARY - Bilateral ICAs: Demonstrated normal patency without evidence of a significant diamenter reduction, tortuosity or any other vascular abnormality.  This was a Normal carotid duplex Doppler Evaluation.  . Cerebral atherosclerosis    Carotid Doppler 08/27/12 SUMMARY - Bilateral ICAs: Demonstrated normal patency without evidence of a significant diamenter reduction, tortuosity or any other vascular abnormality. This was a Normal carotid duplex Doppler Evaluation.  . CHF (congestive heart failure) ([place])   . Chronic anticoagulation    For PE & PAF  . Claudication ([place])    right ABI of 0.84  . Coronary artery disease    Most recent cath by Dr. Gwenlyn Found 07/02/09 was remarkable for patent stents w/an 80% in-stent stenosis within the obtuse marginal branch stent, as well as 60% ostial RCA stenosis which was stented with a Taxus Liberte drug-eluting stent. Nuc stress test 09/03/12 was low risk & showed an inferolateral scar. & an EF of 39%  . Daily headache    "from the arthritis in back of neck" (05/21/2016)  . DVT (deep venous thrombosis) ([place]) 09/2010   "after knee replacement"  . GERD (gastroesophageal reflux disease)   . H/O mitral valve disorder   . History of hiatal hernia   . History of kidney stones   . History of stomach ulcers   . Hyperlipidemia   . Hypertension   . Meningioma ([place]) 08/15/12   BrainLAB-guided left frontoparietal craniotomy w/removal of a meningioma.   . Obstructive sleep apnea    Sleep Study in  2011 AHI 3.2. "occasionally wear my mask" (05/21/2016)  . Paroxysmal atrial fibrillation (H[facility])   . PSVT (paroxysmal supraventricular tachycardia) ([place])    2D ECHO 08/27/12: EF = 45-50% (Previous Echo revealed EF = 25%)  . Pulmonary embolism ([place]) 09/2010   "after knee replacement"  . Shortness of breath dyspnea   . Sick sinus syndrome ([place])   . TIA (transient ischemic attack)     PAST SURGICAL HISTORY:   Past Surgical History:  Procedure Laterality Date  . BASAL CELL CARCINOMA EXCISION     "head X 2; back, chest" (05/21/2016)  . CARDIAC CATHETERIZATION     Past caths in 2008, 2006, 2005 & 2003  . CATARACT EXTRACTION W/  INTRAOCULAR LENS  IMPLANT, BILATERAL Bilateral 2005  . CORONARY ANGIOPLASTY WITH STENT PLACEMENT  07/02/09   Most recent cath by Dr. Gwenlyn Found 07/02/09 was remarkable for patent stents w/an 80% in-stent stenosis within the obtuse marginal branch stent, as well as 60% ostial RCA stenosis which was stented with a Taxus Liberte drug-eluting stent.   . CORONARY ANGIOPLASTY WITH STENT PLACEMENT     "I have 12 stents in my heart" (05/21/2016)  . CRANIOTOMY  08/15/2012   At [facility]. BrainLAB-guided left frontoparietal craniotomy w/removal of a meningioma.   . EP IMPLANTABLE DEVICE N/A 05/21/2016   Procedure: ICD Implant;  Surgeon: Evans Lance, MD;  Location: [facility];  Service: Cardiovascular;  Laterality: N/A;  . INGUINAL HERNIA REPAIR Right 11/23/2014   Procedure: HERNIA REPAIR INGUINAL ADULT;  Surgeon: Rochel Brome, MD;  Location: [facility] ORS;  Service: General;  Laterality: Right;  . INGUINAL HERNIA REPAIR Bilateral 2010-2016  . JOINT REPLACEMENT    . THROAT SURGERY     "laser for Barret's esophagus"  . TONSILLECTOMY    . TOTAL KNEE ARTHROPLASTY Left 09/2010   Surgery was complicated by PAF with a pulmonary embolism documented by CT angiogram.    SOCIAL HISTORY:   Social History   Tobacco Use  . Smoking status: Former Smoker    Packs/day: 1.00    Years: 35.00    Pack years: 35.00    Types: Cigarettes    Last attempt to quit: 1980    Years since quitting: 40.0  . Smokeless tobacco: Former Systems developer    Quit date: 1989  Substance Use Topics  . Alcohol use: Yes    Alcohol/week: 28.0 standard drinks    Types: 28 Cans of beer per week    Comment: 4 beers per day    FAMILY HISTORY:   Family History  Problem Relation Age of Onset  . Cancer Mother   . Cardiomyopathy Sister   . Cancer Other        Breast  . Suicidality Father   . Anxiety disorder Father   . Schizophrenia Brother     DRUG ALLERGIES:  No Known Allergies  REVIEW OF SYSTEMS:  Unable to provide review of systems  secondary to aphasia  MEDICATIONS AT HOME:   Prior to Admission medications   Medication Sig Start Date End Date Taking? Authorizing Provider  acetaminophen (TYLENOL) 500 MG tablet Take 500 mg by mouth every 6 (six) hours as needed (for pain.).     [provider]  apixaban (ELIQUIS) 5 MG TABS tablet Take 1 tablet (5 mg total) by mouth 2 (two) times daily. 06/11/18   Love, Ivan Anchors, PA-C  aspirin EC 81 MG tablet Take 81 mg by mouth daily.    [provider]  docusate sodium (COLACE)  100 MG capsule Take 1 capsule (100 mg total) by mouth daily. 06/11/18   Love, Ivan Anchors, PA-C  finasteride (PROSCAR) 5 MG tablet Take 1 tablet (5 mg total) by mouth at bedtime. 06/11/18   Love, Ivan Anchors, PA-C  lacosamide (VIMPAT) 200 MG TABS tablet Take 1 tablet (200 mg total) by mouth 2 (two) times daily. 06/11/18   Love, Ivan Anchors, PA-C  Melatonin 3 MG TABS Take 2 tablets (6 mg total) by mouth at bedtime. 06/11/18   Love, Ivan Anchors, PA-C  metoprolol tartrate (LOPRESSOR) 25 MG tablet Take 50 mg by mouth 2 (two) times daily.     [provider]  Multiple Vitamins-Minerals (MULTIVITAMIN ADULT) TABS Take 1 tablet by mouth daily. 04/03/18   [provider]  pantoprazole (PROTONIX) 40 MG tablet Take 1 tablet (40 mg total) by mouth daily. 06/11/18   Love, Ivan Anchors, PA-C  polyethylene glycol (MIRALAX / GLYCOLAX) packet Take 17 g by mouth daily. 06/11/18   Love, Ivan Anchors, PA-C  polyvinyl alcohol (LIQUIFILM TEARS) 1.4 % ophthalmic solution Place 1 drop into both eyes as needed for dry eyes.     [provider]  QUEtiapine (SEROQUEL) 25 MG tablet Take 0.5 tablets (12.5 mg total) by mouth 2 (two) times daily as needed (agitation). 06/11/18   Love, Ivan Anchors, PA-C  QUEtiapine (SEROQUEL) 25 MG tablet Take 0.5 tablets (12.5 mg total) by mouth 2 (two) times daily. At 4 pm and 10 pm as well as prn. 06/11/18   Love, Ivan Anchors, PA-C  tamsulosin (FLOMAX) 0.4 MG CAPS capsule Take 1 capsule (0.4 mg  total) by mouth daily after supper. 06/11/18   Love, Ivan Anchors, PA-C  traZODone (DESYREL) 50 MG tablet Take 1 tablet (50 mg total) by mouth at bedtime. 06/11/18   Love, Ivan Anchors, PA-C  valproic acid (DEPAKENE) 250 MG capsule Take 1 capsule (250 mg total) by mouth 2 (two) times daily for 26 days. 07/04/18 07/30/18  Langston Masker B, PA-C  vitamin B-12 (CYANOCOBALAMIN) 1000 MCG tablet Take 1,000 mcg by mouth daily.    [provider]   Medication reconciliation still undergoing  VITAL SIGNS:  Blood pressure (!) 143/126, pulse 86, temperature 97.7 F (36.5 C), temperature source Oral, resp. rate (!) 24, height (!) 6" (0.152 m), weight 73 kg, SpO2 93 %.  PHYSICAL EXAMINATION:  GENERAL:  77 y.o.-year-old patient lying in the bed with no acute distress.  EYES: Pupils equal, round, reactive to light and accommodation. No scleral icterus. Extraocular muscles intact.  HEENT: Head atraumatic, normocephalic. Oropharynx and nasopharynx clear.  NECK:  Supple, no jugular venous distention. No thyroid enlargement, no tenderness.  LUNGS: Normal breath sounds bilaterally, no wheezing, rales,rhonchi or crepitation. No use of accessory muscles of respiration.  CARDIOVASCULAR: S1, S2 normal. No murmurs, rubs, or gallops.  ABDOMEN: Soft, nontender, nondistended. Bowel sounds present. No organomegaly or mass.  EXTREMITIES: No pedal edema, cyanosis, or clubbing.  NEUROLOGIC: Patient able to move his legs.  Right leg is weaker than the left and unable to lift his right leg up on his own.  He is able to hold it up there when I release it.  Babinski negative.  Able to lift up arms and hold them up.  Difficult to follow some of the commands that I was giving him. PSYCHIATRIC: The patient is alert with some agitation.  SKIN: No rash, lesion, or ulcer.   LABORATORY PANEL:   CBC Recent Labs  Lab 07/15/18 1127  WBC 5.0  HGB 12.1*  HCT 37.2*  PLT 155    ------------------------------------------------------------------------------------------------------------------  Chemistries  Recent Labs  Lab 07/15/18 1127  NA 139  K 3.7  CL 105  CO2 25  GLUCOSE 130*  BUN 14  CREATININE 0.57*  CALCIUM 8.9  AST 26  ALT 14  ALKPHOS 65  BILITOT 0.7   ------------------------------------------------------------------------------------------------------------------  RADIOLOGY:  Ct Head Wo Contrast  Result Date: 07/15/2018 CLINICAL DATA:  Seizure.  Previous craniotomy for meningioma removal EXAM: CT HEAD WITHOUT CONTRAST TECHNIQUE: Contiguous axial images were obtained from the base of the skull through the vertex without intravenous contrast. COMPARISON:  Multiple prior CT examinations, most recent July 13, 2018 FINDINGS: Brain: The ventricles are diffusely enlarged. The ventricles are peer unchanged compared to 2 days prior. Over time, the lateral and third ventricles appear somewhat larger. The ventricles in proportion are larger than the sulci currently. There is no intracranial mass, hemorrhage, extra-axial fluid collection, or midline shift. There is encephalomalacia in the left frontal and temporal lobe regions. There is evidence of a prior small infarct in the medial right frontal lobe, stable. No acute infarct is demonstrable. Vascular: No hyperdense vessel. There is calcification in the distal right vertebral artery and in the carotid siphon regions bilaterally. Skull: Patient has had a previous left frontal-parietal craniotomy. Bony calvarium otherwise appears intact. Sinuses/Orbits: There is mucosal thickening in several ethmoid air cells. Other visualized paranasal sinuses are clear. Frontal sinuses are aplastic. Visualized orbits appear symmetric bilaterally. Other: Mastoid air cells are clear. IMPRESSION: Enlarged ventricles which have become larger over time. 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 There is also felt to be  mild underlying atrophy. Encephalomalacia in the left frontal and left temporal lobe regions is again noted, stable compared to most recent study. Resolution of prior hemorrhage on the left compared to August 2019. Currently there is no mass or hemorrhage. No acute infarct evident. Foci of arterial vascular calcification noted. Mucosal thickening noted in several ethmoid air cells. Postoperative craniotomy defect involving portions of the left frontal and parietal bone superiorly. No new bone lesion. Electronically Signed   By: Lowella Grip III M.D.   On: 07/15/2018 13:25    EKG:   Previous EKG was atrial fibrillation  IMPRESSION AND PLAN:   1.  Seizure with seizure disorder.  Case discussed with neurology and she will make adjustments in medications.  EEG ordered.  Patient on Vimpat at home.  Recently taken off Depakote. 2.  Traumatic brain injury and aphasia.  Family wants him to be a full code.  I told them that he will not get back to the way he was prior to the accident on August 24. 3.  Atrial fibrillation on Eliquis and metoprolol. 4.  Accelerated hypertension likely secondary to agitation. 5.  History of hallucinations and agitation will give Seroquel. 6.  BPH on Proscar 7.  3 bowel movements in the ER we will send off stool studies.  Hold anticonstipation medication 8.  History of CAD and cardiomyopathy.  Status post defibrillator placement.  We will give 1 L of IV fluid.  Watch fluid status closely. 9.  Check a chest x-ray and urine analysis  All the records are reviewed and case discussed with ED provider. Management plans discussed with the patient, family and they are in agreement.  CODE STATUS: full code  TOTAL TIME TAKING CARE OF THIS PATIENT: 50 minutes, including ACP time   Loletha Grayer M.D on 07/15/2018 at 3:35 PM  Between 7am to 6pm -  Pager - [phone]  After 6pm call admission pager [phone]  [facility]  [phone]  CC: Primary care  physician; Dr. Frazier Richards

## 2018-07-16 ENCOUNTER — Inpatient Hospital Stay: Payer: Medicare Other

## 2018-07-16 DIAGNOSIS — E785 Hyperlipidemia, unspecified: Secondary | ICD-10-CM | POA: Diagnosis not present

## 2018-07-16 DIAGNOSIS — K2270 Barrett's esophagus without dysplasia: Secondary | ICD-10-CM | POA: Diagnosis not present

## 2018-07-16 DIAGNOSIS — I110 Hypertensive heart disease with heart failure: Secondary | ICD-10-CM | POA: Diagnosis not present

## 2018-07-16 DIAGNOSIS — J439 Emphysema, unspecified: Secondary | ICD-10-CM | POA: Diagnosis not present

## 2018-07-16 DIAGNOSIS — G40409 Other generalized epilepsy and epileptic syndromes, not intractable, without status epilepticus: Secondary | ICD-10-CM | POA: Diagnosis not present

## 2018-07-16 DIAGNOSIS — K219 Gastro-esophageal reflux disease without esophagitis: Secondary | ICD-10-CM | POA: Diagnosis not present

## 2018-07-16 DIAGNOSIS — N39498 Other specified urinary incontinence: Secondary | ICD-10-CM | POA: Diagnosis not present

## 2018-07-16 DIAGNOSIS — I495 Sick sinus syndrome: Secondary | ICD-10-CM | POA: Diagnosis not present

## 2018-07-16 DIAGNOSIS — I255 Ischemic cardiomyopathy: Secondary | ICD-10-CM | POA: Diagnosis not present

## 2018-07-16 DIAGNOSIS — N401 Enlarged prostate with lower urinary tract symptoms: Secondary | ICD-10-CM | POA: Diagnosis not present

## 2018-07-16 DIAGNOSIS — R443 Hallucinations, unspecified: Secondary | ICD-10-CM | POA: Diagnosis not present

## 2018-07-16 DIAGNOSIS — I5022 Chronic systolic (congestive) heart failure: Secondary | ICD-10-CM | POA: Diagnosis not present

## 2018-07-16 DIAGNOSIS — R4701 Aphasia: Secondary | ICD-10-CM | POA: Diagnosis not present

## 2018-07-16 DIAGNOSIS — I480 Paroxysmal atrial fibrillation: Secondary | ICD-10-CM | POA: Diagnosis not present

## 2018-07-16 DIAGNOSIS — M4692 Unspecified inflammatory spondylopathy, cervical region: Secondary | ICD-10-CM | POA: Diagnosis not present

## 2018-07-16 DIAGNOSIS — G4733 Obstructive sleep apnea (adult) (pediatric): Secondary | ICD-10-CM | POA: Diagnosis not present

## 2018-07-16 DIAGNOSIS — R569 Unspecified convulsions: Secondary | ICD-10-CM | POA: Diagnosis present

## 2018-07-16 DIAGNOSIS — F0390 Unspecified dementia without behavioral disturbance: Secondary | ICD-10-CM | POA: Diagnosis present

## 2018-07-16 DIAGNOSIS — I2510 Atherosclerotic heart disease of native coronary artery without angina pectoris: Secondary | ICD-10-CM | POA: Diagnosis not present

## 2018-07-16 LAB — BASIC METABOLIC PANEL
ANION GAP: 10 (ref 5–15)
BUN: 12 mg/dL (ref 8–23)
CO2: 24 mmol/L (ref 22–32)
Calcium: 8.6 mg/dL — ABNORMAL LOW (ref 8.9–10.3)
Chloride: 103 mmol/L (ref 98–111)
Creatinine, Ser: 0.58 mg/dL — ABNORMAL LOW (ref 0.61–1.24)
GFR calc Af Amer: >60 mL/min (ref >60)
GFR calc non Af Amer: >60 mL/min (ref >60)
Glucose, Bld: 96 mg/dL (ref 70–99)
Potassium: 3.4 mmol/L — ABNORMAL LOW (ref 3.5–5.1)
Sodium: 137 mmol/L (ref 135–145)

## 2018-07-16 LAB — CBC
HCT: 34.3 % — ABNORMAL LOW (ref 39.0–52.0)
Hemoglobin: 11.5 g/dL — ABNORMAL LOW (ref 13.0–17.0)
MCH: 32 pg (ref 26.0–34.0)
MCHC: 33.5 g/dL (ref 30.0–36.0)
MCV: 95.5 fL (ref 80.0–100.0)
PLATELETS: 163 10*3/uL (ref 150–400)
RBC: 3.59 MIL/uL — ABNORMAL LOW (ref 4.22–5.81)
RDW: 14.6 % (ref 11.5–15.5)
WBC: 5.3 10*3/uL (ref 4.0–10.5)
nRBC: 0 % (ref 0.0–0.2)

## 2018-07-16 LAB — VALPROIC ACID LEVEL: Valproic Acid Lvl: <10 ug/mL — ABNORMAL LOW (ref 50.0–100.0)

## 2018-07-16 MED ORDER — VALPROIC ACID 250 MG PO CAPS
250.0000 mg | ORAL_CAPSULE | Freq: Two times a day (BID) | ORAL | Status: DC
  Administered 2018-07-16: 250 mg via ORAL
  Filled 2018-07-16 (×2): qty 1

## 2018-07-16 MED ORDER — LACOSAMIDE 50 MG PO TABS
200.0000 mg | ORAL_TABLET | Freq: Two times a day (BID) | ORAL | Status: DC
  Administered 2018-07-16: 200 mg via ORAL
  Filled 2018-07-16: qty 4

## 2018-07-16 NOTE — Evaluation (Signed)
Physical Therapy Evaluation Patient Details Name: [name] MRN: [id number] DOB: 01-02-1941 Today's Date: 07/16/2018   History of Present Illness  Pt is a 78 y/o M s/p grand mal seizure.  He additionally has been demonstrating weakness RLE. Head CT shows no etiology. Pt unable to have MRI.  Lumbar CT: Acute or subacute L1 superior endplate fracture with minor height loss; Disc and facet degeneration most notable at L4-5 where there is bulky facet spurring and anterolisthesis. There is associated moderate spinal stenosis.  Pt with recent h/o TBI on Mar 08, 2018.   Pt's additional PMH includes a-fib, AICD, blurred vision, CHF, TIA, L TKA.      Clinical Impression  Pt admitted with above diagnosis. Pt currently with functional limitations due to the deficits listed below (see PT Problem List). [name] presents with impaired cognition at baseline due to recent TBI.  Pt with new RLE weakness (progressed over the past week or so per daughter).  Difficult to formally assess but functionally RLE fatigues with ambulation and pt has more difficulty performing SLR with RLE compared to LLE.  He demonstrates unsteadiness while ambulating, requiring up to mod assist.  He has most difficulty when turning and requires assist for positioning of RLE.  Discussed safety in the home with the daughter as described below.  Pt will benefit from skilled PT to increase their independence and safety with mobility to allow discharge to the venue listed below.      Follow Up Recommendations Home health PT;Supervision for mobility/OOB    Equipment Recommendations  None recommended by PT    Recommendations for Other Services       Precautions / Restrictions Precautions Precautions: Fall Restrictions Weight Bearing Restrictions: No      Mobility  Bed Mobility Overal bed mobility: Needs Assistance Bed Mobility: Supine to Sit;Sit to Supine     Supine to sit: Min assist;HOB elevated Sit to supine: Min guard    General bed mobility comments: Assist to scoot to EOB for supine>sit.  Min guard for safety with sit>supine.   Transfers Overall transfer level: Needs assistance Equipment used: Rolling walker (2 wheeled) Transfers: Sit to/from Stand Sit to Stand: Min assist         General transfer comment: Assist to remain steady and to boost to standing.   Ambulation/Gait Ambulation/Gait assistance: Mod assist Gait Distance (Feet): 80 Feet Assistive device: Rolling walker (2 wheeled) Gait Pattern/deviations: Step-to pattern;Decreased step length - right;Ataxic     General Gait Details: Pt demonstrates step to pattern bringing RLE after LLE when ambulating requiring intermittent min assist to remain steady and to guide RW in proper direction (pt pushes to the L). When pt returns to room and turns to his L he leaves his RLE behind and requires assist to physically move RLE to avoid tangling his feet placing him at increased risk of falling.    Stairs            Wheelchair Mobility    Modified Rankin (Stroke Patients Only)       Balance Overall balance assessment: Needs assistance;History of Falls Sitting-balance support: No upper extremity supported;Feet supported Sitting balance-Leahy Scale: Fair     Standing balance support: Bilateral upper extremity supported;During functional activity Standing balance-Leahy Scale: Poor Standing balance comment: Relies on UE support and outside physical assist                             Pertinent  Vitals/Pain Pain Assessment: Faces Faces Pain Scale: No hurt Pain Location: no signs of pain Pain Intervention(s): Monitored during session    [place] expects to be discharged to:: Private residence Living Arrangements: Alone Available Help at Discharge: Family;Available 24 hours/day Type of Home: House Home Access: Stairs to enter Entrance Stairs-Rails: Left;Right(ramp to be delivered next date per  daughter) Technical brewer of Steps: 4 Home Layout: One level(has basement that is blocked intentionally by family) Home Equipment: Walker - 4 wheels;Wheelchair - manual;Shower seat;Bedside commode;Grab bars - tub/shower;Other (comment)(gait belt x2)      Prior Function Level of Independence: Needs assistance   Gait / Transfers Assistance Needed: D/c from [facility] 12/17 and was able to ambulate with rollator and standby assist. Had an episode of unconsciousness on 12/20 and went to [facility] home the same day.  Gradually RLE more weak.  On 12/29 RLE weakness significant.  Came to [facility]  with imaging clear of R hip.  5-6 falls since leaving [facility].  Then seizure leading to this admission.  Over the past week pt has been using his WC and requires assist to push this.    ADL's / Homemaking Assistance Needed: Ind dressing prior to RLE weakness, now needs assist.  Pt requires assist to shower.  Ind with feeding.    Comments: Sitters 24/7 assist/supervision as well as strong family support.      Hand Dominance   Dominant Hand: Right    Extremity/Trunk Assessment   Upper Extremity Assessment Upper Extremity Assessment: (Strength functionally WNL BUEs)    Lower Extremity Assessment Lower Extremity Assessment: RLE deficits/detail RLE Deficits / Details: Difficult to formally assess due to cognition but functionally RLE demonstrates fatigue with ambulation and pt has more difficulty raising RLE from bed for SLR.        Communication   Communication: Expressive difficulties(replies with "yes" to most questions)  Cognition Arousal/Alertness: Awake/alert Behavior During Therapy: Impulsive;Flat affect Overall Cognitive Status: History of cognitive impairments - at baseline                                        General Comments General comments (skin integrity, edema, etc.): Discussed safety in the home with the daughter.  Pt will require assist with  OOB activity with use of gait belt.  If there is the option, when turning, turn to the R to avoid RLE left behind and crossing behind LLE.     Exercises     Assessment/Plan    PT Assessment Patient needs continued PT services  PT Problem List Decreased strength;Decreased balance;Decreased activity tolerance;Decreased coordination;Decreased cognition;Decreased knowledge of use of DME;Decreased safety awareness       PT Treatment Interventions DME instruction;Gait training;Stair training;Functional mobility training;Therapeutic activities;Therapeutic exercise;Balance training;Neuromuscular re-education;Cognitive remediation;Patient/family education;Wheelchair mobility training    PT Goals (Current goals can be found in the Care Plan section)  Acute Rehab PT Goals Patient Stated Goal: per daughter, to return home and initiate HHPT PT Goal Formulation: With family Time For Goal Achievement: 07/30/18 Potential to Achieve Goals: Fair    Frequency Min 2X/week   Barriers to discharge        Co-evaluation               AM-PAC PT "6 Clicks" Mobility  Outcome Measure Help needed turning from your back to your side while in a flat bed without using  bedrails?: A Little Help needed moving from lying on your back to sitting on the side of a flat bed without using bedrails?: A Little Help needed moving to and from a bed to a chair (including a wheelchair)?: A Little Help needed standing up from a chair using your arms (e.g., wheelchair or bedside chair)?: A Little Help needed to walk in hospital room?: A Little Help needed climbing 3-5 steps with a railing? : A Little 6 Click Score: 18    End of Session Equipment Utilized During Treatment: Gait belt Activity Tolerance: Patient limited by fatigue Patient left: in bed;with call bell/phone within reach;with bed alarm set;with family/visitor present;with nursing/sitter in room Nurse Communication: Mobility status PT Visit Diagnosis:  Unsteadiness on feet (R26.81);Other abnormalities of gait and mobility (R26.89);Ataxic gait (R26.0);History of falling (Z91.81);Muscle weakness (generalized) (M62.81);Repeated falls (R29.6);Difficulty in walking, not elsewhere classified (R26.2)    Time: 1833-5825 PT Time Calculation (min) (ACUTE ONLY): 31 min   Charges:   PT Evaluation $PT Eval Moderate Complexity: 1 Mod PT Treatments $Gait Training: 8-22 mins       Collie Siad PT, DPT 07/16/2018, 3:39 PM

## 2018-07-16 NOTE — Discharge Summary (Signed)
[facility] NAME: [name]    MR#:  [id number]  DATE OF BIRTH:  1940-08-12  DATE OF ADMISSION:  07/15/2018 ADMITTING PHYSICIAN: Loletha Grayer, MD  DATE OF DISCHARGE: 07/16/2018  PRIMARY CARE PHYSICIAN: Lenard Simmer, MD    ADMISSION DIAGNOSIS:  Seizure ([place]) [R56.9] Agitation [R45.1] Altered mental status, unspecified altered mental status type [R41.82]  DISCHARGE DIAGNOSIS:  Active Problems:   Seizure ([place])   SECONDARY DIAGNOSIS:   Past Medical History:  Diagnosis Date  . A-fib ([place])   . AICD (automatic cardioverter/defibrillator) present 05/21/2016  . Arthritis    "back of my neck extending to back of head" (05/21/2016)  . Barrett's esophagus    Gets periodic endoscopies & biopsies. Coumadin has been held in the past for these precedures.   . Basal cell carcinoma    "scalp; chest; back"  . Blurred vision   . CAD (coronary artery disease)    S/P stenting of his LAD, circumflex & marginal branch as well as his RCA in 2008.  . Cardiomyopathy ([place])    Significant ischemic cardiomyopathy. ECHO 08/27/12: EF = 45-50% (Previous Echo revealed EF = 25%)  . Carotid artery occlusion    Carotid Doppler 08/27/12 SUMMARY - Bilateral ICAs: Demonstrated normal patency without evidence of a significant diamenter reduction, tortuosity or any other vascular abnormality. This was a Normal carotid duplex Doppler Evaluation.  . Cerebral atherosclerosis    Carotid Doppler 08/27/12 SUMMARY - Bilateral ICAs: Demonstrated normal patency without evidence of a significant diamenter reduction, tortuosity or any other vascular abnormality. This was a Normal carotid duplex Doppler Evaluation.  . CHF (congestive heart failure) ([place])   . Chronic anticoagulation    For PE & PAF  . Claudication ([place])    right ABI of 0.84  . Coronary artery disease    Most recent cath by Dr. Gwenlyn Found 07/02/09 was remarkable for patent stents w/an 80% in-stent  stenosis within the obtuse marginal branch stent, as well as 60% ostial RCA stenosis which was stented with a Taxus Liberte drug-eluting stent. Nuc stress test 09/03/12 was low risk & showed an inferolateral scar. & an EF of 39%  . Daily headache    "from the arthritis in back of neck" (05/21/2016)  . DVT (deep venous thrombosis) ([place]) 09/2010   "after knee replacement"  . GERD (gastroesophageal reflux disease)   . H/O mitral valve disorder   . History of hiatal hernia   . History of kidney stones   . History of stomach ulcers   . Hyperlipidemia   . Hypertension   . Meningioma ([place]) 08/15/12   BrainLAB-guided left frontoparietal craniotomy w/removal of a meningioma.   . Obstructive sleep apnea    Sleep Study in 2011 AHI 3.2. "occasionally wear my mask" (05/21/2016)  . Paroxysmal atrial fibrillation ([facility])   . PSVT (paroxysmal supraventricular tachycardia) ([place])    2D ECHO 08/27/12: EF = 45-50% (Previous Echo revealed EF = 25%)  . Pulmonary embolism ([place]) 09/2010   "after knee replacement"  . Shortness of breath dyspnea   . Sick sinus syndrome ([place])   . TIA (transient ischemic attack)     HOSPITAL COURSE:   78 year old male with past medical history of traumatic brain injury, paroxysmal atrial fibrillation, previous history of TIA, status post AICD, hypertension, hyperlipidemia, obstructive sleep apnea, history of pulmonary embolism who presented to the hospital due to seizures.  1.  Seizures- patient presented to the hospital after a seizure and  postictal state.  [name] Depakote was recently held due to some drug drug interaction.  Patient presented to the ER and his Depakote was reintroduced and he was maintained on his Vimpat.  Patient was given these drugs intravenously.  A neurology consult was obtained.  Patient underwent a CT of the head which was negative for acute pathology.  [name] EEG showed no evidence of acute seizures. - After being restarted on his Depakote and Vimpat  patient has had no further seizure activity.  His mental status is back to baseline.  He has no postictal state.  He is being discharged home.  2. Right leg Weakness-patient developed this post seizure.  This has improved.  Patient underwent a CT of the lumbar spine which showed acute/subacute L1 endplate fracture/some mild spinal stenosis.  No CT scan findings suggestive to explain patient's right lower extremity weakness. -Neurology recommended outpatient neurology follow-up and further testing.  3.  Altered mental status-secondary to seizure/postictal state.  This has significantly improved overnight and is back to baseline.  4.  History of atrial fibrillation- rate controlled.  Patient will continue his metoprolol. -Continue Eliquis.  5.  History of BPH-no urinary retention.  Patient will continue finasteride, Flomax.  6.  GERD-continue Protonix.    DISCHARGE CONDITIONS:     CONSULTS OBTAINED:  Treatment Team:  Catarina Hartshorn, MD Alexis Goodell, MD  DRUG ALLERGIES:  No Known Allergies  DISCHARGE MEDICATIONS:   Allergies as of 07/16/2018   No Known Allergies     Medication List    TAKE these medications   acetaminophen 500 MG tablet Commonly known as:  TYLENOL Take 500 mg by mouth every 6 (six) hours as needed (for pain.).   apixaban 5 MG Tabs tablet Commonly known as:  ELIQUIS Take 1 tablet (5 mg total) by mouth 2 (two) times daily.   aspirin EC 81 MG tablet Take 81 mg by mouth daily.   docusate sodium 100 MG capsule Commonly known as:  COLACE Take 1 capsule (100 mg total) by mouth daily.   finasteride 5 MG tablet Commonly known as:  PROSCAR Take 1 tablet (5 mg total) by mouth at bedtime.   lacosamide 200 MG Tabs tablet Commonly known as:  VIMPAT Take 1 tablet (200 mg total) by mouth 2 (two) times daily.   Melatonin 3 MG Tabs Take 2 tablets (6 mg total) by mouth at bedtime.   metoprolol tartrate 50 MG tablet Commonly known as:  LOPRESSOR Take 50  mg by mouth 2 (two) times daily.   MULTIVITAMIN ADULT Tabs Take 1 tablet by mouth daily.   pantoprazole 40 MG tablet Commonly known as:  PROTONIX Take 1 tablet (40 mg total) by mouth daily.   polyethylene glycol packet Commonly known as:  MIRALAX / GLYCOLAX Take 17 g by mouth daily.   polyvinyl alcohol 1.4 % ophthalmic solution Commonly known as:  LIQUIFILM TEARS Place 1 drop into both eyes as needed for dry eyes.   QUEtiapine 25 MG tablet Commonly known as:  SEROQUEL Take 0.5 tablets (12.5 mg total) by mouth 2 (two) times daily as needed (agitation). What changed:  Another medication with the same name was changed. Make sure you understand how and when to take each.   QUEtiapine 25 MG tablet Commonly known as:  SEROQUEL Take 0.5 tablets (12.5 mg total) by mouth 2 (two) times daily. At 4 pm and 10 pm as well as prn. What changed:    when to take this  additional instructions  tamsulosin 0.4 MG Caps capsule Commonly known as:  FLOMAX Take 1 capsule (0.4 mg total) by mouth daily after supper.   traZODone 50 MG tablet Commonly known as:  DESYREL Take 1 tablet (50 mg total) by mouth at bedtime.   valproic acid 250 MG capsule Commonly known as:  DEPAKENE Take 1 capsule (250 mg total) by mouth 2 (two) times daily for 26 days.   vitamin B-12 1000 MCG tablet Commonly known as:  CYANOCOBALAMIN Take 1,000 mcg by mouth daily.         DISCHARGE INSTRUCTIONS:   DIET:  Cardiac diet  DISCHARGE CONDITION:  Stable  ACTIVITY:  Activity as tolerated  OXYGEN:  Home Oxygen: No.   Oxygen Delivery: room air  DISCHARGE LOCATION:  home   If you experience worsening of your admission symptoms, develop shortness of breath, life threatening emergency, suicidal or homicidal thoughts you must seek medical attention immediately by calling 911 or calling your MD immediately  if symptoms less severe.  You Must read complete instructions/literature along with all the possible  adverse reactions/side effects for all the Medicines you take and that have been prescribed to you. Take any new Medicines after you have completely understood and accpet all the possible adverse reactions/side effects.   Please note  You were cared for by a hospitalist during your hospital stay. If you have any questions about your discharge medications or the care you received while you were in the hospital after you are discharged, you can call the unit and asked to speak with the hospitalist on call if the hospitalist that took care of you is not available. Once you are discharged, your primary care physician will handle any further medical issues. Please note that NO REFILLS for any discharge medications will be authorized once you are discharged, as it is imperative that you return to your primary care physician (or establish a relationship with a primary care physician if you do not have one) for your aftercare needs so that they can reassess your need for medications and monitor your lab values.     Today   No acute events overnight, mental status back to baseline.  No further seizure activity.  Will discharge home today.  VITAL SIGNS:  Blood pressure (!) 146/82, pulse 62, temperature 98.2 F (36.8 C), temperature source Oral, resp. rate 18, height (!) 6" (0.152 m), weight 71.1 kg, SpO2 96 %.  I/O:    Intake/Output Summary (Last 24 hours) at 07/16/2018 1545 Last data filed at 07/16/2018 1300 Gross per 24 hour  Intake 720 ml  Output -  Net 720 ml    PHYSICAL EXAMINATION:  GENERAL:  78 y.o.-year-old patient lying in the bed with no acute distress.  EYES: Pupils equal, round, reactive to light and accommodation. No scleral icterus. Extraocular muscles intact.  HEENT: Head atraumatic, normocephalic. Oropharynx and nasopharynx clear.  NECK:  Supple, no jugular venous distention. No thyroid enlargement, no tenderness.  LUNGS: Normal breath sounds bilaterally, no wheezing,  rales,rhonchi. No use of accessory muscles of respiration.  CARDIOVASCULAR: S1, S2 normal. No murmurs, rubs, or gallops.  ABDOMEN: Soft, non-tender, non-distended. Bowel sounds present. No organomegaly or mass.  EXTREMITIES: No pedal edema, cyanosis, or clubbing.  NEUROLOGIC: Cranial nerves II through XII are intact. No focal motor or sensory defecits b/l. Globally weak.  PSYCHIATRIC: The patient is alert and oriented x 1.  SKIN: No obvious rash, lesion, or ulcer.   DATA REVIEW:   CBC Recent Labs  Lab 07/16/18  0407  WBC 5.3  HGB 11.5*  HCT 34.3*  PLT 163    Chemistries  Recent Labs  Lab 07/15/18 1127 07/16/18 0407  NA 139 137  K 3.7 3.4*  CL 105 103  CO2 25 24  GLUCOSE 130* 96  BUN 14 12  CREATININE 0.57* 0.58*  CALCIUM 8.9 8.6*  AST 26  --   ALT 14  --   ALKPHOS 65  --   BILITOT 0.7  --     Cardiac Enzymes No results for input(s): TROPONINI in the last 168 hours.  Microbiology Results  Results for orders placed or performed during the hospital encounter of 07/15/18  Gastrointestinal Panel by PCR , Stool     Status: None   Collection Time: 07/15/18  5:28 PM  Result Value Ref Range Status   Campylobacter species NOT DETECTED NOT DETECTED Final   Plesimonas shigelloides NOT DETECTED NOT DETECTED Final   Salmonella species NOT DETECTED NOT DETECTED Final   Yersinia enterocolitica NOT DETECTED NOT DETECTED Final   Vibrio species NOT DETECTED NOT DETECTED Final   Vibrio cholerae NOT DETECTED NOT DETECTED Final   Enteroaggregative E coli (EAEC) NOT DETECTED NOT DETECTED Final   Enteropathogenic E coli (EPEC) NOT DETECTED NOT DETECTED Final   Enterotoxigenic E coli (ETEC) NOT DETECTED NOT DETECTED Final   Shiga like toxin producing E coli (STEC) NOT DETECTED NOT DETECTED Final   Shigella/Enteroinvasive E coli (EIEC) NOT DETECTED NOT DETECTED Final   Cryptosporidium NOT DETECTED NOT DETECTED Final   Cyclospora cayetanensis NOT DETECTED NOT DETECTED Final    Entamoeba histolytica NOT DETECTED NOT DETECTED Final   Giardia lamblia NOT DETECTED NOT DETECTED Final   Adenovirus F40/41 NOT DETECTED NOT DETECTED Final   Astrovirus NOT DETECTED NOT DETECTED Final   Norovirus GI/GII NOT DETECTED NOT DETECTED Final   Rotavirus A NOT DETECTED NOT DETECTED Final   Sapovirus (I, II, IV, and V) NOT DETECTED NOT DETECTED Final    Comment: Performed at [facility] [address] [place], [place] [address]    RADIOLOGY:  Ct Head Wo Contrast  Result Date: 07/15/2018 CLINICAL DATA:  Seizure.  Previous craniotomy for meningioma removal EXAM: CT HEAD WITHOUT CONTRAST TECHNIQUE: Contiguous axial images were obtained from the base of the skull through the vertex without intravenous contrast. COMPARISON:  Multiple prior CT examinations, most recent July 13, 2018 FINDINGS: Brain: The ventricles are diffusely enlarged. The ventricles are peer unchanged compared to 2 days prior. Over time, the lateral and third ventricles appear somewhat larger. The ventricles in proportion are larger than the sulci currently. There is no intracranial mass, hemorrhage, extra-axial fluid collection, or midline shift. There is encephalomalacia in the left frontal and temporal lobe regions. There is evidence of a prior small infarct in the medial right frontal lobe, stable. No acute infarct is demonstrable. Vascular: No hyperdense vessel. There is calcification in the distal right vertebral artery and in the carotid siphon regions bilaterally. Skull: Patient has had a previous left frontal-parietal craniotomy. Bony calvarium otherwise appears intact. Sinuses/Orbits: There is mucosal thickening in several ethmoid air cells. Other visualized paranasal sinuses are clear. Frontal sinuses are aplastic. Visualized orbits appear symmetric bilaterally. Other: Mastoid air cells are clear. IMPRESSION: Enlarged ventricles which have become larger over time. 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 There is also felt to be mild underlying atrophy. Encephalomalacia in the left frontal and left temporal lobe regions is again noted, stable compared to most recent study. Resolution of  prior hemorrhage on the left compared to August 2019. Currently there is no mass or hemorrhage. No acute infarct evident. Foci of arterial vascular calcification noted. Mucosal thickening noted in several ethmoid air cells. Postoperative craniotomy defect involving portions of the left frontal and parietal bone superiorly. No new bone lesion. Electronically Signed   By: Lowella Grip III M.D.   On: 07/15/2018 13:25   Ct Lumbar Spine Wo Contrast  Result Date: 07/16/2018 CLINICAL DATA:  Right leg weakness.  Generalized muscle weakness EXAM: CT LUMBAR SPINE WITHOUT CONTRAST TECHNIQUE: Multidetector CT imaging of the lumbar spine was performed without intravenous contrast administration. Multiplanar CT image reconstructions were also generated. COMPARISON:  Abdominal CT 12/25/2017 FINDINGS: Segmentation: 5 lumbar type vertebrae. Alignment: Mild anterolisthesis at L4-5 Vertebrae: L1 superior endplate fracture with acute or subacute appearance. Height loss is minimal. Benign sclerosis in the T12 body, stable. Paraspinal and other soft tissues: No acute finding. Extensive atherosclerosis of the aorta.  Left renal cystic density. Disc levels: T12- L1: Spondylosis.  No impingement L1-L2: Mild spondylosis.  No impingement L2-L3: Mild disc bulging and facet spurring.  No noted impingement L3-L4: Greatest level of degenerative disc narrowing which is moderate. There is mild spinal stenosis. Patent foramina L4-L5: Degenerative facet spurring that is bulky, with mild anterolisthesis. Mild disc bulging. Moderate spinal stenosis. The foramina are patent. L5-S1:Mild facet spurring and disc narrowing with endplate ridging. No impingement noted. IMPRESSION: 1. Acute or subacute L1 superior endplate fracture with minor height loss.  2. Disc and facet degeneration most notable at L4-5 where there is bulky facet spurring and anterolisthesis. There is associated moderate spinal stenosis. 3. Diffusely patent foramina. No focal finding to explain right leg symptoms. Electronically Signed   By: Monte Fantasia M.D.   On: 07/16/2018 13:21   Dg Chest Port 1 View  Result Date: 07/15/2018 CLINICAL DATA:  Seizures today. EXAM: PORTABLE CHEST 1 VIEW COMPARISON:  PA and lateral chest 07/04/2018 and 06/11/2017. FINDINGS: There is cardiomegaly. Pacing device in place. No pneumothorax or pleural effusion. 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 No acute or focal bony abnormality. IMPRESSION: Cardiomegaly without acute disease. Electronically Signed   By: Inge Rise M.D.   On: 07/15/2018 15:57      Management plans discussed with the patient, family and they are in agreement.  CODE STATUS:     Code Status Orders  (From admission, onward)         Start     Ordered   07/15/18 1530  Full code  Continuous     07/15/18 1529       TOTAL TIME TAKING CARE OF THIS PATIENT: 40 minutes.    Henreitta Leber M.D on 07/16/2018 at 3:45 PM  Between 7am to 6pm - Pager - [phone]  After 6pm go to [email or web address] - [employer] [facility]  Office  [phone]  CC: Primary care physician; Lenard Simmer, MD

## 2018-07-16 NOTE — Progress Notes (Signed)
Patient discharged home via EMS. Discharge paperwork given to Tyke Outman, patient's daughter.

## 2018-07-16 NOTE — Progress Notes (Signed)
Subjective: No seizure activity noted overnight.  Family reports that patient is back to baseline.    Objective: Current vital signs: BP (!) 145/78 (BP Location: Left Arm)   Pulse 76   Temp 98 F (36.7 C)   Resp 18   Ht (!) 6" (0.152 m)   Wt 71.1 kg   SpO2 92%   BMI 3061.25 kg/m  Vital signs in last 24 hours: Temp:  [97.7 F (36.5 C)-98 F (36.7 C)] 98 F (36.7 C) (01/01 0433) Pulse Rate:  [40-135] 76 (01/01 0433) Resp:  [16-26] 18 (01/01 0433) BP: (127-170)/(78-129) 145/78 (01/01 0433) SpO2:  [90 %-97 %] 92 % (01/01 0433) Weight:  [71.1 kg-73 kg] 71.1 kg (12/31 1800)  Intake/Output from previous day: No intake/output data recorded. Intake/Output this shift: Total I/O In: 360 [P.O.:360] Out: -  Nutritional status:  Diet Order            Diet Heart Room service appropriate? Yes; Fluid consistency: Thin  Diet effective now              Neurologic Exam: Mental Status: Alert.  No speech other than an occasional "yes".  Has difficulty following commands.   Cranial Nerves: II: Discs flat bilaterally; Blinks to bilateral confrontationl, pupils equal, round, reactive to light and accommodation III,IV, VI: ptosis not present, extra-ocular motions intact bilaterally V,VII: smile symmetric, facial light touch sensation normal bilaterally VIII: hearing normal bilaterally IX,X: gag reflex present XI: bilateral shoulder shrug XII: midline tongue extension Motor: Lifts all extremities against gravity. Sensory: Responds to stimuli throughout  Lab Results: Basic Metabolic Panel: Recent Labs  Lab 07/13/18 1057 07/15/18 1127 07/16/18 0407  NA 142 139 137  K 3.4* 3.7 3.4*  CL 109 105 103  CO2 26 25 24   GLUCOSE 107* 130* 96  BUN 12 14 12   CREATININE 0.59* 0.57* 0.58*  CALCIUM 8.5* 8.9 8.6*    Liver Function Tests: Recent Labs  Lab 07/15/18 1127  AST 26  ALT 14  ALKPHOS 65  BILITOT 0.7  PROT 7.1  ALBUMIN 3.8   No results for input(s): LIPASE, AMYLASE in  the last 168 hours. No results for input(s): AMMONIA in the last 168 hours.  CBC: Recent Labs  Lab 07/13/18 1057 07/15/18 1127 07/16/18 0407  WBC 4.7 5.0 5.3  NEUTROABS 3.3  --   --   HGB 11.7* 12.1* 11.5*  HCT 36.4* 37.2* 34.3*  MCV 99.5 98.4 95.5  PLT 160 155 163    Cardiac Enzymes: No results for input(s): CKTOTAL, CKMB, CKMBINDEX, TROPONINI in the last 168 hours.  Lipid Panel: No results for input(s): CHOL, TRIG, HDL, CHOLHDL, VLDL, LDLCALC in the last 168 hours.  CBG: No results for input(s): GLUCAP in the last 168 hours.  Microbiology: Results for orders placed or performed during the hospital encounter of 07/15/18  Gastrointestinal Panel by PCR , Stool     Status: None   Collection Time: 07/15/18  5:28 PM  Result Value Ref Range Status   Campylobacter species NOT DETECTED NOT DETECTED Final   Plesimonas shigelloides NOT DETECTED NOT DETECTED Final   Salmonella species NOT DETECTED NOT DETECTED Final   Yersinia enterocolitica NOT DETECTED NOT DETECTED Final   Vibrio species NOT DETECTED NOT DETECTED Final   Vibrio cholerae NOT DETECTED NOT DETECTED Final   Enteroaggregative E coli (EAEC) NOT DETECTED NOT DETECTED Final   Enteropathogenic E coli (EPEC) NOT DETECTED NOT DETECTED Final   Enterotoxigenic E coli (ETEC) NOT DETECTED NOT DETECTED Final  Shiga like toxin producing E coli (STEC) NOT DETECTED NOT DETECTED Final   Shigella/Enteroinvasive E coli (EIEC) NOT DETECTED NOT DETECTED Final   Cryptosporidium NOT DETECTED NOT DETECTED Final   Cyclospora cayetanensis NOT DETECTED NOT DETECTED Final   Entamoeba histolytica NOT DETECTED NOT DETECTED Final   Giardia lamblia NOT DETECTED NOT DETECTED Final   Adenovirus F40/41 NOT DETECTED NOT DETECTED Final   Astrovirus NOT DETECTED NOT DETECTED Final   Norovirus GI/GII NOT DETECTED NOT DETECTED Final   Rotavirus A NOT DETECTED NOT DETECTED Final   Sapovirus (I, II, IV, and V) NOT DETECTED NOT DETECTED Final     Comment: Performed at [facility], [address] [place], [place] [address]    Coagulation Studies: No results for input(s): LABPROT, INR in the last 72 hours.  Imaging: Ct Head Wo Contrast  Result Date: 07/15/2018 CLINICAL DATA:  Seizure.  Previous craniotomy for meningioma removal EXAM: CT HEAD WITHOUT CONTRAST TECHNIQUE: Contiguous axial images were obtained from the base of the skull through the vertex without intravenous contrast. COMPARISON:  Multiple prior CT examinations, most recent July 13, 2018 FINDINGS: Brain: The ventricles are diffusely enlarged. The ventricles are peer unchanged compared to 2 days prior. Over time, the lateral and third ventricles appear somewhat larger. The ventricles in proportion are larger than the sulci currently. There is no intracranial mass, hemorrhage, extra-axial fluid collection, or midline shift. There is encephalomalacia in the left frontal and temporal lobe regions. There is evidence of a prior small infarct in the medial right frontal lobe, stable. No acute infarct is demonstrable. Vascular: No hyperdense vessel. There is calcification in the distal right vertebral artery and in the carotid siphon regions bilaterally. Skull: Patient has had a previous left frontal-parietal craniotomy. Bony calvarium otherwise appears intact. Sinuses/Orbits: There is mucosal thickening in several ethmoid air cells. Other visualized paranasal sinuses are clear. Frontal sinuses are aplastic. Visualized orbits appear symmetric bilaterally. Other: Mastoid air cells are clear. IMPRESSION: Enlarged ventricles which have become larger over time. 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 There is also felt to be mild underlying atrophy. Encephalomalacia in the left frontal and left temporal lobe regions is again noted, stable compared to most recent study. Resolution of prior hemorrhage on the left compared to August 2019. Currently there is no mass or  hemorrhage. No acute infarct evident. Foci of arterial vascular calcification noted. Mucosal thickening noted in several ethmoid air cells. Postoperative craniotomy defect involving portions of the left frontal and parietal bone superiorly. No new bone lesion. Electronically Signed   By: Lowella Grip III M.D.   On: 07/15/2018 13:25   Dg Chest Port 1 View  Result Date: 07/15/2018 CLINICAL DATA:  Seizures today. EXAM: PORTABLE CHEST 1 VIEW COMPARISON:  PA and lateral chest 07/04/2018 and 06/11/2017. FINDINGS: There is cardiomegaly. Pacing device in place. No pneumothorax or pleural effusion. 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 No acute or focal bony abnormality. IMPRESSION: Cardiomegaly without acute disease. Electronically Signed   By: Inge Rise M.D.   On: 07/15/2018 15:57    Medications:  I have reviewed the patient's current medications. Scheduled: . apixaban  5 mg Oral BID  . finasteride  5 mg Oral QHS  . lacosamide  200 mg Oral BID  . Melatonin  5 mg Oral QHS  . metoprolol tartrate  50 mg Oral BID  . multivitamin with minerals  1 tablet Oral Daily  . pantoprazole  40 mg Oral Daily  . QUEtiapine  12.5 mg  Oral Once  . QUEtiapine  12.5 mg Oral BID  . tamsulosin  0.4 mg Oral QPC supper  . traZODone  50 mg Oral QHS  . valproic acid  250 mg Oral BID  . vitamin B-12  1,000 mcg Oral Daily    Assessment/Plan: No seizures noted since admission.  Patient tolerating addition of Depakote.  Family continues to reports that RLE is weaker.  Head CT shows no etiology.  Patient unable to have MRI.  Will check lumbar spine for etiology as well.   VPA level <10.  EEG showed no continued seizure activity.    Recommendations: 1. Continue Vimpat and Depakote at current doses. 2.  CT of the lumbar spine.  If no etiology ofr RLE weakness noted, weakness may be a seizure residual but can rule out other etiologies on an outpatient basis with NCV/EMG.   3. Patient to keep follow up with  neurology on an outpatient basis.   LOS: 1 day   Alexis Goodell, MD Neurology [phone] 07/16/2018  10:41 AM

## 2018-07-17 ENCOUNTER — Encounter: Payer: Medicare Other | Admitting: Speech Pathology

## 2018-07-17 ENCOUNTER — Encounter: Payer: Medicare Other | Admitting: Occupational Therapy

## 2018-07-21 ENCOUNTER — Encounter: Payer: Self-pay | Admitting: Physical Medicine & Rehabilitation

## 2018-07-21 ENCOUNTER — Encounter: Payer: Medicare Other | Attending: Physical Medicine & Rehabilitation | Admitting: Physical Medicine & Rehabilitation

## 2018-07-21 ENCOUNTER — Telehealth: Payer: Self-pay | Admitting: Cardiovascular Disease

## 2018-07-21 VITALS — BP 154/89 | HR 88 | Ht 72.0 in | Wt 165.0 lb

## 2018-07-21 DIAGNOSIS — I482 Chronic atrial fibrillation, unspecified: Secondary | ICD-10-CM

## 2018-07-21 DIAGNOSIS — S069X0S Unspecified intracranial injury without loss of consciousness, sequela: Secondary | ICD-10-CM

## 2018-07-21 DIAGNOSIS — F09 Unspecified mental disorder due to known physiological condition: Secondary | ICD-10-CM

## 2018-07-21 DIAGNOSIS — R569 Unspecified convulsions: Secondary | ICD-10-CM

## 2018-07-21 DIAGNOSIS — S069XAS Unspecified intracranial injury with loss of consciousness status unknown, sequela: Secondary | ICD-10-CM

## 2018-07-21 DIAGNOSIS — G3189 Other specified degenerative diseases of nervous system: Secondary | ICD-10-CM | POA: Diagnosis present

## 2018-07-21 NOTE — Progress Notes (Signed)
Subjective:    Patient ID: [name], male    DOB: [birth year], 78 y.o.   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  We discharged him from inpatient rehab on November 27.  He was back in the hospital with seizures x 2. I reviewed both of the hospital visits. He is currently on max dose of vimpat 256m bid. He was placed on depakote for his most recent seizures as a secondary agent.  Family feels that he has declined since being on the Depakote and in general has not been able to move and function as well as he had been.  He continues to have significant cognitive and language deficits.  He sundown's a bit in the evenings.  Essentially he is relegated to a wheelchair.  He can assist somewhat with transfers but requires physical help in a gait belt for the movement.  He remains on low-dose Seroquel in the afternoon and evening for his sundowning symptoms.  He is on Eliquis and baby aspirin for cardiac and A. fib prophylaxis.  He is at home with his family who provides 24-hour care.  He requires significant physical and cognitive/behavioral assistance.  I reviewed his most recent lab work and radiographical reports.  CT of the head on January 1 shows generalized atrophy particularly in the frontotemporal areas without new disease.  Lab work was unremarkable for the most part.  Most recent Depakote level was less than 10   Pain Inventory Average Pain 3 Pain Right Now 0 My pain is na  In the last 24 hours, has pain interfered with the following? General activity 4 Relation with others 4 Enjoyment of life 4 What TIME of day is your pain at its worst? daytime Sleep (in general) Fair  Pain is worse with: bending and some activites Pain improves with: na Relief from Meds: na  Mobility use a walker ability to climb steps?  no do you drive?  no  Function retired I need assistance with the following:  feeding, dressing, bathing, toileting, meal prep, household  duties and shopping  Neuro/Psych bladder control problems trouble walking confusion depression anxiety  Prior Studies Any changes since last visit?  no  Physicians involved in your care Any changes since last visit?  no   Family History  Problem Relation Age of Onset  . Cancer Mother   . Cardiomyopathy Sister   . Cancer Other        Breast  . Suicidality Father   . Anxiety disorder Father   . Schizophrenia Brother    Social History   Socioeconomic History  . Marital status: Widowed    Spouse name: Not on file  . Number of children: Not on file  . Years of education: Not on file  . Highest education level: Not on file  Occupational History  . Not on file  Social Needs  . Financial resource strain: Not on file  . Food insecurity:    Worry: Not on file    Inability: Not on file  . Transportation needs:    Medical: Not on file    Non-medical: Not on file  Tobacco Use  . Smoking status: Former Smoker    Packs/day: 1.00    Years: 35.00    Pack years: 35.00    Types: Cigarettes    Last attempt to quit: 1980    Years since quitting: 40.0  . Smokeless tobacco: Former [employer] developer   Quit date: 1989  Substance and Sexual Activity  . Alcohol use: Yes    Alcohol/week: 28.0 standard drinks    Types: 28 Cans of beer per week    Comment: 4 beers per day  . Drug use: No  . Sexual activity: Yes  Lifestyle  . Physical activity:    Days per week: Not on file    Minutes per session: Not on file  . Stress: Not on file  Relationships  . Social connections:    Talks on phone: Not on file    Gets together: Not on file    Attends religious service: Not on file    Active member of club or organization: Not on file    Attends meetings of clubs or organizations: Not on file    Relationship status: Not on file  Other Topics Concern  . Not on file  Social History Narrative  . Not on file   Past Surgical History:  Procedure Laterality Date  . BASAL CELL CARCINOMA EXCISION       "head X 2; back, chest" (05/21/2016)  . CARDIAC CATHETERIZATION     Past caths in 2008, 2006, 2005 & 2003  . CATARACT EXTRACTION W/ INTRAOCULAR LENS  IMPLANT, BILATERAL Bilateral 2005  . CORONARY ANGIOPLASTY WITH STENT PLACEMENT  07/02/09   Most recent cath by Dr. Gwenlyn Found 07/02/09 was remarkable for patent stents w/an 80% in-stent stenosis within the obtuse marginal branch stent, as well as 60% ostial RCA stenosis which was stented with a Taxus Liberte drug-eluting stent.   . CORONARY ANGIOPLASTY WITH STENT PLACEMENT     "I have 12 stents in my heart" (05/21/2016)  . CRANIOTOMY  08/15/2012   At [facility]. BrainLAB-guided left frontoparietal craniotomy w/removal of a meningioma.   . EP IMPLANTABLE DEVICE N/A 05/21/2016   Procedure: ICD Implant;  Surgeon: Evans Lance, MD;  Location: [place] [facility];  Service: Cardiovascular;  Laterality: N/A;  . INGUINAL HERNIA REPAIR Right 11/23/2014   Procedure: HERNIA REPAIR INGUINAL ADULT;  Surgeon: Rochel Brome, MD;  Location: [facility];  Service: General;  Laterality: Right;  . INGUINAL HERNIA REPAIR Bilateral 2010-2016  . JOINT REPLACEMENT    . THROAT SURGERY     "laser for Barret's esophagus"  . TONSILLECTOMY    . TOTAL KNEE ARTHROPLASTY Left 09/2010   Surgery was complicated by PAF with a pulmonary embolism documented by CT angiogram.   Past Medical History:  Diagnosis Date  . A-fib ([place])   . AICD (automatic cardioverter/defibrillator) present 05/21/2016  . Arthritis    "back of my neck extending to back of head" (05/21/2016)  . Barrett's esophagus    Gets periodic endoscopies & biopsies. Coumadin has been held in the past for these precedures.   . Basal cell carcinoma    "scalp; chest; back"  . Blurred vision   . CAD (coronary artery disease)    S/P stenting of his LAD, circumflex & marginal branch as well as his RCA in 2008.  . Cardiomyopathy ([place])    Significant ischemic cardiomyopathy. ECHO 08/27/12: EF = 45-50% (Previous Echo revealed EF =  25%)  . Carotid artery occlusion    Carotid Doppler 08/27/12 SUMMARY - Bilateral ICAs: Demonstrated normal patency without evidence of a significant diamenter reduction, tortuosity or any other vascular abnormality. This was a Normal carotid duplex Doppler Evaluation.  . Cerebral atherosclerosis    Carotid Doppler 08/27/12 SUMMARY - Bilateral ICAs: Demonstrated normal patency without evidence of a significant diamenter reduction, tortuosity or any other vascular abnormality.  This was a Normal carotid duplex Doppler Evaluation.  . CHF (congestive heart failure) ([place])   . Chronic anticoagulation    For PE & PAF  . Claudication ([place])    right ABI of 0.84  . Coronary artery disease    Most recent cath by Dr. Gwenlyn Found 07/02/09 was remarkable for patent stents w/an 80% in-stent stenosis within the obtuse marginal branch stent, as well as 60% ostial RCA stenosis which was stented with a Taxus Liberte drug-eluting stent. Nuc stress test 09/03/12 was low risk & showed an inferolateral scar. & an EF of 39%  . Daily headache    "from the arthritis in back of neck" (05/21/2016)  . DVT (deep venous thrombosis) ([place]) 09/2010   "after knee replacement"  . GERD (gastroesophageal reflux disease)   . H/O mitral valve disorder   . History of hiatal hernia   . History of kidney stones   . History of stomach ulcers   . Hyperlipidemia   . Hypertension   . Meningioma ([place]) 08/15/12   BrainLAB-guided left frontoparietal craniotomy w/removal of a meningioma.   . Obstructive sleep apnea    Sleep Study in 2011 AHI 3.2. "occasionally wear my mask" (05/21/2016)  . Paroxysmal atrial fibrillation (H[facility])   . PSVT (paroxysmal supraventricular tachycardia) ([place])    2D ECHO 08/27/12: EF = 45-50% (Previous Echo revealed EF = 25%)  . Pulmonary embolism ([place]) 09/2010   "after knee replacement"  . Shortness of breath dyspnea   . Sick sinus syndrome ([place])   . TIA (transient ischemic attack)    BP (!) 154/89   Pulse 88   Ht 6'  (1.829 m)   Wt 165 lb (74.8 kg)   SpO2 95%   BMI 22.38 kg/m   Opioid Risk Score:   Fall Risk Score:  `1  Depression screen PHQ 2/9  No flowsheet data found.   Review of Systems  Constitutional: Negative.   HENT: Negative.   Eyes: Negative.   Respiratory: Positive for apnea and shortness of breath.   Cardiovascular: Negative.   Gastrointestinal: Positive for constipation.  Endocrine: Negative.   Genitourinary: Negative.   Musculoskeletal: Positive for gait problem.  Skin: Negative.   Allergic/Immunologic: Negative.   Hematological: Negative.   Psychiatric/Behavioral: Positive for confusion and dysphoric mood. The patient is nervous/anxious.   All other systems reviewed and are negative.      Objective:   Physical Exam  General: No acute distress HEENT: EOMI, oral membranes moist Cards: reg rate  Chest: normal effort Abdomen: Soft, NT, ND Skin: dry, intact Extremities: no edema Neuro: Pt is cognitively appropriate with normal insight, memory, and awareness. Cranial nerves 2-12 are intact. Sensory exam is normal. Reflexes are 2+ in all 4's. Fine motor coordination is intact. No tremors.  Motor function is 3+ to 4 out of 5 throughout but patient has difficulty with focus and purposeful movement.  Remains dysarthric and globally a phasic.  We attempted to transfer and he had difficulty with hip and knee support and and working with me to move from a seated to standing position.  It was primarily me pulling him up with the way spelt. Musculoskeletal: Full ROM, No pain with AROM or PROM in the neck, trunk, or extremities. 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             -continue Vimpat  and Depakote as directed s   -I did order a Depakote level, C met and CBC for tomorrow morning per home health nurse             -He has follow-up with neurology at [facility] next week they can discuss options.  If the Depakote is truly causing some cognitive sedating side effects, they can look at other options.  2. DVT Prophylaxis/Anticoagulation:-Eliquis 3. Pain Management:tylenol prn. 4. CAD with ICM/CHF/ICD: Continue aspirin and metoprolol 5. A fib with WUJ:[id number] and Eliquis. 6..Bouts of agitation/sundowning:    Seroquel at 4 PM and 10 PM daily  7. Seizures: On Vimpat 252m twice daily--continue  -depakote 252mbid--continue for now  - -I did order a Depakote level, C met and CBC for tomorrow morning per home health nurse             -[facility].  If the Depakote is truly causing some cognitive sedating side effects, they can look at other options.                Thirty minutes of face to face patient care time were spent during this visit. All questions were encouraged and answered. Follow up in 3 months.

## 2018-07-21 NOTE — Patient Instructions (Addendum)
CHECK DEPAKOTE LEVEL, COMPLETE METABOLIC PANEL AND COMPLETE BLOOD COUNT  IN THE MORNING TOMORROW PER HHRN.  CONTINUE DEPAKOTE UNTIL YOU SEE YOUR NEUROLOGIST.

## 2018-07-21 NOTE — Telephone Encounter (Signed)
New Message   Pt c/o medication issue:  1. Name of Medication: aspirin EC 81 MG tablet  2. How are you currently taking this medication (dosage and times per day)? Take 81 mg by mouth daily  3. Are you having a reaction (difficulty breathing--STAT)?   4. What is your medication issue? States the aspirin might be causing and interaction with his seizure medication and wants to know if Dr. Gwenlyn Found wants him to continue the Aspirin. Please call

## 2018-07-21 NOTE — Telephone Encounter (Signed)
Pt's fiance updated with Pharm D's recommendation. Verbalized understanding.

## 2018-07-21 NOTE — Telephone Encounter (Signed)
Spoke with pt's fiance who states she was told that ASA can cause an interaction with Seizure medication and was calling to inquire. She reports pt is taking dapakene and vimpat. He was recently started on the dapakene on 12/20 and had another seizure New years eve. Will route to Pharm D and MD.

## 2018-07-21 NOTE — Telephone Encounter (Signed)
The only interaction is between aspirin and the Depakene (valproic acid).  Aspirin can actually increase the valproic acid levels, which would decrease risk of seizure.  So he should continue to take the aspirin unless his MD is worried about him hitting his head and causing a bleed.  And then they would need to discuss with Dr. Gwenlyn Found.

## 2018-07-22 ENCOUNTER — Other Ambulatory Visit: Payer: Self-pay

## 2018-07-22 ENCOUNTER — Telehealth: Payer: Self-pay

## 2018-07-22 NOTE — Telephone Encounter (Signed)
Patients wife and HHRN have requested that in addition to the Depakote levels and other labs that he receive a vimpat level test as well to take to the neurologist.

## 2018-07-22 NOTE — Telephone Encounter (Signed)
EMMI Follow-up: Noted on the report that patient had unfilled Rx.  No change in medications during this hospital stay.  I called to talk with Mr. Purdy and finance, [name] said he was unable to talk but she was his caregiver and would be glad to help.  Asked about unfilled medication and she thought they already had all his Rx's filled.  I let her know it was a possibility that the automated system may have recorded the wrong response and apologized.  I also let her know there would be another automated call with a different series of questions and to let us know if they had any concerns at that time. No needs noted for today.

## 2018-07-23 ENCOUNTER — Encounter: Payer: Medicare Other | Admitting: Speech Pathology

## 2018-07-23 ENCOUNTER — Encounter: Payer: Medicare Other | Admitting: Occupational Therapy

## 2018-07-23 NOTE — Telephone Encounter (Signed)
I discussed with family yesterday regarding the Vimpat level and the fact that these are not typically checked.  I did call neurology today who agreed.  He recommended discussing with neurology at [facility] to get their recommendations moving forward. "a therepeutic level" doesn't necessarily coorelate with efficacy. Did [facility] ask for a level?

## 2018-07-24 NOTE — Telephone Encounter (Signed)
HHRN from [facility] called to check on what the doctor said about checking the levels. Told HHRN that is was discussed with patient.

## 2018-07-25 ENCOUNTER — Emergency Department
Admission: EM | Admit: 2018-07-25 | Discharge: 2018-07-25 | Disposition: A | Discharge status: Home / Self Care | Payer: Medicare Other | Attending: Emergency Medicine | Admitting: Emergency Medicine

## 2018-07-25 ENCOUNTER — Other Ambulatory Visit: Payer: Self-pay

## 2018-07-25 ENCOUNTER — Encounter: Payer: Medicare Other | Admitting: Occupational Therapy

## 2018-07-25 ENCOUNTER — Emergency Department: Payer: Medicare Other

## 2018-07-25 ENCOUNTER — Encounter: Payer: Medicare Other | Admitting: Speech Pathology

## 2018-07-25 DIAGNOSIS — I509 Heart failure, unspecified: Secondary | ICD-10-CM | POA: Insufficient documentation

## 2018-07-25 DIAGNOSIS — J449 Chronic obstructive pulmonary disease, unspecified: Secondary | ICD-10-CM | POA: Insufficient documentation

## 2018-07-25 DIAGNOSIS — I110 Hypertensive heart disease with heart failure: Secondary | ICD-10-CM | POA: Diagnosis not present

## 2018-07-25 DIAGNOSIS — Z79899 Other long term (current) drug therapy: Secondary | ICD-10-CM | POA: Insufficient documentation

## 2018-07-25 DIAGNOSIS — Z7982 Long term (current) use of aspirin: Secondary | ICD-10-CM | POA: Diagnosis not present

## 2018-07-25 DIAGNOSIS — I2510 Atherosclerotic heart disease of native coronary artery without angina pectoris: Secondary | ICD-10-CM | POA: Diagnosis not present

## 2018-07-25 DIAGNOSIS — R569 Unspecified convulsions: Secondary | ICD-10-CM | POA: Diagnosis present

## 2018-07-25 DIAGNOSIS — Z87891 Personal history of nicotine dependence: Secondary | ICD-10-CM | POA: Insufficient documentation

## 2018-07-25 LAB — TROPONIN I: Troponin I: <0.03 ng/mL (ref <0.03)

## 2018-07-25 LAB — CBC
HCT: 37 % — ABNORMAL LOW (ref 39.0–52.0)
HEMOGLOBIN: 11.8 g/dL — AB (ref 13.0–17.0)
MCH: 31.8 pg (ref 26.0–34.0)
MCHC: 31.9 g/dL (ref 30.0–36.0)
MCV: 99.7 fL (ref 80.0–100.0)
Platelets: 203 10*3/uL (ref 150–400)
RBC: 3.71 MIL/uL — ABNORMAL LOW (ref 4.22–5.81)
RDW: 14.9 % (ref 11.5–15.5)
WBC: 5.2 10*3/uL (ref 4.0–10.5)
nRBC: 0 % (ref 0.0–0.2)

## 2018-07-25 LAB — URINALYSIS, ROUTINE W REFLEX MICROSCOPIC
Bilirubin Urine: NEGATIVE
Glucose, UA: NEGATIVE mg/dL
Hgb urine dipstick: NEGATIVE
KETONES UR: NEGATIVE mg/dL
Leukocytes, UA: NEGATIVE
Nitrite: NEGATIVE
PROTEIN: NEGATIVE mg/dL
Specific Gravity, Urine: 1.016 (ref 1.005–1.030)
pH: 6 (ref 5.0–8.0)

## 2018-07-25 LAB — BASIC METABOLIC PANEL
Anion gap: 7 (ref 5–15)
BUN: 14 mg/dL (ref 8–23)
CHLORIDE: 104 mmol/L (ref 98–111)
CO2: 29 mmol/L (ref 22–32)
Calcium: 8.9 mg/dL (ref 8.9–10.3)
Creatinine, Ser: 0.74 mg/dL (ref 0.61–1.24)
GFR calc Af Amer: >60 mL/min (ref >60)
GFR calc non Af Amer: >60 mL/min (ref >60)
Glucose, Bld: 111 mg/dL — ABNORMAL HIGH (ref 70–99)
Potassium: 3.9 mmol/L (ref 3.5–5.1)
Sodium: 140 mmol/L (ref 135–145)

## 2018-07-25 LAB — PROTIME-INR
INR: 1.08
Prothrombin Time: 13.9 seconds (ref 11.4–15.2)

## 2018-07-25 LAB — VALPROIC ACID LEVEL: VALPROIC ACID LVL: 31 ug/mL — AB (ref 50.0–100.0)

## 2018-07-25 LAB — APTT: aPTT: 29 seconds (ref 24–36)

## 2018-07-25 MED ORDER — VALPROIC ACID 250 MG PO CAPS: ORAL_CAPSULE | ORAL | 0 refills | Status: AC

## 2018-07-25 MED ORDER — VALPROIC ACID 250 MG PO CAPS
250.0000 mg | ORAL_CAPSULE | Freq: Once | ORAL | Status: AC
  Administered 2018-07-25: 250 mg via ORAL
  Filled 2018-07-25: qty 1

## 2018-07-25 MED ORDER — LACOSAMIDE 50 MG PO TABS
200.0000 mg | ORAL_TABLET | Freq: Once | ORAL | Status: AC
  Administered 2018-07-25: 200 mg via ORAL
  Filled 2018-07-25: qty 4

## 2018-07-25 NOTE — ED Notes (Signed)
Pt is alert and talking, able to answer questions, family at bedside. EDP talking to family at this time.

## 2018-07-25 NOTE — ED Triage Notes (Signed)
Pt arrived via EMS from home with reports of seizure like activity of unknown duration, but per EMS was described as grand-mal.  Pt suffered TBI several months ago, and has had new onset of seizures since then. Per EMS pt initially was not responsive to any stimuli, but is currently responsive to painful stimuli.  IV access was attempted by EMS but unsuccessful.  VS 148/68 p-72 CBG 172, RR 20-22, Oxygen 96-97% RA

## 2018-07-25 NOTE — Discharge Instructions (Signed)
Increase Depakote to 500 mg at bedtime and maintain the morning dose of 250 mg.  Maintain your dose of Vimpat.  Follow-up with your neurologist on your appointment on Tuesday.  Return to the emergency room for any further seizures.

## 2018-07-25 NOTE — ED Notes (Signed)
Patient transported to CT 

## 2018-07-25 NOTE — ED Provider Notes (Signed)
[facility] Emergency Department Provider Note  ____________________________________________  Time seen: Approximately 8:01 AM  I have reviewed the triage vital signs and the nursing notes.   HISTORY  Chief Complaint Seizures  Level 5 caveat:  Portions of the history and physical were unable to be obtained due to post-ictal   HPI [name] is a 78 y.o. 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 fib on Eliquis, cardiomyopathy status post AICD, pulmonary embolism, DVT, OSA, HTN, HLD who presents for evaluation of seizure. Patient has returned home 3 weeks ago after being hospitalized and then rehab for fall and TBI. This is third seizure since going home.  Patient lives at home with 24/7 home health nurse and supervision of 1 of his children.  This morning he was found sitting on the toilet unresponsive by the home health nurse.  According to the son this is similar to prior episodes of seizures he has had in the past.  Patient was taken off of Depakote but restarted again 9 days ago.  Neurology was considering doubling his dose.  Is currently on 250 twice daily.  No recent illness.  Patient did not sustain fall or any injury from this episode.  Patient is unable to provide any history and is postictal at this time.   Past Medical History:  Diagnosis Date  . A-fib ([place])   . AICD (automatic cardioverter/defibrillator) present 05/21/2016  . Arthritis    "back of my neck extending to back of head" (05/21/2016)  . Barrett's esophagus    Gets periodic endoscopies & biopsies. Coumadin has been held in the past for these precedures.   . Basal cell carcinoma    "scalp; chest; back"  . Blurred vision   . CAD (coronary artery disease)    S/P stenting of his LAD, circumflex & marginal branch as well as his RCA in 2008.  . Cardiomyopathy ([place])    Significant ischemic cardiomyopathy. ECHO  08/27/12: EF = 45-50% (Previous Echo revealed EF = 25%)  . Carotid artery occlusion    Carotid Doppler 08/27/12 SUMMARY - Bilateral ICAs: Demonstrated normal patency without evidence of a significant diamenter reduction, tortuosity or any other vascular abnormality. This was a Normal carotid duplex Doppler Evaluation.  . Cerebral atherosclerosis    Carotid Doppler 08/27/12 SUMMARY - Bilateral ICAs: Demonstrated normal patency without evidence of a significant diamenter reduction, tortuosity or any other vascular abnormality. This was a Normal carotid duplex Doppler Evaluation.  . CHF (congestive heart failure) ([place])   . Chronic anticoagulation    For PE & PAF  . Claudication ([place])    right ABI of 0.84  . Coronary artery disease    Most recent cath by Dr. Gwenlyn Found 07/02/09 was remarkable for patent stents w/an 80% in-stent stenosis within the obtuse marginal branch stent, as well as 60% ostial RCA stenosis which was stented with a Taxus Liberte drug-eluting stent. Nuc stress test 09/03/12 was low risk & showed an inferolateral scar. & an EF of 39%  . Daily headache    "from the arthritis in back of neck" (05/21/2016)  . DVT (deep venous thrombosis) ([place]) 09/2010   "after knee replacement"  . GERD (gastroesophageal reflux disease)   . H/O mitral valve disorder   . History of hiatal hernia   . History of kidney stones   . History of stomach ulcers   . Hyperlipidemia   . Hypertension   .  Meningioma ([place]) 08/15/12   BrainLAB-guided left frontoparietal craniotomy w/removal of a meningioma.   . Obstructive sleep apnea    Sleep Study in 2011 AHI 3.2. "occasionally wear my mask" (05/21/2016)  . Paroxysmal atrial fibrillation (HCC)   . PSVT (paroxysmal supraventricular tachycardia) ([place])    2D ECHO 08/27/12: EF = 45-50% (Previous Echo revealed EF = 25%)  . Pulmonary embolism ([place]) 09/2010   "after knee replacement"  . Shortness of breath dyspnea   . Sick sinus syndrome ([place])   . TIA (transient ischemic  attack)     Patient Active Problem List   Diagnosis Date Noted  . 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 Seizure ([place]) 07/15/2018  . Seizures ([place])   . Acute blood loss anemia   . Chronic congestive heart failure ([place])   . TBI (traumatic brain injury) ([place]) 05/15/2018  . ICD (implantable cardioverter-defibrillator) in place 05/21/2016  . 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 Claudication of right lower extremity ([place]) 10/04/2015  . Preop cardiovascular exam 11/16/2014  . Palpitations 08/13/2013  . Chronic atrial fibrillation ([place]) 03/26/2013  . Hyperlipidemia 03/26/2013  . Barrett's esophagus 03/26/2013  . Obstructive sleep apnea 03/26/2013  . Cardiomyopathy, ischemic 12/19/2012  . HTN (hypertension) 12/19/2012  . Hx pulmonary embolism 12/19/2012  . NSVT (nonsustained ventricular tachycardia) ([place]) 10/23/2012  . CAD S/P multiple PCIs 10/23/2012  . PSVT (paroxysmal supraventricular tachycardia) ([place]) 09/30/2012  . Long term (current) use of anticoagulants 09/30/2012    Past Surgical History:  Procedure Laterality Date  . BASAL CELL CARCINOMA EXCISION     "head X 2; back, chest" (05/21/2016)  . CARDIAC CATHETERIZATION     Past caths in 2008, 2006, 2005 & 2003  . CATARACT EXTRACTION W/ INTRAOCULAR LENS  IMPLANT, BILATERAL Bilateral 2005  . CORONARY ANGIOPLASTY WITH STENT PLACEMENT  07/02/09   Most recent cath by Dr. Gwenlyn Found 07/02/09 was remarkable for patent stents w/an 80% in-stent stenosis within the obtuse marginal branch stent, as well as 60% ostial RCA stenosis which was stented with a Taxus Liberte drug-eluting stent.   . CORONARY ANGIOPLASTY WITH STENT PLACEMENT     "I have 12 stents in my heart" (05/21/2016)  . CRANIOTOMY  08/15/2012   At [facility]. BrainLAB-guided left frontoparietal craniotomy w/removal of a meningioma.   . EP IMPLANTABLE DEVICE N/A 05/21/2016   Procedure: ICD Implant;  Surgeon: Evans Lance, MD;  Location: [facility];  Service: Cardiovascular;  Laterality: N/A;  . INGUINAL HERNIA REPAIR Right 11/23/2014   Procedure: HERNIA REPAIR INGUINAL ADULT;  Surgeon: Rochel Brome, MD;  Location: [facility] ORS;  Service: General;  Laterality: Right;  . INGUINAL HERNIA REPAIR Bilateral 2010-2016  . JOINT REPLACEMENT    . THROAT SURGERY     "laser for Barret's esophagus"  . TONSILLECTOMY    . TOTAL KNEE ARTHROPLASTY Left 09/2010   Surgery was complicated by PAF with a pulmonary embolism documented by CT angiogram.    Prior to Admission medications   Medication Sig Start Date End Date Taking? Authorizing Provider  acetaminophen (TYLENOL) 500 MG tablet Take 500 mg by mouth every 6 (six) hours as needed (for pain.).    Yes [provider]  apixaban (ELIQUIS) 5 MG TABS tablet Take 1 tablet (5 mg total) by mouth 2 (two) times daily. 06/11/18  Yes Love, Ivan Anchors, PA-C  aspirin EC 81 MG tablet Take 81 mg by mouth daily.   Yes [provider]  docusate sodium (COLACE)  100 MG capsule Take 1 capsule (100 mg total) by mouth daily. 06/11/18  Yes Love, Ivan Anchors, PA-C  finasteride (PROSCAR) 5 MG tablet Take 1 tablet (5 mg total) by mouth at bedtime. 06/11/18  Yes Love, Ivan Anchors, PA-C  lacosamide (VIMPAT) 200 MG TABS tablet Take 1 tablet (200 mg total) by mouth 2 (two) times daily. 06/11/18  Yes Love, Ivan Anchors, PA-C  Melatonin 3 MG TABS Take 2 tablets (6 mg total) by mouth at bedtime. 06/11/18  Yes Love, Ivan Anchors, PA-C  metoprolol tartrate (LOPRESSOR) 50 MG tablet Take 50 mg by mouth 2 (two) times daily.    Yes [provider]  Multiple Vitamins-Minerals (MULTIVITAMIN ADULT) TABS Take 1 tablet by mouth daily. 04/03/18  Yes [provider]  pantoprazole (PROTONIX) 40 MG tablet Take 1 tablet (40 mg total) by mouth daily. 06/11/18  Yes Love, Ivan Anchors, PA-C  polyethylene glycol (MIRALAX / GLYCOLAX) packet Take 17 g by mouth daily. 06/11/18  Yes Love, Ivan Anchors, PA-C  polyvinyl alcohol  (LIQUIFILM TEARS) 1.4 % ophthalmic solution Place 1 drop into both eyes as needed for dry eyes.    Yes [provider]  QUEtiapine (SEROQUEL) 25 MG tablet Take 0.5 tablets (12.5 mg total) by mouth 2 (two) times daily. At 4 pm and 10 pm as well as prn. Patient taking differently: Take 12.5 mg by mouth See admin instructions. Take  tablet (12.5mg ) by mouth twice daily as scheduled (4PM and 10PM) 06/11/18  Yes Love, Ivan Anchors, PA-C  tamsulosin (FLOMAX) 0.4 MG CAPS capsule Take 1 capsule (0.4 mg total) by mouth daily after supper. 06/11/18  Yes Love, Ivan Anchors, PA-C  traZODone (DESYREL) 50 MG tablet Take 1 tablet (50 mg total) by mouth at bedtime. 06/11/18  Yes Love, Ivan Anchors, PA-C  valproic acid (DEPAKENE) 250 MG capsule Take 1 capsule (250 mg total) by mouth 2 (two) times daily for 26 days. 07/04/18 07/30/18 Yes Murray, Alyssa B, PA-C  vitamin B-12 (CYANOCOBALAMIN) 1000 MCG tablet Take 1,000 mcg by mouth daily.   Yes [provider]    Allergies Patient has no known allergies.  Family History  Problem Relation Age of Onset  . Cancer Mother   . Cardiomyopathy Sister   . Cancer Other        Breast  . Suicidality Father   . Anxiety disorder Father   . Schizophrenia Brother     Social History Social History   Tobacco Use  . Smoking status: Former Smoker    Packs/day: 1.00    Years: 35.00    Pack years: 35.00    Types: Cigarettes    Last attempt to quit: 1980    Years since quitting: 40.0  . Smokeless tobacco: Former Systems developer    Quit date: 1989  Substance Use Topics  . Alcohol use: Yes    Alcohol/week: 28.0 standard drinks    Types: 28 Cans of beer per week    Comment: 4 beers per day  . Drug use: No    Review of Systems  Constitutional: Negative for fever. Neurological: + seizure   ____________________________________________   PHYSICAL EXAM:  VITAL SIGNS: ED Triage Vitals  Enc Vitals Group     BP 07/25/18 0716 (!) 143/82     Pulse Rate 07/25/18 0716  72     Resp 07/25/18 0716 (!) 22     Temp --      Temp src --      SpO2 07/25/18 0716 98 %  Weight 07/25/18 0718 165 lb (74.8 kg)     Height 07/25/18 0718 6' (1.829 m)     Head Circumference --      Peak Flow --      Pain Score --      Pain Loc --      Pain Edu? --      Excl. in [place]? --     Constitutional: Post-ictal and eye gaze deviation to the right. HEENT:      Head: Normocephalic and atraumatic.         Eyes: Conjunctivae are normal. Sclera is non-icteric.       Mouth/Throat: Mucous membranes are moist.       Neck: Supple with no signs of meningismus. Cardiovascular: Regular rate and rhythm. No murmurs, gallops, or rubs. 2+ symmetrical distal pulses are present in all extremities. No JVD. Respiratory: Normal respiratory effort. Lungs are clear to auscultation bilaterally. No wheezes, crackles, or rhonchi.  Gastrointestinal: Soft, non tender, and non distended with positive bowel sounds. No rebound or guarding. Musculoskeletal:  No edema, cyanosis, or erythema of extremities. Neurologic: Post-ictal, gaze deviated to the R, opens eyes to noxious stimuli, withdrawals x 4 from noxious stimuli Skin: Skin is warm, dry and intact. No rash noted.   ____________________________________________   LABS (all labs ordered are listed, but only abnormal results are displayed)  Labs Reviewed  CBC - Abnormal; Notable for the following components:      Result Value   RBC 3.71 (*)    Hemoglobin 11.8 (*)    HCT 37.0 (*)    All other components within normal limits  BASIC METABOLIC PANEL - Abnormal; Notable for the following components:   Glucose, Bld 111 (*)    All other components within normal limits  VALPROIC ACID LEVEL - Abnormal; Notable for the following components:   Valproic Acid Lvl 31 (*)    All other components within normal limits  URINALYSIS, ROUTINE W REFLEX MICROSCOPIC - Abnormal; Notable for the following components:   Color, Urine YELLOW (*)    APPearance CLEAR  (*)    All other components within normal limits  PROTIME-INR  APTT  TROPONIN I   ____________________________________________  EKG  ED ECG REPORT I, Rudene Re, the attending physician, personally viewed and interpreted this ECG.  Normal sinus rhythm, rate of 74, occasional PVCs, normal QTC and QRS, borderline left axis deviation, diffuse T wave flattening in inferior and lateral leads.  No significant changes when compared to prior. ____________________________________________  RADIOLOGY  I have personally reviewed the images performed during this visit and I agree with the Radiologist's read.   Interpretation by Radiologist:  Ct Head Wo Contrast  Result Date: 07/25/2018 CLINICAL DATA:  Seizure activity of unknown duration EXAM: CT HEAD WITHOUT CONTRAST TECHNIQUE: Contiguous axial images were obtained from the base of the skull through the vertex without intravenous contrast. Sagittal and coronal MPR images reconstructed from axial data set. COMPARISON:  07/15/2018 FINDINGS: Brain: Generalized atrophy. Diffuse dilatation of the ventricular system for the degree of sulcal enlargement, question normal pressure hydrocephalus. No midline shift or mass effect. Old LEFT frontal infarct. Small old LEFT parietal and anterior LEFT temporal infarcts. No intracranial hemorrhage, mass lesion, or evidence of acute infarction. No extra-axial fluid collections. Vascular: Mild atherosclerotic calcifications within internal carotid and vertebral arteries at skull base Skull: Prior posterior LEFT parietal craniotomy. Skull otherwise intact. Sinuses/Orbits: Clear Other: N/A IMPRESSION: Diffuse ventricular dilatation question normal pressure hydrocephalus. 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 Postsurgical changes of posterior LEFT parietal craniotomy. No acute intracranial abnormalities. Electronically Signed   By: Lavonia Dana M.D.   On: 07/25/2018 08:26        ____________________________________________   PROCEDURES  Procedure(s) performed: None Procedures Critical Care performed:  None ____________________________________________   INITIAL IMPRESSION / ASSESSMENT AND PLAN / ED COURSE  78 y.o. 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 fib on Eliquis, cardiomyopathy status post AICD, pulmonary embolism, DVT, OSA, HTN, HLD who presents for evaluation of seizure.   Ddx seizure, intracranial hemorrhage, infection, electrolyte abnormalities.  Patient is currently post ictal with gaze deviation to the right.  Opens eyes and moves extremities to noxious stimuli.  According to son patient has become aphasic since his TBI.  Recently restarted on Depakote 10 days ago.  Due to gaze deviation and the fact the patient is on Eliquis we will send him for CT head to rule out intracranial hemorrhage.  Also check labs to rule out dehydration, infection, electrolyte abnormalities. Will monitor closely.   Clinical Course as of Jul 25 1038  Fri Jul 25, 2018  1007  Labs with no acute findings, UA negative for UTI.  Head CT with no intracranial hemorrhage.  Patient is fully back to baseline at this time. Patient monitored for 3 hours with no recurrent of the seizures.  Depakote level is low we will increase his dose to 500 mg at nighttime and remain 250 in the morning.  Patient has an appointment with his neurologist in 4 days.  Discussed return precautions with family.    [CV]    Clinical Course User Index [CV] Alfred Levins [place], MD     As part of my medical decision making, I reviewed the following data within the [place] History obtained from family, Nursing notes reviewed and incorporated, Labs reviewed , EKG interpreted , Old chart reviewed, Radiograph reviewed , Notes from prior ED visits and [place] Controlled Substance Database    Pertinent labs & imaging  results that were available during my care of the patient were reviewed by me and considered in my medical decision making (see chart for details).    ____________________________________________   FINAL CLINICAL IMPRESSION(S) / ED DIAGNOSES  Final diagnoses:  Seizure ([place])      NEW MEDICATIONS STARTED DURING THIS VISIT:  ED Discharge Orders    None       Note:  This document was prepared using Dragon voice recognition software and may include unintentional dictation errors.    Alfred Levins, [place], MD 07/25/18 1040

## 2018-07-25 NOTE — ED Notes (Addendum)
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 Son at bedside, states family had grand-mal seizure on New Years.  Son states pt has 24/7 NA and family rotates helping at home.  Son states the patient got up at 4am to watch TV and was told that the patient was on the toilet asleep.  Pt had been off Depakote and reports that he recently restarted on the depakote on New Years Day.    Pt's son states the patient is usually able to walk with assistance, son states the patient has some right leg weakness which is not new for him.  Pt does have some finger twitching in the right index finger.

## 2018-07-29 ENCOUNTER — Encounter: Payer: Medicare Other | Admitting: Speech Pathology

## 2018-07-29 ENCOUNTER — Ambulatory Visit: Payer: Medicare Other

## 2018-07-29 ENCOUNTER — Encounter: Payer: Medicare Other | Admitting: Occupational Therapy

## 2018-07-30 ENCOUNTER — Telehealth: Payer: Self-pay | Admitting: *Deleted

## 2018-07-30 NOTE — Telephone Encounter (Signed)
Patients wife left a message asking for Dr. Naaman Plummer to please provide a handwritten script for a commode and walker. They have purchased these items and are eligible for reimbursement from the [place] as long as a script can be provided.  Dr. Naaman Plummer provided the script. Script mailed to patient per fiancees request

## 2018-07-31 ENCOUNTER — Ambulatory Visit: Payer: Medicare Other | Admitting: Physical Therapy

## 2018-07-31 ENCOUNTER — Encounter: Payer: Medicare Other | Admitting: Speech Pathology

## 2018-08-03 LAB — CUP PACEART REMOTE DEVICE CHECK
Battery Remaining Longevity: 126 mo
Battery Remaining Percentage: 100 %
Brady Statistic RA Percent Paced: 0 %
Date Time Interrogation Session: 20191217171800
HighPow Impedance: 56 Ohm
Implantable Lead Implant Date: 20171106
Implantable Lead Implant Date: 20171106
Implantable Lead Location: 753859
Implantable Lead Location: 753860
Implantable Lead Model: 293
Implantable Lead Model: 7741
Implantable Lead Serial Number: 422759
Implantable Lead Serial Number: 828526
Implantable Pulse Generator Implant Date: 20171106
Lead Channel Impedance Value: 631 Ohm
Lead Channel Pacing Threshold Amplitude: 0.8 V
Lead Channel Pacing Threshold Pulse Width: 0.4 ms
Lead Channel Setting Pacing Amplitude: 2 V
Lead Channel Setting Pacing Amplitude: 2.5 V
Lead Channel Setting Pacing Pulse Width: 0.4 ms
Lead Channel Setting Sensing Sensitivity: 0.5 mV
MDC IDC MSMT LEADCHNL RV IMPEDANCE VALUE: 449 Ohm
MDC IDC STAT BRADY RV PERCENT PACED: 1 %
Pulse Gen Serial Number: 519130

## 2018-08-04 ENCOUNTER — Telehealth: Payer: Self-pay | Admitting: Cardiovascular Disease

## 2018-08-04 NOTE — Telephone Encounter (Signed)
New Message:    Pt is in [facility] and they need to know if Dr Gwenlyn Found is the doctor who started him on his blood  thinner? If so,they need to know if it is necessary for pt to stay on his blood thinner.Pt is now on Eliquis.

## 2018-08-04 NOTE — Telephone Encounter (Signed)
Returned call to patient's girl friend [name].She stated she wanted Dr.Berry to know patient is currently at [facility] with 3 brain bleeds.Stated neurologist Dr.Freeman has took him off Eliquis.He will be having a permanent shunt put in.Advised I will send message to Dr.Berry.

## 2018-08-05 ENCOUNTER — Encounter: Payer: Medicare Other | Admitting: Speech Pathology

## 2018-08-05 ENCOUNTER — Encounter: Payer: Medicare Other | Admitting: Occupational Therapy

## 2018-08-07 ENCOUNTER — Ambulatory Visit: Payer: Medicare Other | Admitting: Physical Therapy

## 2018-08-07 ENCOUNTER — Encounter: Payer: Medicare Other | Admitting: Speech Pathology

## 2018-08-13 ENCOUNTER — Ambulatory Visit: Payer: Medicare Other

## 2018-08-13 ENCOUNTER — Encounter: Payer: Medicare Other | Admitting: Occupational Therapy

## 2018-08-15 ENCOUNTER — Ambulatory Visit: Payer: Medicare Other

## 2018-08-15 ENCOUNTER — Encounter: Payer: Medicare Other | Admitting: Speech Pathology

## 2018-08-18 ENCOUNTER — Ambulatory Visit: Payer: Medicare Other | Admitting: Diagnostic Neuroimaging

## 2018-09-30 ENCOUNTER — Other Ambulatory Visit: Payer: Self-pay

## 2018-09-30 ENCOUNTER — Ambulatory Visit (INDEPENDENT_AMBULATORY_CARE_PROVIDER_SITE_OTHER): Payer: Medicare Other | Admitting: *Deleted

## 2018-09-30 DIAGNOSIS — I255 Ischemic cardiomyopathy: Secondary | ICD-10-CM

## 2018-10-01 LAB — CUP PACEART REMOTE DEVICE CHECK
Battery Remaining Longevity: 120 mo
Battery Remaining Percentage: 100 %
Brady Statistic RA Percent Paced: 0 %
Date Time Interrogation Session: 20200317090100
HighPow Impedance: 64 Ohm
Implantable Lead Implant Date: 20171106
Implantable Lead Implant Date: 20171106
Implantable Lead Location: 753859
Implantable Lead Location: 753860
Implantable Lead Model: 293
Implantable Lead Model: 7741
Implantable Lead Serial Number: 422759
Implantable Lead Serial Number: 828526
Implantable Pulse Generator Implant Date: 20171106
Lead Channel Impedance Value: 490 Ohm
Lead Channel Impedance Value: 671 Ohm
Lead Channel Pacing Threshold Amplitude: 0.8 V
Lead Channel Pacing Threshold Pulse Width: 0.4 ms
Lead Channel Setting Pacing Amplitude: 2 V
Lead Channel Setting Pacing Pulse Width: 0.4 ms
Lead Channel Setting Sensing Sensitivity: 0.5 mV
MDC IDC SET LEADCHNL RV PACING AMPLITUDE: 2.5 V
MDC IDC STAT BRADY RV PERCENT PACED: 1 %
Pulse Gen Serial Number: 519130

## 2018-10-08 ENCOUNTER — Encounter: Payer: Self-pay | Admitting: Cardiology

## 2018-10-08 NOTE — Progress Notes (Signed)
Remote ICD transmission.   

## 2018-12-04 ENCOUNTER — Telehealth: Payer: Self-pay | Admitting: Cardiovascular Disease

## 2018-12-04 NOTE — Telephone Encounter (Signed)
I would recommend staying on the Eliquis

## 2018-12-04 NOTE — Telephone Encounter (Signed)
Spoke to patient 's fiance [name]( dpi) . She wanted to know what Dr Kennon Holter Opinion.  Per [name] , patient had a brain injury last Aug 2019 an has a shunt- patient has an appointment with neurologist tomorrow.  She calling because patient is seeing a primay at the [place]. The primary has suggested for the patient to stop taking Eliquis due to increase risk. She states  The patient is on the medication for congestive heart failure - she wanted the opinion from Dr Reather Laurence the  [name] family can make a decision.  RN educated [name] that the Eliquis  That the patient was taking due to him being atrial fibrillation and hx pulmonary embolism  Patient has an routine visit with Dr Gwenlyn Found for July 2020.  [name] is aware will defer to Dr Gwenlyn Found and await  For  response  Please call her at [phone] [name]

## 2018-12-04 NOTE — Telephone Encounter (Signed)
New message   [name] fiance has questions about if the patient should still be taken off of  coumadin because the patient has congestive heart failure. Please call to discuss.

## 2018-12-11 NOTE — Telephone Encounter (Signed)
dpr on file. Informed pt fiance janice butler that Dr. Gwenlyn Found recommends that pt stay on Eliquis. She verbalized understanding

## 2018-12-15 ENCOUNTER — Telehealth: Payer: Self-pay | Admitting: Cardiovascular Disease

## 2018-12-15 NOTE — Telephone Encounter (Signed)
Returned call to patient's fiancee. He had seizure on Saturday. Home health nurse checked pulse ox yesterday and his O2 was 85% and now 100%. She reports his breathing is labored and he is breathing thru his mouth. She reports he was wheezing a lot during this seizure and this was different during last seizure. She is concerned d/t cardiac history, stenting.   He has 3 healing brain bleeds so communication is limited. She is unable to determine if he has any other symptoms.   Advised would route to Dr. Gwenlyn Found & Chima RN to review and advise

## 2018-12-15 NOTE — Telephone Encounter (Signed)
Hard to tell by description but I would focus on the Sz issue as opposed to cardiac

## 2018-12-15 NOTE — Telephone Encounter (Signed)
Mr Shevchenko's Celesta Gentile [name] is calling because patient had a seizure on Saturday for about a minute. During the seizure he started having issue breathing. Since the seizure she states that he is having labored breathing and his O2 level yesterday went down as low as 85. She is very concerned about the labored breathing and would like to see if it would be possible for him to be seen in the office by Dr Gwenlyn Found. Please call

## 2018-12-17 NOTE — Telephone Encounter (Signed)
Spoke with pt dtr, aware of dr berry's recommendations. She reports the SOB is about the same but the patient has started rubbing the left side of his chest which is what he does when he has chest pain. She would like to talk to someone. Virtual telephone visit scheduled with the pa tomorrow.

## 2018-12-18 ENCOUNTER — Encounter: Payer: Self-pay | Admitting: Physician Assistant

## 2018-12-18 ENCOUNTER — Telehealth: Payer: Self-pay

## 2018-12-18 ENCOUNTER — Telehealth (INDEPENDENT_AMBULATORY_CARE_PROVIDER_SITE_OTHER): Payer: Medicare Other | Admitting: Physician Assistant

## 2018-12-18 VITALS — BP 124/94 | HR 78 | Temp 96.5°F | Ht 71.0 in

## 2018-12-18 DIAGNOSIS — R0602 Shortness of breath: Secondary | ICD-10-CM

## 2018-12-18 DIAGNOSIS — E785 Hyperlipidemia, unspecified: Secondary | ICD-10-CM

## 2018-12-18 DIAGNOSIS — Z95810 Presence of automatic (implantable) cardiac defibrillator: Secondary | ICD-10-CM

## 2018-12-18 DIAGNOSIS — I4819 Other persistent atrial fibrillation: Secondary | ICD-10-CM

## 2018-12-18 DIAGNOSIS — I2699 Other pulmonary embolism without acute cor pulmonale: Secondary | ICD-10-CM

## 2018-12-18 DIAGNOSIS — I25119 Atherosclerotic heart disease of native coronary artery with unspecified angina pectoris: Secondary | ICD-10-CM

## 2018-12-18 DIAGNOSIS — I629 Nontraumatic intracranial hemorrhage, unspecified: Secondary | ICD-10-CM

## 2018-12-18 DIAGNOSIS — I10 Essential (primary) hypertension: Secondary | ICD-10-CM

## 2018-12-18 DIAGNOSIS — R569 Unspecified convulsions: Secondary | ICD-10-CM

## 2018-12-18 NOTE — Telephone Encounter (Signed)
[name] Visit Initial Request  Agency Requested:    [facility] Contact:  Glory Buff, NP [place], [place] [address] Phone #:  [phone] Fax #:  [phone]  Patient Demographic Information: Name:  [name] Age:  78 y.o.   DOB:  11-Jun-1941  MRN:  [id number]   Address:   [name] [address]   Phone Numbers:   Home Phone [phone]  Mobile [phone]     Emergency Contact Information on File:   Contact Information    Name Relation Home Work Mobile   [name] Significant other   [phone]   [name], [name] Daughter   [phone]   [name], [name]   [phone]      The above family members may be contacted for information on this patient (review DPR on file):  Yes    Patient Clinical Information:  Primary Care Provider:  Kirk Ruths, MD  Primary Cardiologist:  Ricky Burow, MD  Primary Electrophysiologist:  None   Requesting Provider:  Almyra Deforest, PA-C   Past Medical Hx: [name]  has a past medical history of A-fib [facility]), AICD (automatic cardioverter/defibrillator) present (05/21/2016), Arthritis, Barrett's esophagus, Basal cell carcinoma, Blurred vision, CAD (coronary artery disease), Cardiomyopathy ([place]), Carotid artery occlusion, Cerebral atherosclerosis, CHF (congestive heart failure) ([place]), Chronic anticoagulation, Claudication [facility]), Coronary artery disease, Daily headache, DVT (deep venous thrombosis) ([place]) (09/2010), GERD (gastroesophageal reflux disease), H/O mitral valve disorder, History of hiatal hernia, History of kidney stones, History of stomach ulcers, Hyperlipidemia, Hypertension, Meningioma ([place]) (08/15/12), Obstructive sleep apnea, Paroxysmal atrial fibrillation ([place]), PSVT (paroxysmal supraventricular tachycardia) ([place]), Pulmonary embolism ([place]) (09/2010), Shortness of breath dyspnea, Sick sinus syndrome ([place]), and TIA (transient ischemic  attack).   Allergies: He has No Known Allergies.   Medications: Current Outpatient Medications on File Prior to Visit  Medication Sig  . acetaminophen (TYLENOL) 500 MG tablet Take 500 mg by mouth every 6 (six) hours as needed (for pain.).   [name] amantadine (SYMMETREL) 100 MG capsule Take by mouth.  [name] apixaban (ELIQUIS) 5 MG TABS tablet Take 1 tablet (5 mg total) by mouth 2 (two) times daily.  [name] aspirin EC 81 MG tablet Take 81 mg by mouth daily.  . finasteride (PROSCAR) 5 MG tablet Take 1 tablet (5 mg total) by mouth at bedtime.  [name] lacosamide (VIMPAT) 200 MG TABS tablet Take 1 tablet (200 mg total) by mouth 2 (two) times daily.  . Melatonin 3 MG TABS Take 2 tablets (6 mg total) by mouth at bedtime.  . metoprolol tartrate (LOPRESSOR) 50 MG tablet Take 50 mg by mouth 2 (two) times daily.   . Multiple Vitamins-Minerals (MULTIVITAMIN ADULT) TABS Take 1 tablet by mouth daily.  . pantoprazole (PROTONIX) 40 MG tablet Take 1 tablet (40 mg total) by mouth daily. (Patient taking differently: Take 40 mg by mouth daily. Take I tablet in the morning and 1 tablet at 4pm.)  . phenytoin (DILANTIN) 100 MG ER capsule Take by mouth daily. Take 2 tablets at bedtime.  . polyethylene glycol (MIRALAX / GLYCOLAX) packet Take 17 g by mouth daily.  . polyvinyl alcohol (LIQUIFILM TEARS) 1.4 % ophthalmic solution Place 1 drop into both eyes as needed for dry eyes.   [name] QUEtiapine (SEROQUEL) 25 MG tablet Take 0.5 tablets (12.5 mg total) by mouth 2 (two) times daily. At 4 pm and 10 pm as well as prn. (Patient taking differently: Take 12.5 mg by mouth See admin instructions.  Take  tablet (12.5mg ) by mouth twice daily as scheduled (4PM and 10PM))  . senna-docusate (SENOKOT-S) 8.6-50 MG tablet Take by mouth.  . tamsulosin (FLOMAX) 0.4 MG CAPS capsule Take 1 capsule (0.4 mg total) by mouth daily after supper.  . traZODone (DESYREL) 50 MG tablet Take 1 tablet (50 mg total) by mouth at bedtime.  . valproic acid (DEPAKENE) 250 MG  capsule 500mg  at bedtime and 250mg  in the morning (Patient taking differently: 750mg  at bedtime and 750mg  in the morning)  . vitamin B-12 (CYANOCOBALAMIN) 1000 MCG tablet Take 1,000 mcg by mouth daily.   No current facility-administered medications on file prior to visit.      Social Hx: He  reports that he quit smoking about 40 years ago. His smoking use included cigarettes. He has a 35.00 pack-year smoking history. He quit smokeless tobacco use about 31 years ago. He reports current alcohol use of about 28.0 standard drinks of alcohol per week. He reports that he does not use drugs.    Diagnosis/Reason for Visit:   Chest pain and SOB  Services Requested:  Vital Signs (BP, Pulse, O2, Weight)  Physical Exam  Rhythm Strip (AliveCor Device)  Labs:  BMET, CBC  # of Visits Needed/Frequency per Week: 1  YES A copy of the office note will be faxed with this form. All labs ordered for this home visit have been released.

## 2018-12-18 NOTE — Progress Notes (Signed)
Virtual Visit via Telephone Note   This visit type was conducted due to national recommendations for restrictions regarding the COVID-19 Pandemic (e.g. social distancing) in an effort to limit this patient's exposure and mitigate transmission in our community.  Due to his co-morbid illnesses, this patient is at least at moderate risk for complications without adequate follow up.  This format is felt to be most appropriate for this patient at this time.  The patient did not have access to video technology/had technical difficulties with video requiring transitioning to audio format only (telephone).  All issues noted in this document were discussed and addressed.  No physical exam could be performed with this format.  Please refer to the patient's chart for his  consent to telehealth for [facility].   Date:  12/18/2018   ID:  [name], DOB 11-06-1940, MRN [id number]  Patient Location: Home Provider Location: Home  PCP:  Ricky Ruths, MD  Cardiologist:  Quay Burow, MD  Electrophysiologist:  None Dr. Lovena Le  Evaluation Performed:  Follow-Up Visit  Chief Complaint:  Followup/recent seizure and SOB  History of Present Illness:    [name] is a 78 y.o. 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  He had history of stenting to his LAD, left circumflex and left marginal branch as well as RCA.  Last cardiac catheterization in December 2010 showed patent stents with 80% in-stent restenosis in the obtuse marginal branch and a 60% ostial RCA stenosis which were treated with DES.  He has a history of PE that occurred after knee replacement.  He was  placed on Coumadin at the time.  Last Myoview obtained on 09/03/2012 showed EF 46%, basal diaphragmatic attenuation versus regional basal nontransmural inferior scar was noted.  Echocardiogram also showed a drop in EF as well.  He had a heart monitor that show evidence of PAF as well as nonsustained VT up to 26 beats run which he was asymptomatic.  Due to persistent low EF of 30% on repeat echocardiogram in August 2017, he underwent ICD implantation by Dr. Lovena Le on 05/21/2016.  Patient was last seen by Dr. Gwenlyn Found on 01/15/2018, he denied any chest pain at the time however does complain of some increasing shortness of breath.   He was admitted at [facility] in August 2019 after being found down in the basement of his house.  Subsequent evaluation revealed subarachnoid hemorrhage, subdural hemorrhage, and a cerebral contusion.  He was given rapid reversal agent for Pradaxa and transferred to [facility].  He had a prolonged course in the hospital from 8/24- 04/03/2018.  He was later discharged to rehab with a PEG tube.  He was later weaned off PEG tube and is discharged home on rehab.  According to his family member, he made significant improvement when he started and that it was able to walk and talk and communicate at some point.  Unfortunately he had further decline in his mental status when he was readmitted in January 2020 due to inability to talk, decreased speech and urinary incontinence.  He was admitted for lumbar drain trial during which a right ventriculoperitoneal shunt was placed.   We will plan to family member, to this day, his functional ability is quite  limited.  He is still receiving physical therapy regularly and is able to get up only with assistance.  He communicate on some days and does not communicate all other days.  Based on recent telephone note, patient had a seizure this past Saturday.  The previous seizure was back in April.  He was agitated and also has some wheezing at the time.  Symptoms  eventually settled down on Saturday morning however did not completely go away until least well into Sunday. O2 saturation was 85% of the time.  O2 saturation only seems to be improved with the patient sitting up.  Family noticed some ankle edema, however this ankle edema improved with leg elevation.  At this time, it is difficult for me to tell over the phone whether the patient had wheezing because of possible aspiration during the seizure versus heart failure.  The fact lower extremity edema goes down with leg elevation make heart failure less likely.  Family also says he has been rubbing his chest which is usually what he does when he has chest pain.  Although patient did not specifically say he had any chest pain.  I will arrange for home health visit with Pearletha Furl for physical exam to check volume status and listen to his lung and also obtain EKG to check for rhythm.  Ideally a 12-lead EKG should be done as well  The patient does not have symptoms concerning for COVID-19 infection (fever, chills, cough, or new shortness of breath).    Past Medical History:  Diagnosis Date   A-fib [facility])    AICD (automatic cardioverter/defibrillator) present 05/21/2016   Arthritis    "back of my neck extending to back of head" (05/21/2016)   Barrett's esophagus    Gets periodic endoscopies & biopsies. Coumadin has been held in the past for these precedures.    Basal cell carcinoma    "scalp; chest; back"   Blurred vision    CAD (coronary artery disease)    S/P stenting of his LAD, circumflex & marginal branch as well as his RCA in 2008.   Cardiomyopathy ([place])    Significant ischemic cardiomyopathy. ECHO 08/27/12: EF = 45-50% (Previous Echo revealed EF = 25%)   Carotid artery occlusion    Carotid Doppler 08/27/12 SUMMARY - Bilateral ICAs: Demonstrated normal patency without evidence of a significant diamenter reduction, tortuosity or any other vascular abnormality. This was a Normal carotid  duplex Doppler Evaluation.   Cerebral atherosclerosis    Carotid Doppler 08/27/12 SUMMARY - Bilateral ICAs: Demonstrated normal patency without evidence of a significant diamenter reduction, tortuosity or any other vascular abnormality. This was a Normal carotid duplex Doppler Evaluation.   CHF (congestive heart failure) (HCC)    Chronic anticoagulation    For PE & PAF   Claudication (HCC)    right ABI of 0.84   Coronary artery disease    Most recent cath by Dr. Gwenlyn Found 07/02/09 was remarkable for patent stents w/an 80% in-stent stenosis within the obtuse marginal branch stent, as well as 60% ostial RCA stenosis which was stented with a Taxus Liberte drug-eluting stent. Nuc stress test 09/03/12 was low risk & showed an inferolateral scar. & an EF of 39%   Daily headache    "from the arthritis in back of neck" (05/21/2016)   DVT (deep venous thrombosis) ([place]) 09/2010   "after knee replacement"   GERD (gastroesophageal reflux disease)    H/O mitral valve disorder    History of hiatal hernia  History of kidney stones    History of stomach ulcers    Hyperlipidemia    Hypertension    Meningioma ([place]) 08/15/12   BrainLAB-guided left frontoparietal craniotomy w/removal of a meningioma.    Obstructive sleep apnea    Sleep Study in 2011 AHI 3.2. "occasionally wear my mask" (05/21/2016)   Paroxysmal atrial fibrillation (HCC)    PSVT (paroxysmal supraventricular tachycardia) ([place])    2D ECHO 08/27/12: EF = 45-50% (Previous Echo revealed EF = 25%)   Pulmonary embolism ([place]) 09/2010   "after knee replacement"   Shortness of breath dyspnea    Sick sinus syndrome ([place])    TIA (transient ischemic attack)    Past Surgical History:  Procedure Laterality Date   BASAL CELL CARCINOMA EXCISION     "head X 2; back, chest" (05/21/2016)   CARDIAC CATHETERIZATION     Past caths in 2008, 2006, 2005 & 2003   CATARACT EXTRACTION W/ INTRAOCULAR LENS  IMPLANT, BILATERAL Bilateral 2005    CORONARY ANGIOPLASTY WITH STENT PLACEMENT  07/02/09   Most recent cath by Dr. Gwenlyn Found 07/02/09 was remarkable for patent stents w/an 80% in-stent stenosis within the obtuse marginal branch stent, as well as 60% ostial RCA stenosis which was stented with a Taxus Liberte drug-eluting stent.    CORONARY ANGIOPLASTY WITH STENT PLACEMENT     "I have 12 stents in my heart" (05/21/2016)   CRANIOTOMY  08/15/2012   At [place]. BrainLAB-guided left frontoparietal craniotomy w/removal of a meningioma.    EP IMPLANTABLE DEVICE N/A 05/21/2016   Procedure: ICD Implant;  Surgeon: Evans Lance, MD;  Location: [facility];  Service: Cardiovascular;  Laterality: N/A;   INGUINAL HERNIA REPAIR Right 11/23/2014   Procedure: HERNIA REPAIR INGUINAL ADULT;  Surgeon: Rochel Brome, MD;  Location: [facility]S;  Service: General;  Laterality: Right;   INGUINAL HERNIA REPAIR Bilateral 2010-2016   JOINT REPLACEMENT     THROAT SURGERY     "laser for Barret's esophagus"   TONSILLECTOMY     TOTAL KNEE ARTHROPLASTY Left 09/2010   Surgery was complicated by PAF with a pulmonary embolism documented by CT angiogram.     Current Meds  Medication Sig   acetaminophen (TYLENOL) 500 MG tablet Take 500 mg by mouth every 6 (six) hours as needed (for pain.).    amantadine (SYMMETREL) 100 MG capsule Take by mouth.   apixaban (ELIQUIS) 5 MG TABS tablet Take 1 tablet (5 mg total) by mouth 2 (two) times daily.   aspirin EC 81 MG tablet Take 81 mg by mouth daily.   finasteride (PROSCAR) 5 MG tablet Take 1 tablet (5 mg total) by mouth at bedtime.   lacosamide (VIMPAT) 200 MG TABS tablet Take 1 tablet (200 mg total) by mouth 2 (two) times daily.   Melatonin 3 MG TABS Take 2 tablets (6 mg total) by mouth at bedtime.   metoprolol tartrate (LOPRESSOR) 50 MG tablet Take 50 mg by mouth 2 (two) times daily.    Multiple Vitamins-Minerals (MULTIVITAMIN ADULT) TABS Take 1 tablet by mouth daily.   pantoprazole (PROTONIX) 40 MG  tablet Take 1 tablet (40 mg total) by mouth daily. (Patient taking differently: Take 40 mg by mouth daily. Take I tablet in the morning and 1 tablet at 4pm.)   phenytoin (DILANTIN) 100 MG ER capsule Take by mouth daily. Take 2 tablets at bedtime.   polyethylene glycol (MIRALAX / GLYCOLAX) packet Take 17 g by mouth daily.   polyvinyl alcohol (LIQUIFILM TEARS) 1.4 %  ophthalmic solution Place 1 drop into both eyes as needed for dry eyes.    QUEtiapine (SEROQUEL) 25 MG tablet Take 0.5 tablets (12.5 mg total) by mouth 2 (two) times daily. At 4 pm and 10 pm as well as prn. (Patient taking differently: Take 12.5 mg by mouth See admin instructions. Take  tablet (12.5mg ) by mouth twice daily as scheduled (4PM and 10PM))   senna-docusate (SENOKOT-S) 8.6-50 MG tablet Take by mouth.   tamsulosin (FLOMAX) 0.4 MG CAPS capsule Take 1 capsule (0.4 mg total) by mouth daily after supper.   traZODone (DESYREL) 50 MG tablet Take 1 tablet (50 mg total) by mouth at bedtime.   valproic acid (DEPAKENE) 250 MG capsule 500mg  at bedtime and 250mg  in the morning (Patient taking differently: 750mg  at bedtime and 750mg  in the morning)   vitamin B-12 (CYANOCOBALAMIN) 1000 MCG tablet Take 1,000 mcg by mouth daily.     Allergies:   Patient has no known allergies.   Social History   Tobacco Use   Smoking status: Former Smoker    Packs/day: 1.00    Years: 35.00    Pack years: 35.00    Types: Cigarettes    Last attempt to quit: 1980    Years since quitting: 40.4   Smokeless tobacco: Former Systems developer    Quit date: 1989  Substance Use Topics   Alcohol use: Yes    Alcohol/week: 28.0 standard drinks    Types: 28 Cans of beer per week    Comment: 4 beers per day   Drug use: No     Family Hx: The patient's family history includes Anxiety disorder in his father; Cancer in his mother and another family member; Cardiomyopathy in his sister; Schizophrenia in his brother; Suicidality in his father.  ROS:   Please  see the history of present illness.     All other systems reviewed and are negative.   Prior CV studies:   The following studies were reviewed today:  Echo 01/17/2018 LV EF: 35% -   40% Study Conclusions  - Left ventricle: Septal, mid and basal inferior wall hypokinesis   The cavity size was moderately dilated. Wall thickness was   increased in a pattern of moderate LVH. Systolic function was   moderately reduced. The estimated ejection fraction was in the   range of 35% to 40%. - Mitral valve: Severely calcified annulus. - Left atrium: The atrium was moderately dilated. - Right atrium: The atrium was severely dilated. - Atrial septum: No defect or patent foramen ovale was identified. - Tricuspid valve: There was moderate regurgitation. - Pulmonary arteries: PA peak pressure: 36 mm Hg (S).   Labs/Other Tests and Data Reviewed:    EKG:  An ECG dated 07/25/2018 was personally reviewed today and demonstrated:  Atrial fibrillation  Recent Labs: 07/15/2018: ALT 14 07/25/2018: BUN 14; Creatinine, Ser 0.74; Hemoglobin 11.8; Platelets 203; Potassium 3.9; Sodium 140   Recent Lipid Panel Lab Results  Component Value Date/Time   CHOL 154 04/01/2014 08:14 AM   TRIG 70 04/01/2014 08:14 AM   HDL 69 04/01/2014 08:14 AM   CHOLHDL 2.2 04/01/2014 08:14 AM   LDLCALC 71 04/01/2014 08:14 AM    Wt Readings from Last 3 Encounters:  07/25/18 165 lb (74.8 kg)  07/21/18 165 lb (74.8 kg)  07/15/18 156 lb 12 oz (71.1 kg)     Objective:    Vital Signs:  BP (!) 124/94    Pulse 78    Temp (!) 96.5 F (35.8  C)    Ht 5\' 11"  (1.803 m)    SpO2 97%    BMI 23.01 kg/m    VITAL SIGNS:  reviewed  ASSESSMENT & PLAN:    1. Dyspnea: Although he does have occasional ankle edema: This quickly resolves with leg elevation.  He had an episode of seizure this past Saturday and noted to have low O2 saturation afterward.  His O2 saturation improved after he set up.  At this point, it is unclear to me over the  telephone whether the patient had potential aspiration during the seizure versus heart failure.  He will need a home health visit to check his lung. Obtain Pulse ox. CBC and BMET  2. Persistent atrial fibrillation: On Eliquis and metoprolol.  He likely remains in atrial fibrillation and will need anticoagulation therapy.  3. History of brain bleed: He has both subdural hemorrhage and subarachnoid hemorrhage that was noted in August 2019.  Unfortunately combined this with his procedure and previous craniotomy, his mental status and functional ability is quite limited at this time.  He does communicate sometimes.  This is being followed by neurology service.  4. CAD: Patient apparently was rubbing his chest after the seizure episode this past weekend.  I suspected this is probably more related to seizure and shortness of breath and true cardiac issue.  He did not come medicate whether or not he had any chest pain.  Continue aspirin.  He will not be a candidate for any stress testing or cardiac catheterization given his overall condition at this time.  5. History of recurrent seizure: On valproic acid at home.  Most recent recurrent seizure happened this past Saturday.  6. Ischemic cardiomyopathy s/p ICD: EF 35% based on last echocardiogram in 2019.  7. Hypertension: Blood pressure well controlled on current therapy  8. Hyperlipidemia: He is not on any statin at this point.  This will need to be readdressed on follow-up  9. History of PE: Occurred after orthopedic surgery in 2011.  No recurrence.  COVID-19 Education: The signs and symptoms of COVID-19 were discussed with the patient and how to seek care for testing (follow up with PCP or arrange E-visit).  The importance of social distancing was discussed today.  Time:   Today, I have spent 17 minutes with the patient with telehealth technology discussing the above problems.     Medication Adjustments/Labs and Tests Ordered: Current medicines  are reviewed at length with the patient today.  Concerns regarding medicines are outlined above.   Tests Ordered: No orders of the defined types were placed in this encounter.   Medication Changes: No orders of the defined types were placed in this encounter.   Disposition:  Follow up in 3 week(s)  Signed, Almyra Deforest, PA  12/18/2018 12:00 PM    [facility]

## 2018-12-18 NOTE — Telephone Encounter (Signed)
Routed via [employer]

## 2018-12-18 NOTE — Patient Instructions (Addendum)
Medication Instructions:   Your physician recommends that you continue on your current medications as directed. Please refer to the Current Medication list given to you today.   If you need a refill on your cardiac medications before your next appointment, please call your pharmacy.   Lab work: You will need to have labs (blood work) drawn:   BMET CBC  If you have labs (blood work) drawn today and your tests are completely normal, you will receive your results only by: [name] MyChart Message (if you have MyChart) OR . A paper copy in the mail If you have any lab test that is abnormal or we need to change your treatment, we will call you to review the results.  Testing/Procedures:  NONE ordered at this time of appointment   Follow-Up: At [facility], you and your health needs are our priority.  As part of our continuing mission to provide you with exceptional heart care, we have created designated Provider Care Teams.  These Care Teams include your primary Cardiologist (physician) and Advanced Practice Providers (APPs -  Physician Assistants and Nurse Practitioners) who all work together to provide you with the care you need, when you need it. . Your physician recommends that you schedule a follow-up Virtual appointment in 3-4 weeks with Quay Burow, MD   Any Other Special Instructions Will Be Listed Below (If Applicable).

## 2018-12-18 NOTE — Telephone Encounter (Signed)
Routed labs via [employer]

## 2018-12-20 NOTE — Progress Notes (Signed)
[name]       DOB: 25-Aug-1940  Purpose of Visit: Assessment, VS, EKG, BMET, CBC  Cardiology provider: Almyra Deforest, PA  Medications: Is the patient taking all medications listed on MAR from Epic? Yes List any medications that are not being taken correctly: No  List any medication refills needed: none  Is the patient able to pick up medications? No, however he has family that is able.  Vitals: BP:  118/82   HR: 68   Oxygen:  97% Weight:  Unable to obtain        Physical Exam:  Lung sounds: clear  Heart sounds: irregular  Peripheral edema: no  Wounds: no  Location:  Any patient concerns?   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  On arrival, [name] was just helped back into his hospital bed which is in the living room.  It was explained that he has 24/7 care with an aide and a family member who rotates with other family members.  [name] son, aide, and S.O. were at the house with him and assisted in answering questions.  [name] answered a few simple questions appropriately w/yes or no.  He did for a 3 or 4 [name] sentence which his S.O said is more than he usually says at one time.   [name] needs full assist to transfer OOB to walker or WC and needs assist to ambulate w/walker.  He can eat w/minimal assist and tolerates a regular diet w/thin liquids.  He is mostly incontinent of urine and stool, but can sit on BSC to eliminate if he can express the need to go and be assisted onto it in time.   BMET and CBC drawn and brought to the [facility] in [place].   ReDS Vest/Clip Reading: n/a  Rhythm Strip: unable to obtain x 3 tries.  He is unable to hold fingers on the Kardia device for the entire time and has tremors that create artifact.  Is Home Health recommended? He has [facility] already If yes, state reason:   Vanita Ingles, RN 12/20/18

## 2018-12-25 LAB — SPECIMEN STATUS REPORT

## 2018-12-25 LAB — BASIC METABOLIC PANEL WITH GFR
BUN/Creatinine Ratio: 12 (ref 10–24)
BUN: 13 mg/dL (ref 8–27)
Calcium: 8.8 mg/dL (ref 8.6–10.2)
Creatinine, Ser: 1.06 mg/dL (ref 0.76–1.27)
GFR calc Af Amer: 76 mL/min/1.73
GFR calc non Af Amer: 66 mL/min/1.73

## 2018-12-25 LAB — CBC
Hematocrit: 36.5 % — ABNORMAL LOW (ref 37.5–51.0)
Hemoglobin: 12.4 g/dL — ABNORMAL LOW (ref 13.0–17.7)
MCH: 32.6 pg (ref 26.6–33.0)
MCHC: 34 g/dL (ref 31.5–35.7)
MCV: 96 fL (ref 79–97)
Platelets: 128 x10E3/uL — ABNORMAL LOW (ref 150–450)
RBC: 3.8 x10E6/uL — ABNORMAL LOW (ref 4.14–5.80)
RDW: 14.3 % (ref 11.6–15.4)
WBC: 5.9 x10E3/uL (ref 3.4–10.8)

## 2018-12-30 ENCOUNTER — Telehealth: Payer: Self-pay | Admitting: Cardiovascular Disease

## 2018-12-30 ENCOUNTER — Ambulatory Visit (INDEPENDENT_AMBULATORY_CARE_PROVIDER_SITE_OTHER): Payer: Medicare Other | Admitting: *Deleted

## 2018-12-30 DIAGNOSIS — I255 Ischemic cardiomyopathy: Secondary | ICD-10-CM

## 2018-12-30 LAB — CUP PACEART REMOTE DEVICE CHECK
Battery Remaining Longevity: 114 mo
Battery Remaining Percentage: 100 %
Brady Statistic RA Percent Paced: 0 %
Brady Statistic RV Percent Paced: 1 %
Date Time Interrogation Session: 20200616091400
HighPow Impedance: 55 Ohm
Implantable Lead Implant Date: 20171106
Implantable Lead Implant Date: 20171106
Implantable Lead Location: 753859
Implantable Lead Location: 753860
Implantable Lead Model: 293
Implantable Lead Model: 7741
Implantable Lead Serial Number: 422759
Implantable Lead Serial Number: 828526
Implantable Pulse Generator Implant Date: 20171106
Lead Channel Impedance Value: 438 Ohm
Lead Channel Impedance Value: 602 Ohm
Lead Channel Pacing Threshold Amplitude: 0.8 V
Lead Channel Pacing Threshold Pulse Width: 0.4 ms
Lead Channel Setting Pacing Amplitude: 2 V
Lead Channel Setting Pacing Amplitude: 2.5 V
Lead Channel Setting Pacing Pulse Width: 0.4 ms
Lead Channel Setting Sensing Sensitivity: 0.5 mV
Pulse Gen Serial Number: 519130

## 2018-12-30 NOTE — Telephone Encounter (Signed)
New message   Pt c/o medication issue:  1. Name of Medication: Eliquis 5 mg  phenytoin (DILANTIN) 100 MG ER capsulephenytoin (DILANTIN) 100 MG ER capsule  2. How are you currently taking this medication (dosage and times per day)? Eliquis 1 time daily, Dilantin: 1 time daily at 8 pm  3. Are you having a reaction (difficulty breathing--STAT)? No   4. What is your medication issue? [name] daughter wants to know if can he can take these medications together.

## 2018-12-30 NOTE — Telephone Encounter (Signed)
We recommend against using this combination.  Best alternative for anticoagulation for [name] is warfarin due to potential drug-drug interaction with dilantin.  Recommend to contact prescribed for therapy adjustment

## 2018-12-30 NOTE — Telephone Encounter (Signed)
Forward question to [facility]  And will call patient daughter with response

## 2018-12-30 NOTE — Telephone Encounter (Signed)
Spoke to daughter .  She states patient is not taking the medication as of yet  She just wanted to know the interaction between the the too. She thanked the Therapist, sports for the information

## 2019-01-02 ENCOUNTER — Telehealth: Payer: Self-pay | Admitting: Cardiovascular Disease

## 2019-01-02 NOTE — Telephone Encounter (Signed)
[name] advised it is okay and will provide equal coverage if he is taking his Eliquis 12 hours apart. To be sure that with the change the pt does not have any missed doses being on a new schedule. She says 8am and 8pm will work well for them. Will call back if they have any problems.

## 2019-01-02 NOTE — Telephone Encounter (Signed)
  [name] is calling for [name] to ask if it would be okay to give him his Eliquis at 8am and 8pm instead of 8am and 4pm? The [place] thought it would be better for him to take at 8 and 8 but they wanted to check with Dr Gwenlyn Found before changing it.

## 2019-01-09 ENCOUNTER — Encounter: Payer: Self-pay | Admitting: Cardiology

## 2019-01-09 NOTE — Progress Notes (Signed)
Remote ICD transmission.   

## 2019-01-21 ENCOUNTER — Telehealth: Payer: Self-pay

## 2019-01-21 NOTE — Telephone Encounter (Signed)
LMTCB TO VERIFY IF PT WOULD LIKE TO KEEP 7/14  APPT WITH DR. Gwenlyn Found AND PRESENT IN THE OFFICE OR BE RESCHEDULED TO ANOTHER DAY ([facility] VIRTUAL DAYS) AND KEEP APPT TYPE AS VIRTUAL

## 2019-01-21 NOTE — Telephone Encounter (Signed)

## 2019-01-21 NOTE — Telephone Encounter (Signed)
Spoke with pt wife. Appt rescheduled to 7/21 at 1200. Reviewed virtual visit process and consent. Pt wife verbalized understanding

## 2019-01-21 NOTE — Telephone Encounter (Signed)
Wife of patient prefers virtual visit. Please let the wife know when the patient can have a virtual visit

## 2019-01-27 ENCOUNTER — Telehealth: Payer: Medicare Other | Admitting: Cardiovascular Disease

## 2019-01-27 ENCOUNTER — Telehealth: Payer: Self-pay

## 2019-01-27 ENCOUNTER — Telehealth: Payer: Self-pay | Admitting: Cardiovascular Disease

## 2019-01-27 MED ORDER — FUROSEMIDE 20 MG PO TABS: 20.0000 mg | ORAL_TABLET | Freq: Every day | ORAL | 3 refills | Status: AC

## 2019-01-27 NOTE — Telephone Encounter (Signed)
° °  Patient has had some fluid build up recently, and the daughter would like to know if the patient could be prescribed a fluid pill to help relieve the fluid. Another home health nurse recommended the lasix to help reduce the fluid buildup.   The patient has a virtual visit scheduled for next week, but the daughter would like to start the fluid pill prior to his virtual visit.   The daughter is also beginning the process to have a hospice nurse come to the house. She  states he is in his home , and does not want to bring the patient to the hospital, as he would probably not come home.

## 2019-01-27 NOTE — Telephone Encounter (Signed)
Daughter notified of MD advice. Rx(s) sent to pharmacy electronically. Hospice may manage his diuretic and follow up on labs. He is unable to get out to have BMET done in outside lab

## 2019-01-27 NOTE — Telephone Encounter (Signed)
Okay to start furosemide 20 mg a day, check a basic metabolic panel in 7 to 10 days.

## 2019-01-27 NOTE — Telephone Encounter (Signed)
Nurse received verbal request from Dr. Gwenlyn Found for OptiVol measurement for this pt. Instructed nurse to contact device clinic.

## 2019-01-27 NOTE — Telephone Encounter (Signed)
Called patient and spoke with daughter- she states that she would like her dad to be put on some type of diuretic- they have a family friend who is a nurse for 30 years that comes to check on her dad, and they have noticied an increase in swelling in his hands and feet- he has also put on weight, in December 2019 he was 148lb and weighted two weeks ago and was 175lbs. He is eating well, but they see the swelling, and state his abdominal area is tight as well. 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 They have a virtual scheduled 07/21 but she is suggesting to make the change now to see if any changes before the appointment.  Advised I would route to MD to advise. If [facility] is sent- [facility]

## 2019-01-28 NOTE — Telephone Encounter (Signed)
Pt has [place] - no thoracic impedance measurements available.  Chanetta Marshall, NP 01/28/2019 8:42 AM

## 2019-02-03 ENCOUNTER — Telehealth: Payer: Medicare Other | Admitting: Cardiovascular Disease

## 2019-02-14 NOTE — Telephone Encounter (Signed)
These to be evaluated with by an APP in the next week

## 2019-02-14 DEATH — deceased

## 2019-02-23 NOTE — Telephone Encounter (Signed)
Pt deceased

## 2019-05-25 IMAGING — CT CT HEAD W/O CM
3 series · 15 of 46 positions shown, 18 images · non-contrast
Comparison: CT scan of July 04, 2018.

CLINICAL DATA: Multiple falls.

EXAM:
CT HEAD WITHOUT CONTRAST
TECHNIQUE: Contiguous axial images were obtained from the base of the skull
through the vertex without intravenous contrast.

[Series 2: head wo · axial · 0.47mm/px · z∈[-142,-22]mm · 9 of 29 slices shown, 12 images]
[im 3/29  brain]
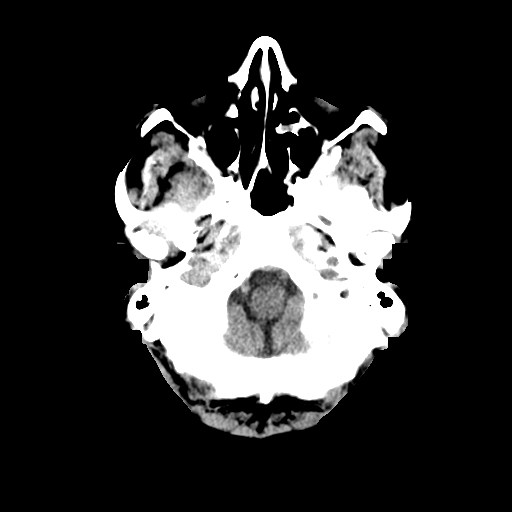
[im 3/29  bone]
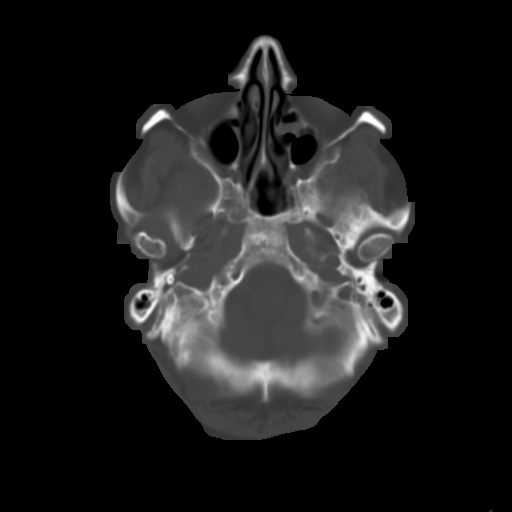
[im 6/29  brain]
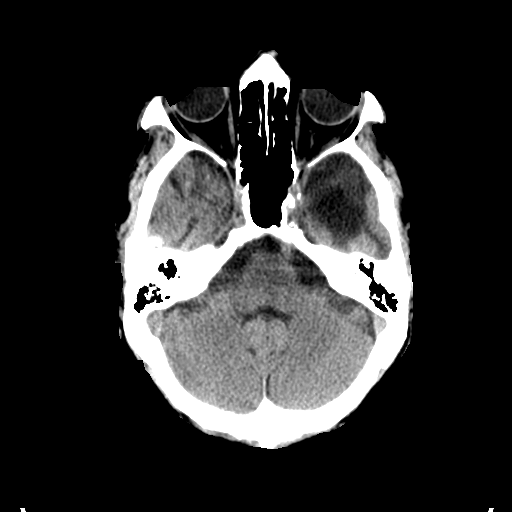
[im 9/29  brain]
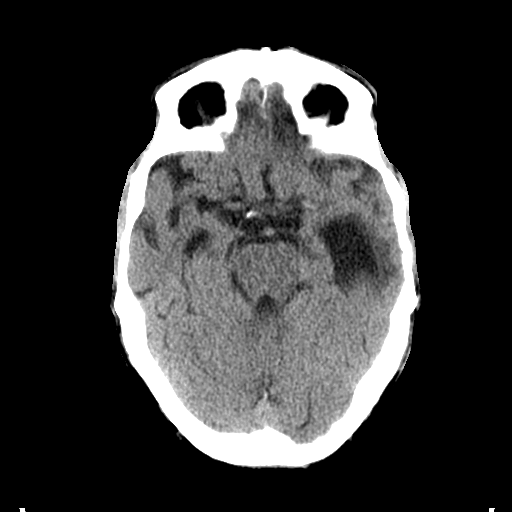
[im 12/29  brain]
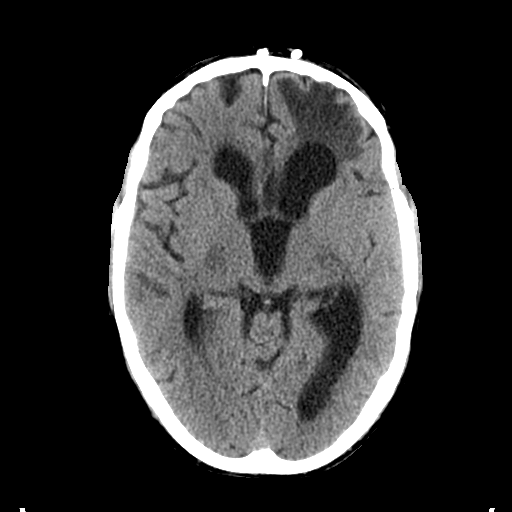
[im 15/29  brain]
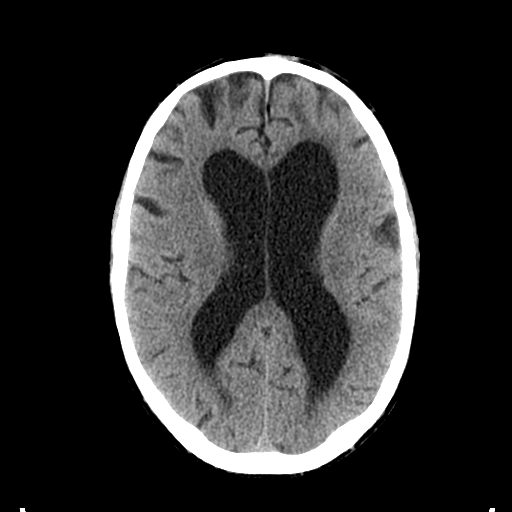
[im 15/29  bone]
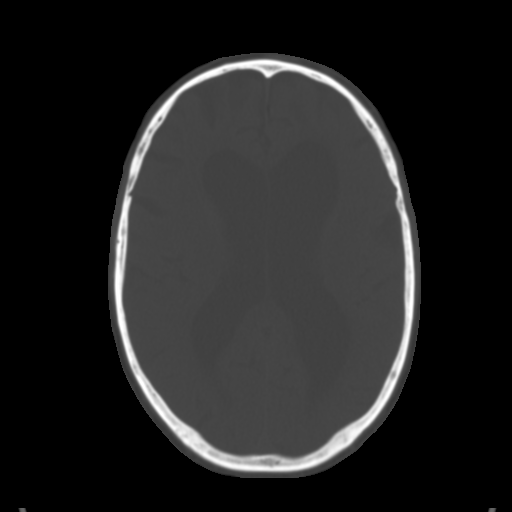
[im 18/29  brain]
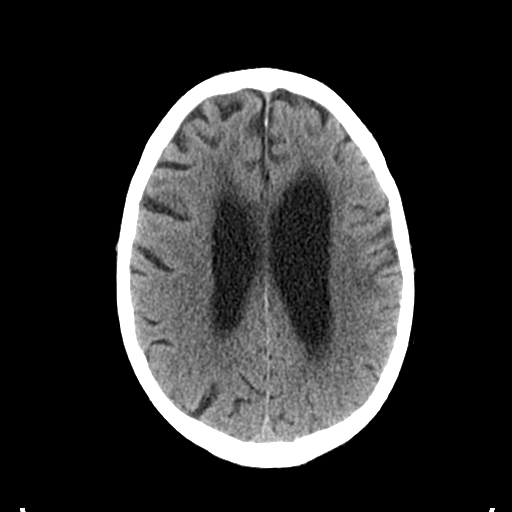
[im 21/29  brain]
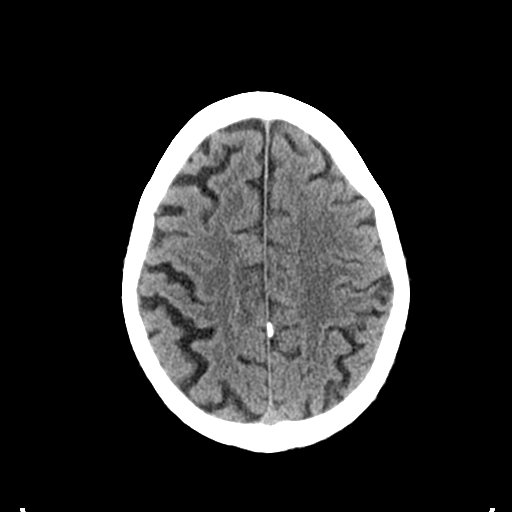
[im 24/29  brain]
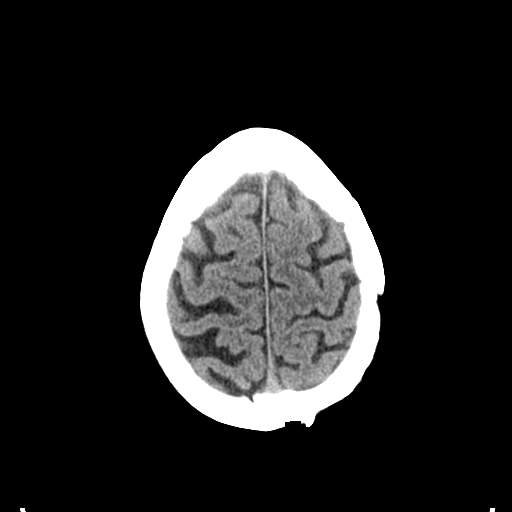
[im 27/29  brain]
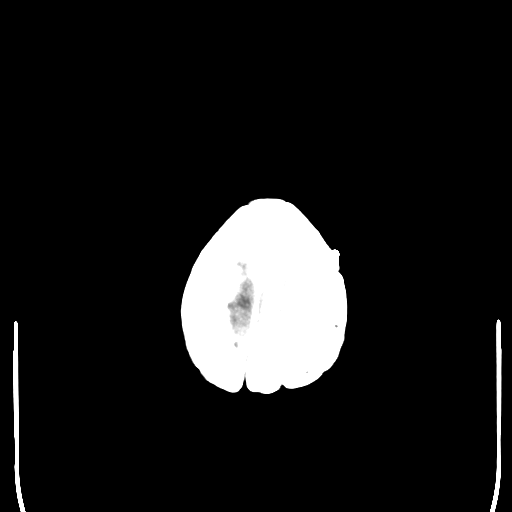
[im 27/29  bone]
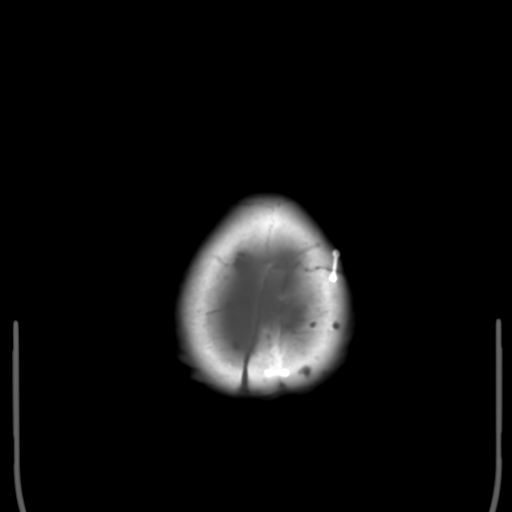

[Series 4: coronal soft tissue · coronal · 0.28mm/px · 3 of 73 slices shown]
[im 25/73  brain]
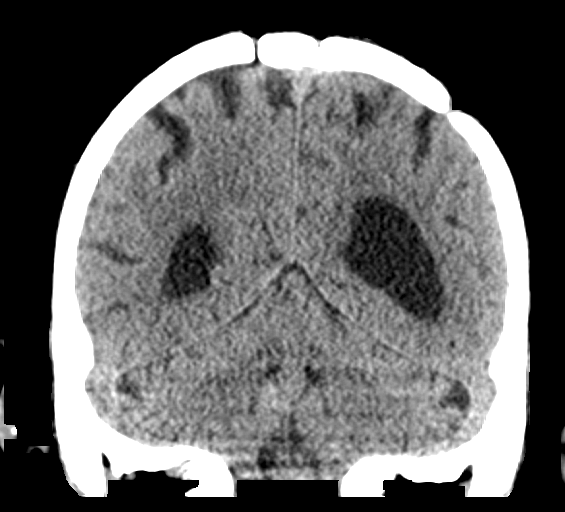
[im 33/73  brain]
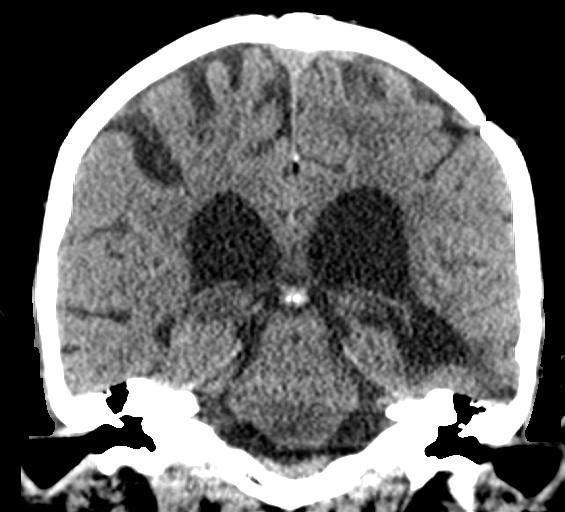
[im 41/73  brain]
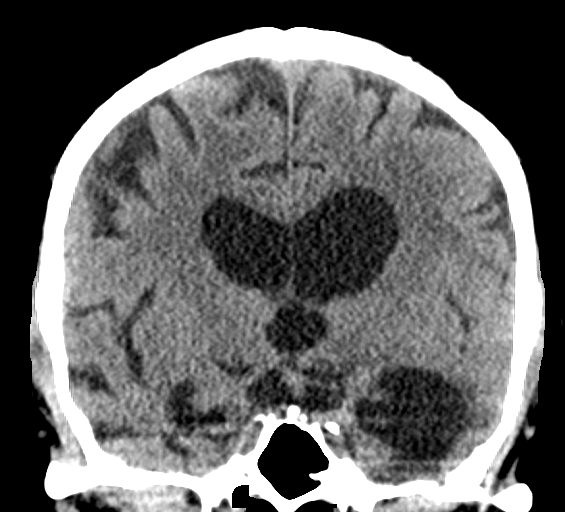

[Series 5: sagittal soft tissue · sagittal · 0.30mm/px · 3 of 55 slices shown]
[im 19/55  brain]
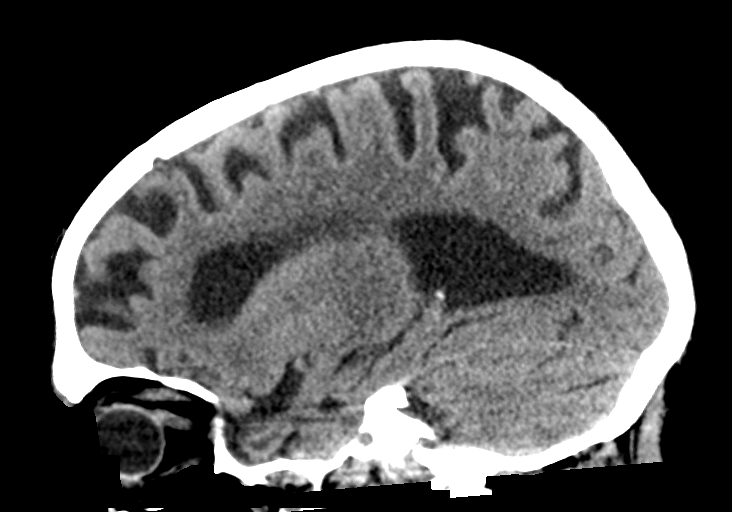
[im 28/55  brain]
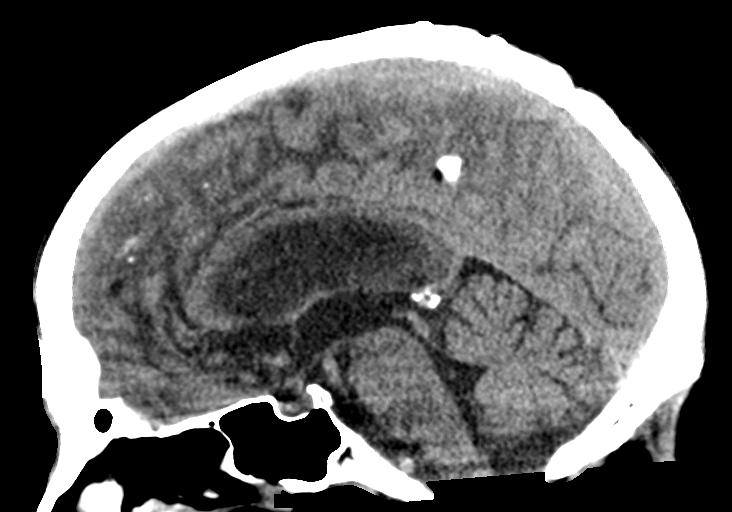
[im 37/55  brain]
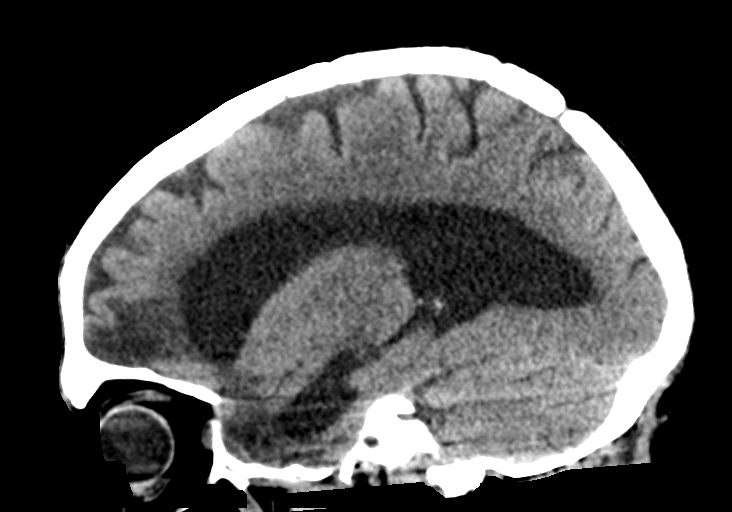

[15 of 46 positions shown; findings below may reference images not displayed]

FINDINGS: Brain: Left frontal and temporal encephalomalacia is again noted and
stable. Mild diffuse cortical atrophy is noted. Stable ventricular
dilatation is noted most likely due to atrophy. No hemorrhage or
acute infarction is noted. No mass lesion is noted. No midline shift
is noted.

Vascular: No hyperdense vessel or unexpected calcification.

Skull: Status post left posterior parietal craniotomy. No acute
abnormality is noted.

Sinuses/Orbits: No acute finding.

Other: None.
IMPRESSION: No acute intracranial abnormality seen. Stable chronic findings
compared to prior exam.

## 2019-05-27 IMAGING — DX DG CHEST 1V PORT
1 series · 1 of 1 positions shown · non-contrast
Comparison: PA and lateral chest 07/04/2018 and 06/11/2017.

CLINICAL DATA: Seizures today.

EXAM:
PORTABLE CHEST 1 VIEW

[chest ap]
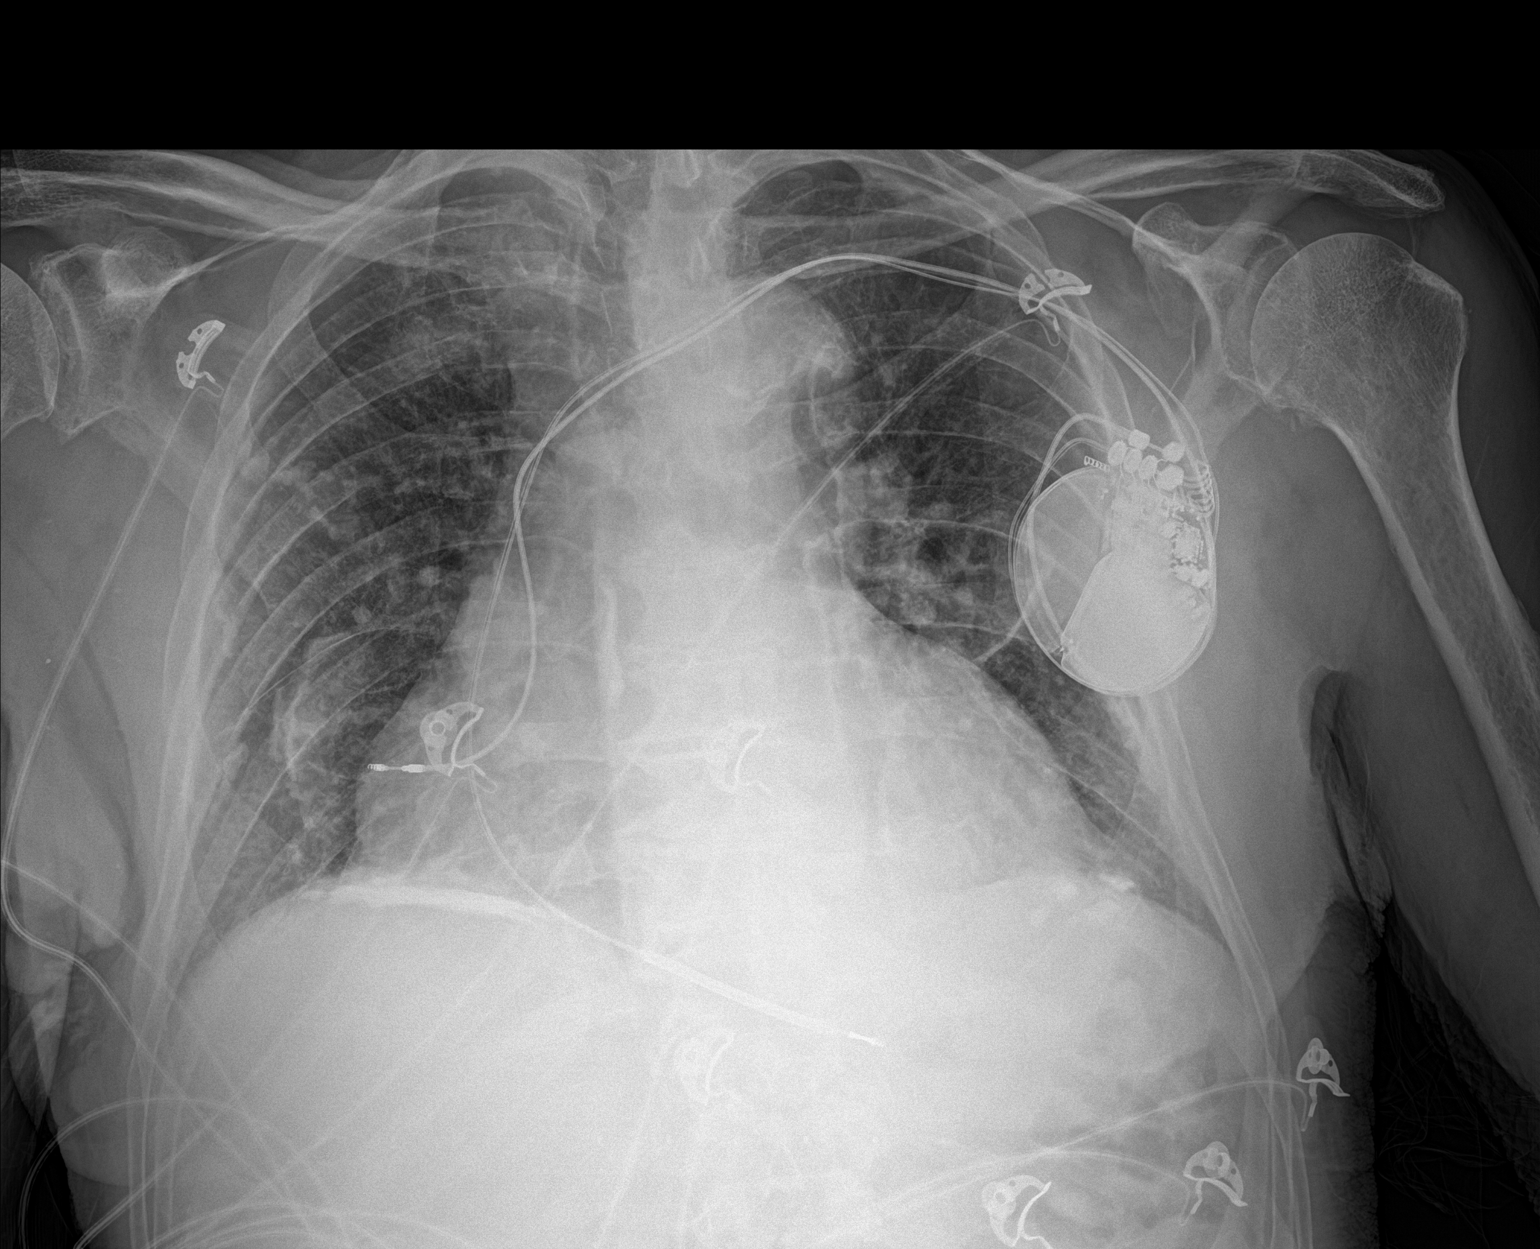

[1 of 1 positions shown; findings below may reference images not displayed]

FINDINGS: There is cardiomegaly. Pacing device in place. No pneumothorax or
pleural effusion. Calcified pleural plaques are noted. No acute or
focal bony abnormality.
IMPRESSION: Cardiomegaly without acute disease.
